# Patient Record
Sex: Female | Born: 1950 | Race: White | Hispanic: No | Smoking: Former smoker
Health system: Southern US, Community
[De-identification: ages and names within clinical notes are randomized; demographics above are authoritative.]

## PROBLEM LIST (undated history)

## (undated) DIAGNOSIS — F329 Major depressive disorder, single episode, unspecified: Secondary | ICD-10-CM

## (undated) DIAGNOSIS — I499 Cardiac arrhythmia, unspecified: Secondary | ICD-10-CM

## (undated) DIAGNOSIS — R51 Headache: Secondary | ICD-10-CM

## (undated) DIAGNOSIS — G8929 Other chronic pain: Secondary | ICD-10-CM

## (undated) DIAGNOSIS — H9190 Unspecified hearing loss, unspecified ear: Secondary | ICD-10-CM

## (undated) DIAGNOSIS — E119 Type 2 diabetes mellitus without complications: Secondary | ICD-10-CM

## (undated) DIAGNOSIS — F32A Depression, unspecified: Secondary | ICD-10-CM

## (undated) DIAGNOSIS — K219 Gastro-esophageal reflux disease without esophagitis: Secondary | ICD-10-CM

## (undated) DIAGNOSIS — F419 Anxiety disorder, unspecified: Secondary | ICD-10-CM

## (undated) DIAGNOSIS — R519 Headache, unspecified: Secondary | ICD-10-CM

## (undated) DIAGNOSIS — K589 Irritable bowel syndrome without diarrhea: Secondary | ICD-10-CM

## (undated) HISTORY — DX: Headache, unspecified: R51.9

## (undated) HISTORY — DX: Irritable bowel syndrome without diarrhea: K58.9

## (undated) HISTORY — DX: Type 2 diabetes mellitus without complications: E11.9

## (undated) HISTORY — PX: CHOLECYSTECTOMY: SHX55

## (undated) HISTORY — DX: Headache: R51

## (undated) HISTORY — DX: Gastro-esophageal reflux disease without esophagitis: K21.9

## (undated) HISTORY — DX: Major depressive disorder, single episode, unspecified: F32.9

## (undated) HISTORY — DX: Cardiac arrhythmia, unspecified: I49.9

## (undated) HISTORY — DX: Other chronic pain: G89.29

## (undated) HISTORY — DX: Depression, unspecified: F32.A

## (undated) HISTORY — PX: ABDOMINAL HYSTERECTOMY: SUR658

## (undated) HISTORY — DX: Unspecified hearing loss, unspecified ear: H91.90

## (undated) HISTORY — DX: Anxiety disorder, unspecified: F41.9

---

## 1984-01-20 DIAGNOSIS — K589 Irritable bowel syndrome without diarrhea: Secondary | ICD-10-CM

## 1984-01-20 HISTORY — DX: Irritable bowel syndrome, unspecified: K58.9

## 2010-02-21 ENCOUNTER — Emergency Department (HOSPITAL_COMMUNITY)
Admission: EM | Admit: 2010-02-21 | Discharge: 2010-02-21 | Disposition: A | Discharge status: Home / Self Care | Payer: Self-pay | Attending: Emergency Medicine | Admitting: Emergency Medicine

## 2010-02-21 DIAGNOSIS — R059 Cough, unspecified: Secondary | ICD-10-CM | POA: Insufficient documentation

## 2010-02-21 DIAGNOSIS — H5789 Other specified disorders of eye and adnexa: Secondary | ICD-10-CM | POA: Insufficient documentation

## 2010-02-21 DIAGNOSIS — Z8673 Personal history of transient ischemic attack (TIA), and cerebral infarction without residual deficits: Secondary | ICD-10-CM | POA: Insufficient documentation

## 2010-02-21 DIAGNOSIS — J329 Chronic sinusitis, unspecified: Secondary | ICD-10-CM | POA: Insufficient documentation

## 2010-02-21 DIAGNOSIS — J3489 Other specified disorders of nose and nasal sinuses: Secondary | ICD-10-CM | POA: Insufficient documentation

## 2010-02-21 DIAGNOSIS — H1130 Conjunctival hemorrhage, unspecified eye: Secondary | ICD-10-CM | POA: Insufficient documentation

## 2010-02-21 DIAGNOSIS — R05 Cough: Secondary | ICD-10-CM | POA: Insufficient documentation

## 2011-04-03 ENCOUNTER — Encounter: Payer: Self-pay | Admitting: Internal Medicine

## 2011-04-06 ENCOUNTER — Encounter: Payer: Self-pay | Admitting: Internal Medicine

## 2011-04-07 ENCOUNTER — Encounter: Payer: Self-pay | Admitting: Internal Medicine

## 2011-04-08 ENCOUNTER — Encounter: Payer: Self-pay | Admitting: Internal Medicine

## 2011-04-08 ENCOUNTER — Ambulatory Visit (INDEPENDENT_AMBULATORY_CARE_PROVIDER_SITE_OTHER): Payer: Medicare Other | Admitting: Internal Medicine

## 2011-04-08 DIAGNOSIS — R1030 Lower abdominal pain, unspecified: Secondary | ICD-10-CM

## 2011-04-08 DIAGNOSIS — K219 Gastro-esophageal reflux disease without esophagitis: Secondary | ICD-10-CM

## 2011-04-08 DIAGNOSIS — R198 Other specified symptoms and signs involving the digestive system and abdomen: Secondary | ICD-10-CM

## 2011-04-08 DIAGNOSIS — R519 Headache, unspecified: Secondary | ICD-10-CM | POA: Insufficient documentation

## 2011-04-08 DIAGNOSIS — K921 Melena: Secondary | ICD-10-CM

## 2011-04-08 DIAGNOSIS — Z90710 Acquired absence of both cervix and uterus: Secondary | ICD-10-CM | POA: Insufficient documentation

## 2011-04-08 DIAGNOSIS — R194 Change in bowel habit: Secondary | ICD-10-CM

## 2011-04-08 DIAGNOSIS — R109 Unspecified abdominal pain: Secondary | ICD-10-CM

## 2011-04-08 DIAGNOSIS — R1013 Epigastric pain: Secondary | ICD-10-CM

## 2011-04-08 HISTORY — DX: Headache, unspecified: R51.9

## 2011-04-08 MED ORDER — PEG-KCL-NACL-NASULF-NA ASC-C 100 G PO SOLR: 1.0000 | Freq: Once | ORAL | Status: DC

## 2011-04-08 MED ORDER — HYOSCYAMINE SULFATE 0.125 MG SL SUBL: 0.1250 mg | SUBLINGUAL_TABLET | SUBLINGUAL | Status: DC | PRN

## 2011-04-08 NOTE — Patient Instructions (Addendum)
You have been given a separate informational sheet regarding your tobacco use, the importance of quitting and local resources to help you quit.  You have been scheduled for a colonoscopy/endoscopy with propofol. Please follow written instructions given to you at your visit today.  Please pick up your prep kit at the pharmacy within the next 1-3 days.  You have been scheduled for a CT scan of the abdomen and pelvis at [facility] ([address]---this is in the same building as [facility]).   You are scheduled on 04/13/2011 at 10:00am. You should arrive 15 minutes prior to your appointment time for registration. Please follow the written instructions below on the day of your exam:  WARNING: IF YOU ARE ALLERGIC TO IODINE/X-RAY DYE, PLEASE NOTIFY RADIOLOGY IMMEDIATELY AT [phone]! YOU WILL BE GIVEN A 13 HOUR PREMEDICATION PREP.  1) Do not eat or drink anything after 5:00am (4 hours prior to your test) 2) You have been given 2 bottles of oral contrast to drink. The solution may taste better if refrigerated, but do NOT add ice or any other liquid to this solution. Shake well before drinking.    Drink 1 bottle of contrast @ 8:00am (2 hours prior to your exam)  Drink 1 bottle of contrast @ 9:00am (1 hour prior to your exam)  You may take any medications as prescribed with a small amount of water except for the following: Metformin, Glucophage, Glucovance, Avandamet, Riomet, Fortamet, Actoplus Met, Janumet, Glumetza or Metaglip. The above medications must be held the day of the exam AND 48 hours after the exam.  The purpose of you drinking the oral contrast is to aid in the visualization of your intestinal tract. The contrast solution may cause some diarrhea. Before your exam is started, you will be given a small amount of fluid to drink. Depending on your individual set of symptoms, you may also receive an intravenous injection of x-ray contrast/dye. Plan on being at [facility] for 30 minutes or long, depending on the type of exam you are having performed.  If you have any questions regarding your exam or if you need to reschedule, you may call the CT department at [phone] between the hours of 8:00 am and 5:00 pm, Monday-Friday.  We have sent the following medications to your pharmacy for you to pick up at your convenience: Levsin   ________________________________________________________________________

## 2011-04-08 NOTE — Progress Notes (Signed)
Subjective:    Patient ID: Rhonda Fields, female    DOB: [birth year], 61 y.o.   MRN: [id number]  HPI 61 yo female with PMH of TAH/SBO who is seen in consultation at the request of Dr. Tanya Nones for evaluation of abdominal pain and hematochezia.  The patient reports developing crampy middle and lower abdominal pain over the last 4-6 weeks. This has been associated with a change in bowel habits and rectal bleeding. She reports having frequent, soft stools occurring almost after every meal. This seems to be happening 6-8 times daily, but happens less frequently if she is not eating. She reports frequent red blood and occasionally clots nurse stool. She is seen red blood on the toilet tissue as well. She reports increased lower gas and some fecal seepage. She reports this has limited her desire to leave her home.  Prior to the symptoms she was having one bowel movement every other day. Regarding the abdominal cramping, it is worse after eating and occasionally wakes her up at night. She is also having an epigastric pain which radiates into her chest. She describes this as a burning sensation, again worse at night. This was initially treated with Dexilant, which she said helped a lot, however Medicaid would not pay for this medication.  She was subsequently changed to omeprazole 40 mg daily, and this seems to lose its efficacy in the evening and night.  No dysphagia or odynophagia. No fevers or chills. Appetite comes and goes.  She reports a recent history of being diagnosed with H. pylori, presumably by serum testing, and completed 7 days of antibiotic therapy. She also underwent rectal exam with anoscopy by her PCP which was reportedly negative for hemorrhoids, fissures.  Review of Systems  as per history of present illness, also positive for anxiety, fatigue, back pain, occasional headaches, occasional muscle cramps, difficulty with sleep, urinary frequency  Past Medical History  Diagnosis Date  .  Anxiety   . Arrhythmia   . Chronic headaches   . Depression   . IBS (irritable bowel syndrome) 1986   Past Surgical History  Procedure Date  . Possible Cholecystectomy   . Abdominal hysterectomy    Current Outpatient Prescriptions  Medication Sig Dispense Refill  . omeprazole (PRILOSEC) 40 MG capsule Take 40 mg by mouth daily.      . hyoscyamine (LEVSIN/SL) 0.125 MG SL tablet Place 1 tablet (0.125 mg total) under the tongue every 4 (four) hours as needed for cramping.  30 tablet  2  . peg 3350 powder (MOVIPREP) 100 G SOLR Take 1 kit (100 g total) by mouth once.  1 kit  0   Allergies  Allergen Reactions  . Penicillins    Family History  Problem Relation Age of Onset  . Diabetes      Siblings, both sets of grandparents  . Liver disease Mother   . Liver disease Brother   . Kidney disease Mother    Social History  . Marital Status: Widowed    Number of Children: 4   Occupational History  . Unemployed    Social History Main Topics  . Smoking status: Current Everyday Smoker    Types: Cigarettes  . Smokeless tobacco: Never Used  . Alcohol Use: No  . Drug Use: No      Objective:   Physical Exam BP 138/90  Pulse 88  Ht 5' 3.5" (1.613 m)  Wt 161 lb 6.4 oz (73.211 kg)  BMI 28.14 kg/m2 Constitutional: Well-developed and well-nourished. No distress. HEENT:  Normocephalic and atraumatic. Oropharynx is clear and moist. No oropharyngeal exudate. Conjunctivae are normal. Pupils are equal round and reactive to light. No scleral icterus. Neck: Neck supple. Trachea midline. Cardiovascular: Normal rate, regular rhythm and intact distal pulses. No M/R/G Pulmonary/chest: Effort normal and breath sounds normal. No wheezing, rales or rhonchi. Abdominal: Soft, obese, midline abdominal scar from xiphoid to suprapubic abdomen well-healed, tender to palpation epigastrium and across the middle abdomen without rebound or guarding, nondistended. Bowel sounds active throughout. There are no  masses palpable. No hepatosplenomegaly. Extremities: no clubbing, cyanosis, or edema Lymphadenopathy: No cervical adenopathy noted. Neurological: Alert and oriented to person place and time. Skin: Skin is warm and dry. No rashes noted. Psychiatric: Normal mood and affect. Behavior is normal.  Labs date 03/28/2011 Sedimentation rate 5, B12 471  Labs date 03/20/2011 CBC: WBC 10.5, hemoglobin 14.7, hematocrit 42.4, MCV 90.0, platelet 286 Sodium 138, potassium 3.9, chloride 105, carbon dioxide 20, BUN 6, creatinine 0.74, glucose 191 Total bili 0.4, alkaline phosphatase 104, AST 68, ALT 76 Total protein 7.2, albumin 4.2, calcium 8.9, TSH 0.802 (WNL)    Assessment & Plan:  61 yo female with PMH of TAH/SBO who is seen in consultation at the request of Dr. Tanya Nones for evaluation of abdominal pain and hematochezia  1. Abd pain/hematochezia/change in bowel habits/GERD -- the patient has had a dramatic change in bowel habits with frequent rectal bleeding over the last 4-6 weeks. She apparently has upper epigastric type pain which may be acid related, but also lower cramping type abdominal pain. Her labs, which I reviewed, are reassuring, though she did have mild elevations in his transaminases. She does not recall cross-sectional imaging of her liver, but perhaps this is secondary to fatty liver disease. For now I recommend CT scan abdomen and pelvis with contrast to better evaluate her current symptoms. Then I feel she should proceed with EGD and colonoscopy to evaluate her epigastric pain, lower abdominal pain, and hematochezia. Inflammatory bowel disease is in the differential, and colonoscopy should help sort this out. We discussed these tests including the risks, benefits, and alternatives, and she is agreeable to proceed. They will be scheduled for her with propofol. In the meantime, I would like to add a second daily dose of omeprazole 40 mg. He does seem she is having late day symptoms, and perhaps  needs twice daily PPI. Also will prescribe Levsin to be used as needed and as directed for abdominal pain/spasm. I have asked that she contact us if symptoms worsen as she is waiting on her procedure.

## 2011-04-09 ENCOUNTER — Telehealth: Payer: Self-pay | Admitting: Internal Medicine

## 2011-04-09 NOTE — Telephone Encounter (Signed)
Pt reports the co pay for Movi Prep is >$65 and she can't afford it; hyoscyamine is $28. Informed pt the $28 is quite low got a GI med and if  I could find her a Movi Prep can she pay the $28? Pt stated yes and sample left at the front desk.

## 2011-04-13 ENCOUNTER — Ambulatory Visit (INDEPENDENT_AMBULATORY_CARE_PROVIDER_SITE_OTHER)
Admission: RE | Admit: 2011-04-13 | Discharge: 2011-04-13 | Disposition: A | Discharge status: Home / Self Care | Payer: Medicare Other | Source: Ambulatory Visit | Attending: Internal Medicine | Admitting: Internal Medicine

## 2011-04-13 ENCOUNTER — Other Ambulatory Visit: Payer: Self-pay | Admitting: Internal Medicine

## 2011-04-13 DIAGNOSIS — K219 Gastro-esophageal reflux disease without esophagitis: Secondary | ICD-10-CM

## 2011-04-13 DIAGNOSIS — K921 Melena: Secondary | ICD-10-CM

## 2011-04-13 DIAGNOSIS — R109 Unspecified abdominal pain: Secondary | ICD-10-CM

## 2011-04-13 MED ORDER — IOHEXOL 300 MG/ML  SOLN
100.0000 mL | Freq: Once | INTRAMUSCULAR | Status: AC | PRN
  Administered 2011-04-13: 100 mL via INTRAVENOUS

## 2011-04-14 ENCOUNTER — Other Ambulatory Visit: Payer: Self-pay | Admitting: Gastroenterology

## 2011-04-14 ENCOUNTER — Telehealth: Payer: Self-pay

## 2011-04-14 DIAGNOSIS — K8020 Calculus of gallbladder without cholecystitis without obstruction: Secondary | ICD-10-CM

## 2011-04-14 NOTE — Telephone Encounter (Signed)
Pt scheduled for ERCP with [facility] with Dr. Arlyce Dice 04/23/11 @1pm . Case [phone]. Pt to have no solid food or milk after 12mn. Pt to have clear liquids after midnight, may have clear liquids until 9am 04/23/11.

## 2011-04-15 ENCOUNTER — Telehealth: Payer: Self-pay | Admitting: Gastroenterology

## 2011-04-15 ENCOUNTER — Telehealth: Payer: Self-pay

## 2011-04-15 ENCOUNTER — Other Ambulatory Visit: Payer: Self-pay | Admitting: Internal Medicine

## 2011-04-15 NOTE — Telephone Encounter (Signed)
Pt could not take the appt at [facility] for ERCP. Pt scheduled at [facility] 04/20/11 in the OR at 2:30pm for ERCP with [facility]. Case [phone]. Scheduled with Meriam Sprague in the OR.

## 2011-04-15 NOTE — Telephone Encounter (Signed)
Pt had questions her daughter asked about the procedure. Answered questions on exactly what Dr Arlyce Dice was doing , where the stone is, why the COLON isn't being done on the same day; etc. Pt stated understanding.

## 2011-04-16 ENCOUNTER — Encounter (HOSPITAL_COMMUNITY): Payer: Self-pay | Admitting: Pharmacy Technician

## 2011-04-20 ENCOUNTER — Encounter (HOSPITAL_COMMUNITY): Payer: Self-pay | Admitting: Anesthesiology

## 2011-04-20 ENCOUNTER — Ambulatory Visit (HOSPITAL_COMMUNITY)
Admission: RE | Admit: 2011-04-20 | Discharge: 2011-04-20 | Disposition: A | Discharge status: Home / Self Care | Payer: Medicare Other | Source: Ambulatory Visit | Attending: Gastroenterology | Admitting: Gastroenterology

## 2011-04-20 ENCOUNTER — Encounter (HOSPITAL_COMMUNITY)
Admission: RE | Disposition: A | Discharge status: Home / Self Care | Payer: Self-pay | Source: Ambulatory Visit | Attending: Gastroenterology

## 2011-04-20 ENCOUNTER — Encounter (HOSPITAL_COMMUNITY): Payer: Self-pay | Admitting: Gastroenterology

## 2011-04-20 ENCOUNTER — Encounter (HOSPITAL_COMMUNITY): Payer: Self-pay | Admitting: *Deleted

## 2011-04-20 ENCOUNTER — Other Ambulatory Visit: Payer: Self-pay

## 2011-04-20 ENCOUNTER — Ambulatory Visit (HOSPITAL_COMMUNITY): Payer: Medicare Other | Admitting: *Deleted

## 2011-04-20 ENCOUNTER — Ambulatory Visit (HOSPITAL_COMMUNITY): Payer: Medicare Other

## 2011-04-20 DIAGNOSIS — K589 Irritable bowel syndrome without diarrhea: Secondary | ICD-10-CM | POA: Insufficient documentation

## 2011-04-20 DIAGNOSIS — R932 Abnormal findings on diagnostic imaging of liver and biliary tract: Secondary | ICD-10-CM

## 2011-04-20 DIAGNOSIS — Z79899 Other long term (current) drug therapy: Secondary | ICD-10-CM | POA: Insufficient documentation

## 2011-04-20 DIAGNOSIS — R1013 Epigastric pain: Secondary | ICD-10-CM

## 2011-04-20 DIAGNOSIS — K219 Gastro-esophageal reflux disease without esophagitis: Secondary | ICD-10-CM | POA: Insufficient documentation

## 2011-04-20 DIAGNOSIS — K8020 Calculus of gallbladder without cholecystitis without obstruction: Secondary | ICD-10-CM

## 2011-04-20 DIAGNOSIS — Z01810 Encounter for preprocedural cardiovascular examination: Secondary | ICD-10-CM | POA: Insufficient documentation

## 2011-04-20 DIAGNOSIS — K8050 Calculus of bile duct without cholangitis or cholecystitis without obstruction: Secondary | ICD-10-CM

## 2011-04-20 HISTORY — PX: ERCP: SHX5425

## 2011-04-20 SURGERY — ERCP, WITH INTERVENTION IF INDICATED
Anesthesia: Monitor Anesthesia Care

## 2011-04-20 SURGERY — ERCP, WITH INTERVENTION IF INDICATED
Anesthesia: General

## 2011-04-20 MED ORDER — SODIUM CHLORIDE 0.9 % IV SOLN
INTRAVENOUS | Status: DC | PRN
  Administered 2011-04-20: 17:00:00

## 2011-04-20 MED ORDER — ONDANSETRON HCL 4 MG/2ML IJ SOLN
INTRAMUSCULAR | Status: DC | PRN
  Administered 2011-04-20 (×2): 2 mg via INTRAVENOUS

## 2011-04-20 MED ORDER — MIDAZOLAM HCL 5 MG/5ML IJ SOLN
INTRAMUSCULAR | Status: DC | PRN
  Administered 2011-04-20 (×2): 1 mg via INTRAVENOUS

## 2011-04-20 MED ORDER — NEOSTIGMINE METHYLSULFATE 1 MG/ML IJ SOLN
INTRAMUSCULAR | Status: DC | PRN
  Administered 2011-04-20: 1 mg via INTRAVENOUS

## 2011-04-20 MED ORDER — CIPROFLOXACIN IN D5W 400 MG/200ML IV SOLN: 400.0000 mg | Freq: Once | INTRAVENOUS | Status: DC

## 2011-04-20 MED ORDER — CISATRACURIUM BESYLATE 2 MG/ML IV SOLN
INTRAVENOUS | Status: DC | PRN
  Administered 2011-04-20: 5 mg via INTRAVENOUS

## 2011-04-20 MED ORDER — CIPROFLOXACIN IN D5W 400 MG/200ML IV SOLN
INTRAVENOUS | Status: DC | PRN
  Administered 2011-04-20: 100 mg via INTRAVENOUS

## 2011-04-20 MED ORDER — PROPOFOL 10 MG/ML IV EMUL
INTRAVENOUS | Status: DC | PRN
  Administered 2011-04-20: 175 mg via INTRAVENOUS
  Administered 2011-04-20: 25 mg via INTRAVENOUS

## 2011-04-20 MED ORDER — SUCCINYLCHOLINE CHLORIDE 20 MG/ML IJ SOLN
INTRAMUSCULAR | Status: DC | PRN
  Administered 2011-04-20: 100 mg via INTRAVENOUS

## 2011-04-20 MED ORDER — GLYCOPYRROLATE 0.2 MG/ML IJ SOLN
INTRAMUSCULAR | Status: DC | PRN
  Administered 2011-04-20: 0.2 mg via INTRAVENOUS

## 2011-04-20 MED ORDER — LIDOCAINE HCL (CARDIAC) 20 MG/ML IV SOLN
INTRAVENOUS | Status: DC | PRN
  Administered 2011-04-20: 50 mg via INTRAVENOUS

## 2011-04-20 MED ORDER — FENTANYL CITRATE 0.05 MG/ML IJ SOLN: 25.0000 ug | INTRAMUSCULAR | Status: DC | PRN

## 2011-04-20 MED ORDER — LACTATED RINGERS IV SOLN: INTRAVENOUS | Status: DC

## 2011-04-20 MED ORDER — CIPROFLOXACIN IN D5W 400 MG/200ML IV SOLN
INTRAVENOUS | Status: AC
  Filled 2011-04-20: qty 200

## 2011-04-20 MED ORDER — LACTATED RINGERS IV SOLN
INTRAVENOUS | Status: DC | PRN
  Administered 2011-04-20 (×2): via INTRAVENOUS

## 2011-04-20 MED ORDER — LACTATED RINGERS IV SOLN
INTRAVENOUS | Status: DC
  Administered 2011-04-20: 1000 mL via INTRAVENOUS

## 2011-04-20 MED ORDER — DEXAMETHASONE SODIUM PHOSPHATE 10 MG/ML IJ SOLN
INTRAMUSCULAR | Status: DC | PRN
  Administered 2011-04-20: 10 mg via INTRAVENOUS

## 2011-04-20 MED ORDER — DIPHENHYDRAMINE HCL 50 MG/ML IJ SOLN
INTRAMUSCULAR | Status: DC | PRN
  Administered 2011-04-20: 25 mg via INTRAVENOUS

## 2011-04-20 MED ORDER — FENTANYL CITRATE 0.05 MG/ML IJ SOLN
INTRAMUSCULAR | Status: DC | PRN
  Administered 2011-04-20: 100 ug via INTRAVENOUS

## 2011-04-20 MED ORDER — PROMETHAZINE HCL 25 MG/ML IJ SOLN: 6.2500 mg | INTRAMUSCULAR | Status: DC | PRN

## 2011-04-20 NOTE — Anesthesia Preprocedure Evaluation (Signed)
Anesthesia Evaluation  Patient identified by MRN, date of birth, ID band Patient awake    Reviewed: Allergy & Precautions, H&P , NPO status , Patient's Chart, lab work & pertinent test results  History of Anesthesia Complications Negative for: history of anesthetic complications  Airway Mallampati: II TM Distance: >3 FB Neck ROM: Full    Dental  (+) Edentulous Upper and Edentulous Lower   Pulmonary asthma , Current Smoker,  breath sounds clear to auscultation  Pulmonary exam normal       Cardiovascular negative cardio ROS  + dysrhythmias + Valvular Problems/Murmurs Rhythm:Regular Rate:Normal     Neuro/Psych  Headaches, Anxiety Depression negative neurological ROS     GI/Hepatic negative GI ROS, Neg liver ROS,   Endo/Other  negative endocrine ROS  Renal/GU negative Renal ROS  negative genitourinary   Musculoskeletal negative musculoskeletal ROS (+)   Abdominal   Peds negative pediatric ROS (+)  Hematology negative hematology ROS (+)   Anesthesia Other Findings   Reproductive/Obstetrics negative OB ROS                           Anesthesia Physical Anesthesia Plan  ASA: II  Anesthesia Plan: General   Post-op Pain Management:    Induction: Intravenous  Airway Management Planned: Oral ETT  Additional Equipment:   Intra-op Plan:   Post-operative Plan: Extubation in OR  Informed Consent: I have reviewed the patients History and Physical, chart, labs and discussed the procedure including the risks, benefits and alternatives for the proposed anesthesia with the patient or authorized representative who has indicated his/her understanding and acceptance.   Dental advisory given  Plan Discussed with: CRNA  Anesthesia Plan Comments:         Anesthesia Quick Evaluation

## 2011-04-20 NOTE — Interval H&P Note (Signed)
History and Physical Interval Note:  04/20/2011 3:11 PM  Tekila Caillouet  has presented today for surgery, with the diagnosis of abdominal pain vomiting  The various methods of treatment have been discussed with the patient and family. After consideration of risks, benefits and other options for treatment, the patient has consented to  Procedure(s) (LRB): ENDOSCOPIC RETROGRADE CHOLANGIOPANCREATOGRAPHY (ERCP) (N/A) as a surgical intervention .  The patients' history has been reviewed, patient examined, no change in status, stable for surgery.  I have reviewed the patients' chart and labs.  Questions were answered to the patient's satisfaction.    The recent H&P (dated *04/08/11**) was reviewed, the patient was examined and there is no change in the patients condition since that H&P was completed.  CT scan demonstrated dilatation of the distal common bile duct with a distal common bile duct stone.  Plan ERCP The risks, benefits, and possible complications of the procedure, including bleeding, perforation, surgery, and the 5-10% risk for pancreatitis, were explained to the patient.  Patient's questions were answered.    [name]  04/20/2011, 3:12 PM    [name]

## 2011-04-20 NOTE — Transfer of Care (Signed)
Immediate Anesthesia Transfer of Care Note  Patient: Rhonda Fields  Procedure(s) Performed: Procedure(s) (LRB): ENDOSCOPIC RETROGRADE CHOLANGIOPANCREATOGRAPHY (ERCP) (N/A)  Patient Location: PACU  Anesthesia Type: General  Level of Consciousness: awake, oriented and patient cooperative  Airway & Oxygen Therapy: Patient Spontanous Breathing and Patient connected to face mask oxygen  Post-op Assessment: Report given to PACU RN, Post -op Vital signs reviewed and stable and Patient moving all extremities  Post vital signs: Reviewed and stable  Complications: No apparent anesthesia complications

## 2011-04-20 NOTE — Discharge Instructions (Signed)
Endoscopy Care After Please read the instructions outlined below and refer to this sheet in the next few weeks. These discharge instructions provide you with general information on caring for yourself after you leave the hospital. Your doctor may also give you specific instructions. While your treatment has been planned according to the most current medical practices available, unavoidable complications occasionally occur. If you have any problems or questions after discharge, please call your doctor. HOME CARE INSTRUCTIONS Activity  You may resume your regular activity but move at a slower pace for the next 24 hours.   Take frequent rest periods for the next 24 hours.   Walking will help expel (get rid of) the air and reduce the bloated feeling in your abdomen.   No driving for 24 hours (because of the anesthesia (medicine) used during the test).   You may shower.   Do not sign any important legal documents or operate any machinery for 24 hours (because of the anesthesia used during the test).  Nutrition  Drink plenty of fluids.   You may resume your normal diet.   Begin with a light meal and progress to your normal diet.   Avoid alcoholic beverages for 24 hours or as instructed by your caregiver.  Medications You may resume your normal medications unless your caregiver tells you otherwise. What you can expect today  You may experience abdominal discomfort such as a feeling of fullness or "gas" pains.   You may experience a sore throat for 2 to 3 days. This is normal. Gargling with salt water may help this.  Follow-up Your doctor will discuss the results of your test with you. SEEK IMMEDIATE MEDICAL CARE IF:  You have excessive nausea (feeling sick to your stomach) and/or vomiting.   You have severe abdominal pain and distention (swelling).   You have trouble swallowing.   You have a temperature over 100 F (37.8 C).   You have rectal bleeding or vomiting of blood.    Document Released: 08/20/2003 Document Revised: 12/25/2010 Document Reviewed: 03/02/2007 [facility] Patient Information 2012 [place], [place].Endoscopic Retrograde Cholangiopancreatography This is a test used to evaluate people with jaundice (a condition in which the person's skin is turning yellow because of the high level of bilirubin in your blood). Bilirubin is a product of the blood which is elevated when an obstruction in the bile duct occurs. The bile ducts are the pipe-like system which carry the bile from the liver to the gallbladder and out into your small bowel. In this test, a fiber optic endoscope (a small pencil-sized telescope) is inserted and a catheter is put into ducts for examination. This test can reveal stones, strictures, cysts, tumors, and other irregularities within the pancreatic ducts and bile ducts. PREPARATION FOR TEST Do not eat or drink after midnight the day before the test.  NORMAL FINDINGS Normal size of biliary and pancreatic ducts. No obstruction or filling defects within the biliary or pancreatic ducts. Ranges for normal findings may vary among different laboratories and hospitals. You should always check with your doctor after having lab work or other tests done to discuss the meaning of your test results and whether your values are considered within normal limits. MEANING OF TEST  Your caregiver will go over the test results with you and discuss the importance and meaning of your results, as well as treatment options and the need for additional tests if necessary. OBTAINING THE TEST RESULTS  It is your responsibility to obtain your test results. Ask the  lab or department performing the test when and how you will get your results. Document Released: 05/01/2004 Document Revised: 09/17/2010 Document Reviewed: 12/16/2007 [facility] Patient Information 2012 [place], [place].

## 2011-04-20 NOTE — Interval H&P Note (Signed)
History and Physical Interval Note:  04/20/2011 2:59 PM  Rhonda Fields  has presented today for surgery, with the diagnosis of abdominal pain vomiting  The various methods of treatment have been discussed with the patient and family. After consideration of risks, benefits and other options for treatment, the patient has consented to  Procedure(s) (LRB): ENDOSCOPIC RETROGRADE CHOLANGIOPANCREATOGRAPHY (ERCP) (N/A) as a surgical intervention .  The patients' history has been reviewed, patient examined, no change in status, stable for surgery.  I have reviewed the patients' chart and labs.  Questions were answered to the patient's satisfaction.   CT shows CBD dilitation and a distal CBD stone. Plan ERCP for suspected CBD stone  Rhonda Fields

## 2011-04-20 NOTE — Anesthesia Postprocedure Evaluation (Signed)
Anesthesia Post Note  Patient: Rhonda Fields  Procedure(s) Performed: Procedure(s) (LRB): ENDOSCOPIC RETROGRADE CHOLANGIOPANCREATOGRAPHY (ERCP) (N/A)  Anesthesia type: General  Patient location: PACU  Post pain: Pain level controlled  Post assessment: Post-op Vital signs reviewed  Last Vitals:  Filed Vitals:   04/20/11 1715  BP: 133/74  Pulse: 76  Temp:   Resp: 12    Post vital signs: Reviewed  Level of consciousness: sedated  Complications: No apparent anesthesia complications

## 2011-04-20 NOTE — H&P (View-Only) (Signed)
Subjective:    Patient ID: Rhonda Fields, female    DOB: 1950-12-10, 61 y.o.   MRN: [id number]  HPI 61 yo female with PMH of TAH/SBO who is seen in consultation at the request of Dr. Tanya Nones for evaluation of abdominal pain and hematochezia.  The patient reports developing crampy middle and lower abdominal pain over the last 4-6 weeks. This has been associated with a change in bowel habits and rectal bleeding. She reports having frequent, soft stools occurring almost after every meal. This seems to be happening 6-8 times daily, but happens less frequently if she is not eating. She reports frequent red blood and occasionally clots nurse stool. She is seen red blood on the toilet tissue as well. She reports increased lower gas and some fecal seepage. She reports this has limited her desire to leave her home.  Prior to the symptoms she was having one bowel movement every other day. Regarding the abdominal cramping, it is worse after eating and occasionally wakes her up at night. She is also having an epigastric pain which radiates into her chest. She describes this as a burning sensation, again worse at night. This was initially treated with Dexilant, which she said helped a lot, however Medicaid would not pay for this medication.  She was subsequently changed to omeprazole 40 mg daily, and this seems to lose its efficacy in the evening and night.  No dysphagia or odynophagia. No fevers or chills. Appetite comes and goes.  She reports a recent history of being diagnosed with H. pylori, presumably by serum testing, and completed 7 days of antibiotic therapy. She also underwent rectal exam with anoscopy by her PCP which was reportedly negative for hemorrhoids, fissures.  Review of Systems  as per history of present illness, also positive for anxiety, fatigue, back pain, occasional headaches, occasional muscle cramps, difficulty with sleep, urinary frequency  Past Medical History  Diagnosis Date  .  Anxiety   . Arrhythmia   . Chronic headaches   . Depression   . IBS (irritable bowel syndrome) 1986   Past Surgical History  Procedure Date  . Possible Cholecystectomy   . Abdominal hysterectomy    Current Outpatient Prescriptions  Medication Sig Dispense Refill  . omeprazole (PRILOSEC) 40 MG capsule Take 40 mg by mouth daily.      . hyoscyamine (LEVSIN/SL) 0.125 MG SL tablet Place 1 tablet (0.125 mg total) under the tongue every 4 (four) hours as needed for cramping.  30 tablet  2  . peg 3350 powder (MOVIPREP) 100 G SOLR Take 1 kit (100 g total) by mouth once.  1 kit  0   Allergies  Allergen Reactions  . Penicillins    Family History  Problem Relation Age of Onset  . Diabetes      Siblings, both sets of grandparents  . Liver disease Mother   . Liver disease Brother   . Kidney disease Mother    Social History  . Marital Status: Widowed    Number of Children: 4   Occupational History  . Unemployed    Social History Main Topics  . Smoking status: Current Everyday Smoker    Types: Cigarettes  . Smokeless tobacco: Never Used  . Alcohol Use: No  . Drug Use: No      Objective:   Physical Exam BP 138/90  Pulse 88  Ht 5' 3.5" (1.613 m)  Wt 161 lb 6.4 oz (73.211 kg)  BMI 28.14 kg/m2 Constitutional: Well-developed and well-nourished. No distress. HEENT:  Normocephalic and atraumatic. Oropharynx is clear and moist. No oropharyngeal exudate. Conjunctivae are normal. Pupils are equal round and reactive to light. No scleral icterus. Neck: Neck supple. Trachea midline. Cardiovascular: Normal rate, regular rhythm and intact distal pulses. No M/R/G Pulmonary/chest: Effort normal and breath sounds normal. No wheezing, rales or rhonchi. Abdominal: Soft, obese, midline abdominal scar from xiphoid to suprapubic abdomen well-healed, tender to palpation epigastrium and across the middle abdomen without rebound or guarding, nondistended. Bowel sounds active throughout. There are no  masses palpable. No hepatosplenomegaly. Extremities: no clubbing, cyanosis, or edema Lymphadenopathy: No cervical adenopathy noted. Neurological: Alert and oriented to person place and time. Skin: Skin is warm and dry. No rashes noted. Psychiatric: Normal mood and affect. Behavior is normal.  Labs date 03/28/2011 Sedimentation rate 5, B12 471  Labs date 03/20/2011 CBC: WBC 10.5, hemoglobin 14.7, hematocrit 42.4, MCV 90.0, platelet 286 Sodium 138, potassium 3.9, chloride 105, carbon dioxide 20, BUN 6, creatinine 0.74, glucose 191 Total bili 0.4, alkaline phosphatase 104, AST 68, ALT 76 Total protein 7.2, albumin 4.2, calcium 8.9, TSH 0.802 (WNL)    Assessment & Plan:  61 yo female with PMH of TAH/SBO who is seen in consultation at the request of Dr. Tanya Nones for evaluation of abdominal pain and hematochezia  1. Abd pain/hematochezia/change in bowel habits/GERD -- the patient has had a dramatic change in bowel habits with frequent rectal bleeding over the last 4-6 weeks. She apparently has upper epigastric type pain which may be acid related, but also lower cramping type abdominal pain. Her labs, which I reviewed, are reassuring, though she did have mild elevations in his transaminases. She does not recall cross-sectional imaging of her liver, but perhaps this is secondary to fatty liver disease. For now I recommend CT scan abdomen and pelvis with contrast to better evaluate her current symptoms. Then I feel she should proceed with EGD and colonoscopy to evaluate her epigastric pain, lower abdominal pain, and hematochezia. Inflammatory bowel disease is in the differential, and colonoscopy should help sort this out. We discussed these tests including the risks, benefits, and alternatives, and she is agreeable to proceed. They will be scheduled for her with propofol. In the meantime, I would like to add a second daily dose of omeprazole 40 mg. He does seem she is having late day symptoms, and perhaps  needs twice daily PPI. Also will prescribe Levsin to be used as needed and as directed for abdominal pain/spasm. I have asked that she contact us if symptoms worsen as she is waiting on her procedure.

## 2011-04-20 NOTE — Op Note (Signed)
[facility] [address] [place], [place]  [address]  ERCP PROCEDURE REPORT  PATIENT:  Rhonda Fields, Rhonda Fields  MR#:  [id number] BIRTHDATE:  08/19/1950  GENDER:  female  ENDOSCOPIST:  Barbette Hair. Arlyce Dice, MD ASSISTANT:  Beryle Beams, Technician, Dwain Sarna, RN CGRN  PROCEDURE DATE:  04/20/2011 PROCEDURE:  ERCP with removal of stones, ERCP with sphincterotomy, ERCP with stent placement  INDICATIONS:  suspected stone  MEDICATIONS:   MAC sedation, administered by CRNA, antiobiotics cipro 150mg IV (infustion was stopped after 1/3 had been administered b/c of a local reaction) TOPICAL ANESTHETIC:  DESCRIPTION OF PROCEDURE:   After the risks benefits and alternatives of the procedure were thoroughly explained, informed consent was obtained.  The [id number] ([id number]) endoscope was introduced through the mouth and advanced to the third portion of the duodenum.  The pancreatic duct was successfully cannulated and filled with contrast. Care was taken not to overfill the duct. A faint injection of the pancreatic duct was made. Because of difficulty with initial cannulation of the CBD a 4Fr3cm pigtail pancreatic stent was placed over a 0.29mm guidewire. A dilatation was found. CBD was selectively cannulated with a 0.32mm guidewire following placement of the pancreatic stent. Injection demonstrated diffuse dilitation of the duct. No stones were seen although dye was dilute. A 15mm sphincterotomy was made. A 15mm balloon stone extractor was pulled along the entire length of the CBD and through the papilla. Multiple stone fragments were extracted. This was repeated several times extracting a few more fragments. Final cholangiogram was normal except for dilitation (see image1 and image2).    The scope was then completely withdrawn from the patient and the procedure terminated. <<PROCEDUREIMAGES>>  COMPLICATIONS:  None  ENDOSCOPIC IMPRESSION:Bile duct stone(s) - s/p  sphincterotomy with fragmentation of stones and stone extraction s/p pancreatic stent placement  RECOMMENDATIONS:LFTs in 3 days KUB in 3 days to check for pancreatic stent; if still in place will remove endscopically  ______________________________ Barbette Hair. Arlyce Dice, MD  cc: Dr. Erick Blinks cc: Dr. Lynnea Ferrier  CC:  n. eSIGNED:   Barbette Hair. Nation Cradle at 04/20/2011 04:44 PM  Gilman Schmidt, [phone]

## 2011-04-20 NOTE — Preoperative (Signed)
Beta Blockers   Reason not to administer Beta Blockers:Not Applicable 

## 2011-04-21 ENCOUNTER — Telehealth: Payer: Self-pay | Admitting: *Deleted

## 2011-04-21 ENCOUNTER — Encounter (HOSPITAL_COMMUNITY): Payer: Self-pay | Admitting: Gastroenterology

## 2011-04-21 DIAGNOSIS — Z9889 Other specified postprocedural states: Secondary | ICD-10-CM

## 2011-04-21 NOTE — Telephone Encounter (Signed)
Pt called to report she had her ERCP yesterday and the discharge nurse stated something about an appt and someone would call her. lmom for pt to call back. Per ERCP report, LFT's in 3 days, KUB in 3 days to check for pancreatic stent; if still in place, will remove endoscopically.

## 2011-04-21 NOTE — Telephone Encounter (Signed)
Informed pt of the need for lft's and an kub on 04/23/11; she stated understanding. She had "terrible" pain this am, but took tylenol and Anaspaz sl. She reports just a dull pain now above her navel that covers the l and r sides. Pt denies itching or a temp. She will call for either or go to the ER. I will send this to Dr Arlyce Dice and if he has orders or instructions, I will call her; pt stated understanding.  Dr Arlyce Dice, I assume the stent should fall out naturally, correct? Normal for pt to experience this pain? Thanks.

## 2011-04-22 ENCOUNTER — Ambulatory Visit (HOSPITAL_COMMUNITY): Admission: RE | Admit: 2011-04-22 | Payer: Medicare Other | Source: Ambulatory Visit | Admitting: Gastroenterology

## 2011-04-22 ENCOUNTER — Encounter (HOSPITAL_COMMUNITY): Admission: RE | Payer: Self-pay | Source: Ambulatory Visit

## 2011-04-22 ENCOUNTER — Encounter (HOSPITAL_COMMUNITY): Payer: Self-pay | Admitting: Gastroenterology

## 2011-04-22 SURGERY — ERCP, WITH INTERVENTION IF INDICATED
Anesthesia: Monitor Anesthesia Care

## 2011-04-22 NOTE — Telephone Encounter (Signed)
The patient should not have any symptoms referable to her stent. She should be scheduled for a KUB in the morning. Please be sure to call me with the results.

## 2011-04-22 NOTE — Telephone Encounter (Signed)
Pt reports she feels better today except for increased gas; she hasn't had a BM since Monday. Pt instructed to try Miralax and if needed, she can take 2 doses today if needed.

## 2011-04-23 ENCOUNTER — Ambulatory Visit: Admit: 2011-04-23 | Payer: Self-pay | Admitting: Gastroenterology

## 2011-04-23 ENCOUNTER — Telehealth: Payer: Self-pay | Admitting: *Deleted

## 2011-04-23 ENCOUNTER — Ambulatory Visit: Payer: Medicare Other

## 2011-04-23 ENCOUNTER — Ambulatory Visit (INDEPENDENT_AMBULATORY_CARE_PROVIDER_SITE_OTHER)
Admission: RE | Admit: 2011-04-23 | Discharge: 2011-04-23 | Disposition: A | Discharge status: Home / Self Care | Payer: Medicare Other | Source: Ambulatory Visit | Attending: Gastroenterology | Admitting: Gastroenterology

## 2011-04-23 DIAGNOSIS — K8050 Calculus of bile duct without cholangitis or cholecystitis without obstruction: Secondary | ICD-10-CM

## 2011-04-23 LAB — HEPATIC FUNCTION PANEL
AST: 54 U/L — ABNORMAL HIGH (ref 0–37)
Alkaline Phosphatase: 101 U/L (ref 39–117)
Total Bilirubin: 0.5 mg/dL (ref 0.3–1.2)

## 2011-04-23 SURGERY — ERCP, WITH INTERVENTION IF INDICATED
Anesthesia: Monitor Anesthesia Care

## 2011-04-23 NOTE — Telephone Encounter (Signed)
Message copied by Florene Glen on Thu Apr 23, 2011  1:30 PM ------      Message from: HUNT, [place] R      Created: Thu Apr 23, 2011  1:13 PM       FYI

## 2011-04-23 NOTE — Telephone Encounter (Signed)
Notes Recorded by Lily Lovings, RN on 04/23/2011 at 1:13 PM FYI ------  Notes Recorded by Louis Meckel, MD on 04/23/2011 at 1:11 PM Stent still in pancreatic duct. Needs removal. Check to see if pt is still NPO. If so ask Dr. Rhea Belton to remove it today or tomorrow.

## 2011-04-23 NOTE — Telephone Encounter (Signed)
Notified pt she needs an EGD to remove the Pancreatic Stent; Dr Rhea Belton prefers to do it tomorrow while he is in the hospital this week. Pt states she has no ride this week; she is schedule for COLON on 05/04/11, but I told it is too long to wait. Dr Rhea Belton you do have some openings in [facility] next week. Please advise. Thanks.

## 2011-04-27 ENCOUNTER — Other Ambulatory Visit (INDEPENDENT_AMBULATORY_CARE_PROVIDER_SITE_OTHER): Payer: Medicare Other

## 2011-04-27 ENCOUNTER — Other Ambulatory Visit: Payer: Self-pay | Admitting: Internal Medicine

## 2011-04-27 ENCOUNTER — Ambulatory Visit (INDEPENDENT_AMBULATORY_CARE_PROVIDER_SITE_OTHER)
Admission: RE | Admit: 2011-04-27 | Discharge: 2011-04-27 | Disposition: A | Discharge status: Home / Self Care | Payer: Medicare Other | Source: Ambulatory Visit | Attending: Internal Medicine | Admitting: Internal Medicine

## 2011-04-27 ENCOUNTER — Telehealth: Payer: Self-pay | Admitting: Internal Medicine

## 2011-04-27 DIAGNOSIS — R112 Nausea with vomiting, unspecified: Secondary | ICD-10-CM

## 2011-04-27 DIAGNOSIS — Z9889 Other specified postprocedural states: Secondary | ICD-10-CM

## 2011-04-27 LAB — COMPREHENSIVE METABOLIC PANEL
Alkaline Phosphatase: 116 U/L (ref 39–117)
BUN: 9 mg/dL (ref 6–23)
Glucose, Bld: 143 mg/dL — ABNORMAL HIGH (ref 70–99)
Sodium: 139 mEq/L (ref 135–145)
Total Bilirubin: 0.4 mg/dL (ref 0.3–1.2)
Total Protein: 8 g/dL (ref 6.0–8.3)

## 2011-04-27 LAB — CBC WITH DIFFERENTIAL/PLATELET
Basophils Relative: 1.3 % (ref 0.0–3.0)
Eosinophils Relative: 1.7 % (ref 0.0–5.0)
HCT: 46.6 % — ABNORMAL HIGH (ref 36.0–46.0)
Lymphs Abs: 2.6 10*3/uL (ref 0.7–4.0)
MCV: 91 fl (ref 78.0–100.0)
Monocytes Absolute: 0.5 10*3/uL (ref 0.1–1.0)
Neutro Abs: 8.2 10*3/uL — ABNORMAL HIGH (ref 1.4–7.7)
RBC: 5.12 Mil/uL — ABNORMAL HIGH (ref 3.87–5.11)
WBC: 11.7 10*3/uL — ABNORMAL HIGH (ref 4.5–10.5)

## 2011-04-27 NOTE — Telephone Encounter (Signed)
Notified pt she needs to come in for labs and an XRAY; pt stated understanding.

## 2011-04-27 NOTE — Telephone Encounter (Signed)
Informed pt that per Dr Rhea Belton, her labs look fine; her WBC is a little high, but he doesn't think it's clinically significant considering her Lipase is ok. We can do an XRAY with fluoro to the spot, but he thinks the pancreatic is out of the CBD.  Pt stated she's better; lying down. She's taking in liquids only and not more vomiting. She reports the pain is more than a discomfort that she can tolerate. She agreed to rest today and call tomorrow if no better.

## 2011-04-27 NOTE — Telephone Encounter (Signed)
Pt reports pain and an upset stomach; vomited x 2 last pm and once so far this am. She states her stomach is rumbling and she feels " a pinch " in the area where her gall bladder scar is. She is having BMs , felt warm this am; but doesn't know if she's running a fever.

## 2011-04-28 ENCOUNTER — Other Ambulatory Visit: Payer: Medicare Other | Admitting: Internal Medicine

## 2011-04-29 ENCOUNTER — Ambulatory Visit (INDEPENDENT_AMBULATORY_CARE_PROVIDER_SITE_OTHER): Payer: Medicare Other | Admitting: Nurse Practitioner

## 2011-04-29 ENCOUNTER — Encounter: Payer: Self-pay | Admitting: Nurse Practitioner

## 2011-04-29 VITALS — BP 100/72 | HR 60 | Ht 63.0 in | Wt 158.6 lb

## 2011-04-29 DIAGNOSIS — K8050 Calculus of bile duct without cholangitis or cholecystitis without obstruction: Secondary | ICD-10-CM

## 2011-04-29 DIAGNOSIS — R110 Nausea: Secondary | ICD-10-CM | POA: Insufficient documentation

## 2011-04-29 DIAGNOSIS — R1084 Generalized abdominal pain: Secondary | ICD-10-CM | POA: Insufficient documentation

## 2011-04-29 HISTORY — DX: Generalized abdominal pain: R10.84

## 2011-04-29 MED ORDER — ONDANSETRON HCL 4 MG PO TABS: ORAL_TABLET | ORAL | Status: DC

## 2011-04-29 MED ORDER — TRAMADOL HCL 50 MG PO TABS: 50.0000 mg | ORAL_TABLET | Freq: Four times a day (QID) | ORAL | Status: AC | PRN

## 2011-04-29 NOTE — Patient Instructions (Addendum)
You have been given a separate informational sheet regarding your tobacco use, the importance of quitting and local resources to help you quit.  We have sent prescriptions for Ultram for pain and Zofran for nausea to [facility] [address]  If the Zofran is too expensive we can prescribe Phenergan 12.5 mg.

## 2011-04-29 NOTE — Progress Notes (Signed)
Rhonda Fields [id number] 03-Mar-1950   HISTORY OR PRESENT ILLNESS : Patient is a 61 year old female who saw Dr. Rhea Belton on 3/20 for abdominal pain, elevated LFTs, bowel change, hematochezia. She was scheduled for an EGD /colon and CTscan. CTscan was suspicious for CBD stone, patient had ERCP with stone extraction, sphincterotomy and PD stenting on 04/20/11.  She has had abdominal pain since procedure. Xrays show stent is gone. Lipase normal at 45, no significant changes in mild transaminitis. Her WBC is mildly elevated at 11.7. She describes pain as a "stomach ache", cannot say whether pain is same as before the ERCPShe has associated nausea and was vomiting on Monday but not since then. She is taking two Prilosec daily. Levbid doesn't help. As far as her BM/ bleeding (one of her original problems), she has chronic loose stools and they are at baseline. No significant rectal bleeding but does have some blood tinged fluid if she has more than 3 BMs a day. Patient already on schedule for a colonoscopy.   Current Medications, Allergies, Past Medical History, Past Surgical History, Family History and Social History were reviewed in [employer] record.   PHYSICAL EXAMINATION : General:  Well developed  female in no acute distress Head: Normocephalic and atraumatic Eyes:  sclerae anicteric,conjunctive pink. Ears: Normal auditory acuity Neck: Supple, no masses.  Lungs: Clear throughout to auscultation Heart: Regular rate and rhythm; no murmurs heard Abdomen: Soft, nondistended, diffuse tenderness, greatest in LLQ. Normal bowel sounds Rectal: not done Musculoskeletal: Symmetrical with no gross deformities  Skin: No lesions on visible extremities Extremities: No edema or deformities noted Neurological: Oriented, grossly nonfocal Cervical Nodes:  No significant cervical adenopathy Psychological:  Alert and cooperative. Normal mood and affect  ASSESSMENT AND PLAN :  1.  Nausea, generalized abdominal pain. Etiology not clear. No evidence for post-ERCP pancreatitis. The PD stent is out by imaging. She is for a colonoscopy coming up, will add on an EGD for further evaluation of ongoing nausea and generalized abdominal pain. Further recommendations following endoscopy. The benefits, risks, and potential complications of EGD with possible biopsies and/or dilation were discussed with the patient and she agrees to proceed. For now will try ultram for pain, Zofran for nausea.  2. Choledocholithiasis, resolved. S/P ERCP with stone extraction and sphincterotomy. She had a cholecystectomy many years ago.    She is already scheduled for colon, will add EGD. If these are negative, she may need repeat imaging studies. Her pain will try Ultram, for nausea we'll try Zofran. Zofran is too expensive then we will go with a low dose Phenergan.

## 2011-04-30 ENCOUNTER — Encounter: Payer: Self-pay | Admitting: Nurse Practitioner

## 2011-04-30 NOTE — Progress Notes (Signed)
i agree with the plan outlined in this note 

## 2011-05-04 ENCOUNTER — Encounter: Payer: Self-pay | Admitting: Internal Medicine

## 2011-05-04 ENCOUNTER — Ambulatory Visit (AMBULATORY_SURGERY_CENTER): Payer: Medicare Other | Admitting: Internal Medicine

## 2011-05-04 VITALS — BP 113/57 | HR 78 | Temp 96.2°F | Resp 18 | Ht 63.0 in | Wt 158.0 lb

## 2011-05-04 DIAGNOSIS — K2270 Barrett's esophagus without dysplasia: Secondary | ICD-10-CM

## 2011-05-04 DIAGNOSIS — R1084 Generalized abdominal pain: Secondary | ICD-10-CM

## 2011-05-04 DIAGNOSIS — K2970 Gastritis, unspecified, without bleeding: Secondary | ICD-10-CM

## 2011-05-04 DIAGNOSIS — R1013 Epigastric pain: Secondary | ICD-10-CM

## 2011-05-04 DIAGNOSIS — K2990 Gastroduodenitis, unspecified, without bleeding: Secondary | ICD-10-CM

## 2011-05-04 DIAGNOSIS — D126 Benign neoplasm of colon, unspecified: Secondary | ICD-10-CM

## 2011-05-04 DIAGNOSIS — Z1211 Encounter for screening for malignant neoplasm of colon: Secondary | ICD-10-CM

## 2011-05-04 DIAGNOSIS — R110 Nausea: Secondary | ICD-10-CM

## 2011-05-04 MED ORDER — SODIUM CHLORIDE 0.9 % IV SOLN: 500.0000 mL | INTRAVENOUS | Status: DC

## 2011-05-04 NOTE — Op Note (Signed)
[facility] [address] [place], [place]  [address]  ENDOSCOPY PROCEDURE REPORT  Fields:  Rhonda, Fields  MR#:  [id number] BIRTHDATE:  1950/04/03, 60 yrs. old  GENDER:  female ENDOSCOPIST:  Carie Caddy. Peggie Hornak, MD Referred by:  Lynnea Ferrier, M.D. PROCEDURE DATE:  05/04/2011 PROCEDURE:  EGD with biopsy for H. pylori 62130, EGD with biopsy for possible Barrett's esophagus ASA CLASS:  Class III INDICATIONS:  epigastric pain, nausea MEDICATIONS:   MAC sedation, administered by CRNA, propofol (Diprivan) 150 mg IV TOPICAL ANESTHETIC:  none  DESCRIPTION OF PROCEDURE:   After Rhonda risks benefits and alternatives of Rhonda procedure were thoroughly explained, informed consent was obtained.  Rhonda LB [id number] endoscope was introduced through Rhonda mouth and advanced to Rhonda second portion of Rhonda duodenum, without limitations.  Rhonda instrument was slowly withdrawn as Rhonda mucosa was fully examined. <<PROCEDUREIMAGES>>  There were columnar-type mucosal changes in Rhonda distal esophagus from 30 cm to 33 cm, that could represent Barrett's esophagus. Multiple biopsies were obtained and sent to pathology.  Otherwise normal esophagus.  A small hiatal hernia was found.  Mild gastritis was found in Rhonda body and Rhonda antrum of Rhonda stomach. Multiple biopsies were obtained and sent to pathology.  Rhonda duodenal bulb was normal in appearance, as was Rhonda postbulbar duodenum.    Retroflexed views revealed findings as previously described.    Rhonda scope was then withdrawn from Rhonda Fields and Rhonda procedure completed.  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  Multiple biopsies performed. 2) Otherwise normal esophagus 3) Hiatal hernia 4) Mild gastritis in Rhonda body and Rhonda antrum of Rhonda stomach. Multiple biopsies performed. 5) Normal duodenum  RECOMMENDATIONS: 1) Await pathology results 2) Continue current medications including  omeprazole. 3) Follow-up of helicobacter pylori status, treat if indicated 4) Need for repeat EGD will be based on pathology results.  Carie Caddy. Rhea Belton, MD  CC:  Lynnea Ferrier, MD Rhonda Fields  n. eSIGNED:   Carie Caddy. Demian Maisel at 05/04/2011 12:29 PM  Gilman Schmidt, [phone]

## 2011-05-04 NOTE — Progress Notes (Signed)
Pt states that she is feeling nauseated.  Has felt that way since taking prep.  Emesis basin and cool cloth given to patient.

## 2011-05-04 NOTE — Progress Notes (Signed)
Patient did not have preoperative order for IV antibiotic SSI prophylaxis. (G8918)  Patient did not experience any of the following events: a burn prior to discharge; a fall within the facility; wrong site/side/patient/procedure/implant event; or a hospital transfer or hospital admission upon discharge from the facility. (G8907)  

## 2011-05-04 NOTE — Patient Instructions (Signed)
YOU HAD AN ENDOSCOPIC PROCEDURE TODAY AT THE [facility]: Refer to the procedure report that was given to you for any specific questions about what was found during the examination.  If the procedure report does not answer your questions, please call your gastroenterologist to clarify.  If you requested that your care partner not be given the details of your procedure findings, then the procedure report has been included in a sealed envelope for you to review at your convenience later.  YOU SHOULD EXPECT: Some feelings of bloating in the abdomen. Passage of more gas than usual.  Walking can help get rid of the air that was put into your GI tract during the procedure and reduce the bloating. If you had a lower endoscopy (such as a colonoscopy or flexible sigmoidoscopy) you may notice spotting of blood in your stool or on the toilet paper. If you underwent a bowel prep for your procedure, then you may not have a normal bowel movement for a few days.  DIET: Your first meal following the procedure should be a light meal and then it is ok to progress to your normal diet.  A half-sandwich or bowl of soup is an example of a good first meal.  Heavy or fried foods are harder to digest and may make you feel nauseous or bloated.  Likewise meals heavy in dairy and vegetables can cause extra gas to form and this can also increase the bloating.  Drink plenty of fluids but you should avoid alcoholic beverages for 24 hours.  ACTIVITY: Your care partner should take you home directly after the procedure.  You should plan to take it easy, moving slowly for the rest of the day.  You can resume normal activity the day after the procedure however you should NOT DRIVE or use heavy machinery for 24 hours (because of the sedation medicines used during the test).    SYMPTOMS TO REPORT IMMEDIATELY: A gastroenterologist can be reached at any hour.  During normal business hours, 8:30 AM to 5:00 PM Monday through Friday,  call [phone].  After hours and on weekends, please call the GI answering service at [phone] who will take a message and have the physician on call contact you.   Following lower endoscopy (colonoscopy or flexible sigmoidoscopy): HOLD ASPIRIN AND NSAIDS FOR 1 WEEK.  Eat a high fiber diet due to your diverticulosis.  Please read your handouts.  Excessive amounts of blood in the stool  Significant tenderness or worsening of abdominal pains  Swelling of the abdomen that is new, acute  Fever of 100F or higher  Following upper endoscopy (EGD)  Vomiting of blood or coffee ground material    Possible Barrett's Esophagus noted; continue your current medications.  New chest pain or pain under the shoulder blades  Painful or persistently difficult swallowing  New shortness of breath  Fever of 100F or higher  Black, tarry-looking stools  FOLLOW UP: If any biopsies were taken you will be contacted by phone or by letter within the next 1-3 weeks.  Call your gastroenterologist if you have not heard about the biopsies in 3 weeks.  Our staff will call the home number listed on your records the next business day following your procedure to check on you and address any questions or concerns that you may have at that time regarding the information given to you following your procedure. This is a courtesy call and so if there is no answer at the home number  and we have not heard from you through the emergency physician on call, we will assume that you have returned to your regular daily activities without incident.  SIGNATURES/CONFIDENTIALITY: You and/or your care partner have signed paperwork which will be entered into your electronic medical record.  These signatures attest to the fact that that the information above on your After Visit Summary has been reviewed and is understood.  Full responsibility of the confidentiality of this discharge information lies with you and/or your care-partner.

## 2011-05-04 NOTE — Op Note (Signed)
 [facility] [address] [place], [place]  [address]  COLONOSCOPY PROCEDURE REPORT  PATIENT:  Rhonda, Fields  MR#:  [id number] BIRTHDATE:  [birth year], 60 yrs. old  GENDER:  female ENDOSCOPIST:  Carie Caddy. Nitara Szczerba, MD  PROCEDURE DATE:  05/04/2011 PROCEDURE:  Colonoscopy with snare polypectomy ASA CLASS:  Class III INDICATIONS:  Abdominal pain, Routine Risk Screening MEDICATIONS:   MAC sedation, administered by CRNA, propofol (Diprivan) 250 mg IV  DESCRIPTION OF PROCEDURE:   After the risks benefits and alternatives of the procedure were thoroughly explained, informed consent was obtained.  Digital rectal exam was performed and revealed no rectal masses.   The LB 180AL [id number] endoscope was introduced through the anus and advanced to the cecum, which was identified by both the appendix and ileocecal valve, without limitations.  The quality of the prep was good, using MoviPrep. The instrument was then slowly withdrawn as the colon was fully examined. <<PROCEDUREIMAGES>>  FINDINGS:  Two sessile polyps (3 - 5 mm) were found in the ascending colon. Polyps were snared without cautery. Retrieval was successful.   Three  sessile polyps ( 4 - 5 mm) were found in the transverse colon. Polyps were snared without cautery. Retrieval was successful. Polyp was snared, then cauterized with monopolar cautery (1). Retrieval was successful.   Three polyps ( 3 - 5 mm) were found in the recto-sigmoid colon. Polyp was snared, then cauterized with monopolar cautery (1). Retrieval was successful. Polyps were snared without cautery. Retrieval was successful. Moderate to severe diverticulosis was found throughout the colon. Retroflexed views in the rectum revealed internal hemorrhoids.  The scope was then withdrawn in  minutes from the cecum and the procedure completed.  COMPLICATIONS:  None ENDOSCOPIC IMPRESSION: 1) Sessile polyp in the ascending colon. Removed and sent  to pathology. 2) Sessile polyp, 3 in the transverse colon. Removed and sent to pathology. 3) Sessile polyp, 3 in the recto-sigmoid colon. Removed and sent to pathology. 4) Moderate diverticulosis throughout the colon 5) Internal hemorrhoids  RECOMMENDATIONS: 1) Hold aspirin, aspirin products, and anti-inflammatory medication for 1 week. 2) Await pathology results 3) High fiber diet. 4) If the polyps removed today are proven to be adenomatous (pre-cancerous) polyps, you will need a colonoscopy in 3 years. Otherwise you should continue to follow colorectal cancer screening guidelines for "routine risk" patients with a colonoscopy in 10 years. You will receive a letter within 1-2 weeks with the results of your biopsy as well as final recommendations. Please call my office if you have not received a letter after 3 weeks.  Carie Caddy. Rhea Belton, MD  CC:  Lynnea Ferrier, MD The Patient  n. eSIGNED:   Carie Caddy. Moriyah Byington at 05/04/2011 12:39 PM  Gilman Schmidt, [phone]

## 2011-05-05 ENCOUNTER — Telehealth: Payer: Self-pay | Admitting: *Deleted

## 2011-05-05 NOTE — Telephone Encounter (Signed)
  Follow up Call-  Call back number 05/04/2011  Post procedure Call Back phone  # [phone]  Permission to leave phone message Yes     Patient questions:  Do you have a fever, pain , or abdominal swelling? no Pain Score  0 *  Have you tolerated food without any problems? yes  Have you been able to return to your normal activities? yes  Do you have any questions about your discharge instructions: Diet   no Medications  no Follow up visit  no  Do you have questions or concerns about your Care? no  Actions: * If pain score is 4 or above: No action needed, pain <4.

## 2011-05-07 ENCOUNTER — Telehealth: Payer: Self-pay | Admitting: *Deleted

## 2011-05-07 NOTE — Telephone Encounter (Signed)
I forgot to inform you pt doesn't think the omeprazole is working well; give samples of another PPI? Please advise. Thanks.

## 2011-05-07 NOTE — Telephone Encounter (Signed)
She does have Barrett's esophagus. This will need to be followed with repeat EGD in 6 months She also had some polyps which were adenomatous. I will send her a letter for details these results and will also instruct her on when these tests should be repeated.

## 2011-05-07 NOTE — Telephone Encounter (Signed)
Pt stated someone called her from our office. I could not find any notes that our office called her. The path is back and I told her i would call when you gave me instructions. Thanks.

## 2011-05-07 NOTE — Telephone Encounter (Signed)
pantoprazole 40 mg daily. This should be maintained given new dx of Barrett's esophagus D/c omeprazole, replace with pantoprazole

## 2011-05-08 ENCOUNTER — Encounter: Payer: Self-pay | Admitting: Internal Medicine

## 2011-05-08 MED ORDER — PANTOPRAZOLE SODIUM 40 MG PO TBEC: DELAYED_RELEASE_TABLET | ORAL | Status: DC

## 2011-05-08 NOTE — Telephone Encounter (Signed)
lmom for pt to call back. She should begin pantoprazole 40mg  daily and stop the omeprazole. We will send her a letter about her bx and when she should repeat EGD and COLON.

## 2011-05-08 NOTE — Telephone Encounter (Signed)
Informed pt she needs to start the pantoprazole and stop the omeprazole until further orders after her repeat EGD. We will send her letters about her bx and when she should repeat her COLON; pt stated understanding.

## 2011-05-12 ENCOUNTER — Encounter: Payer: Self-pay | Admitting: *Deleted

## 2011-10-27 ENCOUNTER — Encounter: Payer: Self-pay | Admitting: Internal Medicine

## 2012-02-16 ENCOUNTER — Ambulatory Visit
Admission: RE | Admit: 2012-02-16 | Discharge: 2012-02-16 | Disposition: A | Discharge status: Home / Self Care | Payer: Medicare Other | Source: Ambulatory Visit | Attending: Family Medicine | Admitting: Family Medicine

## 2012-02-16 ENCOUNTER — Other Ambulatory Visit: Payer: Self-pay | Admitting: Family Medicine

## 2012-02-16 DIAGNOSIS — R079 Chest pain, unspecified: Secondary | ICD-10-CM

## 2012-05-19 ENCOUNTER — Encounter: Payer: Self-pay | Admitting: Internal Medicine

## 2012-06-17 ENCOUNTER — Ambulatory Visit (INDEPENDENT_AMBULATORY_CARE_PROVIDER_SITE_OTHER): Payer: Medicare Other | Admitting: Physician Assistant

## 2012-06-17 ENCOUNTER — Encounter: Payer: Self-pay | Admitting: Physician Assistant

## 2012-06-17 VITALS — BP 110/78 | HR 80 | Temp 97.1°F | Resp 18 | Ht 62.0 in | Wt 164.0 lb

## 2012-06-17 DIAGNOSIS — J019 Acute sinusitis, unspecified: Secondary | ICD-10-CM

## 2012-06-17 DIAGNOSIS — H9190 Unspecified hearing loss, unspecified ear: Secondary | ICD-10-CM | POA: Insufficient documentation

## 2012-06-17 DIAGNOSIS — B9689 Other specified bacterial agents as the cause of diseases classified elsewhere: Secondary | ICD-10-CM

## 2012-06-17 DIAGNOSIS — H9193 Unspecified hearing loss, bilateral: Secondary | ICD-10-CM

## 2012-06-17 MED ORDER — PREDNISONE 20 MG PO TABS: ORAL_TABLET | ORAL | Status: DC

## 2012-06-17 MED ORDER — CETIRIZINE HCL 10 MG PO TABS: 10.0000 mg | ORAL_TABLET | Freq: Every day | ORAL | Status: DC

## 2012-06-17 MED ORDER — AMOXICILLIN-POT CLAVULANATE 875-125 MG PO TABS: 1.0000 | ORAL_TABLET | Freq: Two times a day (BID) | ORAL | Status: DC

## 2012-06-17 NOTE — Progress Notes (Signed)
Patient ID: Rhonda Fields MRN: [id number], DOB: [birth year], 62 y.o. Date of Encounter: 06/17/2012, 9:01 AM    Chief Complaint:  Chief Complaint  Patient presents with  . sinus infection    congestion, bloody drainage, facial pain     HPI: 62 y.o. year old female reports that she has been sick for 2 weeks. Has gotten to the point that left cheek is swollen. Has used Zyrtec, Benadryl, Saline nasal spray, etc. But only getting worse. Gets thick green mucus from nose. No chest congestion at all. No sore throat. No fever,chills. Ears feel full.   Home Meds: See attached medication section for any medications that were entered at today's visit. The computer does not put those onto this list.The following list is a list of meds entered prior to today's visit.   Current Outpatient Prescriptions on File Prior to Visit  Medication Sig Dispense Refill  . acetaminophen (TYLENOL) 325 MG tablet Take 325-650 mg by mouth every 6 (six) hours as needed. Pain      . hyoscyamine (ANASPAZ) 0.125 MG TBDP Place 0.125 mg under the tongue every 4 (four) hours as needed.      . ondansetron (ZOFRAN) 4 MG tablet Take 1 tab every 6 hours as needed for nausea.  30 tablet  1   Current Facility-Administered Medications on File Prior to Visit  Medication Dose Route Frequency Provider Last Rate Last Dose  . 0.9 %  sodium chloride infusion  500 mL Intravenous Continuous Beverley Fiedler, MD        Allergies:  Allergies  Allergen Reactions  . Contrast Media (Iodinated Diagnostic Agents) Nausea And Vomiting  . Penicillins Hives and Itching      Review of Systems: See HPI for pertinent ROS. All other ROS negative.    Physical Exam: Blood pressure 110/78, pulse 80, temperature 97.1 F (36.2 C), temperature source Oral, resp. rate 18, height 5\' 2"  (1.575 m), weight 164 lb (74.39 kg)., Body mass index is 29.99 kg/(m^2). General: WNWD Female. Appears in no acute distress. HEENT: Normocephalic, atraumatic,  eyes without discharge, sclera non-icteric, nares are without discharge. Bilateral auditory canals clear, TM's are without perforation, pearly grey and translucent with reflective cone of light bilaterally. Oral cavity moist, posterior pharynx without exudate, erythema, peritonsillar abscess. Frontal sinuses very tender with percussion. Left maxillary sinus is very tender with percussion and is visibly "puffy"on inspections. No tenderness of right maxillary sinus with percussion.   Neck: Supple. Left anterior cervical node is very tender with palpation. Remainder of nodes normal.  Lungs: Clear bilaterally to auscultation without wheezes, rales, or rhonchi. Breathing is unlabored. Heart: Regular rhythm. No murmurs, rubs, or gallops. Msk:  Strength and tone normal for age. Extremities/Skin: Warm and dry. No clubbing or cyanosis. No edema. No rashes or suspicious lesions. Neuro: Alert and oriented X 3. Moves all extremities spontaneously. Gait is normal. CNII-XII grossly in tact. Psych:  Responds to questions appropriately with a normal affect.     ASSESSMENT AND PLAN:  62 y.o. year old female with  1. Acute bacterial sinusitis - amoxicillin-clavulanate (AUGMENTIN) 875-125 MG per tablet; Take 1 tablet by mouth 2 (two) times daily.  Dispense: 28 tablet; Refill: 0 - predniSONE (DELTASONE) 20 MG tablet; Take 3 daily for 2 days, then 2 daily for 2 days, then 1 daily for 2 days.  Dispense: 12 tablet; Refill: 0  Has allergy to Penicillin but pt says it is only with penicillin itself. Says she can take other -cillins, including Augmentin.  F/U if does not resolve in 2 weeks.   Signed, [address] [place], [place], [name] 06/17/2012 9:01 AM

## 2012-06-24 ENCOUNTER — Telehealth: Payer: Self-pay | Admitting: Physician Assistant

## 2012-06-24 NOTE — Telephone Encounter (Signed)
Pt aware.

## 2012-06-24 NOTE — Telephone Encounter (Signed)
Rhonda Fields just addressed 

## 2012-06-24 NOTE — Telephone Encounter (Signed)
Would not use more prednisone now. Complete the antibiotics. If no better after abx completed, then come in for f/u OV

## 2012-07-25 ENCOUNTER — Telehealth: Payer: Self-pay | Admitting: Family Medicine

## 2012-07-25 MED ORDER — BUPROPION HCL ER (SR) 150 MG PO TB12: 150.0000 mg | ORAL_TABLET | Freq: Two times a day (BID) | ORAL | Status: DC

## 2012-07-25 NOTE — Telephone Encounter (Signed)
Med refilled.

## 2012-08-24 ENCOUNTER — Ambulatory Visit (INDEPENDENT_AMBULATORY_CARE_PROVIDER_SITE_OTHER): Payer: Medicare Other | Admitting: Physician Assistant

## 2012-08-24 ENCOUNTER — Encounter: Payer: Self-pay | Admitting: Physician Assistant

## 2012-08-24 VITALS — BP 120/88 | HR 88 | Temp 97.9°F | Resp 20 | Wt 161.0 lb

## 2012-08-24 DIAGNOSIS — L918 Other hypertrophic disorders of the skin: Secondary | ICD-10-CM

## 2012-08-24 DIAGNOSIS — L919 Hypertrophic disorder of the skin, unspecified: Secondary | ICD-10-CM

## 2012-08-24 NOTE — Progress Notes (Signed)
Patient ID: Rhonda Fields MRN: [id number], DOB: 1950/12/23, 62 y.o. Date of Encounter: 08/24/2012, 10:14 AM    Chief Complaint:  Chief Complaint  Patient presents with  . wants skin tags removed  . c/o overactive bladder     HPI: 62 y.o. year old female here for above. Discussed that we donot have time allowed to do both. She wil schedule f/u OV to discuss bladder.  Has multiple skin tags on neck-they are very irritating to her, get caught on her clothes and jewelry and are itchy at times/.      Home Meds: See attached medication section for any medications that were entered at today's visit. The computer does not put those onto this list.The following list is a list of meds entered prior to today's visit.   Current Outpatient Prescriptions on File Prior to Visit  Medication Sig Dispense Refill  . acetaminophen (TYLENOL) 325 MG tablet Take 325-650 mg by mouth every 6 (six) hours as needed. Pain      . buPROPion (WELLBUTRIN SR) 150 MG 12 hr tablet Take 1 tablet (150 mg total) by mouth 2 (two) times daily.  60 tablet  1  . cetirizine (ZYRTEC) 10 MG tablet Take 1 tablet (10 mg total) by mouth daily.  30 tablet  11  . hyoscyamine (ANASPAZ) 0.125 MG TBDP Place 0.125 mg under the tongue every 4 (four) hours as needed.      Marland Kitchen omeprazole (PRILOSEC) 40 MG capsule Take 2 capsules by mouth daily.      Marland Kitchen amoxicillin-clavulanate (AUGMENTIN) 875-125 MG per tablet Take 1 tablet by mouth 2 (two) times daily.  28 tablet  0  . ondansetron (ZOFRAN) 4 MG tablet Take 1 tab every 6 hours as needed for nausea.  30 tablet  1  . predniSONE (DELTASONE) 20 MG tablet Take 3 daily for 2 days, then 2 daily for 2 days, then 1 daily for 2 days.  12 tablet  0   Current Facility-Administered Medications on File Prior to Visit  Medication Dose Route Frequency Provider Last Rate Last Dose  . 0.9 %  sodium chloride infusion  500 mL Intravenous Continuous Beverley Fiedler, MD        Allergies:  Allergies   Allergen Reactions  . Contrast Media (Iodinated Diagnostic Agents) Nausea And Vomiting  . Penicillins Hives and Itching      Review of Systems: See HPI for pertinent ROS. All other ROS negative.    Physical Exam: Blood pressure 120/88, pulse 88, temperature 97.9 F (36.6 C), temperature source Oral, resp. rate 20, weight 161 lb (73.029 kg)., Body mass index is 29.44 kg/(m^2). General:  Appears in no acute distress. Lungs: Clear bilaterally to auscultation without wheezes, rales, or rhonchi. Breathing is unlabored. Heart: Regular rhythm. No murmurs, rubs, or gallops. Msk:  Strength and tone normal for age. Skin: Warm and dry.  She has MULTIPLE (approx 50 ) skin tags all around her neck and upper chest. They are smal (ranging in size from approx 3mm to 5 mm). All are simple skin tags.  Neuro: Alert and oriented X 3. Moves all extremities spontaneously. Gait is normal. CNII-XII grossly in tact. Psych:  Responds to questions appropriately with a normal affect.     ASSESSMENT AND PLAN:  62 y.o. year old female with  1. Cutaneous skin tags Each site was cleansed with alcohol. Anaesthesized with hurricane spray. Excised with scissors. No bleeding. No complications.    [address] [place], [place], [place] 08/24/2012 10:14  AM    

## 2012-08-29 ENCOUNTER — Encounter: Payer: Self-pay | Admitting: Physician Assistant

## 2012-08-29 ENCOUNTER — Ambulatory Visit (INDEPENDENT_AMBULATORY_CARE_PROVIDER_SITE_OTHER): Payer: Medicare Other | Admitting: Physician Assistant

## 2012-08-29 VITALS — BP 106/70 | HR 96 | Temp 97.7°F | Resp 20 | Ht 60.5 in | Wt 158.0 lb

## 2012-08-29 DIAGNOSIS — N318 Other neuromuscular dysfunction of bladder: Secondary | ICD-10-CM

## 2012-08-29 DIAGNOSIS — N3281 Overactive bladder: Secondary | ICD-10-CM

## 2012-08-29 MED ORDER — OXYBUTYNIN CHLORIDE 5 MG PO TABS: 5.0000 mg | ORAL_TABLET | Freq: Three times a day (TID) | ORAL | Status: DC

## 2012-08-29 NOTE — Progress Notes (Signed)
Patient ID: Rhonda Fields MRN: [id number], DOB: [birth year], 62 y.o. Date of Encounter: 08/29/2012, 9:53 AM    Chief Complaint:  Chief Complaint  Patient presents with  . overactive bladder     HPI: 63 y.o. year old female here to discuss overactive bladdr. Has never had any medical evaluation of this before. Says it has gotten much worse over past month. Used to [employer] precaution[employer]" purpose but now says has to change pad 2-3 times per day. Severe urgency-As soon as she feels urge to urinate, urine starts to come out very soon-before she can make it to nearby bathroom.  Also, wakes 2-3 times per night to go to BR. Still, has to use covering on her bed also.  Has had no pelvic exam in > one year.  Home Meds: See attached medication section for any medications that were entered at today's visit. The computer does not put those onto this list.The following list is a list of meds entered prior to today's visit.   Current Outpatient Prescriptions on File Prior to Visit  Medication Sig Dispense Refill  . acetaminophen (TYLENOL) 325 MG tablet Take 325-650 mg by mouth every 6 (six) hours as needed. Pain      . amoxicillin-clavulanate (AUGMENTIN) 875-125 MG per tablet Take 1 tablet by mouth 2 (two) times daily.  28 tablet  0  . buPROPion (WELLBUTRIN SR) 150 MG 12 hr tablet Take 1 tablet (150 mg total) by mouth 2 (two) times daily.  60 tablet  1  . cetirizine (ZYRTEC) 10 MG tablet Take 1 tablet (10 mg total) by mouth daily.  30 tablet  11  . hyoscyamine (ANASPAZ) 0.125 MG TBDP Place 0.125 mg under the tongue every 4 (four) hours as needed.      Marland Kitchen omeprazole (PRILOSEC) 40 MG capsule Take 2 capsules by mouth daily.      . ondansetron (ZOFRAN) 4 MG tablet Take 1 tab every 6 hours as needed for nausea.  30 tablet  1  . predniSONE (DELTASONE) 20 MG tablet Take 3 daily for 2 days, then 2 daily for 2 days, then 1 daily for 2 days.  12 tablet  0   Current Facility-Administered Medications  on File Prior to Visit  Medication Dose Route Frequency Provider Last Rate Last Dose  . 0.9 %  sodium chloride infusion  500 mL Intravenous Continuous Beverley Fiedler, MD        Allergies:  Allergies  Allergen Reactions  . Contrast Media (Iodinated Diagnostic Agents) Nausea And Vomiting  . Penicillins Hives and Itching      Review of Systems: See HPI for pertinent ROS. All other ROS negative.    Physical Exam: Blood pressure 106/70, pulse 96, temperature 97.7 F (36.5 C), temperature source Oral, resp. rate 20, height 5' 0.5" (1.537 m), weight 158 lb (71.668 kg)., Body mass index is 30.34 kg/(m^2). General: Overweight Female. Appears in no acute distress. Lungs: Clear bilaterally to auscultation without wheezes, rales, or rhonchi. Breathing is unlabored. Heart: Regular rhythm. No murmurs, rubs, or gallops. Abdomen: Soft, non-tender, non-distended with normoactive bowel sounds. No hepatomegaly. No rebound/guarding. No obvious abdominal masses. Msk:  Strength and tone normal for age. PELVIC EXAM: External Genitalia normal. Vaginal mucosa Normal. No significant prolapse of bladder.  Neuro: Alert and oriented X 3. Moves all extremities spontaneously. Gait is normal. CNII-XII grossly in tact. Psych:  Responds to questions appropriately with a normal affect.     ASSESSMENT AND PLAN:  62 y.o. year  old female with  1. Overactive bladder Pt wants generic medication. She is to call me if needs to increase dose or if develops adverse medication effects.  - oxybutynin (DITROPAN) 5 MG tablet; Take 1 tablet (5 mg total) by mouth 3 (three) times daily.  Dispense: 90 tablet; Refill: [address] [place], [place], [facility] 08/29/2012 9:53 AM

## 2012-09-25 ENCOUNTER — Other Ambulatory Visit: Payer: Self-pay | Admitting: Family Medicine

## 2012-10-31 ENCOUNTER — Ambulatory Visit (INDEPENDENT_AMBULATORY_CARE_PROVIDER_SITE_OTHER): Payer: Medicare Other | Admitting: Family Medicine

## 2012-10-31 ENCOUNTER — Encounter: Payer: Self-pay | Admitting: Family Medicine

## 2012-10-31 VITALS — BP 120/78 | HR 78 | Temp 98.4°F | Resp 20 | Wt 154.0 lb

## 2012-10-31 DIAGNOSIS — N3281 Overactive bladder: Secondary | ICD-10-CM

## 2012-10-31 DIAGNOSIS — H109 Unspecified conjunctivitis: Secondary | ICD-10-CM

## 2012-10-31 HISTORY — DX: Unspecified conjunctivitis: H10.9

## 2012-10-31 MED ORDER — POLYMYXIN B-TRIMETHOPRIM 10000-0.1 UNIT/ML-% OP SOLN: 1.0000 [drp] | Freq: Four times a day (QID) | OPHTHALMIC | Status: DC

## 2012-10-31 MED ORDER — OXYBUTYNIN CHLORIDE 5 MG PO TABS: 5.0000 mg | ORAL_TABLET | Freq: Three times a day (TID) | ORAL | Status: DC

## 2012-10-31 NOTE — Assessment & Plan Note (Signed)
We'll treat with Polytrim antibiotic drops to right eye for one week

## 2012-10-31 NOTE — Patient Instructions (Signed)
Use the drops for 1 week New script for ditropan sent in   Bacterial Conjunctivitis Bacterial conjunctivitis, commonly called pink eye, is an inflammation of the clear membrane that covers the white part of the eye (conjunctiva). The inflammation can also happen on the underside of the eyelids. The blood vessels in the conjunctiva become inflamed causing the eye to become red or pink. Bacterial conjunctivitis may spread easily from one eye to another and from person to person (contagious).  CAUSES  Bacterial conjunctivitis is caused by bacteria. The bacteria may come from your own skin, your upper respiratory tract, or from someone else with bacterial conjunctivitis. SYMPTOMS  The normally white color of the eye or the underside of the eyelid is usually pink or red. The pink eye is usually associated with irritation, tearing, and some sensitivity to light. Bacterial conjunctivitis is often associated with a thick, yellowish discharge from the eye. The discharge may turn into a crust on the eyelids overnight, which causes your eyelids to stick together. If a discharge is present, there may also be some blurred vision in the affected eye. DIAGNOSIS  Bacterial conjunctivitis is diagnosed by your caregiver through an eye exam and the symptoms that you report. Your caregiver looks for changes in the surface tissues of your eyes, which may point to the specific type of conjunctivitis. A sample of any discharge may be collected on a cotton-tip swab if you have a severe case of conjunctivitis, if your cornea is affected, or if you keep getting repeat infections that do not respond to treatment. The sample will be sent to a lab to see if the inflammation is caused by a bacterial infection and to see if the infection will respond to antibiotic medicines. TREATMENT   Bacterial conjunctivitis is treated with antibiotics. Antibiotic eyedrops are most often used. However, antibiotic ointments are also available.  Antibiotics pills are sometimes used. Artificial tears or eye washes may ease discomfort. HOME CARE INSTRUCTIONS   To ease discomfort, apply a cool, clean wash cloth to your eye for 10 20 minutes, 3 4 times a day.  Gently wipe away any drainage from your eye with a warm, wet washcloth or a cotton ball.  Wash your hands often with soap and water. Use paper towels to dry your hands.  Do not share towels or wash cloths. This may spread the infection.  Change or wash your pillow case every day.  You should not use eye makeup until the infection is gone.  Do not operate machinery or drive if your vision is blurred.  Stop using contacts lenses. Ask your caregiver how to sterilize or replace your contacts before using them again. This depends on the type of contact lenses that you use.  When applying medicine to the infected eye, do not touch the edge of your eyelid with the eyedrop bottle or ointment tube. SEEK IMMEDIATE MEDICAL CARE IF:   Your infection has not improved within 3 days after beginning treatment.  You had yellow discharge from your eye and it returns.  You have increased eye pain.  Your eye redness is spreading.  Your vision becomes blurred.  You have a fever or persistent symptoms for more than 2 3 days.  You have a fever and your symptoms suddenly get worse.  You have facial pain, redness, or swelling. MAKE SURE YOU:   Understand these instructions.  Will watch your condition.  Will get help right away if you are not doing well or get worse. Document  Released: 01/05/2005 Document Revised: 09/30/2011 Document Reviewed: 06/08/2011 [facility] Patient Information 2014 [place], [place].

## 2012-10-31 NOTE — Progress Notes (Signed)
  Subjective:    Patient ID: Rhonda Fields, female    DOB: [birth year], 62 y.o.   MRN: [id number]  HPI  Patient here with right eye infection. She's history conjunctivitis about a year ago. Yesterday her cat was in her bedroom and she thinks a piece of the hair was caught between her eyes she began to have drainage from the high in severe redness itching and burning sensation. She states that her vision is blurred some on the right side compared to the left. She denies any pain with movement of the eye. She has crusting this morning from continued drainage.  Review of Systems  GEN- denies fatigue, fever, weight loss,weakness, recent illness HEENT- +eye drainage, change in vision, nasal discharge, Neuro- denies headache, dizziness, syncope, seizure activity      Objective:   Physical Exam GEN- NAD, alert and oriented x3 HEENT- PERRL, EOMI, + injected right sclera,+injected conjunctiva, mild yellow drainage and crusting noted MMM, oropharynx clear Neck- Supple, no LAD          Assessment & Plan:

## 2012-11-23 ENCOUNTER — Other Ambulatory Visit: Payer: Self-pay | Admitting: Family Medicine

## 2012-11-24 ENCOUNTER — Other Ambulatory Visit: Payer: Self-pay | Admitting: Family Medicine

## 2012-11-24 ENCOUNTER — Telehealth: Payer: Self-pay | Admitting: Family Medicine

## 2012-11-24 MED ORDER — OMEPRAZOLE 40 MG PO CPDR: 80.0000 mg | DELAYED_RELEASE_CAPSULE | Freq: Every day | ORAL | Status: DC

## 2012-11-24 NOTE — Telephone Encounter (Signed)
Rx Refilled  

## 2012-11-24 NOTE — Telephone Encounter (Signed)
Patient needs Prilosec refilled.   [facility]  [address]

## 2012-12-22 IMAGING — CT CT ABD-PELV W/ CM
2 of 4 series · 17 of 46 positions shown, 19 images · IV contrast (agent unspecified)
Comparison: None

CLINICAL DATA: Abdominal pain, nausea and vomiting.

CT ABDOMEN AND PELVIS WITH CONTRAST
TECHNIQUE: Multidetector CT imaging of the abdomen and pelvis was
performed using the standard protocol following bolus
administration of intravenous contrast.
Contrast:  100 ml Gmnipaque-FPP.

[Series 2: abd/ pel 5mm · axial · 0.73mm/px · z∈[-460,-30]mm · 14 of 94 slices shown, 16 images]
[im 4/94  soft-tissue]
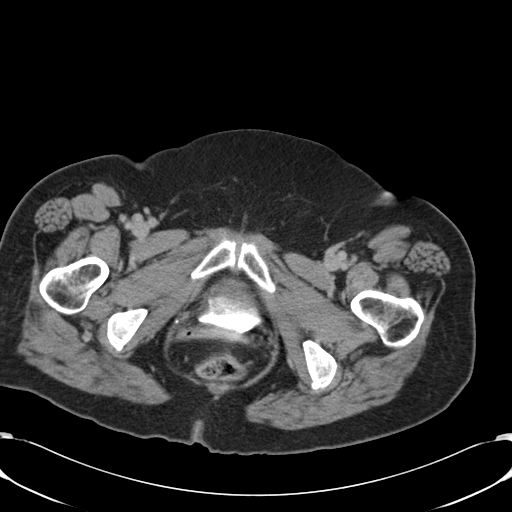
[im 4/94  bone]
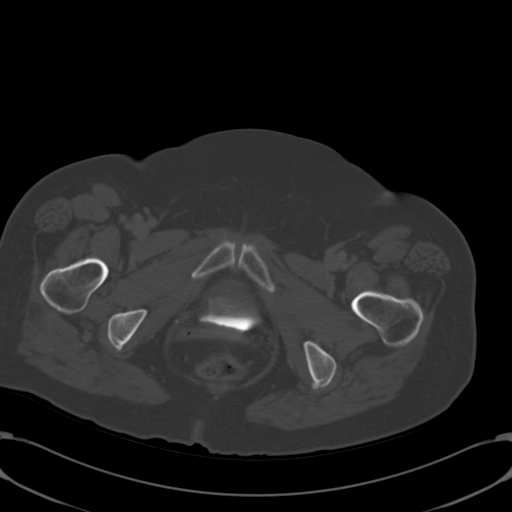
[im 12/94  soft-tissue]
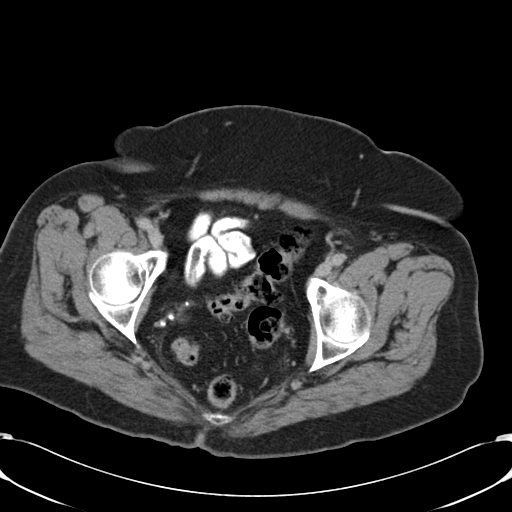
[im 19/94  soft-tissue]
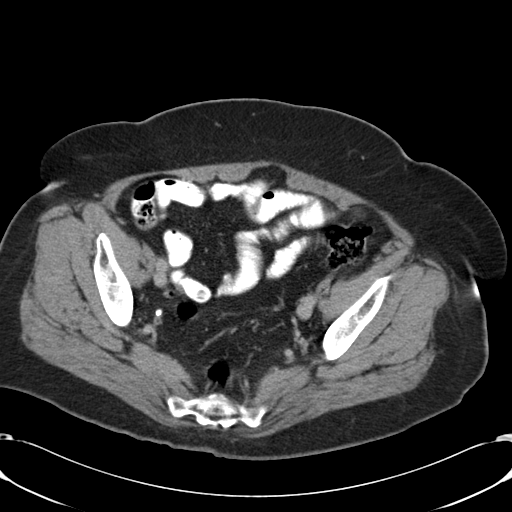
[im 27/94  soft-tissue]
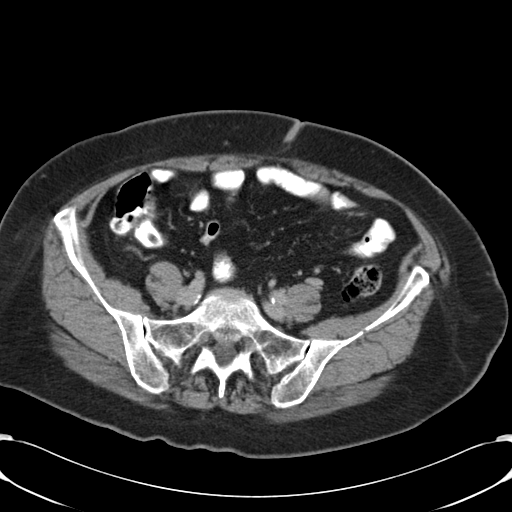
[im 30/94  soft-tissue]
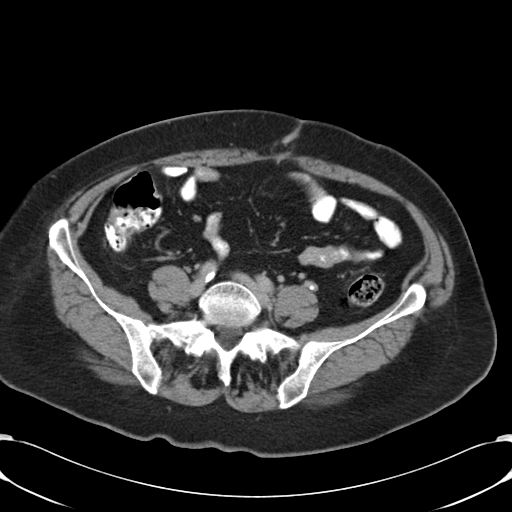
[im 38/94  soft-tissue]
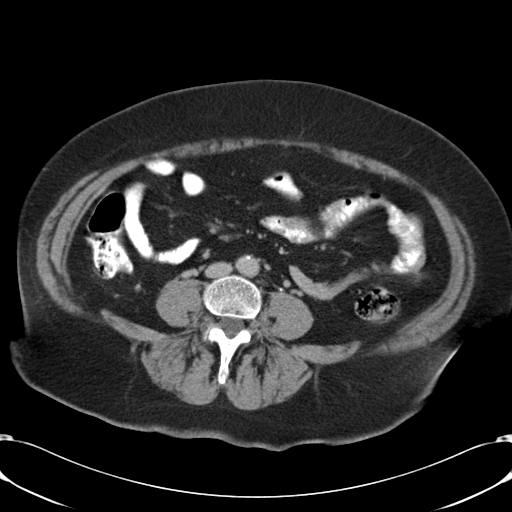
[im 45/94  soft-tissue]
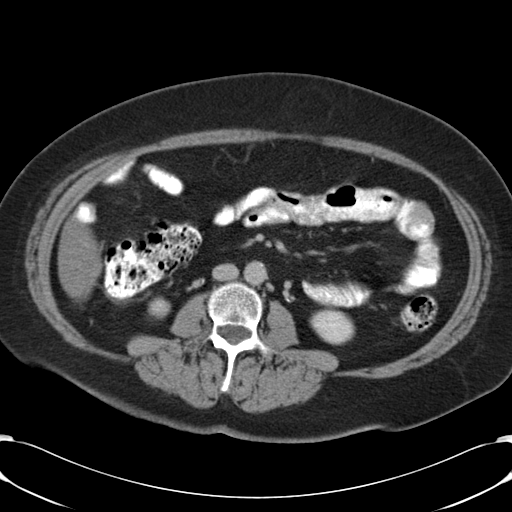
[im 49/94  soft-tissue]
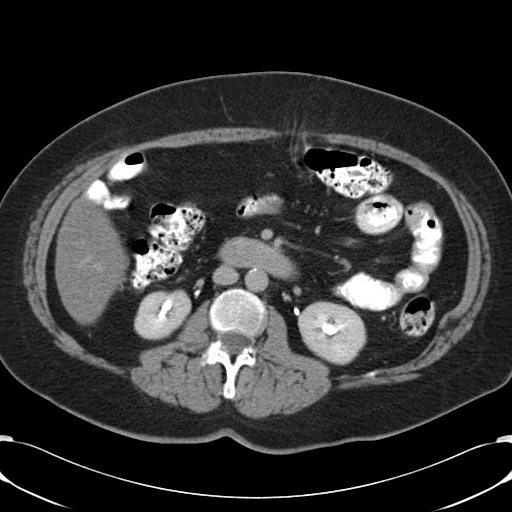
[im 56/94  soft-tissue]
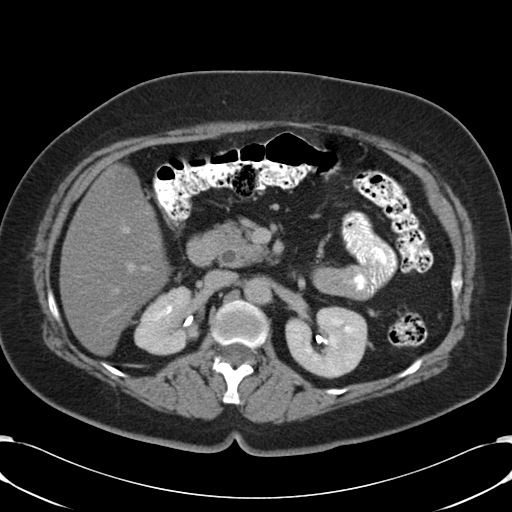
[im 56/94  bone]
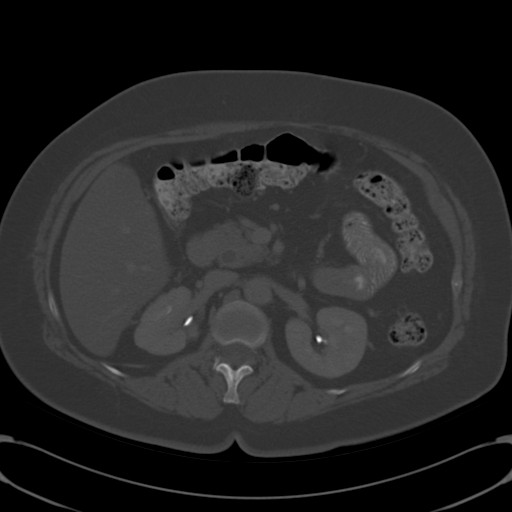
[im 64/94  soft-tissue]
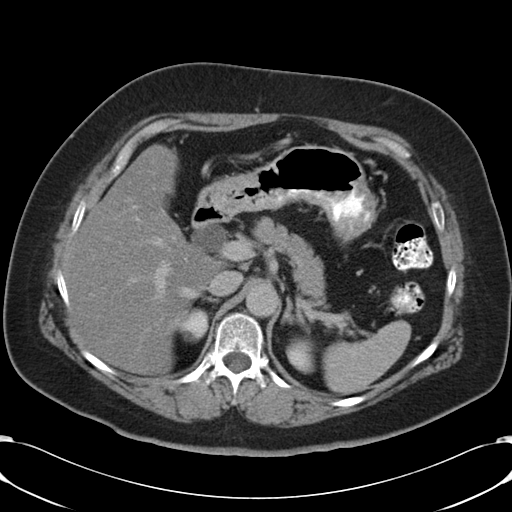
[im 71/94  soft-tissue]
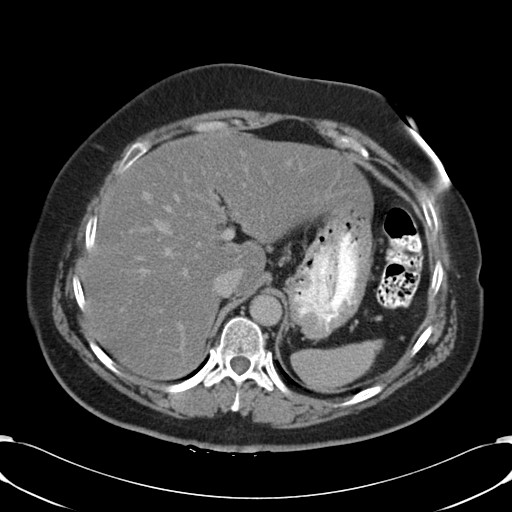
[im 75/94  soft-tissue]
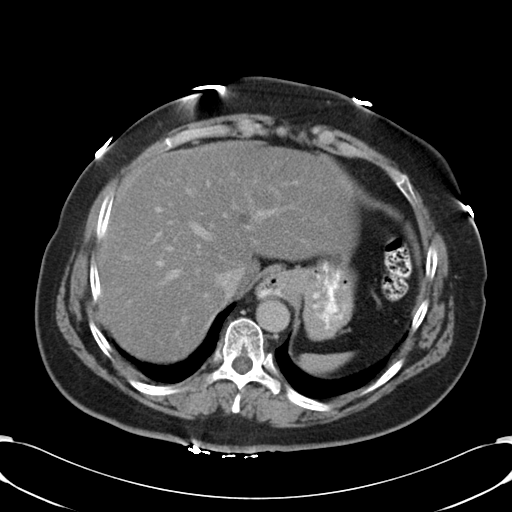
[im 82/94  soft-tissue]
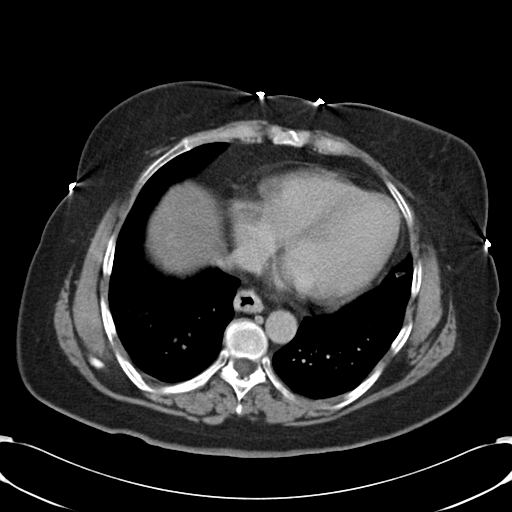
[im 90/94  soft-tissue]
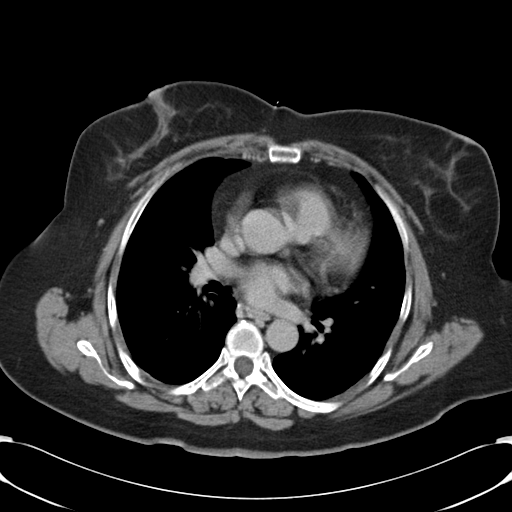

[Series 602: cor · coronal · 0.94mm/px · 3 of 107 slices shown]
[im 36/107  soft-tissue]
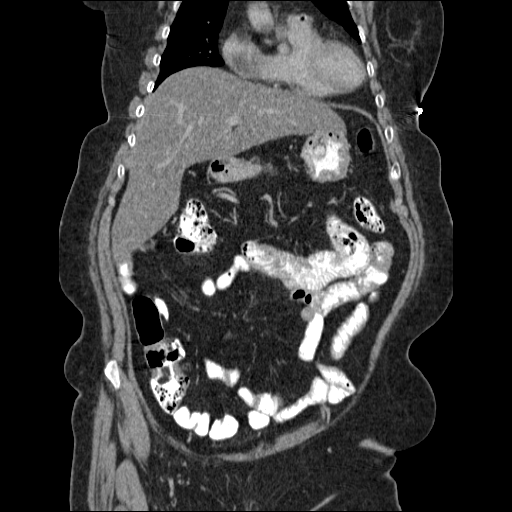
[im 48/107  soft-tissue]
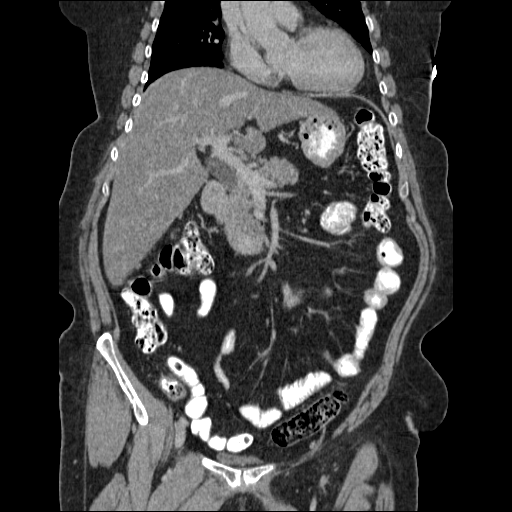
[im 59/107  soft-tissue]
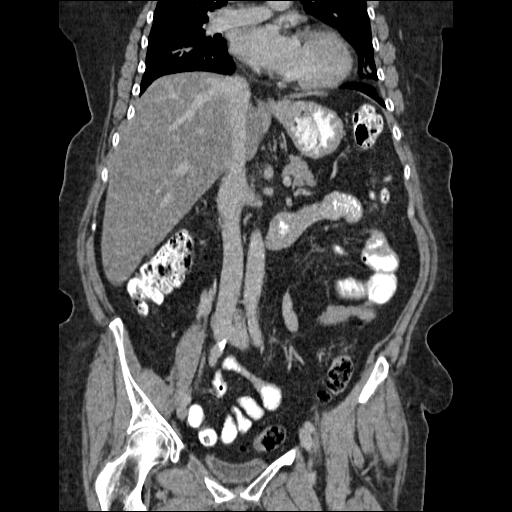

[17 of 46 positions shown; findings below may reference images not displayed]

FINDINGS: The scan was delayed due to vomiting during the
injection of contrast.

The lung bases are grossly clear.  Moderate respiratory motion.  A
small hiatal hernia is noted.

The liver demonstrates diffuse fatty infiltration.  No worrisome
hepatic lesions.  The gallbladder is surgically absent.  There is
moderate common bile duct dilatation with a distal common bile duct
obstructing calculus.  Recommend correlation with liver function
studies.  ERCP suggested for further evaluation and treatment.  The
spleen is normal in size.  The pancreas is unremarkable.  The
adrenal glands and kidneys are normal.  There are scarring type
changes involving the right kidney.

The stomach, duodenum, small bowel and colon are unremarkable.  The
appendix is normal.  No mesenteric or retroperitoneal masses or
lymphadenopathy.  The aorta demonstrates mild atherosclerotic
calcifications but no focal aneurysm or dissection.

The bladder is normal the uterus is surgically absent as are the
ovaries.  No pelvic mass or adenopathy..

The bony structures are unremarkable.
IMPRESSION: 1.  Obstructing common bile duct stone distally.  Recommend
consideration for ERCP for further evaluation and treatment.
2.  Diffuse fatty infiltration of the liver.
3.  Hiatal hernia.
4.  Scarring changes involving the right kidney.

## 2012-12-23 ENCOUNTER — Other Ambulatory Visit: Payer: Self-pay | Admitting: Family Medicine

## 2012-12-23 ENCOUNTER — Encounter: Payer: Self-pay | Admitting: Family Medicine

## 2012-12-23 DIAGNOSIS — Z1231 Encounter for screening mammogram for malignant neoplasm of breast: Secondary | ICD-10-CM

## 2012-12-23 NOTE — Progress Notes (Signed)
Records indicate patient is due for screening mammogram.  Mammogram was ordered and patient sent letter to call and schedule

## 2013-01-27 ENCOUNTER — Ambulatory Visit (INDEPENDENT_AMBULATORY_CARE_PROVIDER_SITE_OTHER): Payer: Medicare HMO | Admitting: Family Medicine

## 2013-01-27 ENCOUNTER — Encounter: Payer: Self-pay | Admitting: Family Medicine

## 2013-01-27 VITALS — BP 110/74 | HR 98 | Temp 98.7°F | Resp 18 | Ht 63.0 in | Wt 145.0 lb

## 2013-01-27 DIAGNOSIS — R634 Abnormal weight loss: Secondary | ICD-10-CM

## 2013-01-27 MED ORDER — TRIAMCINOLONE ACETONIDE 0.1 % EX CREA: 1.0000 "application " | TOPICAL_CREAM | Freq: Two times a day (BID) | CUTANEOUS | Status: DC

## 2013-01-27 NOTE — Progress Notes (Signed)
Subjective:    Patient ID: Rhonda Fields, female    DOB: 1950-09-07, 63 y.o.   MRN: [id number]  HPI Patient is a very pleasant 63 year old female who presents with severe hair loss over the last 2 months. It is literally coming out in clumps when she washes her .  Her is extremely thin on the top of his nose on the left temple. She is also lost approximately 15 pounds since her last office visit in August. This is unintentional..  she has not had a mammogram in over a decade and she refuses another mammogram. Her last colonoscopy and EGD were less than 2 years ago. She has a history of a hysterectomy and therefore does not require Pap smears. She is a smoker. She denies cough shortness of breath or hemoptysis.  She attributes the head loss the recent addition of oxybutynin. Past Medical History  Diagnosis Date  . Anxiety   . Arrhythmia   . Chronic headaches   . Depression   . IBS (irritable bowel syndrome) 1986  . GERD (gastroesophageal reflux disease)   . Hard of hearing    Current Outpatient Prescriptions on File Prior to Visit  Medication Sig Dispense Refill  . buPROPion (WELLBUTRIN SR) 150 MG 12 hr tablet TAKE 1 TABLET BY MOUTH TWICE DAILY  60 tablet  5  . cetirizine (ZYRTEC) 10 MG tablet Take 1 tablet (10 mg total) by mouth daily.  30 tablet  11  . omeprazole (PRILOSEC) 40 MG capsule TAKE 1 CAPSULE BY MOUTH TWICE DAILY  60 capsule  11  . oxybutynin (DITROPAN) 5 MG tablet Take 1 tablet (5 mg total) by mouth 3 (three) times daily.  90 tablet  5   Current Facility-Administered Medications on File Prior to Visit  Medication Dose Route Frequency Provider Last Rate Last Dose  . 0.9 %  sodium chloride infusion  500 mL Intravenous Continuous Jerene Bears, MD       Allergies  Allergen Reactions  . Contrast Media [Iodinated Diagnostic Agents] Nausea And Vomiting  . Penicillins Hives and Itching   History   Social History  . Marital Status: Widowed    Spouse Name: N/A   Number of Children: 4  . Years of Education: N/A   Occupational History  . Unemployed    Social History Main Topics  . Smoking status: Current Every Day Smoker -- 1.00 packs/day for 40 years    Types: Cigarettes  . Smokeless tobacco: Never Used  . Alcohol Use: No  . Drug Use: No  . Sexual Activity: Not on file   Other Topics Concern  . Not on file   Social History Narrative  . No narrative on file      Review of Systems  All other systems reviewed and are negative.       Objective:   Physical Exam  Vitals reviewed. Constitutional: She appears well-developed and well-nourished. No distress.  HENT:  Nose: Nose normal.  Mouth/Throat: Oropharynx is clear and moist. No oropharyngeal exudate.  Eyes: Conjunctivae are normal. Right eye exhibits no discharge. Left eye exhibits no discharge. No scleral icterus.  Neck: Normal range of motion. Neck supple. No JVD present. No thyromegaly present.  Cardiovascular: Normal rate, regular rhythm and normal heart sounds.  Exam reveals no gallop and no friction rub.   No murmur heard. Pulmonary/Chest: Effort normal and breath sounds normal. No respiratory distress. She has no wheezes. She has no rales.  Abdominal: Soft. Bowel sounds are normal. She exhibits  no distension and no mass. There is no tenderness. There is no rebound and no guarding.  Musculoskeletal: She exhibits no edema.  Lymphadenopathy:    She has no cervical adenopathy.  Skin: Rash noted. She is not diaphoretic.          Assessment & Plan:  1. Loss of weight Patient has lost substantial weight over the last 3 months unintentionally. Given this and the significant alopecia I am concerned about possible malignancy somewhere in the body of the check a CBC, CMP, TSH, sedimentation rate, and chest x-ray. I also recommended a mammogram but the patient refused. Await results of lab work prior to deciding on further workup.  The patient also has an eczematous form rash in  both ear canals. I prescribed triamcinolone 0.1% cream to be applied twice a day for one week this rash. - COMPLETE METABOLIC PANEL WITH GFR - CBC with Differential - TSH - Sedimentation rate - DG Chest 2 View; Future

## 2013-01-28 LAB — COMPLETE METABOLIC PANEL WITH GFR
ALBUMIN: 4 g/dL (ref 3.5–5.2)
ALT: 100 U/L — AB (ref 0–35)
AST: 96 U/L — AB (ref 0–37)
Alkaline Phosphatase: 186 U/L — ABNORMAL HIGH (ref 39–117)
BUN: 7 mg/dL (ref 6–23)
CALCIUM: 9.3 mg/dL (ref 8.4–10.5)
CHLORIDE: 95 meq/L — AB (ref 96–112)
CO2: 24 mEq/L (ref 19–32)
Creat: 0.73 mg/dL (ref 0.50–1.10)
GFR, Est African American: >89 mL/min
GFR, Est Non African American: 89 mL/min
Glucose, Bld: 476 mg/dL (ref 70–99)
POTASSIUM: 3.9 meq/L (ref 3.5–5.3)
Sodium: 133 mEq/L — ABNORMAL LOW (ref 135–145)
Total Bilirubin: 0.6 mg/dL (ref 0.3–1.2)
Total Protein: 7.5 g/dL (ref 6.0–8.3)

## 2013-01-28 LAB — CBC WITH DIFFERENTIAL/PLATELET
Basophils Absolute: 0.1 10*3/uL (ref 0.0–0.1)
Basophils Relative: 1 % (ref 0–1)
EOS PCT: 1 % (ref 0–5)
Eosinophils Absolute: 0.1 10*3/uL (ref 0.0–0.7)
HCT: 45.4 % (ref 36.0–46.0)
Hemoglobin: 15.6 g/dL — ABNORMAL HIGH (ref 12.0–15.0)
LYMPHS ABS: 2.5 10*3/uL (ref 0.7–4.0)
Lymphocytes Relative: 25 % (ref 12–46)
MCH: 30.7 pg (ref 26.0–34.0)
MCHC: 34.4 g/dL (ref 30.0–36.0)
MCV: 89.4 fL (ref 78.0–100.0)
Monocytes Absolute: 0.6 10*3/uL (ref 0.1–1.0)
Monocytes Relative: 6 % (ref 3–12)
Neutro Abs: 6.7 10*3/uL (ref 1.7–7.7)
Neutrophils Relative %: 67 % (ref 43–77)
PLATELETS: 293 10*3/uL (ref 150–400)
RBC: 5.08 MIL/uL (ref 3.87–5.11)
RDW: 13.3 % (ref 11.5–15.5)
WBC: 10.1 10*3/uL (ref 4.0–10.5)

## 2013-01-28 LAB — TSH: TSH: 0.549 u[IU]/mL (ref 0.350–4.500)

## 2013-01-28 LAB — SEDIMENTATION RATE: Sed Rate: 15 mm/hr (ref 0–22)

## 2013-01-30 ENCOUNTER — Encounter: Payer: Self-pay | Admitting: Family Medicine

## 2013-01-30 ENCOUNTER — Ambulatory Visit (INDEPENDENT_AMBULATORY_CARE_PROVIDER_SITE_OTHER): Payer: Medicare HMO | Admitting: Family Medicine

## 2013-01-30 VITALS — BP 128/86 | HR 80 | Temp 97.4°F | Resp 20 | Wt 144.0 lb

## 2013-01-30 DIAGNOSIS — R739 Hyperglycemia, unspecified: Secondary | ICD-10-CM

## 2013-01-30 DIAGNOSIS — E119 Type 2 diabetes mellitus without complications: Secondary | ICD-10-CM | POA: Insufficient documentation

## 2013-01-30 DIAGNOSIS — R945 Abnormal results of liver function studies: Secondary | ICD-10-CM

## 2013-01-30 DIAGNOSIS — E1165 Type 2 diabetes mellitus with hyperglycemia: Secondary | ICD-10-CM

## 2013-01-30 DIAGNOSIS — IMO0001 Reserved for inherently not codable concepts without codable children: Secondary | ICD-10-CM

## 2013-01-30 DIAGNOSIS — R7989 Other specified abnormal findings of blood chemistry: Secondary | ICD-10-CM

## 2013-01-30 DIAGNOSIS — R7309 Other abnormal glucose: Secondary | ICD-10-CM

## 2013-01-30 LAB — HEPATITIS PANEL, ACUTE
HCV Ab: NEGATIVE
Hep A IgM: NONREACTIVE
Hep B C IgM: NONREACTIVE
Hepatitis B Surface Ag: NEGATIVE

## 2013-01-30 LAB — HEMOGLOBIN A1C
Hgb A1c MFr Bld: 14.1 % — ABNORMAL HIGH (ref <5.7)
MEAN PLASMA GLUCOSE: 358 mg/dL — AB (ref <117)

## 2013-01-30 LAB — BASIC METABOLIC PANEL WITH GFR
BUN: 10 mg/dL (ref 6–23)
CALCIUM: 9.1 mg/dL (ref 8.4–10.5)
CO2: 25 mEq/L (ref 19–32)
Chloride: 96 mEq/L (ref 96–112)
Creat: 0.68 mg/dL (ref 0.50–1.10)
GFR, Est African American: >89 mL/min
GFR, Est Non African American: >89 mL/min
Glucose, Bld: 427 mg/dL (ref 70–99)
Potassium: 4 mEq/L (ref 3.5–5.3)
SODIUM: 133 meq/L — AB (ref 135–145)

## 2013-01-30 MED ORDER — OMEPRAZOLE 40 MG PO CPDR: DELAYED_RELEASE_CAPSULE | ORAL | Status: DC

## 2013-01-30 MED ORDER — BUPROPION HCL ER (SR) 150 MG PO TB12: ORAL_TABLET | ORAL | Status: DC

## 2013-01-30 MED ORDER — CETIRIZINE HCL 10 MG PO TABS: 10.0000 mg | ORAL_TABLET | Freq: Every day | ORAL | Status: DC

## 2013-01-30 NOTE — Progress Notes (Signed)
Subjective:    Patient ID: Rhonda Fields, female    DOB: April 03, 1950, 63 y.o.   MRN: [id number]  HPI 01/28/12 Patient is a very pleasant 63 year old female who presents with severe hair loss over the last 2 months. It is literally coming out in clumps when she washes her .  Her is extremely thin on the top of his nose on the left temple. She is also lost approximately 15 pounds since her last office visit in August. This is unintentional..  she has not had a mammogram in over a decade and she refuses another mammogram. Her last colonoscopy and EGD were less than 2 years ago. She has a history of a hysterectomy and therefore does not require Pap smears. She is a smoker. She denies cough shortness of breath or hemoptysis.  She attributes the head loss the recent addition of oxybutynin.  At that time, my plan was:  1. Loss of weight Patient has lost substantial weight over the last 3 months unintentionally. Given this and the significant alopecia I am concerned about possible malignancy somewhere in the body of the check a CBC, CMP, TSH, sedimentation rate, and chest x-ray. I also recommended a mammogram but the patient refused. Await results of lab work prior to deciding on further workup.  The patient also has an eczematous form rash in both ear canals. I prescribed triamcinolone 0.1% cream to be applied twice a day for one week this rash. - COMPLETE METABOLIC PANEL WITH GFR - CBC with Differential - TSH - Sedimentation rate - DG Chest 2 View; Future  01/31/12 Over the weekend, I was notified of critical blood sugar of 476. I have no hemoglobin A1c.  However I started the patient on glipizide XL 10 mg by mouth daily. She's been drinking water since that time on Saturday. She is here today for confirmation as well as a hemoglobin A1c.   She is to stop drinking sodas. She is trying to monitor carbohydrates. She only had one dose of medication.  Past Medical History  Diagnosis Date  . Anxiety     . Arrhythmia   . Chronic headaches   . Depression   . IBS (irritable bowel syndrome) 1986  . GERD (gastroesophageal reflux disease)   . Hard of hearing   . Diabetes mellitus without complication    Current Outpatient Prescriptions on File Prior to Visit  Medication Sig Dispense Refill  . oxybutynin (DITROPAN) 5 MG tablet Take 1 tablet (5 mg total) by mouth 3 (three) times daily.  90 tablet  5  . triamcinolone cream (KENALOG) 0.1 % Apply 1 application topically 2 (two) times daily.  30 g  0   Current Facility-Administered Medications on File Prior to Visit  Medication Dose Route Frequency Provider Last Rate Last Dose  . 0.9 %  sodium chloride infusion  500 mL Intravenous Continuous Jerene Bears, MD       Allergies  Allergen Reactions  . Contrast Media [Iodinated Diagnostic Agents] Nausea And Vomiting  . Penicillins Hives and Itching   History   Social History  . Marital Status: Widowed    Spouse Name: N/A    Number of Children: 4  . Years of Education: N/A   Occupational History  . Unemployed    Social History Main Topics  . Smoking status: Current Every Day Smoker -- 1.00 packs/day for 40 years    Types: Cigarettes  . Smokeless tobacco: Never Used  . Alcohol Use: No  . Drug Use:  No  . Sexual Activity: Not on file   Other Topics Concern  . Not on file   Social History Narrative  . No narrative on file      Review of Systems  All other systems reviewed and are negative.       Objective:   Physical Exam  Vitals reviewed. Constitutional: She appears well-developed and well-nourished. No distress.  HENT:  Nose: Nose normal.  Mouth/Throat: Oropharynx is clear and moist. No oropharyngeal exudate.  Eyes: Conjunctivae are normal. Right eye exhibits no discharge. Left eye exhibits no discharge. No scleral icterus.  Neck: Normal range of motion. Neck supple. No JVD present. No thyromegaly present.  Cardiovascular: Normal rate, regular rhythm and normal heart  sounds.  Exam reveals no gallop and no friction rub.   No murmur heard. Pulmonary/Chest: Effort normal and breath sounds normal. No respiratory distress. She has no wheezes. She has no rales.  Abdominal: Soft. Bowel sounds are normal. She exhibits no distension and no mass. There is no tenderness. There is no rebound and no guarding.  Musculoskeletal: She exhibits no edema.  Lymphadenopathy:    She has no cervical adenopathy.  Skin: Rash noted. She is not diaphoretic.    Office Visit on 01/27/2013  Component Date Value Range Status  . Sodium 01/27/2013 133* 135 - 145 mEq/L Final  . Potassium 01/27/2013 3.9  3.5 - 5.3 mEq/L Final  . Chloride 01/27/2013 95* 96 - 112 mEq/L Final  . CO2 01/27/2013 24  19 - 32 mEq/L Final  . Glucose, Bld 01/27/2013 476* 70 - 99 mg/dL Final  . BUN 01/27/2013 7  6 - 23 mg/dL Final  . Creat 01/27/2013 0.73  0.50 - 1.10 mg/dL Final  . Total Bilirubin 01/27/2013 0.6  0.3 - 1.2 mg/dL Final  . Alkaline Phosphatase 01/27/2013 186* 39 - 117 U/L Final  . AST 01/27/2013 96* 0 - 37 U/L Final  . ALT 01/27/2013 100* 0 - 35 U/L Final  . Total Protein 01/27/2013 7.5  6.0 - 8.3 g/dL Final  . Albumin 01/27/2013 4.0  3.5 - 5.2 g/dL Final  . Calcium 01/27/2013 9.3  8.4 - 10.5 mg/dL Final  . GFR, Est African American 01/27/2013 >89   Final  . GFR, Est Non African American 01/27/2013 89   Final   Comment:                            The estimated GFR is a calculation valid for adults (>=14 years old)                          that uses the CKD-EPI algorithm to adjust for age and sex. It is                            not to be used for children, pregnant women, hospitalized patients,                             patients on dialysis, or with rapidly changing kidney function.                          According to the NKDEP, eGFR >89 is normal, 60-89 shows mild  impairment, 30-59 shows moderate impairment, 15-29 shows severe                           impairment and <15 is ESRD.                             . WBC 01/27/2013 10.1  4.0 - 10.5 K/uL Final  . RBC 01/27/2013 5.08  3.87 - 5.11 MIL/uL Final  . Hemoglobin 01/27/2013 15.6* 12.0 - 15.0 g/dL Final  . HCT 01/27/2013 45.4  36.0 - 46.0 % Final  . MCV 01/27/2013 89.4  78.0 - 100.0 fL Final  . MCH 01/27/2013 30.7  26.0 - 34.0 pg Final  . MCHC 01/27/2013 34.4  30.0 - 36.0 g/dL Final  . RDW 01/27/2013 13.3  11.5 - 15.5 % Final  . Platelets 01/27/2013 293  150 - 400 K/uL Final  . Neutrophils Relative % 01/27/2013 67  43 - 77 % Final  . Neutro Abs 01/27/2013 6.7  1.7 - 7.7 K/uL Final  . Lymphocytes Relative 01/27/2013 25  12 - 46 % Final  . Lymphs Abs 01/27/2013 2.5  0.7 - 4.0 K/uL Final  . Monocytes Relative 01/27/2013 6  3 - 12 % Final  . Monocytes Absolute 01/27/2013 0.6  0.1 - 1.0 K/uL Final  . Eosinophils Relative 01/27/2013 1  0 - 5 % Final  . Eosinophils Absolute 01/27/2013 0.1  0.0 - 0.7 K/uL Final  . Basophils Relative 01/27/2013 1  0 - 1 % Final  . Basophils Absolute 01/27/2013 0.1  0.0 - 0.1 K/uL Final  . Smear Review 01/27/2013 Criteria for review not met   Final  . TSH 01/27/2013 0.549  0.350 - 4.500 uIU/mL Final  . Sed Rate 01/27/2013 15  0 - 22 mm/hr Final         Assessment & Plan:  1. Hyperglycemia Patient appears to have uncontrolled type 2 diabetes. - Hemoglobin V5I - BASIC METABOLIC PANEL WITH GFR - Ambulatory referral to diabetic education  2. Elevated liver function tests I will also check a viral hepatitis panel. Patient does have a history of blood transfusion as a child. - Hepatitis panel, acute  3. Type II or unspecified type diabetes mellitus without mention of complication, uncontrolled Check hemoglobin A1c today. Begin glipizide XL 10 mg by mouth daily. Once, I have the result of her hemoglobin A1c I will better be able to determine what medication she will need to control her diabetes. She may require insulin. She will likely need 2-3 medications  in addition to glipizide.  I will also set her up appointment to see a diabetic nurse educator.

## 2013-02-01 ENCOUNTER — Other Ambulatory Visit: Payer: Self-pay | Admitting: Family Medicine

## 2013-02-01 MED ORDER — PANTOPRAZOLE SODIUM 40 MG PO TBEC: 40.0000 mg | DELAYED_RELEASE_TABLET | Freq: Every day | ORAL | Status: DC

## 2013-02-02 ENCOUNTER — Encounter: Payer: Self-pay | Admitting: Family Medicine

## 2013-02-02 ENCOUNTER — Ambulatory Visit (INDEPENDENT_AMBULATORY_CARE_PROVIDER_SITE_OTHER): Payer: Medicare HMO | Admitting: Family Medicine

## 2013-02-02 VITALS — BP 130/78 | HR 80 | Temp 97.6°F | Resp 18 | Ht 63.0 in | Wt 146.0 lb

## 2013-02-02 DIAGNOSIS — IMO0001 Reserved for inherently not codable concepts without codable children: Secondary | ICD-10-CM

## 2013-02-02 DIAGNOSIS — E1165 Type 2 diabetes mellitus with hyperglycemia: Principal | ICD-10-CM

## 2013-02-02 MED ORDER — PIOGLITAZONE HCL 30 MG PO TABS: 30.0000 mg | ORAL_TABLET | Freq: Every day | ORAL | Status: DC

## 2013-02-02 MED ORDER — METFORMIN HCL 1000 MG PO TABS: 1000.0000 mg | ORAL_TABLET | Freq: Two times a day (BID) | ORAL | Status: DC

## 2013-02-02 MED ORDER — ACCU-CHEK AVIVA PLUS W/DEVICE KIT: PACK | Status: DC

## 2013-02-02 MED ORDER — PANTOPRAZOLE SODIUM 40 MG PO TBEC: 40.0000 mg | DELAYED_RELEASE_TABLET | Freq: Every day | ORAL | Status: DC

## 2013-02-02 MED ORDER — GLUCOSE BLOOD VI STRP: ORAL_STRIP | Status: DC

## 2013-02-02 NOTE — Addendum Note (Signed)
Addended by: Shary Decamp B on: 02/02/2013 03:52 PM   Modules accepted: Orders

## 2013-02-02 NOTE — Progress Notes (Signed)
Subjective:    Patient ID: Rhonda Fields, female    DOB: [birth year], 62 y.o.   MRN: [id number]  HPI 01/28/12 Patient is a very pleasant 63 year old female who presents with severe hair loss over the last 2 months. It is literally coming out in clumps when she washes her .  Her is extremely thin on the top of his nose on the left temple. She is also lost approximately 15 pounds since her last office visit in August. This is unintentional..  she has not had a mammogram in over a decade and she refuses another mammogram. Her last colonoscopy and EGD were less than 2 years ago. She has a history of a hysterectomy and therefore does not require Pap smears. She is a smoker. She denies cough shortness of breath or hemoptysis.  She attributes the head loss the recent addition of oxybutynin.  At that time, my plan was:  1. Loss of weight Patient has lost substantial weight over the last 3 months unintentionally. Given this and the significant alopecia I am concerned about possible malignancy somewhere in the body of the check a CBC, CMP, TSH, sedimentation rate, and chest x-ray. I also recommended a mammogram but the patient refused. Await results of lab work prior to deciding on further workup.  The patient also has an eczematous form rash in both ear canals. I prescribed triamcinolone 0.1% cream to be applied twice a day for one week this rash. - COMPLETE METABOLIC PANEL WITH GFR - CBC with Differential - TSH - Sedimentation rate - DG Chest 2 View; Future  01/31/12 Over the weekend, I was notified of critical blood sugar of 476. I have no hemoglobin A1c.  However I started the patient on glipizide XL 10 mg by mouth daily. She's been drinking water since that time on Saturday. She is here today for confirmation as well as a hemoglobin A1c.   She is to stop drinking sodas. She is trying to monitor carbohydrates. She only had one dose of medication.  At that time, my plan was: 1. Hyperglycemia Patient  appears to have uncontrolled type 2 diabetes. - Hemoglobin W0J - BASIC METABOLIC PANEL WITH GFR - Ambulatory referral to diabetic education  2. Elevated liver function tests I will also check a viral hepatitis panel. Patient does have a history of blood transfusion as a child. - Hepatitis panel, acute  3. Type II or unspecified type diabetes mellitus without mention of complication, uncontrolled Check hemoglobin A1c today. Begin glipizide XL 10 mg by mouth daily. Once, I have the result of her hemoglobin A1c I will better be able to determine what medication she will need to control her diabetes. She may require insulin. She will likely need 2-3 medications in addition to glipizide.  I will also set her up appointment to see a diabetic nurse educator.    02/02/13 Her A1c returned greater than 14. The patient is totally adamant that she does not want to use Lantus. She is deathly afraid of needles.  Furthermore she lives on a limited income and cannot name brand medication. She is here today to discuss other options to control her blood sugars.  Past Medical History  Diagnosis Date  . Anxiety   . Arrhythmia   . Chronic headaches   . Depression   . IBS (irritable bowel syndrome) 1986  . GERD (gastroesophageal reflux disease)   . Hard of hearing   . Diabetes mellitus without complication    Current Outpatient Prescriptions on File  Prior to Visit  Medication Sig Dispense Refill  . buPROPion (WELLBUTRIN SR) 150 MG 12 hr tablet TAKE 1 TABLET BY MOUTH TWICE DAILY  180 tablet  3  . oxybutynin (DITROPAN) 5 MG tablet Take 1 tablet (5 mg total) by mouth 3 (three) times daily.  90 tablet  5  . triamcinolone cream (KENALOG) 0.1 % Apply 1 application topically 2 (two) times daily.  30 g  0  . cetirizine (ZYRTEC) 10 MG tablet Take 1 tablet (10 mg total) by mouth daily.  90 tablet  3   Current Facility-Administered Medications on File Prior to Visit  Medication Dose Route Frequency Provider Last  Rate Last Dose  . 0.9 %  sodium chloride infusion  500 mL Intravenous Continuous Jerene Bears, MD       Allergies  Allergen Reactions  . Contrast Media [Iodinated Diagnostic Agents] Nausea And Vomiting  . Penicillins Hives and Itching   History   Social History  . Marital Status: Widowed    Spouse Name: N/A    Number of Children: 4  . Years of Education: N/A   Occupational History  . Unemployed    Social History Main Topics  . Smoking status: Current Every Day Smoker -- 1.00 packs/day for 40 years    Types: Cigarettes  . Smokeless tobacco: Never Used  . Alcohol Use: No  . Drug Use: No  . Sexual Activity: Not on file   Other Topics Concern  . Not on file   Social History Narrative  . No narrative on file      Review of Systems  All other systems reviewed and are negative.       Objective:   Physical Exam  Vitals reviewed. Constitutional: She appears well-developed and well-nourished. No distress.  HENT:  Nose: Nose normal.  Mouth/Throat: Oropharynx is clear and moist. No oropharyngeal exudate.  Eyes: Conjunctivae are normal. Right eye exhibits no discharge. Left eye exhibits no discharge. No scleral icterus.  Neck: Normal range of motion. Neck supple. No JVD present. No thyromegaly present.  Cardiovascular: Normal rate, regular rhythm and normal heart sounds.  Exam reveals no gallop and no friction rub.   No murmur heard. Pulmonary/Chest: Effort normal and breath sounds normal. No respiratory distress. She has no wheezes. She has no rales.  Abdominal: Soft. Bowel sounds are normal. She exhibits no distension and no mass. There is no tenderness. There is no rebound and no guarding.  Musculoskeletal: She exhibits no edema.  Lymphadenopathy:    She has no cervical adenopathy.  Skin: Rash noted. She is not diaphoretic.    Office Visit on 01/30/2013  Component Date Value Range Status  . Hemoglobin A1C 01/30/2013 14.1* <5.7 % Final   Comment:                                                                                                  According to the ADA Clinical Practice Recommendations for 2011, when  HbA1c is used as a screening test:                                                       >=6.5%   Diagnostic of Diabetes Mellitus                                     (if abnormal result is confirmed)                                                     5.7-6.4%   Increased risk of developing Diabetes Mellitus                                                     References:Diagnosis and Classification of Diabetes Mellitus,Diabetes                          ZOXW,9604,54(UJWJX 1):S62-S69 and Standards of Medical Care in                                  Diabetes - 2011,Diabetes BJYN,8295,62 (Suppl 1):S11-S61.                             . Mean Plasma Glucose 01/30/2013 358* <117 mg/dL Final  . Sodium 01/30/2013 133* 135 - 145 mEq/L Final  . Potassium 01/30/2013 4.0  3.5 - 5.3 mEq/L Final  . Chloride 01/30/2013 96  96 - 112 mEq/L Final  . CO2 01/30/2013 25  19 - 32 mEq/L Final  . Glucose, Bld 01/30/2013 427* 70 - 99 mg/dL Final  . BUN 01/30/2013 10  6 - 23 mg/dL Final  . Creat 01/30/2013 0.68  0.50 - 1.10 mg/dL Final  . Calcium 01/30/2013 9.1  8.4 - 10.5 mg/dL Final  . GFR, Est African American 01/30/2013 >89   Final  . GFR, Est Non African American 01/30/2013 >89   Final   Comment:                            The estimated GFR is a calculation valid for adults (>=86 years old)                          that uses the CKD-EPI algorithm to adjust for age and sex. It is                            not to be used for children, pregnant women, hospitalized patients,                             patients on dialysis, or with rapidly changing kidney  function.                          According to the NKDEP, eGFR >89 is normal, 60-89 shows mild                          impairment, 30-59 shows moderate impairment, 15-29 shows severe                           impairment and <15 is ESRD.                             Marland Kitchen Hepatitis B Surface Ag 01/30/2013 NEGATIVE  NEGATIVE Final  . HCV Ab 01/30/2013 NEGATIVE  NEGATIVE Final  . Hep B C IgM 01/30/2013 NON REACTIVE  NON REACTIVE Final   Comment: High levels of Hepatitis B Core IgM antibody are detectable                          during the acute stage of Hepatitis B. This antibody is used                          to differentiate current from past HBV infection.                             . Hep A IgM 01/30/2013 NON REACTIVE  NON REACTIVE Final  Office Visit on 01/27/2013  Component Date Value Range Status  . Sodium 01/27/2013 133* 135 - 145 mEq/L Final  . Potassium 01/27/2013 3.9  3.5 - 5.3 mEq/L Final  . Chloride 01/27/2013 95* 96 - 112 mEq/L Final  . CO2 01/27/2013 24  19 - 32 mEq/L Final  . Glucose, Bld 01/27/2013 476* 70 - 99 mg/dL Final  . BUN 01/27/2013 7  6 - 23 mg/dL Final  . Creat 01/27/2013 0.73  0.50 - 1.10 mg/dL Final  . Total Bilirubin 01/27/2013 0.6  0.3 - 1.2 mg/dL Final  . Alkaline Phosphatase 01/27/2013 186* 39 - 117 U/L Final  . AST 01/27/2013 96* 0 - 37 U/L Final  . ALT 01/27/2013 100* 0 - 35 U/L Final  . Total Protein 01/27/2013 7.5  6.0 - 8.3 g/dL Final  . Albumin 01/27/2013 4.0  3.5 - 5.2 g/dL Final  . Calcium 01/27/2013 9.3  8.4 - 10.5 mg/dL Final  . GFR, Est African American 01/27/2013 >89   Final  . GFR, Est Non African American 01/27/2013 89   Final   Comment:                            The estimated GFR is a calculation valid for adults (>=108 years old)                          that uses the CKD-EPI algorithm to adjust for age and sex. It is                            not to be used for children, pregnant women, hospitalized patients,  patients on dialysis, or with rapidly changing kidney function.                          According to the NKDEP, eGFR >89 is normal, 60-89 shows mild                          impairment,  30-59 shows moderate impairment, 15-29 shows severe                          impairment and <15 is ESRD.                             . WBC 01/27/2013 10.1  4.0 - 10.5 K/uL Final  . RBC 01/27/2013 5.08  3.87 - 5.11 MIL/uL Final  . Hemoglobin 01/27/2013 15.6* 12.0 - 15.0 g/dL Final  . HCT 01/27/2013 45.4  36.0 - 46.0 % Final  . MCV 01/27/2013 89.4  78.0 - 100.0 fL Final  . MCH 01/27/2013 30.7  26.0 - 34.0 pg Final  . MCHC 01/27/2013 34.4  30.0 - 36.0 g/dL Final  . RDW 01/27/2013 13.3  11.5 - 15.5 % Final  . Platelets 01/27/2013 293  150 - 400 K/uL Final  . Neutrophils Relative % 01/27/2013 67  43 - 77 % Final  . Neutro Abs 01/27/2013 6.7  1.7 - 7.7 K/uL Final  . Lymphocytes Relative 01/27/2013 25  12 - 46 % Final  . Lymphs Abs 01/27/2013 2.5  0.7 - 4.0 K/uL Final  . Monocytes Relative 01/27/2013 6  3 - 12 % Final  . Monocytes Absolute 01/27/2013 0.6  0.1 - 1.0 K/uL Final  . Eosinophils Relative 01/27/2013 1  0 - 5 % Final  . Eosinophils Absolute 01/27/2013 0.1  0.0 - 0.7 K/uL Final  . Basophils Relative 01/27/2013 1  0 - 1 % Final  . Basophils Absolute 01/27/2013 0.1  0.0 - 0.1 K/uL Final  . Smear Review 01/27/2013 Criteria for review not met   Final  . TSH 01/27/2013 0.549  0.350 - 4.500 uIU/mL Final  . Sed Rate 01/27/2013 15  0 - 22 mm/hr Final         Assessment & Plan:  1. Type II or unspecified type diabetes mellitus without mention of complication, uncontrolled Given how high her hemoglobin A1c is, I did notify the patient I was not sure we can control her diabetes with pills one. However try as best we can. Begin metformin 1000 mg by mouth twice a day. Add Actos 30 mg by mouth daily. Continue glipizide XL 10 mg by mouth daily. Begin checking fasting blood sugars and 2 hour postprandial sugars and recheck here with me in one month to further titrate medications.  She is to schedule appointment with diabetic nurse educator. - pioglitazone (ACTOS) 30 MG tablet; Take 1 tablet (30 mg  total) by mouth daily.  Dispense: 30 tablet; Refill: 5 - metFORMIN (GLUCOPHAGE) 1000 MG tablet; Take 1 tablet (1,000 mg total) by mouth 2 (two) times daily with a meal.  Dispense: 180 tablet; Refill: 3

## 2013-02-10 ENCOUNTER — Encounter: Payer: Medicare Other | Attending: Family Medicine

## 2013-02-10 VITALS — Ht 63.0 in | Wt 143.9 lb

## 2013-02-10 DIAGNOSIS — Z713 Dietary counseling and surveillance: Secondary | ICD-10-CM | POA: Insufficient documentation

## 2013-02-10 DIAGNOSIS — IMO0001 Reserved for inherently not codable concepts without codable children: Secondary | ICD-10-CM | POA: Diagnosis present

## 2013-02-10 DIAGNOSIS — E1165 Type 2 diabetes mellitus with hyperglycemia: Principal | ICD-10-CM

## 2013-02-15 NOTE — Progress Notes (Signed)
Patient was seen on 02/10/13 for the first of a series of three diabetes self-management courses at the [facility].  Current HbA1c: 14.1%  The following learning objectives were met by the patient during this class:  Describe diabetes  State some common risk factors for diabetes  Defines the role of glucose and insulin  Identifies type of diabetes and pathophysiology  Describe the relationship between diabetes and cardiovascular risk  State the members of the Healthcare Team  States the rationale for glucose monitoring  State when to test glucose  State their individual Target Range  State the importance of logging glucose readings  Describe how to interpret glucose readings  Identifies A1C target  Explain the correlation between A1c and eAG values  State symptoms and treatment of high blood glucose  State symptoms and treatment of low blood glucose  Explain proper technique for glucose testing  Identifies proper sharps disposal  Handouts given during class include:  Living Well with Diabetes book  Carb Counting and Meal Planning book  Meal Plan Card  Carbohydrate guide  Meal planning worksheet  Low Sodium Flavoring Tips  The diabetes portion plate  Y5X to eAG Conversion Chart  Diabetes Medications  Diabetes Recommended Care Schedule  Support Group  Diabetes Success Plan  Core Class Satisfaction Survey  Follow-Up Plan:  Attend core 2

## 2013-02-16 ENCOUNTER — Ambulatory Visit (INDEPENDENT_AMBULATORY_CARE_PROVIDER_SITE_OTHER): Payer: Medicare HMO | Admitting: Family Medicine

## 2013-02-16 ENCOUNTER — Encounter: Payer: Self-pay | Admitting: Family Medicine

## 2013-02-16 VITALS — BP 130/78 | HR 88 | Temp 97.9°F | Resp 18 | Ht 63.0 in | Wt 144.0 lb

## 2013-02-16 DIAGNOSIS — IMO0001 Reserved for inherently not codable concepts without codable children: Secondary | ICD-10-CM

## 2013-02-16 DIAGNOSIS — E1165 Type 2 diabetes mellitus with hyperglycemia: Principal | ICD-10-CM

## 2013-02-16 NOTE — Progress Notes (Signed)
Subjective:    Patient ID: Rhonda Fields, female    DOB: Sep 09, 1950, 63 y.o.   MRN: [id number]  HPI 01/28/12 Patient is a very pleasant 63 year old female who presents with severe hair loss over the last 2 months. It is literally coming out in clumps when she washes her .  Her is extremely thin on the top of his nose on the left temple. She is also lost approximately 15 pounds since her last office visit in August. This is unintentional..  she has not had a mammogram in over a decade and she refuses another mammogram. Her last colonoscopy and EGD were less than 2 years ago. She has a history of a hysterectomy and therefore does not require Pap smears. She is a smoker. She denies cough shortness of breath or hemoptysis.  She attributes the head loss the recent addition of oxybutynin.  At that time, my plan was:  1. Loss of weight Patient has lost substantial weight over the last 3 months unintentionally. Given this and the significant alopecia I am concerned about possible malignancy somewhere in the body of the check a CBC, CMP, TSH, sedimentation rate, and chest x-ray. I also recommended a mammogram but the patient refused. Await results of lab work prior to deciding on further workup.  The patient also has an eczematous form rash in both ear canals. I prescribed triamcinolone 0.1% cream to be applied twice a day for one week this rash. - COMPLETE METABOLIC PANEL WITH GFR - CBC with Differential - TSH - Sedimentation rate - DG Chest 2 View; Future  01/31/12 Over the weekend, I was notified of critical blood sugar of 476. I have no hemoglobin A1c.  However I started the patient on glipizide XL 10 mg by mouth daily. She's been drinking water since that time on Saturday. She is here today for confirmation as well as a hemoglobin A1c.   She is to stop drinking sodas. She is trying to monitor carbohydrates. She only had one dose of medication.  At that time, my plan was: 1. Hyperglycemia Patient  appears to have uncontrolled type 2 diabetes. - Hemoglobin Z6W - BASIC METABOLIC PANEL WITH GFR - Ambulatory referral to diabetic education  2. Elevated liver function tests I will also check a viral hepatitis panel. Patient does have a history of blood transfusion as a child. - Hepatitis panel, acute  3. Type II or unspecified type diabetes mellitus without mention of complication, uncontrolled Check hemoglobin A1c today. Begin glipizide XL 10 mg by mouth daily. Once, I have the result of her hemoglobin A1c I will better be able to determine what medication she will need to control her diabetes. She may require insulin. She will likely need 2-3 medications in addition to glipizide.  I will also set her up appointment to see a diabetic nurse educator.    02/02/13 Her A1c returned greater than 14. The patient is totally adamant that she does not want to use Lantus. She is deathly afraid of needles.  Furthermore she lives on a limited income and cannot name brand medication. She is here today to discuss other options to control her blood sugars.  At that time, my plan was:    1. Type II or unspecified type diabetes mellitus without mention of complication, uncontrolled Given how high her hemoglobin A1c is, I did notify the patient I was not sure we can control her diabetes with pills one. However try as best we can. Begin metformin 1000 mg by  mouth twice a day. Add Actos 30 mg by mouth daily. Continue glipizide XL 10 mg by mouth daily. Begin checking fasting blood sugars and 2 hour postprandial sugars and recheck here with me in one month to further titrate medications.  She is to schedule appointment with diabetic nurse educator. - pioglitazone (ACTOS) 30 MG tablet; Take 1 tablet (30 mg total) by mouth daily.  Dispense: 30 tablet; Refill: 5 - metFORMIN (GLUCOPHAGE) 1000 MG tablet; Take 1 tablet (1,000 mg total) by mouth 2 (two) times daily with a meal.  Dispense: 180 tablet; Refill:  3  02/16/13 Patient is here today for a recheck. Her fasting blood sugar has been ranging 70-130. Her 2 hour postprandial sugars have been ranging 100-180. All her sugars appear excellent. I am very proud of this patient. She is met with the diabetic nurse educator and has really tried to change her diet. He is not having any episodes of hypoglycemia as of yet.  Past Medical History  Diagnosis Date  . Anxiety   . Arrhythmia   . Chronic headaches   . Depression   . IBS (irritable bowel syndrome) 1986  . GERD (gastroesophageal reflux disease)   . Hard of hearing   . Diabetes mellitus without complication    Current Outpatient Prescriptions on File Prior to Visit  Medication Sig Dispense Refill  . Blood Glucose Monitoring Suppl (ACCU-CHEK AVIVA PLUS) W/DEVICE KIT Check BS bid DX 250.00  1 kit  0  . buPROPion (WELLBUTRIN SR) 150 MG 12 hr tablet TAKE 1 TABLET BY MOUTH TWICE DAILY  180 tablet  3  . cetirizine (ZYRTEC) 10 MG tablet Take 1 tablet (10 mg total) by mouth daily.  90 tablet  3  . glipiZIDE (GLUCOTROL XL) 10 MG 24 hr tablet Take 10 mg by mouth daily with breakfast.      . glucose blood (ACCU-CHEK AVIVA PLUS) test strip Check BS bid  Dx 250.00 - Also needs lancets disp - 100/11 refills  100 each  6  . metFORMIN (GLUCOPHAGE) 1000 MG tablet Take 1 tablet (1,000 mg total) by mouth 2 (two) times daily with a meal.  60 tablet  5  . oxybutynin (DITROPAN) 5 MG tablet Take 1 tablet (5 mg total) by mouth 3 (three) times daily.  90 tablet  5  . pantoprazole (PROTONIX) 40 MG tablet Take 1 tablet (40 mg total) by mouth daily.  90 tablet  3  . pioglitazone (ACTOS) 30 MG tablet Take 1 tablet (30 mg total) by mouth daily.  30 tablet  5  . triamcinolone cream (KENALOG) 0.1 % Apply 1 application topically 2 (two) times daily.  30 g  0   Current Facility-Administered Medications on File Prior to Visit  Medication Dose Route Frequency Provider Last Rate Last Dose  . 0.9 %  sodium chloride infusion  500  mL Intravenous Continuous Jerene Bears, MD       Allergies  Allergen Reactions  . Contrast Media [Iodinated Diagnostic Agents] Nausea And Vomiting  . Penicillins Hives and Itching   History   Social History  . Marital Status: Widowed    Spouse Name: N/A    Number of Children: 4  . Years of Education: N/A   Occupational History  . Unemployed    Social History Main Topics  . Smoking status: Current Every Day Smoker -- 1.00 packs/day for 40 years    Types: Cigarettes  . Smokeless tobacco: Never Used  . Alcohol Use: No  . Drug  Use: No  . Sexual Activity: Not on file   Other Topics Concern  . Not on file   Social History Narrative  . No narrative on file      Review of Systems  All other systems reviewed and are negative.       Objective:   Physical Exam  Vitals reviewed. Constitutional: She appears well-developed and well-nourished. No distress.  HENT:  Nose: Nose normal.  Mouth/Throat: Oropharynx is clear and moist. No oropharyngeal exudate.  Eyes: Conjunctivae are normal. Right eye exhibits no discharge. Left eye exhibits no discharge. No scleral icterus.  Neck: Normal range of motion. Neck supple. No JVD present. No thyromegaly present.  Cardiovascular: Normal rate, regular rhythm and normal heart sounds.  Exam reveals no gallop and no friction rub.   No murmur heard. Pulmonary/Chest: Effort normal and breath sounds normal. No respiratory distress. She has no wheezes. She has no rales.  Abdominal: Soft. Bowel sounds are normal. She exhibits no distension and no mass. There is no tenderness. There is no rebound and no guarding.  Musculoskeletal: She exhibits no edema.  Lymphadenopathy:    She has no cervical adenopathy.  Skin: Rash noted. She is not diaphoretic.    No visits with results within 1 Week(s) from this visit. Latest known visit with results is:  Office Visit on 01/30/2013  Component Date Value Range Status  . Hemoglobin A1C 01/30/2013 14.1*  <5.7 % Final   Comment:                                                                                                 According to the ADA Clinical Practice Recommendations for 2011, when                          HbA1c is used as a screening test:                                                       >=6.5%   Diagnostic of Diabetes Mellitus                                     (if abnormal result is confirmed)                                                     5.7-6.4%   Increased risk of developing Diabetes Mellitus  References:Diagnosis and Classification of Diabetes Mellitus,Diabetes                          [id number],6599,35(TSVXB 1):S62-S69 and Standards of Medical Care in                                  Diabetes - 2011,Diabetes [id number],23 (Suppl 1):S11-S61.                             . Mean Plasma Glucose 01/30/2013 358* <117 mg/dL Final  . Sodium 01/30/2013 133* 135 - 145 mEq/L Final  . Potassium 01/30/2013 4.0  3.5 - 5.3 mEq/L Final  . Chloride 01/30/2013 96  96 - 112 mEq/L Final  . CO2 01/30/2013 25  19 - 32 mEq/L Final  . Glucose, Bld 01/30/2013 427* 70 - 99 mg/dL Final  . BUN 01/30/2013 10  6 - 23 mg/dL Final  . Creat 01/30/2013 0.68  0.50 - 1.10 mg/dL Final  . Calcium 01/30/2013 9.1  8.4 - 10.5 mg/dL Final  . GFR, Est African American 01/30/2013 >89   Final  . GFR, Est Non African American 01/30/2013 >89   Final   Comment:                            The estimated GFR is a calculation valid for adults (>=11 years old)                          that uses the CKD-EPI algorithm to adjust for age and sex. It is                            not to be used for children, pregnant women, hospitalized patients,                             patients on dialysis, or with rapidly changing kidney function.                          According to the NKDEP, eGFR >89 is normal, 60-89 shows mild                          impairment, 30-59 shows  moderate impairment, 15-29 shows severe                          impairment and <15 is ESRD.                             Marland Kitchen Hepatitis B Surface Ag 01/30/2013 NEGATIVE  NEGATIVE Final  . HCV Ab 01/30/2013 NEGATIVE  NEGATIVE Final  . Hep B C IgM 01/30/2013 NON REACTIVE  NON REACTIVE Final   Comment: High levels of Hepatitis B Core IgM antibody are detectable                          during the acute stage of Hepatitis B. This antibody is used  to differentiate current from past HBV infection.                             . Hep A IgM 01/30/2013 NON REACTIVE  NON REACTIVE Final         Assessment & Plan:   1. Type II or unspecified type diabetes mellitus without mention of complication, uncontrolled Sugar values over the last 2 weeks or excellent. I am actually beginning to wear the patient may experience hypoglycemia. I recommended he continue to check fasting blood sugars and two-hour postprandial sugars over the next 2-3 months. We'll repeat hemoglobin A1c in 3 months. The patient began experiencing hypoglycemic events we will discontinue glipizide. The patient understands the symptoms of hypoglycemia and knows when to call me and how to treat it if it begins to happen.

## 2013-02-20 ENCOUNTER — Telehealth: Payer: Self-pay | Admitting: Family Medicine

## 2013-02-20 NOTE — Telephone Encounter (Signed)
Pharmacy is [facility] on [address] back number is [phone] Pt is needing a refill on her glipizide

## 2013-02-21 MED ORDER — GLIPIZIDE ER 10 MG PO TB24: 10.0000 mg | ORAL_TABLET | Freq: Every day | ORAL | Status: DC

## 2013-02-21 NOTE — Telephone Encounter (Signed)
Rx Refilled  

## 2013-02-24 ENCOUNTER — Encounter: Payer: Medicare HMO | Attending: Family Medicine

## 2013-02-24 DIAGNOSIS — E1165 Type 2 diabetes mellitus with hyperglycemia: Principal | ICD-10-CM

## 2013-02-24 DIAGNOSIS — IMO0001 Reserved for inherently not codable concepts without codable children: Secondary | ICD-10-CM

## 2013-02-24 DIAGNOSIS — Z713 Dietary counseling and surveillance: Secondary | ICD-10-CM | POA: Insufficient documentation

## 2013-02-27 ENCOUNTER — Other Ambulatory Visit: Payer: Self-pay | Admitting: Family Medicine

## 2013-02-27 MED ORDER — GLIPIZIDE ER 10 MG PO TB24: 10.0000 mg | ORAL_TABLET | Freq: Every day | ORAL | Status: DC

## 2013-02-27 MED ORDER — GLUCOSE BLOOD VI STRP: ORAL_STRIP | Status: DC

## 2013-02-27 MED ORDER — ACCU-CHEK SOFTCLIX LANCETS MISC: Status: DC

## 2013-02-28 ENCOUNTER — Telehealth: Payer: Self-pay | Admitting: Family Medicine

## 2013-02-28 NOTE — Telephone Encounter (Signed)
Wellbutirn (bupropion) not on her insurance formulary. Very expensive.  Pharmacist has recommended replacing with Nortriptyline.  Is that an option?? If so will need to send new Rx to [facility] mail order.  Call pt back and let her know.

## 2013-02-28 NOTE — Progress Notes (Signed)
Patient was seen on 02/24/13 for the third of a series of three diabetes self-management courses at the [facility]. The following learning objectives were met by the patient during this class:    State the amount of activity recommended for healthy living   Describe activities suitable for individual needs   Identify ways to regularly incorporate activity into daily life   Identify barriers to activity and ways to over come these barriers  Identify diabetes medications being personally used and their primary action for lowering glucose and possible side effects   Describe role of stress on blood glucose and develop strategies to address psychosocial issues   Identify diabetes complications and ways to prevent them  Explain how to manage diabetes during illness   Evaluate success in meeting personal goal   Establish 2-3 goals that they will plan to diligently work on until they return for the  75-monthfollow-up visit  Goals:  Follow Diabetes Meal Plan as instructed  Aim for 15-30 mins of physical activity daily as tolerated  Bring food record and glucose log to your follow up visit  Your patient has established the following 4 month goals in their individualized success plan:  Count carbohydrates at most meals and snacks  Increase activity at least 2 days a week  Take DM medications as scheduled  Your patient has identified these potential barriers to change:  None noted  Your patient has identified their diabetes self-care support plan as  [facility]

## 2013-03-01 MED ORDER — FLUOXETINE HCL 20 MG PO TABS: 20.0000 mg | ORAL_TABLET | Freq: Every day | ORAL | Status: DC

## 2013-03-01 NOTE — Telephone Encounter (Signed)
Spoke to patient.  Told to wean off Wellbutrin by taking once daily for two weeks then stop.  At same time start Prozac 20 mg daily.  Rx sent to mail order.  Pt told do not start transition until gets new med from mail order.

## 2013-03-01 NOTE — Telephone Encounter (Signed)
lmtrc

## 2013-03-01 NOTE — Telephone Encounter (Signed)
I would suggest weaning down on wellbutrin to 150 mg poqday for 2 weeks then stop and starting prozac 20 mg poqday which is generic.

## 2013-03-08 ENCOUNTER — Telehealth: Payer: Self-pay | Admitting: *Deleted

## 2013-03-08 NOTE — Telephone Encounter (Signed)
Received VM from pt. Returned call.   Reported that FSBS noted at 59 on 03/07/2013. Stated that she ate high sugar content food and it came back up. Stated that MD contacted and he advised to stop Glipizide at this time. Stated that MD advised if FSBS elevated >200 in the AM, to begin taking 1/2 tab of Glipizide.   Also reported that she received call from [facility] with directions to begin Prozac. Stated that medication is expensive, and she can not afford it. Requested to make MD aware.

## 2013-03-09 NOTE — Telephone Encounter (Signed)
Isn't prozac on $4 list.  That is probably the cheapest generic available.

## 2013-03-09 NOTE — Telephone Encounter (Signed)
Pt was on wellbutrin to help stop smoking not for depression and therefore does not want to try prozac.

## 2013-04-17 ENCOUNTER — Ambulatory Visit (INDEPENDENT_AMBULATORY_CARE_PROVIDER_SITE_OTHER): Payer: Medicare HMO | Admitting: Family Medicine

## 2013-04-17 ENCOUNTER — Encounter: Payer: Self-pay | Admitting: Family Medicine

## 2013-04-17 VITALS — BP 132/80 | HR 72 | Temp 97.2°F | Resp 18 | Ht 63.0 in | Wt 148.0 lb

## 2013-04-17 DIAGNOSIS — E119 Type 2 diabetes mellitus without complications: Secondary | ICD-10-CM

## 2013-04-17 LAB — COMPLETE METABOLIC PANEL WITH GFR
ALT: 20 U/L (ref 0–35)
AST: 23 U/L (ref 0–37)
Albumin: 4.2 g/dL (ref 3.5–5.2)
Alkaline Phosphatase: 104 U/L (ref 39–117)
BUN: 11 mg/dL (ref 6–23)
CALCIUM: 9.7 mg/dL (ref 8.4–10.5)
CHLORIDE: 101 meq/L (ref 96–112)
CO2: 26 mEq/L (ref 19–32)
CREATININE: 0.77 mg/dL (ref 0.50–1.10)
GFR, Est Non African American: 84 mL/min
Glucose, Bld: 100 mg/dL — ABNORMAL HIGH (ref 70–99)
Potassium: 4.7 mEq/L (ref 3.5–5.3)
Sodium: 142 mEq/L (ref 135–145)
Total Bilirubin: 0.4 mg/dL (ref 0.2–1.2)
Total Protein: 7.4 g/dL (ref 6.0–8.3)

## 2013-04-17 LAB — LIPID PANEL
Cholesterol: 212 mg/dL — ABNORMAL HIGH (ref 0–200)
HDL: 55 mg/dL (ref >39)
LDL Cholesterol: 111 mg/dL — ABNORMAL HIGH (ref 0–99)
Total CHOL/HDL Ratio: 3.9 Ratio
Triglycerides: 232 mg/dL — ABNORMAL HIGH (ref <150)
VLDL: 46 mg/dL — ABNORMAL HIGH (ref 0–40)

## 2013-04-17 LAB — HEMOGLOBIN A1C
HEMOGLOBIN A1C: 6.6 % — AB (ref <5.7)
Mean Plasma Glucose: 143 mg/dL — ABNORMAL HIGH (ref <117)

## 2013-04-17 LAB — MICROALBUMIN, URINE

## 2013-04-17 MED ORDER — ALPRAZOLAM 0.5 MG PO TABS: 0.5000 mg | ORAL_TABLET | Freq: Three times a day (TID) | ORAL | Status: DC | PRN

## 2013-04-17 MED ORDER — PANTOPRAZOLE SODIUM 40 MG PO TBEC: 40.0000 mg | DELAYED_RELEASE_TABLET | Freq: Two times a day (BID) | ORAL | Status: DC

## 2013-04-17 NOTE — Progress Notes (Signed)
Subjective:    Patient ID: Rhonda Fields, female    DOB: 1950/04/15, 63 y.o.   MRN: [id number]  HPI Patient is here today for followup of her diabetes mellitus. She is currently on Actos 30 mg by mouth daily and metformin 1000 mg by mouth twice a day. She's been off the glipizide for over a month. Her fasting blood sugars range 80-110. The postprandial sugars are below 130. She denies any hypoglycemic spells. She continues to watch her diet. She denies any chest pain shortness of breath or dyspnea on exertion. She denies any neuropathy. She recently saw the eye doctor and had her eye examination. Past Medical History  Diagnosis Date  . Anxiety   . Arrhythmia   . Chronic headaches   . Depression   . IBS (irritable bowel syndrome) 1986  . GERD (gastroesophageal reflux disease)   . Hard of hearing   . Diabetes mellitus without complication    Current Outpatient Prescriptions on File Prior to Visit  Medication Sig Dispense Refill  . ACCU-CHEK SOFTCLIX LANCETS lancets Check bs bid dx-250.00  200 each  3  . Blood Glucose Monitoring Suppl (ACCU-CHEK AVIVA PLUS) W/DEVICE KIT Check BS bid DX 250.00  1 kit  0  . glucose blood (ACCU-CHEK AVIVA PLUS) test strip Check BS bid  Dx 250.00 - Also needs lancets disp - 100/11 refills  100 each  6  . metFORMIN (GLUCOPHAGE) 1000 MG tablet Take 1 tablet (1,000 mg total) by mouth 2 (two) times daily with a meal.  60 tablet  5  . pioglitazone (ACTOS) 30 MG tablet Take 1 tablet (30 mg total) by mouth daily.  30 tablet  5  . triamcinolone cream (KENALOG) 0.1 % Apply 1 application topically 2 (two) times daily.  30 g  0   Current Facility-Administered Medications on File Prior to Visit  Medication Dose Route Frequency Provider Last Rate Last Dose  . 0.9 %  sodium chloride infusion  500 mL Intravenous Continuous Jerene Bears, MD       Allergies  Allergen Reactions  . Contrast Media [Iodinated Diagnostic Agents] Nausea And Vomiting  . Penicillins Hives  and Itching   History   Social History  . Marital Status: Widowed    Spouse Name: N/A    Number of Children: 4  . Years of Education: N/A   Occupational History  . Unemployed    Social History Main Topics  . Smoking status: Current Every Day Smoker -- 1.00 packs/day for 40 years    Types: Cigarettes  . Smokeless tobacco: Never Used  . Alcohol Use: No  . Drug Use: No  . Sexual Activity: Not on file   Other Topics Concern  . Not on file   Social History Narrative  . No narrative on file      Review of Systems  All other systems reviewed and are negative.       Objective:   Physical Exam  Vitals reviewed. Constitutional: She appears well-developed and well-nourished.  Cardiovascular: Normal rate, regular rhythm, normal heart sounds and intact distal pulses.  Exam reveals no gallop and no friction rub.   No murmur heard. Pulmonary/Chest: Effort normal and breath sounds normal. No respiratory distress. She has no wheezes. She has no rales.  Abdominal: Soft. Bowel sounds are normal. She exhibits no distension. There is no tenderness. There is no rebound and no guarding.  Musculoskeletal: She exhibits no edema.          Assessment &  Plan:  1. Type II or unspecified type diabetes mellitus without mention of complication, not stated as uncontrolled Check CMP, fasting lipid panel, urine microalbumin, and hemoglobin A1c. If the patient has elevated urine microalbumin I would add Diovan 80 mg by mouth daily. I recommended she begin taking aspirin 81 mg by mouth daily. Check a fasting lipid panel. Her goal LDL would be less than 100.  We discussed the risk of bladder cancer on Actos and she elects to continue the medication.  Her diabetic eye exam is up-to-date. - COMPLETE METABOLIC PANEL WITH GFR - Hemoglobin A1c - Lipid panel - Microalbumin, urine Patient wants to quit smoking. She likes use Xanax 0.5 mg taken every 8 hours as needed for anxiety when she quits smoking.  I gave the patient 30 tablets 0 refills.

## 2013-04-19 ENCOUNTER — Other Ambulatory Visit: Payer: Self-pay | Admitting: Family Medicine

## 2013-04-19 MED ORDER — PRAVASTATIN SODIUM 20 MG PO TABS: 20.0000 mg | ORAL_TABLET | Freq: Every day | ORAL | Status: DC

## 2013-05-22 ENCOUNTER — Ambulatory Visit: Payer: Medicare Other

## 2013-07-29 ENCOUNTER — Other Ambulatory Visit: Payer: Self-pay | Admitting: *Deleted

## 2013-07-29 DIAGNOSIS — E119 Type 2 diabetes mellitus without complications: Secondary | ICD-10-CM

## 2013-07-29 DIAGNOSIS — E785 Hyperlipidemia, unspecified: Secondary | ICD-10-CM

## 2013-08-10 ENCOUNTER — Encounter: Payer: Self-pay | Admitting: Family Medicine

## 2013-08-10 ENCOUNTER — Ambulatory Visit (INDEPENDENT_AMBULATORY_CARE_PROVIDER_SITE_OTHER): Payer: Medicare HMO | Admitting: Family Medicine

## 2013-08-10 VITALS — BP 120/78 | HR 80 | Temp 97.2°F | Resp 18 | Ht 63.0 in | Wt 155.0 lb

## 2013-08-10 DIAGNOSIS — E119 Type 2 diabetes mellitus without complications: Secondary | ICD-10-CM

## 2013-08-10 MED ORDER — HYDROCODONE-ACETAMINOPHEN 5-325 MG PO TABS: 1.0000 | ORAL_TABLET | Freq: Four times a day (QID) | ORAL | Status: DC | PRN

## 2013-08-10 NOTE — Progress Notes (Signed)
Subjective:    Patient ID: Rhonda Fields, female    DOB: [birth year], 63 y.o.   MRN: [id number]  HPI Patient has a history of diabetes mellitus type 2. She is currently taking Actos as well as metformin. Her blood sugars are well controlled. Her fasting blood sugars are less than 110. Her two-hour postprandial sugars are less than 130. She is having no hypoglycemic episodes. She is working very hard to control her diet with her nutritionist. Her blood pressures well controlled 120/78. She denies any chest pain shortness of breath or dyspnea on exertion. She is overdue for fasting lipid panel. She is taking an aspirin every day. She's also had her annual diabetic eye exam. Past Medical History  Diagnosis Date  . Anxiety   . Arrhythmia   . Chronic headaches   . Depression   . IBS (irritable bowel syndrome) 1986  . GERD (gastroesophageal reflux disease)   . Hard of hearing   . Diabetes mellitus without complication    Current Outpatient Prescriptions on File Prior to Visit  Medication Sig Dispense Refill  . ACCU-CHEK SOFTCLIX LANCETS lancets Check bs bid dx-250.00  200 each  3  . ALPRAZolam (XANAX) 0.5 MG tablet Take 1 tablet (0.5 mg total) by mouth 3 (three) times daily as needed for anxiety.  30 tablet  0  . Blood Glucose Monitoring Suppl (ACCU-CHEK AVIVA PLUS) W/DEVICE KIT Check BS bid DX 250.00  1 kit  0  . glipiZIDE (GLUCOTROL XL) 10 MG 24 hr tablet 1/2 po qd prn elevated bs      . glucose blood (ACCU-CHEK AVIVA PLUS) test strip Check BS bid  Dx 250.00 - Also needs lancets disp - 100/11 refills  100 each  6  . metFORMIN (GLUCOPHAGE) 1000 MG tablet Take 1 tablet (1,000 mg total) by mouth 2 (two) times daily with a meal.  60 tablet  5  . pantoprazole (PROTONIX) 40 MG tablet Take 1 tablet (40 mg total) by mouth 2 (two) times daily.  180 tablet  3  . pioglitazone (ACTOS) 30 MG tablet Take 1 tablet (30 mg total) by mouth daily.  30 tablet  5  . pravastatin (PRAVACHOL) 20 MG tablet  Take 1 tablet (20 mg total) by mouth daily.  90 tablet  3  . triamcinolone cream (KENALOG) 0.1 % Apply 1 application topically 2 (two) times daily.  30 g  0   Current Facility-Administered Medications on File Prior to Visit  Medication Dose Route Frequency Provider Last Rate Last Dose  . 0.9 %  sodium chloride infusion  500 mL Intravenous Continuous Jerene Bears, MD       Allergies  Allergen Reactions  . Contrast Media [Iodinated Diagnostic Agents] Nausea And Vomiting  . Penicillins Hives and Itching   History   Social History  . Marital Status: Widowed    Spouse Name: N/A    Number of Children: 4  . Years of Education: N/A   Occupational History  . Unemployed    Social History Main Topics  . Smoking status: Current Every Day Smoker -- 1.00 packs/day for 40 years    Types: Cigarettes  . Smokeless tobacco: Never Used  . Alcohol Use: No  . Drug Use: No  . Sexual Activity: Not on file   Other Topics Concern  . Not on file   Social History Narrative  . No narrative on file      Review of Systems  All other systems reviewed and are negative.  Objective:   Physical Exam  Vitals reviewed. Cardiovascular: Normal rate, regular rhythm and normal heart sounds.   No murmur heard. Pulmonary/Chest: Effort normal and breath sounds normal. No respiratory distress. She has no wheezes. She has no rales.  Abdominal: Soft. Bowel sounds are normal. She exhibits no distension. There is no tenderness. There is no rebound and no guarding.  Musculoskeletal: She exhibits no edema.          Assessment & Plan:  1. Type II or unspecified type diabetes mellitus without mention of complication, not stated as uncontrolled Return fasting for a CMP, fasting lipid panel, hemoglobin A1c, and urine microalbumin. Her blood pressure is excellent. Her blood sugar checks have all been within normal range.Marland Kitchen

## 2013-08-11 ENCOUNTER — Other Ambulatory Visit: Payer: Medicare HMO

## 2013-08-11 LAB — LIPID PANEL
CHOL/HDL RATIO: 3.3 ratio
Cholesterol: 163 mg/dL (ref 0–200)
HDL: 50 mg/dL (ref >39)
LDL CALC: 73 mg/dL (ref 0–99)
Triglycerides: 198 mg/dL — ABNORMAL HIGH (ref <150)
VLDL: 40 mg/dL (ref 0–40)

## 2013-08-11 LAB — COMPLETE METABOLIC PANEL WITH GFR
ALBUMIN: 4 g/dL (ref 3.5–5.2)
ALT: 19 U/L (ref 0–35)
AST: 21 U/L (ref 0–37)
Alkaline Phosphatase: 63 U/L (ref 39–117)
BUN: 16 mg/dL (ref 6–23)
CO2: 24 mEq/L (ref 19–32)
Calcium: 9.6 mg/dL (ref 8.4–10.5)
Chloride: 104 mEq/L (ref 96–112)
Creat: 0.84 mg/dL (ref 0.50–1.10)
GFR, EST NON AFRICAN AMERICAN: 75 mL/min
GFR, Est African American: 87 mL/min
GLUCOSE: 108 mg/dL — AB (ref 70–99)
POTASSIUM: 4.5 meq/L (ref 3.5–5.3)
SODIUM: 140 meq/L (ref 135–145)
Total Bilirubin: 0.4 mg/dL (ref 0.2–1.2)
Total Protein: 7 g/dL (ref 6.0–8.3)

## 2013-08-11 LAB — HEMOGLOBIN A1C
Hgb A1c MFr Bld: 6.1 % — ABNORMAL HIGH (ref <5.7)
MEAN PLASMA GLUCOSE: 128 mg/dL — AB (ref <117)

## 2013-08-11 LAB — MICROALBUMIN, URINE: Microalb, Ur: 0.91 mg/dL (ref 0.00–1.89)

## 2013-08-30 ENCOUNTER — Telehealth: Payer: Self-pay | Admitting: *Deleted

## 2013-08-30 DIAGNOSIS — E1165 Type 2 diabetes mellitus with hyperglycemia: Principal | ICD-10-CM

## 2013-08-30 DIAGNOSIS — IMO0001 Reserved for inherently not codable concepts without codable children: Secondary | ICD-10-CM

## 2013-08-30 MED ORDER — PIOGLITAZONE HCL 30 MG PO TABS: 30.0000 mg | ORAL_TABLET | Freq: Every day | ORAL | Status: DC

## 2013-08-30 NOTE — Telephone Encounter (Signed)
Pt called stating needing refill on Actos one has one left, pt wanting to know if can send 30 day to local pharmacy [facility] and the other 90 day to her mail order. [facility] and to [facility] mail order at pt request.

## 2013-10-02 ENCOUNTER — Telehealth: Payer: Self-pay | Admitting: Family Medicine

## 2013-10-02 DIAGNOSIS — E1165 Type 2 diabetes mellitus with hyperglycemia: Principal | ICD-10-CM

## 2013-10-02 DIAGNOSIS — IMO0001 Reserved for inherently not codable concepts without codable children: Secondary | ICD-10-CM

## 2013-10-02 MED ORDER — PIOGLITAZONE HCL 30 MG PO TABS: 30.0000 mg | ORAL_TABLET | Freq: Every day | ORAL | Status: DC

## 2013-10-02 NOTE — Telephone Encounter (Signed)
Med sent to pharm 

## 2013-10-02 NOTE — Telephone Encounter (Signed)
Patient needs refill on pipotiazine 30 mg to be called into the [facility]  [phone]

## 2013-11-17 ENCOUNTER — Other Ambulatory Visit: Payer: Medicare HMO

## 2013-11-17 DIAGNOSIS — E119 Type 2 diabetes mellitus without complications: Secondary | ICD-10-CM

## 2013-11-17 DIAGNOSIS — E785 Hyperlipidemia, unspecified: Secondary | ICD-10-CM

## 2013-11-17 LAB — CBC WITH DIFFERENTIAL/PLATELET
BASOS PCT: 0 % (ref 0–1)
Basophils Absolute: 0 10*3/uL (ref 0.0–0.1)
Eosinophils Absolute: 0.1 10*3/uL (ref 0.0–0.7)
Eosinophils Relative: 1 % (ref 0–5)
HCT: 38.9 % (ref 36.0–46.0)
Hemoglobin: 13.3 g/dL (ref 12.0–15.0)
Lymphocytes Relative: 17 % (ref 12–46)
Lymphs Abs: 2.5 10*3/uL (ref 0.7–4.0)
MCH: 30.1 pg (ref 26.0–34.0)
MCHC: 34.2 g/dL (ref 30.0–36.0)
MCV: 88 fL (ref 78.0–100.0)
Monocytes Absolute: 0.9 10*3/uL (ref 0.1–1.0)
Monocytes Relative: 6 % (ref 3–12)
NEUTROS PCT: 76 % (ref 43–77)
Neutro Abs: 11.2 10*3/uL — ABNORMAL HIGH (ref 1.7–7.7)
PLATELETS: 274 10*3/uL (ref 150–400)
RBC: 4.42 MIL/uL (ref 3.87–5.11)
RDW: 13.9 % (ref 11.5–15.5)
WBC: 14.7 10*3/uL — ABNORMAL HIGH (ref 4.0–10.5)

## 2013-11-17 LAB — COMPLETE METABOLIC PANEL WITH GFR
ALBUMIN: 3.8 g/dL (ref 3.5–5.2)
ALT: 16 U/L (ref 0–35)
AST: 19 U/L (ref 0–37)
Alkaline Phosphatase: 66 U/L (ref 39–117)
BUN: 13 mg/dL (ref 6–23)
CALCIUM: 9.1 mg/dL (ref 8.4–10.5)
CHLORIDE: 106 meq/L (ref 96–112)
CO2: 27 mEq/L (ref 19–32)
Creat: 0.64 mg/dL (ref 0.50–1.10)
GFR, Est African American: >89 mL/min
GFR, Est Non African American: >89 mL/min
Glucose, Bld: 103 mg/dL — ABNORMAL HIGH (ref 70–99)
Potassium: 4.2 mEq/L (ref 3.5–5.3)
Sodium: 141 mEq/L (ref 135–145)
Total Bilirubin: 0.4 mg/dL (ref 0.2–1.2)
Total Protein: 6.6 g/dL (ref 6.0–8.3)

## 2013-11-17 LAB — LIPID PANEL
CHOLESTEROL: 160 mg/dL (ref 0–200)
HDL: 53 mg/dL (ref >39)
LDL Cholesterol: 81 mg/dL (ref 0–99)
Total CHOL/HDL Ratio: 3 Ratio
Triglycerides: 130 mg/dL (ref <150)
VLDL: 26 mg/dL (ref 0–40)

## 2013-11-23 ENCOUNTER — Ambulatory Visit (INDEPENDENT_AMBULATORY_CARE_PROVIDER_SITE_OTHER): Payer: Medicare HMO | Admitting: Family Medicine

## 2013-11-23 ENCOUNTER — Encounter: Payer: Self-pay | Admitting: Family Medicine

## 2013-11-23 VITALS — BP 130/80 | HR 78 | Temp 98.0°F | Resp 16 | Ht 63.0 in | Wt 157.0 lb

## 2013-11-23 DIAGNOSIS — E118 Type 2 diabetes mellitus with unspecified complications: Secondary | ICD-10-CM

## 2013-11-23 DIAGNOSIS — F418 Other specified anxiety disorders: Secondary | ICD-10-CM

## 2013-11-23 DIAGNOSIS — E119 Type 2 diabetes mellitus without complications: Secondary | ICD-10-CM

## 2013-11-23 DIAGNOSIS — E785 Hyperlipidemia, unspecified: Secondary | ICD-10-CM

## 2013-11-23 LAB — HEMOGLOBIN A1C
HEMOGLOBIN A1C: 6 % — AB (ref <5.7)
MEAN PLASMA GLUCOSE: 126 mg/dL — AB (ref <117)

## 2013-11-23 MED ORDER — PIOGLITAZONE HCL 30 MG PO TABS: 30.0000 mg | ORAL_TABLET | Freq: Every day | ORAL | Status: DC

## 2013-11-23 MED ORDER — PRAVASTATIN SODIUM 20 MG PO TABS: 20.0000 mg | ORAL_TABLET | Freq: Every day | ORAL | Status: DC

## 2013-11-23 MED ORDER — PANTOPRAZOLE SODIUM 40 MG PO TBEC: 40.0000 mg | DELAYED_RELEASE_TABLET | Freq: Two times a day (BID) | ORAL | Status: DC

## 2013-11-23 MED ORDER — METFORMIN HCL 1000 MG PO TABS: 1000.0000 mg | ORAL_TABLET | Freq: Two times a day (BID) | ORAL | Status: DC

## 2013-11-23 MED ORDER — ALPRAZOLAM 0.5 MG PO TABS: 0.5000 mg | ORAL_TABLET | Freq: Three times a day (TID) | ORAL | Status: DC | PRN

## 2013-11-23 NOTE — Progress Notes (Signed)
Subjective:    Patient ID: Rhonda Fields, female    DOB: 07-29-1950, 63 y.o.   MRN: [id number]  HPI  Patient is here today for recheck of her diabetes. Her fasting blood sugars have ranged 80-120. She denies any polyuria, polydipsia, or blurred vision. Her blood pressure is well controlled today at 130/80. She does continue to complain of some numbness in her feet this is episodic. At the present time she is a somatic. She denies any chest pain shortness of breath or dyspnea on exertion. She denies any myalgias or right upper quadrant pain. Her most recent lab work as listed below: Appointment on 11/17/2013  Component Date Value Ref Range Status  . Sodium 11/17/2013 141  135 - 145 mEq/L Final  . Potassium 11/17/2013 4.2  3.5 - 5.3 mEq/L Final  . Chloride 11/17/2013 106  96 - 112 mEq/L Final  . CO2 11/17/2013 27  19 - 32 mEq/L Final  . Glucose, Bld 11/17/2013 103* 70 - 99 mg/dL Final  . BUN 11/17/2013 13  6 - 23 mg/dL Final  . Creat 11/17/2013 0.64  0.50 - 1.10 mg/dL Final  . Total Bilirubin 11/17/2013 0.4  0.2 - 1.2 mg/dL Final  . Alkaline Phosphatase 11/17/2013 66  39 - 117 U/L Final  . AST 11/17/2013 19  0 - 37 U/L Final  . ALT 11/17/2013 16  0 - 35 U/L Final  . Total Protein 11/17/2013 6.6  6.0 - 8.3 g/dL Final  . Albumin 11/17/2013 3.8  3.5 - 5.2 g/dL Final  . Calcium 11/17/2013 9.1  8.4 - 10.5 mg/dL Final  . GFR, Est African American 11/17/2013 >89   Final  . GFR, Est Non African American 11/17/2013 >89   Final   Comment:                            The estimated GFR is a calculation valid for adults (>=32 years old)                          that uses the CKD-EPI algorithm to adjust for age and sex. It is                            not to be used for children, pregnant women, hospitalized patients,                             patients on dialysis, or with rapidly changing kidney function.                          According to the NKDEP, eGFR >89 is normal, 60-89 shows mild                           impairment, 30-59 shows moderate impairment, 15-29 shows severe                          impairment and <15 is ESRD.                             . WBC 11/17/2013 14.7* 4.0 - 10.5 K/uL Final  . RBC 11/17/2013 4.42  3.87 - 5.11 MIL/uL Final  .  Hemoglobin 11/17/2013 13.3  12.0 - 15.0 g/dL Final  . HCT 11/17/2013 38.9  36.0 - 46.0 % Final  . MCV 11/17/2013 88.0  78.0 - 100.0 fL Final  . MCH 11/17/2013 30.1  26.0 - 34.0 pg Final  . MCHC 11/17/2013 34.2  30.0 - 36.0 g/dL Final  . RDW 11/17/2013 13.9  11.5 - 15.5 % Final  . Platelets 11/17/2013 274  150 - 400 K/uL Final  . Neutrophils Relative % 11/17/2013 76  43 - 77 % Final  . Neutro Abs 11/17/2013 11.2* 1.7 - 7.7 K/uL Final  . Lymphocytes Relative 11/17/2013 17  12 - 46 % Final  . Lymphs Abs 11/17/2013 2.5  0.7 - 4.0 K/uL Final  . Monocytes Relative 11/17/2013 6  3 - 12 % Final  . Monocytes Absolute 11/17/2013 0.9  0.1 - 1.0 K/uL Final  . Eosinophils Relative 11/17/2013 1  0 - 5 % Final  . Eosinophils Absolute 11/17/2013 0.1  0.0 - 0.7 K/uL Final  . Basophils Relative 11/17/2013 0  0 - 1 % Final  . Basophils Absolute 11/17/2013 0.0  0.0 - 0.1 K/uL Final  . Smear Review 11/17/2013 Criteria for review not met   Final  . Cholesterol 11/17/2013 160  0 - 200 mg/dL Final   Comment: ATP III Classification:                                < 200        mg/dL        Desirable                               200 - 239     mg/dL        Borderline High                               >= 240        mg/dL        High                             . Triglycerides 11/17/2013 130  <150 mg/dL Final  . HDL 11/17/2013 53  >39 mg/dL Final  . Total CHOL/HDL Ratio 11/17/2013 3.0   Final  . VLDL 11/17/2013 26  0 - 40 mg/dL Final  . LDL Cholesterol 11/17/2013 81  0 - 99 mg/dL Final   Comment:                            Total Cholesterol/HDL Ratio:CHD Risk                                                 Coronary Heart Disease Risk Table                                                                  Men  Women                                   1/2 Average Risk              3.4        3.3                                       Average Risk              5.0        4.4                                    2X Average Risk              9.6        7.1                                    3X Average Risk             23.4       11.0                          Use the calculated Patient Ratio above and the CHD Risk table                           to determine the patient's CHD Risk.                          ATP III Classification (LDL):                                < 100        mg/dL         Optimal                               100 - 129     mg/dL         Near or Above Optimal                               130 - 159     mg/dL         Borderline High                               160 - 189     mg/dL         High                                > 190        mg/dL         Very High  Unfortunately, she is caring for her daughter who has severe bipolar. This is causing a tremendous stress in the patient. She has recently resumed smoking due to uncontrolled anxiety. Several times a week she will have panic attacks. Smoking is the only thing that helps calm her nerves. She is very frustrated with herself for having resumed smoking. Past Medical History  Diagnosis Date  . Anxiety   . Arrhythmia   . Chronic headaches   . Depression   . IBS (irritable bowel syndrome) 1986  . GERD (gastroesophageal reflux disease)   . Hard of hearing   . Diabetes mellitus without complication    Past Surgical History  Procedure Laterality Date  . Cholecystectomy    . Abdominal hysterectomy    . Ercp  04/20/2011    Procedure: ENDOSCOPIC RETROGRADE CHOLANGIOPANCREATOGRAPHY (ERCP);  Surgeon: Inda Castle, MD;  Location: [facility];  Service: Endoscopy;  Laterality: N/A;  case is at 1430 in or  . Ercp  04/20/2011    Procedure:  ENDOSCOPIC RETROGRADE CHOLANGIOPANCREATOGRAPHY (ERCP);  Surgeon: Inda Castle, MD;  Location: [facility];  Service: Gastroenterology;  Laterality: N/A;   Current Outpatient Prescriptions on File Prior to Visit  Medication Sig Dispense Refill  . ACCU-CHEK SOFTCLIX LANCETS lancets Check bs bid dx-250.00 200 each 3  . Blood Glucose Monitoring Suppl (ACCU-CHEK AVIVA PLUS) W/DEVICE KIT Check BS bid DX 250.00 1 kit 0  . glucose blood (ACCU-CHEK AVIVA PLUS) test strip Check BS bid  Dx 250.00 - Also needs lancets disp - 100/11 refills 100 each 6  . HYDROcodone-acetaminophen (NORCO/VICODIN) 5-325 MG per tablet Take 1 tablet by mouth every 6 (six) hours as needed for severe pain (Uses only for severe back pain). 30 tablet 0  . triamcinolone cream (KENALOG) 0.1 % Apply 1 application topically 2 (two) times daily. 30 g 0   Current Facility-Administered Medications on File Prior to Visit  Medication Dose Route Frequency Provider Last Rate Last Dose  . 0.9 %  sodium chloride infusion  500 mL Intravenous Continuous Jerene Bears, MD       Allergies  Allergen Reactions  . Contrast Media [Iodinated Diagnostic Agents] Nausea And Vomiting  . Penicillins Hives and Itching   History   Social History  . Marital Status: Widowed    Spouse Name: N/A    Number of Children: 4  . Years of Education: N/A   Occupational History  . Unemployed    Social History Main Topics  . Smoking status: Current Every Day Smoker -- 1.00 packs/day for 40 years    Types: Cigarettes  . Smokeless tobacco: Never Used  . Alcohol Use: No  . Drug Use: No  . Sexual Activity: Not on file   Other Topics Concern  . Not on file   Social History Narrative     Review of Systems  All other systems reviewed and are negative.      Objective:   Physical Exam  Constitutional: She appears well-developed and well-nourished. No distress.  Eyes: Conjunctivae and EOM are normal. Pupils are equal, round, and reactive to light.  Neck:  Neck supple. No JVD present. No thyromegaly present.  Cardiovascular: Normal rate, regular rhythm and normal heart sounds.   No murmur heard. Pulmonary/Chest: Effort normal and breath sounds normal. No respiratory distress. She has no wheezes. She has no rales.  Abdominal: Soft. Bowel sounds are normal. She exhibits no distension. There is no tenderness. There is no rebound and no guarding.  Musculoskeletal: She exhibits  no edema.  Lymphadenopathy:    She has no cervical adenopathy.  Skin: She is not diaphoretic.  Vitals reviewed.         Assessment & Plan:  Situational anxiety - Plan: ALPRAZolam (XANAX) 0.5 MG tablet  Diabetes mellitus type II, controlled - Plan: Hemoglobin B8O  Diabetic complication - Plan: pioglitazone (ACTOS) 30 MG tablet  Diabetes mellitus without complication - Plan: metFORMIN (GLUCOPHAGE) 1000 MG tablet  HLD (hyperlipidemia)  Agents blood pressure is excellent. Her cholesterol is acceptable. I will check a hemoglobin A1c. If her hemoglobin A1c is less than 6, heart try to wean the patient away from medication. I do not feel that the patient needs to check her blood sugars every day now. This makes the patient very happy because she hates to prick her finger on a daily basis. For the situational anxiety PRESCRIBE the patient Xanax 0.5 mg every 8 hours as needed for panic attacks. I gave the patient 30 pills and advised her to use this medication sparingly to avoid the risk of dependency.

## 2014-01-01 ENCOUNTER — Ambulatory Visit (INDEPENDENT_AMBULATORY_CARE_PROVIDER_SITE_OTHER): Payer: Medicare HMO | Admitting: Family Medicine

## 2014-01-01 ENCOUNTER — Encounter: Payer: Self-pay | Admitting: Family Medicine

## 2014-01-01 VITALS — BP 118/80 | HR 90 | Temp 98.1°F | Resp 18 | Ht 62.0 in | Wt 159.0 lb

## 2014-01-01 DIAGNOSIS — J019 Acute sinusitis, unspecified: Secondary | ICD-10-CM

## 2014-01-01 DIAGNOSIS — B354 Tinea corporis: Secondary | ICD-10-CM

## 2014-01-01 MED ORDER — CLOTRIMAZOLE 1 % EX CREA: 1.0000 "application " | TOPICAL_CREAM | Freq: Two times a day (BID) | CUTANEOUS | Status: DC

## 2014-01-01 MED ORDER — FLUTICASONE PROPIONATE 50 MCG/ACT NA SUSP: 2.0000 | Freq: Every day | NASAL | Status: DC

## 2014-01-01 MED ORDER — AZITHROMYCIN 250 MG PO TABS: ORAL_TABLET | ORAL | Status: DC

## 2014-01-01 NOTE — Progress Notes (Signed)
Subjective:    Patient ID: Rhonda Fields, female    DOB: May 21, 1950, 63 y.o.   MRN: [id number]  HPI  Patient has had severe pain and pressure in her left maxillary and left frontal sinus now for over 2 weeks. She has copious rhinorrhea at the beginning but now is no longer draining. She does have occasionally postnasal drip. She has a dull constant headache located in her frontal and maxillary sinuses left greater than right.  She denies any purulent nasal discharge. She denies any true fevers. She also has a rash on her left breast that has been there for 9 days. It is 2 cm x 1 cm. It is a red spreading ring with a central clearing covered in white scale. It appears to be tinea. Past Medical History  Diagnosis Date  . Anxiety   . Arrhythmia   . Chronic headaches   . Depression   . IBS (irritable bowel syndrome) 1986  . GERD (gastroesophageal reflux disease)   . Hard of hearing   . Diabetes mellitus without complication    Past Surgical History  Procedure Laterality Date  . Cholecystectomy    . Abdominal hysterectomy    . Ercp  04/20/2011    Procedure: ENDOSCOPIC RETROGRADE CHOLANGIOPANCREATOGRAPHY (ERCP);  Surgeon: Inda Castle, MD;  Location: [facility];  Service: Endoscopy;  Laterality: N/A;  case is at 1430 in or  . Ercp  04/20/2011    Procedure: ENDOSCOPIC RETROGRADE CHOLANGIOPANCREATOGRAPHY (ERCP);  Surgeon: Inda Castle, MD;  Location: [facility];  Service: Gastroenterology;  Laterality: N/A;   Current Outpatient Prescriptions on File Prior to Visit  Medication Sig Dispense Refill  . ACCU-CHEK SOFTCLIX LANCETS lancets Check bs bid dx-250.00 200 each 3  . ALPRAZolam (XANAX) 0.5 MG tablet Take 1 tablet (0.5 mg total) by mouth 3 (three) times daily as needed for anxiety. 30 tablet 0  . Blood Glucose Monitoring Suppl (ACCU-CHEK AVIVA PLUS) W/DEVICE KIT Check BS bid DX 250.00 1 kit 0  . glucose blood (ACCU-CHEK AVIVA PLUS) test strip Check BS bid  Dx 250.00 - Also needs  lancets disp - 100/11 refills 100 each 6  . HYDROcodone-acetaminophen (NORCO/VICODIN) 5-325 MG per tablet Take 1 tablet by mouth every 6 (six) hours as needed for severe pain (Uses only for severe back pain). 30 tablet 0  . metFORMIN (GLUCOPHAGE) 1000 MG tablet Take 1 tablet (1,000 mg total) by mouth 2 (two) times daily with a meal. 180 tablet 3  . pantoprazole (PROTONIX) 40 MG tablet Take 1 tablet (40 mg total) by mouth 2 (two) times daily. 180 tablet 3  . pioglitazone (ACTOS) 30 MG tablet Take 1 tablet (30 mg total) by mouth daily. 90 tablet 3  . pravastatin (PRAVACHOL) 20 MG tablet Take 1 tablet (20 mg total) by mouth daily. 90 tablet 3  . triamcinolone cream (KENALOG) 0.1 % Apply 1 application topically 2 (two) times daily. 30 g 0   Current Facility-Administered Medications on File Prior to Visit  Medication Dose Route Frequency Provider Last Rate Last Dose  . 0.9 %  sodium chloride infusion  500 mL Intravenous Continuous Jerene Bears, MD       Allergies  Allergen Reactions  . Contrast Media [Iodinated Diagnostic Agents] Nausea And Vomiting  . Penicillins Hives and Itching   History   Social History  . Marital Status: Widowed    Spouse Name: N/A    Number of Children: 4  . Years of Education: N/A   Occupational  History  . Unemployed    Social History Main Topics  . Smoking status: Current Every Day Smoker -- 1.00 packs/day for 40 years    Types: Cigarettes  . Smokeless tobacco: Never Used  . Alcohol Use: No  . Drug Use: No  . Sexual Activity: Not on file   Other Topics Concern  . Not on file   Social History Narrative     Review of Systems  All other systems reviewed and are negative.      Objective:   Physical Exam  Constitutional: She appears well-developed and well-nourished.  HENT:  Right Ear: Tympanic membrane, external ear and ear canal normal.  Left Ear: Tympanic membrane, external ear and ear canal normal.  Nose: Mucosal edema and rhinorrhea present.  Left sinus exhibits maxillary sinus tenderness and frontal sinus tenderness.  Mouth/Throat: Oropharynx is clear and moist. No oropharyngeal exudate.  Eyes: Conjunctivae are normal.  Neck: Neck supple.  Cardiovascular: Normal rate, regular rhythm and normal heart sounds.   Pulmonary/Chest: Effort normal and breath sounds normal. No respiratory distress. She has no wheezes. She has no rales.  Lymphadenopathy:    She has no cervical adenopathy.  Skin: Rash noted. There is erythema.  Vitals reviewed.         Assessment & Plan:  Acute rhinosinusitis - Plan: azithromycin (ZITHROMAX) 250 MG tablet, fluticasone (FLONASE) 50 MCG/ACT nasal spray  Tinea corporis  Patient appears to have a sinus infection. Begin Flonase 2 sprays each nostril daily. Also start Zithromax 500 mg day one, 250 mg day 2 through 5. Recheck in 10 days if no better. I will treat the rash on her left breast as a fungal infection with Lotrimin cream twice daily for 2 weeks. Recheck in 2 weeks if no better.  KOH is positive.

## 2014-01-08 ENCOUNTER — Telehealth: Payer: Self-pay | Admitting: Family Medicine

## 2014-01-08 NOTE — Telephone Encounter (Signed)
Patient is calling to give an update on the spot on her breast  [phone]

## 2014-01-10 NOTE — Telephone Encounter (Signed)
LMTRC

## 2014-01-17 NOTE — Telephone Encounter (Signed)
Pt states that spot on breast is not much better - not as red but still has the bumps around it - please advise

## 2014-01-18 NOTE — Telephone Encounter (Signed)
Pt aware and appt made.

## 2014-01-18 NOTE — Telephone Encounter (Signed)
I'd like to recheck the rash for possible biopsy.

## 2014-01-26 ENCOUNTER — Encounter: Payer: Self-pay | Admitting: Family Medicine

## 2014-01-26 ENCOUNTER — Encounter: Payer: Medicare HMO | Admitting: Family Medicine

## 2014-01-26 VITALS — BP 126/92 | HR 94 | Temp 98.4°F | Resp 18 | Ht 63.0 in | Wt 163.0 lb

## 2014-01-26 DIAGNOSIS — B354 Tinea corporis: Secondary | ICD-10-CM

## 2014-01-26 MED ORDER — CLOTRIMAZOLE 1 % EX CREA: 1.0000 "application " | TOPICAL_CREAM | Freq: Two times a day (BID) | CUTANEOUS | Status: DC

## 2014-01-26 NOTE — Progress Notes (Signed)
This encounter was created in error - please disregard.

## 2014-02-26 ENCOUNTER — Ambulatory Visit: Payer: Medicare HMO | Admitting: Family Medicine

## 2014-03-09 ENCOUNTER — Encounter: Payer: Self-pay | Admitting: Family Medicine

## 2014-03-09 ENCOUNTER — Ambulatory Visit (INDEPENDENT_AMBULATORY_CARE_PROVIDER_SITE_OTHER): Payer: Medicare HMO | Admitting: Family Medicine

## 2014-03-09 VITALS — BP 120/62 | HR 78 | Temp 98.1°F | Resp 18 | Wt 159.0 lb

## 2014-03-09 DIAGNOSIS — E785 Hyperlipidemia, unspecified: Secondary | ICD-10-CM

## 2014-03-09 DIAGNOSIS — E119 Type 2 diabetes mellitus without complications: Secondary | ICD-10-CM

## 2014-03-09 LAB — COMPLETE METABOLIC PANEL WITH GFR
ALBUMIN: 4.3 g/dL (ref 3.5–5.2)
ALT: 17 U/L (ref 0–35)
AST: 18 U/L (ref 0–37)
Alkaline Phosphatase: 75 U/L (ref 39–117)
BUN: 11 mg/dL (ref 6–23)
CALCIUM: 10 mg/dL (ref 8.4–10.5)
CHLORIDE: 103 meq/L (ref 96–112)
CO2: 25 mEq/L (ref 19–32)
Creat: 0.75 mg/dL (ref 0.50–1.10)
GFR, Est African American: >89 mL/min
GFR, Est Non African American: 86 mL/min
Glucose, Bld: 116 mg/dL — ABNORMAL HIGH (ref 70–99)
POTASSIUM: 4.6 meq/L (ref 3.5–5.3)
SODIUM: 141 meq/L (ref 135–145)
Total Bilirubin: 0.4 mg/dL (ref 0.2–1.2)
Total Protein: 7.3 g/dL (ref 6.0–8.3)

## 2014-03-09 LAB — CBC WITH DIFFERENTIAL/PLATELET
Basophils Absolute: 0 10*3/uL (ref 0.0–0.1)
Basophils Relative: 0 % (ref 0–1)
Eosinophils Absolute: 0.3 10*3/uL (ref 0.0–0.7)
Eosinophils Relative: 3 % (ref 0–5)
HEMATOCRIT: 40.7 % (ref 36.0–46.0)
Hemoglobin: 13.7 g/dL (ref 12.0–15.0)
LYMPHS ABS: 2.4 10*3/uL (ref 0.7–4.0)
Lymphocytes Relative: 26 % (ref 12–46)
MCH: 29.9 pg (ref 26.0–34.0)
MCHC: 33.7 g/dL (ref 30.0–36.0)
MCV: 88.9 fL (ref 78.0–100.0)
MONO ABS: 0.5 10*3/uL (ref 0.1–1.0)
MPV: 8.6 fL (ref 8.6–12.4)
Monocytes Relative: 5 % (ref 3–12)
NEUTROS ABS: 6.1 10*3/uL (ref 1.7–7.7)
NEUTROS PCT: 66 % (ref 43–77)
Platelets: 324 10*3/uL (ref 150–400)
RBC: 4.58 MIL/uL (ref 3.87–5.11)
RDW: 13.8 % (ref 11.5–15.5)
WBC: 9.3 10*3/uL (ref 4.0–10.5)

## 2014-03-09 LAB — LIPID PANEL
Cholesterol: 172 mg/dL (ref 0–200)
HDL: 50 mg/dL (ref >39)
LDL Cholesterol: 80 mg/dL (ref 0–99)
TRIGLYCERIDES: 209 mg/dL — AB (ref <150)
Total CHOL/HDL Ratio: 3.4 Ratio
VLDL: 42 mg/dL — ABNORMAL HIGH (ref 0–40)

## 2014-03-09 NOTE — Progress Notes (Signed)
Subjective:    Patient ID: Rhonda Fields, female    DOB: 1950-06-17, 64 y.o.   MRN: [id number]  HPI Patient is here today for follow-up of her diabetes mellitus type 2. Her blood sugars typically range between 101 120. She denies any polyuria, polydipsia, or blurred vision. She denies any hypoglycemic episodes. Her weight is stable. She is going to try to begin exercising more now that the weather starting to warm. She denies any chest pain shortness of breath or dyspnea on exertion. Her blood pressure today is well controlled at 120/62. She is due for a flu shot but she refuses this today. She is also due for Pneumovax 23 but she would like to defer this at the present time. Past Medical History  Diagnosis Date  . Anxiety   . Arrhythmia   . Chronic headaches   . Depression   . IBS (irritable bowel syndrome) 1986  . GERD (gastroesophageal reflux disease)   . Hard of hearing   . Diabetes mellitus without complication    Past Surgical History  Procedure Laterality Date  . Cholecystectomy    . Abdominal hysterectomy    . Ercp  04/20/2011    Procedure: ENDOSCOPIC RETROGRADE CHOLANGIOPANCREATOGRAPHY (ERCP);  Surgeon: Inda Castle, MD;  Location: [facility];  Service: Endoscopy;  Laterality: N/A;  case is at 1430 in or  . Ercp  04/20/2011    Procedure: ENDOSCOPIC RETROGRADE CHOLANGIOPANCREATOGRAPHY (ERCP);  Surgeon: Inda Castle, MD;  Location: [facility];  Service: Gastroenterology;  Laterality: N/A;   Current Outpatient Prescriptions on File Prior to Visit  Medication Sig Dispense Refill  . ACCU-CHEK SOFTCLIX LANCETS lancets Check bs bid dx-250.00 200 each 3  . ALPRAZolam (XANAX) 0.5 MG tablet Take 1 tablet (0.5 mg total) by mouth 3 (three) times daily as needed for anxiety. 30 tablet 0  . Blood Glucose Monitoring Suppl (ACCU-CHEK AVIVA PLUS) W/DEVICE KIT Check BS bid DX 250.00 1 kit 0  . fluticasone (FLONASE) 50 MCG/ACT nasal spray Place 2 sprays into both nostrils daily. 16 g  6  . glucose blood (ACCU-CHEK AVIVA PLUS) test strip Check BS bid  Dx 250.00 - Also needs lancets disp - 100/11 refills 100 each 6  . HYDROcodone-acetaminophen (NORCO/VICODIN) 5-325 MG per tablet Take 1 tablet by mouth every 6 (six) hours as needed for severe pain (Uses only for severe back pain). 30 tablet 0  . metFORMIN (GLUCOPHAGE) 1000 MG tablet Take 1 tablet (1,000 mg total) by mouth 2 (two) times daily with a meal. 180 tablet 3  . pantoprazole (PROTONIX) 40 MG tablet Take 1 tablet (40 mg total) by mouth 2 (two) times daily. 180 tablet 3  . pioglitazone (ACTOS) 30 MG tablet Take 1 tablet (30 mg total) by mouth daily. 90 tablet 3  . pravastatin (PRAVACHOL) 20 MG tablet Take 1 tablet (20 mg total) by mouth daily. 90 tablet 3  . triamcinolone cream (KENALOG) 0.1 % Apply 1 application topically 2 (two) times daily. 30 g 0   Current Facility-Administered Medications on File Prior to Visit  Medication Dose Route Frequency Provider Last Rate Last Dose  . 0.9 %  sodium chloride infusion  500 mL Intravenous Continuous Jerene Bears, MD       Allergies  Allergen Reactions  . Contrast Media [Iodinated Diagnostic Agents] Nausea And Vomiting  . Penicillins Hives and Itching   History   Social History  . Marital Status: Widowed    Spouse Name: N/A  . Number of  Children: 4  . Years of Education: N/A   Occupational History  . Unemployed    Social History Main Topics  . Smoking status: Current Every Day Smoker -- 1.00 packs/day for 40 years    Types: Cigarettes  . Smokeless tobacco: Never Used  . Alcohol Use: No  . Drug Use: No  . Sexual Activity: Not on file   Other Topics Concern  . Not on file   Social History Narrative      Review of Systems  All other systems reviewed and are negative.      Objective:   Physical Exam  Constitutional: She appears well-developed and well-nourished.  Cardiovascular: Normal rate, regular rhythm and normal heart sounds.   No murmur  heard. Pulmonary/Chest: Effort normal and breath sounds normal. No respiratory distress. She has no wheezes. She has no rales.  Abdominal: Soft. Bowel sounds are normal.  Musculoskeletal: She exhibits no edema.  Vitals reviewed.         Assessment & Plan:  Diabetes mellitus without complication - Plan: COMPLETE METABOLIC PANEL WITH GFR, CBC with Differential/Platelet, Lipid panel, Hemoglobin A1c, Microalbumin, urine  HLD (hyperlipidemia)  Patient's blood pressures well controlled. I will check a fasting lipid panel. Goal LDL cholesterol is less than 100. I will also check a hemoglobin A1c. Hemoglobin A1c is less than 6.5. I recommended Pneumovax 23 but the patient would like to defer that. Also recommended a diabetic eye exam as well as a baby aspirin 81 mg by mouth daily. I also recommended smoking cessation.

## 2014-03-10 LAB — HEMOGLOBIN A1C
Hgb A1c MFr Bld: 6.1 % — ABNORMAL HIGH (ref <5.7)
Mean Plasma Glucose: 128 mg/dL — ABNORMAL HIGH (ref <117)

## 2014-03-10 LAB — MICROALBUMIN, URINE: Microalb, Ur: 1 mg/dL (ref <2.0)

## 2014-03-12 ENCOUNTER — Encounter: Payer: Self-pay | Admitting: Family Medicine

## 2014-04-05 ENCOUNTER — Encounter: Payer: Self-pay | Admitting: Internal Medicine

## 2014-07-27 ENCOUNTER — Other Ambulatory Visit: Payer: Medicare HMO

## 2014-07-27 DIAGNOSIS — E785 Hyperlipidemia, unspecified: Secondary | ICD-10-CM

## 2014-07-27 DIAGNOSIS — E119 Type 2 diabetes mellitus without complications: Secondary | ICD-10-CM | POA: Diagnosis not present

## 2014-07-27 LAB — CBC WITH DIFFERENTIAL/PLATELET
Basophils Absolute: 0.1 10*3/uL (ref 0.0–0.1)
Basophils Relative: 1 % (ref 0–1)
Eosinophils Absolute: 0.3 10*3/uL (ref 0.0–0.7)
Eosinophils Relative: 3 % (ref 0–5)
HEMATOCRIT: 40.6 % (ref 36.0–46.0)
Hemoglobin: 13.4 g/dL (ref 12.0–15.0)
LYMPHS PCT: 24 % (ref 12–46)
Lymphs Abs: 2.1 10*3/uL (ref 0.7–4.0)
MCH: 29.1 pg (ref 26.0–34.0)
MCHC: 33 g/dL (ref 30.0–36.0)
MCV: 88.1 fL (ref 78.0–100.0)
MONOS PCT: 5 % (ref 3–12)
MPV: 8.5 fL — ABNORMAL LOW (ref 8.6–12.4)
Monocytes Absolute: 0.4 10*3/uL (ref 0.1–1.0)
NEUTROS ABS: 5.8 10*3/uL (ref 1.7–7.7)
Neutrophils Relative %: 67 % (ref 43–77)
Platelets: 300 10*3/uL (ref 150–400)
RBC: 4.61 MIL/uL (ref 3.87–5.11)
RDW: 14.2 % (ref 11.5–15.5)
WBC: 8.7 10*3/uL (ref 4.0–10.5)

## 2014-07-27 LAB — LIPID PANEL
Cholesterol: 192 mg/dL (ref 0–200)
HDL: 40 mg/dL — ABNORMAL LOW (ref >=46)
LDL CALC: 80 mg/dL (ref 0–99)
TRIGLYCERIDES: 362 mg/dL — AB (ref <150)
Total CHOL/HDL Ratio: 4.8 Ratio
VLDL: 72 mg/dL — AB (ref 0–40)

## 2014-07-27 LAB — COMPLETE METABOLIC PANEL WITH GFR
ALK PHOS: 72 U/L (ref 39–117)
ALT: 14 U/L (ref 0–35)
AST: 17 U/L (ref 0–37)
Albumin: 4 g/dL (ref 3.5–5.2)
BUN: 9 mg/dL (ref 6–23)
CALCIUM: 9.1 mg/dL (ref 8.4–10.5)
CO2: 25 meq/L (ref 19–32)
Chloride: 103 mEq/L (ref 96–112)
Creat: 0.71 mg/dL (ref 0.50–1.10)
Glucose, Bld: 106 mg/dL — ABNORMAL HIGH (ref 70–99)
POTASSIUM: 4.1 meq/L (ref 3.5–5.3)
SODIUM: 141 meq/L (ref 135–145)
TOTAL PROTEIN: 6.9 g/dL (ref 6.0–8.3)
Total Bilirubin: 0.4 mg/dL (ref 0.2–1.2)

## 2014-07-27 LAB — HEMOGLOBIN A1C
Hgb A1c MFr Bld: 6.3 % — ABNORMAL HIGH (ref <5.7)
MEAN PLASMA GLUCOSE: 134 mg/dL — AB (ref <117)

## 2014-07-30 ENCOUNTER — Ambulatory Visit (INDEPENDENT_AMBULATORY_CARE_PROVIDER_SITE_OTHER): Payer: Commercial Managed Care - HMO | Admitting: Family Medicine

## 2014-07-30 ENCOUNTER — Encounter: Payer: Self-pay | Admitting: Family Medicine

## 2014-07-30 VITALS — BP 126/80 | HR 98 | Temp 98.1°F | Resp 18 | Ht 63.0 in | Wt 158.0 lb

## 2014-07-30 DIAGNOSIS — F418 Other specified anxiety disorders: Secondary | ICD-10-CM

## 2014-07-30 DIAGNOSIS — E119 Type 2 diabetes mellitus without complications: Secondary | ICD-10-CM | POA: Diagnosis not present

## 2014-07-30 DIAGNOSIS — E785 Hyperlipidemia, unspecified: Secondary | ICD-10-CM

## 2014-07-30 DIAGNOSIS — Z23 Encounter for immunization: Secondary | ICD-10-CM | POA: Diagnosis not present

## 2014-07-30 MED ORDER — PRAVASTATIN SODIUM 20 MG PO TABS: 20.0000 mg | ORAL_TABLET | Freq: Every day | ORAL | Status: DC

## 2014-07-30 MED ORDER — HYDROCODONE-ACETAMINOPHEN 5-325 MG PO TABS: 1.0000 | ORAL_TABLET | Freq: Four times a day (QID) | ORAL | Status: DC | PRN

## 2014-07-30 MED ORDER — ALPRAZOLAM 0.5 MG PO TABS: 0.5000 mg | ORAL_TABLET | Freq: Three times a day (TID) | ORAL | Status: DC | PRN

## 2014-07-30 MED ORDER — METFORMIN HCL 1000 MG PO TABS: 1000.0000 mg | ORAL_TABLET | Freq: Two times a day (BID) | ORAL | Status: DC

## 2014-07-30 MED ORDER — PANTOPRAZOLE SODIUM 40 MG PO TBEC: 40.0000 mg | DELAYED_RELEASE_TABLET | Freq: Two times a day (BID) | ORAL | Status: DC

## 2014-07-30 NOTE — Progress Notes (Signed)
Subjective:    Patient ID: Rhonda Fields, female    DOB: 01/11/1951, 64 y.o.   MRN: [id number]  HPI  Patient is a very pleasant 64 year old female who is here today for regular follow-up of her diabetes. Her blood pressures well controlled at 126/80. She is scheduling an appointment to see an ophthalmologist for diabetic eye exam. She is taking an aspirin on a daily basis. She is overdue for Pneumovax 23. She denies any chest pain shortness of breath or dyspnea on exertion. She denies any myalgias or right upper quadrant pain. She denies any polyuria, polydipsia, or blurred vision. Her colonoscopy is up-to-date. She is overdue for mammogram but she refuses this and she also refuses a Pap smear. Appointment on 07/27/2014  Component Date Value Ref Range Status  . Sodium 07/27/2014 141  135 - 145 mEq/L Final  . Potassium 07/27/2014 4.1  3.5 - 5.3 mEq/L Final  . Chloride 07/27/2014 103  96 - 112 mEq/L Final  . CO2 07/27/2014 25  19 - 32 mEq/L Final  . Glucose, Bld 07/27/2014 106* 70 - 99 mg/dL Final  . BUN 07/27/2014 9  6 - 23 mg/dL Final  . Creat 07/27/2014 0.71  0.50 - 1.10 mg/dL Final  . Total Bilirubin 07/27/2014 0.4  0.2 - 1.2 mg/dL Final  . Alkaline Phosphatase 07/27/2014 72  39 - 117 U/L Final  . AST 07/27/2014 17  0 - 37 U/L Final  . ALT 07/27/2014 14  0 - 35 U/L Final  . Total Protein 07/27/2014 6.9  6.0 - 8.3 g/dL Final  . Albumin 07/27/2014 4.0  3.5 - 5.2 g/dL Final  . Calcium 07/27/2014 9.1  8.4 - 10.5 mg/dL Final  . GFR, Est African American 07/27/2014 >89   Final  . GFR, Est Non African American 07/27/2014 >89   Final   Comment:   The estimated GFR is a calculation valid for adults (>=89 years old) that uses the CKD-EPI algorithm to adjust for age and sex. It is   not to be used for children, pregnant women, hospitalized patients,    patients on dialysis, or with rapidly changing kidney function. According to the NKDEP, eGFR >89 is normal, 60-89 shows  mild impairment, 30-59 shows moderate impairment, 15-29 shows severe impairment and <15 is ESRD.     . WBC 07/27/2014 8.7  4.0 - 10.5 K/uL Final  . RBC 07/27/2014 4.61  3.87 - 5.11 MIL/uL Final  . Hemoglobin 07/27/2014 13.4  12.0 - 15.0 g/dL Final  . HCT 07/27/2014 40.6  36.0 - 46.0 % Final  . MCV 07/27/2014 88.1  78.0 - 100.0 fL Final  . MCH 07/27/2014 29.1  26.0 - 34.0 pg Final  . MCHC 07/27/2014 33.0  30.0 - 36.0 g/dL Final  . RDW 07/27/2014 14.2  11.5 - 15.5 % Final  . Platelets 07/27/2014 300  150 - 400 K/uL Final  . MPV 07/27/2014 8.5* 8.6 - 12.4 fL Final  . Neutrophils Relative % 07/27/2014 67  43 - 77 % Final  . Neutro Abs 07/27/2014 5.8  1.7 - 7.7 K/uL Final  . Lymphocytes Relative 07/27/2014 24  12 - 46 % Final  . Lymphs Abs 07/27/2014 2.1  0.7 - 4.0 K/uL Final  . Monocytes Relative 07/27/2014 5  3 - 12 % Final  . Monocytes Absolute 07/27/2014 0.4  0.1 - 1.0 K/uL Final  . Eosinophils Relative 07/27/2014 3  0 - 5 % Final  . Eosinophils Absolute 07/27/2014 0.3  0.0 -  0.7 K/uL Final  . Basophils Relative 07/27/2014 1  0 - 1 % Final  . Basophils Absolute 07/27/2014 0.1  0.0 - 0.1 K/uL Final  . Smear Review 07/27/2014 Criteria for review not met   Final  . Hgb A1c MFr Bld 07/27/2014 6.3* <5.7 % Final   Comment:                                                                        According to the ADA Clinical Practice Recommendations for 2011, when HbA1c is used as a screening test:     >=6.5%   Diagnostic of Diabetes Mellitus            (if abnormal result is confirmed)   5.7-6.4%   Increased risk of developing Diabetes Mellitus   References:Diagnosis and Classification of Diabetes Mellitus,Diabetes YFVC,9449,67(RFFMB 1):S62-S69 and Standards of Medical Care in         Diabetes - 2011,Diabetes WGYK,5993,57 (Suppl 1):S11-S61.     . Mean Plasma Glucose 07/27/2014 134* <117 mg/dL Final  . Cholesterol 07/27/2014 192  0 - 200 mg/dL Final   Comment: ATP III  Classification:       < 200        mg/dL        Desirable      200 - 239     mg/dL        Borderline High      >= 240        mg/dL        High     . Triglycerides 07/27/2014 362* <150 mg/dL Final  . HDL 07/27/2014 40* >=46 mg/dL Final  . Total CHOL/HDL Ratio 07/27/2014 4.8   Final  . VLDL 07/27/2014 72* 0 - 40 mg/dL Final  . LDL Cholesterol 07/27/2014 80  0 - 99 mg/dL Final   Comment:   Total Cholesterol/HDL Ratio:CHD Risk                        Coronary Heart Disease Risk Table                                        Men       Women          1/2 Average Risk              3.4        3.3              Average Risk              5.0        4.4           2X Average Risk              9.6        7.1           3X Average Risk             23.4       11.0 Use the calculated Patient Ratio above and the CHD Risk table  to determine the patient's  CHD Risk. ATP III Classification (LDL):       < 100        mg/dL         Optimal      100 - 129     mg/dL         Near or Above Optimal      130 - 159     mg/dL         Borderline High      160 - 189     mg/dL         High       > 190        mg/dL         Very High      Past Medical History  Diagnosis Date  . Anxiety   . Arrhythmia   . Chronic headaches   . Depression   . IBS (irritable bowel syndrome) 1986  . GERD (gastroesophageal reflux disease)   . Hard of hearing   . Diabetes mellitus without complication    Past Surgical History  Procedure Laterality Date  . Cholecystectomy    . Abdominal hysterectomy    . Ercp  04/20/2011    Procedure: ENDOSCOPIC RETROGRADE CHOLANGIOPANCREATOGRAPHY (ERCP);  Surgeon: Inda Castle, MD;  Location: [facility];  Service: Endoscopy;  Laterality: N/A;  case is at 1430 in or  . Ercp  04/20/2011    Procedure: ENDOSCOPIC RETROGRADE CHOLANGIOPANCREATOGRAPHY (ERCP);  Surgeon: Inda Castle, MD;  Location: [facility];  Service: Gastroenterology;  Laterality: N/A;   Current Outpatient Prescriptions on File Prior  to Visit  Medication Sig Dispense Refill  . ACCU-CHEK SOFTCLIX LANCETS lancets Check bs bid dx-250.00 200 each 3  . Blood Glucose Monitoring Suppl (ACCU-CHEK AVIVA PLUS) W/DEVICE KIT Check BS bid DX 250.00 1 kit 0  . fluticasone (FLONASE) 50 MCG/ACT nasal spray Place 2 sprays into both nostrils daily. 16 g 6  . glucose blood (ACCU-CHEK AVIVA PLUS) test strip Check BS bid  Dx 250.00 - Also needs lancets disp - 100/11 refills 100 each 6  . pioglitazone (ACTOS) 30 MG tablet Take 1 tablet (30 mg total) by mouth daily. 90 tablet 3  . triamcinolone cream (KENALOG) 0.1 % Apply 1 application topically 2 (two) times daily. 30 g 0   Current Facility-Administered Medications on File Prior to Visit  Medication Dose Route Frequency Provider Last Rate Last Dose  . 0.9 %  sodium chloride infusion  500 mL Intravenous Continuous Jerene Bears, MD       Allergies  Allergen Reactions  . Contrast Media [Iodinated Diagnostic Agents] Nausea And Vomiting  . Penicillins Hives and Itching   History   Social History  . Marital Status: Widowed    Spouse Name: N/A  . Number of Children: 4  . Years of Education: N/A   Occupational History  . Unemployed    Social History Main Topics  . Smoking status: Current Every Day Smoker -- 1.00 packs/day for 40 years    Types: Cigarettes  . Smokeless tobacco: Never Used  . Alcohol Use: No  . Drug Use: No  . Sexual Activity: Not on file   Other Topics Concern  . Not on file   Social History Narrative     Review of Systems  All other systems reviewed and are negative.      Objective:   Physical Exam  Cardiovascular: Normal rate, regular rhythm, normal heart sounds and intact distal pulses.  Pulmonary/Chest: Effort normal and breath sounds normal. No respiratory distress. She has no wheezes. She has no rales. She exhibits no tenderness.  Abdominal: Soft. Bowel sounds are normal. She exhibits no distension. There is no tenderness. There is no rebound and no  guarding.  Musculoskeletal: She exhibits no edema.  Vitals reviewed.         Assessment & Plan:  Situational anxiety - Plan: ALPRAZolam (XANAX) 0.5 MG tablet  Diabetes mellitus without complication - Plan: metFORMIN (GLUCOPHAGE) 1000 MG tablet  Diabetes mellitus type II, controlled - Plan: Microalbumin, urine  HLD (hyperlipidemia)  Due to weight gain, the patient would like to try stopping Actos and managing the diabetes using diet and exercise. We will recheck a hemoglobin A1c in 3 months. She will schedule her diabetic eye exam. She is taking aspirin. Diabetic foot exam is up-to-date today. I will give the patient Pneumovax 23. I have asked her to return for urine microalbumin at her convenience. I recommended a mammogram and a Pap smear but she declined both. Also recommended smoking cessation

## 2014-07-30 NOTE — Addendum Note (Signed)
Addended by: Shary Decamp B on: 07/30/2014 09:48 AM   Modules accepted: Orders

## 2014-07-31 ENCOUNTER — Encounter: Payer: Self-pay | Admitting: Family Medicine

## 2014-07-31 LAB — MICROALBUMIN, URINE: MICROALB UR: 0.2 mg/dL (ref <2.0)

## 2014-10-09 ENCOUNTER — Encounter: Payer: Self-pay | Admitting: Internal Medicine

## 2015-02-11 ENCOUNTER — Telehealth: Payer: Self-pay | Admitting: Family Medicine

## 2015-02-13 ENCOUNTER — Other Ambulatory Visit: Payer: Commercial Managed Care - HMO

## 2015-02-13 DIAGNOSIS — E785 Hyperlipidemia, unspecified: Secondary | ICD-10-CM

## 2015-02-13 DIAGNOSIS — E119 Type 2 diabetes mellitus without complications: Secondary | ICD-10-CM | POA: Diagnosis not present

## 2015-02-13 LAB — CBC WITH DIFFERENTIAL/PLATELET
Basophils Absolute: 0.1 10*3/uL (ref 0.0–0.1)
Basophils Relative: 1 % (ref 0–1)
EOS PCT: 3 % (ref 0–5)
Eosinophils Absolute: 0.3 10*3/uL (ref 0.0–0.7)
HCT: 42.1 % (ref 36.0–46.0)
Hemoglobin: 13.8 g/dL (ref 12.0–15.0)
LYMPHS PCT: 25 % (ref 12–46)
Lymphs Abs: 2.2 10*3/uL (ref 0.7–4.0)
MCH: 28.6 pg (ref 26.0–34.0)
MCHC: 32.8 g/dL (ref 30.0–36.0)
MCV: 87.2 fL (ref 78.0–100.0)
MONO ABS: 0.4 10*3/uL (ref 0.1–1.0)
MPV: 8.7 fL (ref 8.6–12.4)
Monocytes Relative: 4 % (ref 3–12)
Neutro Abs: 5.9 10*3/uL (ref 1.7–7.7)
Neutrophils Relative %: 67 % (ref 43–77)
Platelets: 289 10*3/uL (ref 150–400)
RBC: 4.83 MIL/uL (ref 3.87–5.11)
RDW: 15.2 % (ref 11.5–15.5)
WBC: 8.8 10*3/uL (ref 4.0–10.5)

## 2015-02-13 LAB — COMPLETE METABOLIC PANEL WITH GFR
ALBUMIN: 4 g/dL (ref 3.6–5.1)
ALK PHOS: 92 U/L (ref 33–130)
ALT: 27 U/L (ref 6–29)
AST: 27 U/L (ref 10–35)
BUN: 11 mg/dL (ref 7–25)
CALCIUM: 9.3 mg/dL (ref 8.6–10.4)
CO2: 25 mmol/L (ref 20–31)
CREATININE: 0.61 mg/dL (ref 0.50–0.99)
Chloride: 103 mmol/L (ref 98–110)
GFR, Est African American: >89 mL/min (ref >=60)
GFR, Est Non African American: >89 mL/min (ref >=60)
GLUCOSE: 105 mg/dL — AB (ref 70–99)
POTASSIUM: 4.1 mmol/L (ref 3.5–5.3)
SODIUM: 139 mmol/L (ref 135–146)
Total Bilirubin: 0.5 mg/dL (ref 0.2–1.2)
Total Protein: 7.2 g/dL (ref 6.1–8.1)

## 2015-02-13 LAB — LIPID PANEL
CHOL/HDL RATIO: 4.6 ratio (ref <=5.0)
Cholesterol: 161 mg/dL (ref 125–200)
HDL: 35 mg/dL — AB (ref >=46)
LDL CALC: 79 mg/dL (ref <130)
Triglycerides: 234 mg/dL — ABNORMAL HIGH (ref <150)
VLDL: 47 mg/dL — AB (ref <30)

## 2015-02-14 ENCOUNTER — Ambulatory Visit (INDEPENDENT_AMBULATORY_CARE_PROVIDER_SITE_OTHER): Payer: Commercial Managed Care - HMO | Admitting: Family Medicine

## 2015-02-14 ENCOUNTER — Encounter: Payer: Self-pay | Admitting: Family Medicine

## 2015-02-14 VITALS — BP 140/90 | HR 74 | Temp 98.3°F | Resp 18 | Ht 63.0 in | Wt 155.0 lb

## 2015-02-14 DIAGNOSIS — E785 Hyperlipidemia, unspecified: Secondary | ICD-10-CM | POA: Diagnosis not present

## 2015-02-14 DIAGNOSIS — E119 Type 2 diabetes mellitus without complications: Secondary | ICD-10-CM

## 2015-02-14 DIAGNOSIS — M722 Plantar fascial fibromatosis: Secondary | ICD-10-CM | POA: Diagnosis not present

## 2015-02-14 LAB — HEMOGLOBIN A1C
Hgb A1c MFr Bld: 6.4 % — ABNORMAL HIGH (ref <5.7)
Mean Plasma Glucose: 137 mg/dL — ABNORMAL HIGH (ref <117)

## 2015-02-14 MED ORDER — HYDROCODONE-ACETAMINOPHEN 5-325 MG PO TABS: 1.0000 | ORAL_TABLET | Freq: Four times a day (QID) | ORAL | Status: DC | PRN

## 2015-02-14 NOTE — Progress Notes (Signed)
Subjective:    Patient ID: Rhonda Fields, female    DOB: 1950/02/17, 65 y.o.   MRN: [id number]  HPI  07/30/14 Patient is a very pleasant 65 year old female who is here today for regular follow-up of her diabetes. Her blood pressures well controlled at 126/80. She is scheduling an appointment to see an ophthalmologist for diabetic eye exam. She is taking an aspirin on a daily basis. She is overdue for Pneumovax 23. She denies any chest pain shortness of breath or dyspnea on exertion. She denies any myalgias or right upper quadrant pain. She denies any polyuria, polydipsia, or blurred vision. Her colonoscopy is up-to-date. She is overdue for mammogram but she refuses this and she also refuses a Pap smear.  At that time, my plan was: Due to weight gain, the patient would like to try stopping Actos and managing the diabetes using diet and exercise. We will recheck a hemoglobin A1c in 3 months. She will schedule her diabetic eye exam. She is taking aspirin. Diabetic foot exam is up-to-date today. I will give the patient Pneumovax 23. I have asked her to return for urine microalbumin at her convenience. I recommended a mammogram and a Pap smear but she declined both. Also recommended smoking cessation  02/14/15 Here today for follow up.   Blood work looks Tour manager. She refuses a flu shot. She is overdue for diabetic eye exam. Diabetic foot exam was performed today and is normal. She denies any polyuria polydipsia or blurred vision. She does complain of pain in the left heel near the insertion of the plantar fascia over the medial calcaneus. It is been that way for several months. She is also asking for refill of her pain medication which she uses sparingly for headaches and more recently she's been taking it for the pain in her foot. Cholesterol is outstanding. She denies any myalgias or right upper quadrant pain. She denies any chest pain or shortness of breath or dyspnea on exertion Appointment  on 02/13/2015  Component Date Value Ref Range Status  . Sodium 02/13/2015 139  135 - 146 mmol/L Final  . Potassium 02/13/2015 4.1  3.5 - 5.3 mmol/L Final  . Chloride 02/13/2015 103  98 - 110 mmol/L Final  . CO2 02/13/2015 25  20 - 31 mmol/L Final  . Glucose, Bld 02/13/2015 105* 70 - 99 mg/dL Final  . BUN 02/13/2015 11  7 - 25 mg/dL Final  . Creat 02/13/2015 0.61  0.50 - 0.99 mg/dL Final  . Total Bilirubin 02/13/2015 0.5  0.2 - 1.2 mg/dL Final  . Alkaline Phosphatase 02/13/2015 92  33 - 130 U/L Final  . AST 02/13/2015 27  10 - 35 U/L Final  . ALT 02/13/2015 27  6 - 29 U/L Final  . Total Protein 02/13/2015 7.2  6.1 - 8.1 g/dL Final  . Albumin 02/13/2015 4.0  3.6 - 5.1 g/dL Final  . Calcium 02/13/2015 9.3  8.6 - 10.4 mg/dL Final  . GFR, Est African American 02/13/2015 >89  >=60 mL/min Final  . GFR, Est Non African American 02/13/2015 >89  >=60 mL/min Final   Comment:   The estimated GFR is a calculation valid for adults (>=51 years old) that uses the CKD-EPI algorithm to adjust for age and sex. It is   not to be used for children, pregnant women, hospitalized patients,    patients on dialysis, or with rapidly changing kidney function. According to the NKDEP, eGFR >89 is normal, 60-89 shows mild impairment, 30-59 shows  moderate impairment, 15-29 shows severe impairment and <15 is ESRD.     . WBC 02/13/2015 8.8  4.0 - 10.5 K/uL Final  . RBC 02/13/2015 4.83  3.87 - 5.11 MIL/uL Final  . Hemoglobin 02/13/2015 13.8  12.0 - 15.0 g/dL Final  . HCT 02/13/2015 42.1  36.0 - 46.0 % Final  . MCV 02/13/2015 87.2  78.0 - 100.0 fL Final  . MCH 02/13/2015 28.6  26.0 - 34.0 pg Final  . MCHC 02/13/2015 32.8  30.0 - 36.0 g/dL Final  . RDW 02/13/2015 15.2  11.5 - 15.5 % Final  . Platelets 02/13/2015 289  150 - 400 K/uL Final  . MPV 02/13/2015 8.7  8.6 - 12.4 fL Final  . Neutrophils Relative % 02/13/2015 67  43 - 77 % Final  . Neutro Abs 02/13/2015 5.9  1.7 - 7.7 K/uL Final  . Lymphocytes Relative  02/13/2015 25  12 - 46 % Final  . Lymphs Abs 02/13/2015 2.2  0.7 - 4.0 K/uL Final  . Monocytes Relative 02/13/2015 4  3 - 12 % Final  . Monocytes Absolute 02/13/2015 0.4  0.1 - 1.0 K/uL Final  . Eosinophils Relative 02/13/2015 3  0 - 5 % Final  . Eosinophils Absolute 02/13/2015 0.3  0.0 - 0.7 K/uL Final  . Basophils Relative 02/13/2015 1  0 - 1 % Final  . Basophils Absolute 02/13/2015 0.1  0.0 - 0.1 K/uL Final  . Smear Review 02/13/2015 Criteria for review not met   Final  . Cholesterol 02/13/2015 161  125 - 200 mg/dL Final  . Triglycerides 02/13/2015 234* <150 mg/dL Final  . HDL 02/13/2015 35* >=46 mg/dL Final  . Total CHOL/HDL Ratio 02/13/2015 4.6  <=5.0 Ratio Final  . VLDL 02/13/2015 47* <30 mg/dL Final  . LDL Cholesterol 02/13/2015 79  <130 mg/dL Final   Comment:   Total Cholesterol/HDL Ratio:CHD Risk                        Coronary Heart Disease Risk Table                                        Men       Women          1/2 Average Risk              3.4        3.3              Average Risk              5.0        4.4           2X Average Risk              9.6        7.1           3X Average Risk             23.4       11.0 Use the calculated Patient Ratio above and the CHD Risk table  to determine the patient's CHD Risk.    Past Medical History  Diagnosis Date  . Anxiety   . Arrhythmia   . Chronic headaches   . Depression   . IBS (irritable bowel syndrome) 1986  . GERD (gastroesophageal reflux disease)   . Hard of hearing   .  Diabetes mellitus without complication    Past Surgical History  Procedure Laterality Date  . Cholecystectomy    . Abdominal hysterectomy    . Ercp  04/20/2011    Procedure: ENDOSCOPIC RETROGRADE CHOLANGIOPANCREATOGRAPHY (ERCP);  Surgeon: Inda Castle, MD;  Location: [facility];  Service: Endoscopy;  Laterality: N/A;  case is at 1430 in or  . Ercp  04/20/2011    Procedure: ENDOSCOPIC RETROGRADE CHOLANGIOPANCREATOGRAPHY (ERCP);  Surgeon: Inda Castle, MD;  Location: [facility];  Service: Gastroenterology;  Laterality: N/A;   Current Outpatient Prescriptions on File Prior to Visit  Medication Sig Dispense Refill  . ACCU-CHEK SOFTCLIX LANCETS lancets Check bs bid dx-250.00 200 each 3  . ALPRAZolam (XANAX) 0.5 MG tablet Take 1 tablet (0.5 mg total) by mouth 3 (three) times daily as needed for anxiety. 30 tablet 0  . Blood Glucose Monitoring Suppl (ACCU-CHEK AVIVA PLUS) W/DEVICE KIT Check BS bid DX 250.00 1 kit 0  . cholecalciferol (VITAMIN D) 1000 UNITS tablet Take 1,000 Units by mouth daily.    . Cinnamon 500 MG TABS Take by mouth.    . fluticasone (FLONASE) 50 MCG/ACT nasal spray Place 2 sprays into both nostrils daily. 16 g 6  . glucose blood (ACCU-CHEK AVIVA PLUS) test strip Check BS bid  Dx 250.00 - Also needs lancets disp - 100/11 refills 100 each 6  . HYDROcodone-acetaminophen (NORCO/VICODIN) 5-325 MG per tablet Take 1 tablet by mouth every 6 (six) hours as needed for severe pain (Uses only for severe back pain). 30 tablet 0  . metFORMIN (GLUCOPHAGE) 1000 MG tablet Take 1 tablet (1,000 mg total) by mouth 2 (two) times daily with a meal. 180 tablet 3  . pantoprazole (PROTONIX) 40 MG tablet Take 1 tablet (40 mg total) by mouth 2 (two) times daily. 180 tablet 3  . pioglitazone (ACTOS) 30 MG tablet Take 1 tablet (30 mg total) by mouth daily. 90 tablet 3  . pravastatin (PRAVACHOL) 20 MG tablet Take 1 tablet (20 mg total) by mouth daily. 90 tablet 3  . triamcinolone cream (KENALOG) 0.1 % Apply 1 application topically 2 (two) times daily. 30 g 0  . vitamin E 100 UNIT capsule Take 100 Units by mouth daily.     Current Facility-Administered Medications on File Prior to Visit  Medication Dose Route Frequency Provider Last Rate Last Dose  . 0.9 %  sodium chloride infusion  500 mL Intravenous Continuous Jerene Bears, MD       Allergies  Allergen Reactions  . Contrast Media [Iodinated Diagnostic Agents] Nausea And Vomiting  . Penicillins  Hives and Itching   Social History   Social History  . Marital Status: Widowed    Spouse Name: N/A  . Number of Children: 4  . Years of Education: N/A   Occupational History  . Unemployed    Social History Main Topics  . Smoking status: Current Every Day Smoker -- 1.00 packs/day for 40 years    Types: Cigarettes  . Smokeless tobacco: Never Used  . Alcohol Use: No  . Drug Use: No  . Sexual Activity: Not on file   Other Topics Concern  . Not on file   Social History Narrative     Review of Systems  All other systems reviewed and are negative.      Objective:   Physical Exam  Cardiovascular: Normal rate, regular rhythm, normal heart sounds and intact distal pulses.   Pulmonary/Chest: Effort normal and breath sounds normal. No respiratory distress.  She has no wheezes. She has no rales. She exhibits no tenderness.  Abdominal: Soft. Bowel sounds are normal. She exhibits no distension. There is no tenderness. There is no rebound and no guarding.  Musculoskeletal: She exhibits no edema.  Vitals reviewed.         Assessment & Plan:  Diabetes mellitus without complication ([place]) - Plan: Ambulatory referral to Ophthalmology  HLD (hyperlipidemia)  Plantar fasciitis of left foot   exam today is normal. Blood pressure is acceptable. Cholesterol is acceptable. Diabetes test is excellent. Recommended a flu shot but she declined. I will schedule the patient for an ophthalmology exam. Patient has plantar fasciitis. I gave her some stretches to perform at home. If no better, return for cortisone injection.

## 2015-06-19 ENCOUNTER — Other Ambulatory Visit: Payer: Self-pay | Admitting: Family Medicine

## 2015-06-19 NOTE — Telephone Encounter (Signed)
Refill appropriate and filled per protocol. 

## 2015-09-30 DIAGNOSIS — E119 Type 2 diabetes mellitus without complications: Secondary | ICD-10-CM | POA: Diagnosis not present

## 2015-09-30 DIAGNOSIS — H52229 Regular astigmatism, unspecified eye: Secondary | ICD-10-CM | POA: Diagnosis not present

## 2015-09-30 DIAGNOSIS — H5210 Myopia, unspecified eye: Secondary | ICD-10-CM | POA: Diagnosis not present

## 2015-10-04 ENCOUNTER — Other Ambulatory Visit: Payer: Commercial Managed Care - HMO

## 2015-10-04 DIAGNOSIS — E119 Type 2 diabetes mellitus without complications: Secondary | ICD-10-CM | POA: Diagnosis not present

## 2015-10-04 DIAGNOSIS — E785 Hyperlipidemia, unspecified: Secondary | ICD-10-CM | POA: Diagnosis not present

## 2015-10-04 LAB — COMPLETE METABOLIC PANEL WITH GFR
ALBUMIN: 4 g/dL (ref 3.6–5.1)
ALK PHOS: 65 U/L (ref 33–130)
ALT: 50 U/L — ABNORMAL HIGH (ref 6–29)
AST: 53 U/L — ABNORMAL HIGH (ref 10–35)
BILIRUBIN TOTAL: 0.5 mg/dL (ref 0.2–1.2)
BUN: 8 mg/dL (ref 7–25)
CALCIUM: 9.3 mg/dL (ref 8.6–10.4)
CO2: 25 mmol/L (ref 20–31)
Chloride: 103 mmol/L (ref 98–110)
Creat: 0.82 mg/dL (ref 0.50–0.99)
GFR, EST AFRICAN AMERICAN: 87 mL/min (ref >=60)
GFR, EST NON AFRICAN AMERICAN: 76 mL/min (ref >=60)
GLUCOSE: 108 mg/dL — AB (ref 70–99)
POTASSIUM: 4.4 mmol/L (ref 3.5–5.3)
SODIUM: 140 mmol/L (ref 135–146)
TOTAL PROTEIN: 6.9 g/dL (ref 6.1–8.1)

## 2015-10-04 LAB — CBC WITH DIFFERENTIAL/PLATELET
BASOS ABS: 81 {cells}/uL (ref 0–200)
Basophils Relative: 1 %
EOS PCT: 3 %
Eosinophils Absolute: 243 cells/uL (ref 15–500)
HCT: 43.5 % (ref 35.0–45.0)
HEMOGLOBIN: 14.5 g/dL (ref 12.0–15.0)
LYMPHS ABS: 2025 {cells}/uL (ref 850–3900)
Lymphocytes Relative: 25 %
MCH: 29.6 pg (ref 27.0–33.0)
MCHC: 33.3 g/dL (ref 32.0–36.0)
MCV: 88.8 fL (ref 80.0–100.0)
MONOS PCT: 5 %
MPV: 9 fL (ref 7.5–12.5)
Monocytes Absolute: 405 cells/uL (ref 200–950)
NEUTROS ABS: 5346 {cells}/uL (ref 1500–7800)
Neutrophils Relative %: 66 %
PLATELETS: 295 10*3/uL (ref 140–400)
RBC: 4.9 MIL/uL (ref 3.80–5.10)
RDW: 14.1 % (ref 11.0–15.0)
WBC: 8.1 10*3/uL (ref 3.8–10.8)

## 2015-10-04 LAB — LIPID PANEL
Cholesterol: 173 mg/dL (ref 125–200)
HDL: 40 mg/dL — ABNORMAL LOW (ref >=46)
LDL CALC: 68 mg/dL (ref <130)
TRIGLYCERIDES: 325 mg/dL — AB (ref <150)
Total CHOL/HDL Ratio: 4.3 Ratio (ref <=5.0)
VLDL: 65 mg/dL — ABNORMAL HIGH (ref <30)

## 2015-10-05 LAB — HEMOGLOBIN A1C
HEMOGLOBIN A1C: 6.2 % — AB (ref <5.7)
MEAN PLASMA GLUCOSE: 131 mg/dL

## 2015-10-08 ENCOUNTER — Ambulatory Visit (INDEPENDENT_AMBULATORY_CARE_PROVIDER_SITE_OTHER): Payer: Commercial Managed Care - HMO | Admitting: Family Medicine

## 2015-10-08 ENCOUNTER — Encounter: Payer: Self-pay | Admitting: Family Medicine

## 2015-10-08 VITALS — BP 146/92 | HR 80 | Temp 98.4°F | Resp 18 | Ht 63.0 in | Wt 157.0 lb

## 2015-10-08 DIAGNOSIS — R7989 Other specified abnormal findings of blood chemistry: Secondary | ICD-10-CM

## 2015-10-08 DIAGNOSIS — R945 Abnormal results of liver function studies: Secondary | ICD-10-CM

## 2015-10-08 DIAGNOSIS — E119 Type 2 diabetes mellitus without complications: Secondary | ICD-10-CM

## 2015-10-08 DIAGNOSIS — K760 Fatty (change of) liver, not elsewhere classified: Secondary | ICD-10-CM

## 2015-10-08 DIAGNOSIS — E785 Hyperlipidemia, unspecified: Secondary | ICD-10-CM

## 2015-10-08 DIAGNOSIS — I10 Essential (primary) hypertension: Secondary | ICD-10-CM | POA: Diagnosis not present

## 2015-10-08 MED ORDER — HYDROCODONE-ACETAMINOPHEN 5-325 MG PO TABS: 1.0000 | ORAL_TABLET | Freq: Four times a day (QID) | ORAL | 0 refills | Status: DC | PRN

## 2015-10-08 MED ORDER — LOSARTAN POTASSIUM 25 MG PO TABS: 25.0000 mg | ORAL_TABLET | Freq: Every day | ORAL | 3 refills | Status: DC

## 2015-10-08 NOTE — Progress Notes (Signed)
Subjective:    Patient ID: Rhonda Fields, female    DOB: 09-30-1950, 65 y.o.   MRN: [id number]  HPI Patient is a very pleasant 65 year old female who is here today for regular follow-up of her diabetes. Her blood pressure is elevated at 146/92. Diabetic eye exam was performed last week.  She is taking an aspirin on a daily basis. She refuses flu shot.  She denies any chest pain shortness of breath or dyspnea on exertion. She denies any myalgias or right upper quadrant pain. She denies any polyuria, polydipsia, or blurred vision. Her colonoscopy is up-to-date. Diabetic foot exam is up-to-date today.  Appointment on 10/04/2015  Component Date Value Ref Range Status  . Sodium 10/04/2015 140  135 - 146 mmol/L Final  . Potassium 10/04/2015 4.4  3.5 - 5.3 mmol/L Final  . Chloride 10/04/2015 103  98 - 110 mmol/L Final  . CO2 10/04/2015 25  20 - 31 mmol/L Final  . Glucose, Bld 10/04/2015 108* 70 - 99 mg/dL Final  . BUN 10/04/2015 8  7 - 25 mg/dL Final  . Creat 10/04/2015 0.82  0.50 - 0.99 mg/dL Final   Comment:   For patients > or = 64 years of age: The upper reference limit for Creatinine is approximately 13% higher for people identified as African-American.     . Total Bilirubin 10/04/2015 0.5  0.2 - 1.2 mg/dL Final  . Alkaline Phosphatase 10/04/2015 65  33 - 130 U/L Final  . AST 10/04/2015 53* 10 - 35 U/L Final  . ALT 10/04/2015 50* 6 - 29 U/L Final  . Total Protein 10/04/2015 6.9  6.1 - 8.1 g/dL Final  . Albumin 10/04/2015 4.0  3.6 - 5.1 g/dL Final  . Calcium 10/04/2015 9.3  8.6 - 10.4 mg/dL Final  . GFR, Est African American 10/04/2015 87  >=60 mL/min Final  . GFR, Est Non African American 10/04/2015 76  >=60 mL/min Final  . WBC 10/04/2015 8.1  3.8 - 10.8 K/uL Final  . RBC 10/04/2015 4.90  3.80 - 5.10 MIL/uL Final  . Hemoglobin 10/04/2015 14.5  12.0 - 15.0 g/dL Final  . HCT 10/04/2015 43.5  35.0 - 45.0 % Final  . MCV 10/04/2015 88.8  80.0 - 100.0 fL Final  . MCH  10/04/2015 29.6  27.0 - 33.0 pg Final  . MCHC 10/04/2015 33.3  32.0 - 36.0 g/dL Final  . RDW 10/04/2015 14.1  11.0 - 15.0 % Final  . Platelets 10/04/2015 295  140 - 400 K/uL Final  . MPV 10/04/2015 9.0  7.5 - 12.5 fL Final  . Neutro Abs 10/04/2015 5346  1,500 - 7,800 cells/uL Final  . Lymphs Abs 10/04/2015 2025  850 - 3,900 cells/uL Final  . Monocytes Absolute 10/04/2015 405  200 - 950 cells/uL Final  . Eosinophils Absolute 10/04/2015 243  15 - 500 cells/uL Final  . Basophils Absolute 10/04/2015 81  0 - 200 cells/uL Final  . Neutrophils Relative % 10/04/2015 66  % Final  . Lymphocytes Relative 10/04/2015 25  % Final  . Monocytes Relative 10/04/2015 5  % Final  . Eosinophils Relative 10/04/2015 3  % Final  . Basophils Relative 10/04/2015 1  % Final  . Smear Review 10/04/2015 Criteria for review not met   Final  . Hgb A1c MFr Bld 10/05/2015 6.2* <5.7 % Final   Comment:   For someone without known diabetes, a hemoglobin A1c value between 5.7% and 6.4% is consistent with prediabetes and should be confirmed with  a follow-up test.   For someone with known diabetes, a value <7% indicates that their diabetes is well controlled. A1c targets should be individualized based on duration of diabetes, age, co-morbid conditions and other considerations.   This assay result is consistent with an increased risk of diabetes.   Currently, no consensus exists regarding use of hemoglobin A1c for diagnosis of diabetes in children.     . Mean Plasma Glucose 10/05/2015 131  mg/dL Final  . Cholesterol 10/04/2015 173  125 - 200 mg/dL Final  . Triglycerides 10/04/2015 325* <150 mg/dL Final  . HDL 10/04/2015 40* >=46 mg/dL Final  . Total CHOL/HDL Ratio 10/04/2015 4.3  <=5.0 Ratio Final  . VLDL 10/04/2015 65* <30 mg/dL Final  . LDL Cholesterol 10/04/2015 68  <130 mg/dL Final   Comment:   Total Cholesterol/HDL Ratio:CHD Risk                        Coronary Heart Disease Risk Table                                         Men       Women          1/2 Average Risk              3.4        3.3              Average Risk              5.0        4.4           2X Average Risk              9.6        7.1           3X Average Risk             23.4       11.0 Use the calculated Patient Ratio above and the CHD Risk table  to determine the patient's CHD Risk.    Past Medical History:  Diagnosis Date  . Anxiety   . Arrhythmia   . Chronic headaches   . Depression   . Diabetes mellitus without complication ([place])   . GERD (gastroesophageal reflux disease)   . Hard of hearing   . IBS (irritable bowel syndrome) 1986   Past Surgical History:  Procedure Laterality Date  . ABDOMINAL HYSTERECTOMY    . CHOLECYSTECTOMY    . ERCP  04/20/2011   Procedure: ENDOSCOPIC RETROGRADE CHOLANGIOPANCREATOGRAPHY (ERCP);  Surgeon: Inda Castle, MD;  Location: [facility];  Service: Endoscopy;  Laterality: N/A;  case is at 1430 in or  . ERCP  04/20/2011   Procedure: ENDOSCOPIC RETROGRADE CHOLANGIOPANCREATOGRAPHY (ERCP);  Surgeon: Inda Castle, MD;  Location: [facility];  Service: Gastroenterology;  Laterality: N/A;   Current Outpatient Prescriptions on File Prior to Visit  Medication Sig Dispense Refill  . ACCU-CHEK SOFTCLIX LANCETS lancets Check bs bid dx-250.00 200 each 3  . ALPRAZolam (XANAX) 0.5 MG tablet Take 1 tablet (0.5 mg total) by mouth 3 (three) times daily as needed for anxiety. 30 tablet 0  . Blood Glucose Monitoring Suppl (ACCU-CHEK AVIVA PLUS) W/DEVICE KIT Check BS bid DX 250.00 1 kit 0  . cholecalciferol (VITAMIN D) 1000 UNITS tablet Take 1,000 Units by mouth  daily.    . Cinnamon 500 MG TABS Take by mouth.    . ferrous sulfate 325 (65 FE) MG tablet Take 325 mg by mouth daily with breakfast.    . fluticasone (FLONASE) 50 MCG/ACT nasal spray Place 2 sprays into both nostrils daily. 16 g 6  . glucose blood (ACCU-CHEK AVIVA PLUS) test strip Check BS bid  Dx 250.00 - Also needs lancets disp - 100/11 refills  100 each 6  . Magnesium 500 MG TABS Take 500 mg by mouth daily.    . metFORMIN (GLUCOPHAGE) 1000 MG tablet Take 1 tablet (1,000 mg total) by mouth 2 (two) times daily with a meal. 180 tablet 3  . pantoprazole (PROTONIX) 40 MG tablet TAKE 1 TABLET TWICE DAILY 180 tablet 3  . pravastatin (PRAVACHOL) 20 MG tablet Take 1 tablet (20 mg total) by mouth daily. 90 tablet 3  . triamcinolone cream (KENALOG) 0.1 % Apply 1 application topically 2 (two) times daily. 30 g 0  . vitamin E 100 UNIT capsule Take 100 Units by mouth daily.    . pioglitazone (ACTOS) 30 MG tablet Take 1 tablet (30 mg total) by mouth daily. (Patient not taking: Reported on 10/08/2015) 90 tablet 3   No current facility-administered medications on file prior to visit.    Allergies  Allergen Reactions  . Contrast Media [Iodinated Diagnostic Agents] Nausea And Vomiting  . Penicillins Hives and Itching   Social History   Social History  . Marital status: Widowed    Spouse name: N/A  . Number of children: 4  . Years of education: N/A   Occupational History  . Unemployed    Social History Main Topics  . Smoking status: Current Every Day Smoker    Packs/day: 1.00    Years: 40.00    Types: Cigarettes  . Smokeless tobacco: Never Used  . Alcohol use No  . Drug use: No  . Sexual activity: Not on file   Other Topics Concern  . Not on file   Social History Narrative  . No narrative on file     Review of Systems  All other systems reviewed and are negative.      Objective:   Physical Exam  Cardiovascular: Normal rate, regular rhythm, normal heart sounds and intact distal pulses.   Pulmonary/Chest: Effort normal and breath sounds normal. No respiratory distress. She has no wheezes. She has no rales. She exhibits no tenderness.  Abdominal: Soft. Bowel sounds are normal. She exhibits no distension. There is no tenderness. There is no rebound and no guarding.  Musculoskeletal: She exhibits no edema.  Vitals  reviewed.         Assessment & Plan:  Diabetes mellitus without complication (HCC)  HLD (hyperlipidemia)  Elevated LFTs  Fatty liver  Benign essential HTN Add losartan 25 mg poqday for elevated blood pressure and renal protection.  Diabetes is well controlled but I would continue metformin at the present time. Liver function tests are elevated I believe this is due to elevation in her triglycerides. Patient has CT scan of the abdomen and pelvis in 2013 which revealed fatty infiltration of the liver. I explained the patient that I believe she has metabolic syndrome and also fatty liver disease associated with a. The treatment for this is weight loss diet and exercise. I recommended 1 hour a day of aerobic exercise 5 days a week. I recommended 15 pounds weight loss over the next 6 months. I recommended a low carbohydrate low saturated fat diet.  I recommended avoidance of alcohol and Tylenol as much as possible. Recheck in 6 months.

## 2015-10-11 ENCOUNTER — Other Ambulatory Visit: Payer: Self-pay | Admitting: Family Medicine

## 2015-10-11 DIAGNOSIS — E119 Type 2 diabetes mellitus without complications: Secondary | ICD-10-CM

## 2016-06-09 ENCOUNTER — Encounter: Payer: Self-pay | Admitting: Family Medicine

## 2016-06-09 ENCOUNTER — Ambulatory Visit (INDEPENDENT_AMBULATORY_CARE_PROVIDER_SITE_OTHER): Payer: Medicare HMO | Admitting: Family Medicine

## 2016-06-09 VITALS — BP 150/88 | HR 84 | Temp 98.1°F | Resp 18 | Ht 63.0 in | Wt 146.0 lb

## 2016-06-09 DIAGNOSIS — R238 Other skin changes: Secondary | ICD-10-CM | POA: Diagnosis not present

## 2016-06-09 DIAGNOSIS — I10 Essential (primary) hypertension: Secondary | ICD-10-CM | POA: Diagnosis not present

## 2016-06-09 MED ORDER — MOMETASONE FUROATE 0.1 % EX CREA: 1.0000 "application " | TOPICAL_CREAM | Freq: Every day | CUTANEOUS | 0 refills | Status: DC

## 2016-06-09 NOTE — Progress Notes (Signed)
Subjective:    Patient ID: Rhonda Fields, female    DOB: 12/30/1950, 66 y.o.   MRN: [id number]  HPI  Patient reports a two-week history of bumps on her right knee. There are 2 bumps. One is located over the anterior tibial tubercle. One is located inferior and medially to that bone. Each polyp is approximately 2-3 mm in size. At the center of each papule is a small 1 mm to 2 mm hard yellow vesicle on an erythematous base. It seems to be some type of localized allergic reaction possibly to an insect bite. There is no other rash anywhere on the body. She is completely asymptomatic Past Medical History:  Diagnosis Date  . Anxiety   . Arrhythmia   . Chronic headaches   . Depression   . Diabetes mellitus without complication ([place])   . GERD (gastroesophageal reflux disease)   . Hard of hearing   . IBS (irritable bowel syndrome) 1986   Past Surgical History:  Procedure Laterality Date  . ABDOMINAL HYSTERECTOMY    . CHOLECYSTECTOMY    . ERCP  04/20/2011   Procedure: ENDOSCOPIC RETROGRADE CHOLANGIOPANCREATOGRAPHY (ERCP);  Surgeon: Inda Castle, MD;  Location: [facility];  Service: Endoscopy;  Laterality: N/A;  case is at 1430 in or  . ERCP  04/20/2011   Procedure: ENDOSCOPIC RETROGRADE CHOLANGIOPANCREATOGRAPHY (ERCP);  Surgeon: Inda Castle, MD;  Location: [facility];  Service: Gastroenterology;  Laterality: N/A;   Current Outpatient Prescriptions on File Prior to Visit  Medication Sig Dispense Refill  . ACCU-CHEK SOFTCLIX LANCETS lancets Check bs bid dx-250.00 200 each 3  . Blood Glucose Monitoring Suppl (ACCU-CHEK AVIVA PLUS) W/DEVICE KIT Check BS bid DX 250.00 1 kit 0  . cholecalciferol (VITAMIN D) 1000 UNITS tablet Take 1,000 Units by mouth daily.    . Cinnamon 500 MG TABS Take by mouth.    . ferrous sulfate 325 (65 FE) MG tablet Take 325 mg by mouth daily with breakfast.    . fluticasone (FLONASE) 50 MCG/ACT nasal spray Place 2 sprays into both nostrils daily. 16 g 6  .  glucose blood (ACCU-CHEK AVIVA PLUS) test strip Check BS bid  Dx 250.00 - Also needs lancets disp - 100/11 refills 100 each 6  . HYDROcodone-acetaminophen (NORCO/VICODIN) 5-325 MG tablet Take 1 tablet by mouth every 6 (six) hours as needed for severe pain (Uses only for severe back pain). 30 tablet 0  . losartan (COZAAR) 25 MG tablet Take 1 tablet (25 mg total) by mouth daily. 90 tablet 3  . Magnesium 500 MG TABS Take 500 mg by mouth daily.    . metFORMIN (GLUCOPHAGE) 1000 MG tablet TAKE 1 TABLET TWICE DAILY  WITH  A  MEAL 180 tablet 3  . pantoprazole (PROTONIX) 40 MG tablet TAKE 1 TABLET TWICE DAILY 180 tablet 3  . pioglitazone (ACTOS) 30 MG tablet Take 1 tablet (30 mg total) by mouth daily. 90 tablet 3  . pravastatin (PRAVACHOL) 20 MG tablet TAKE 1 TABLET ONE TIME DAILY 90 tablet 3  . triamcinolone cream (KENALOG) 0.1 % Apply 1 application topically 2 (two) times daily. 30 g 0  . vitamin E 100 UNIT capsule Take 100 Units by mouth daily.     No current facility-administered medications on file prior to visit.    Allergies  Allergen Reactions  . Contrast Media [Iodinated Diagnostic Agents] Nausea And Vomiting  . Penicillins Hives and Itching   Social History   Social History  . Marital status: Widowed  Spouse name: N/A  . Number of children: 4  . Years of education: N/A   Occupational History  . Unemployed    Social History Main Topics  . Smoking status: Current Every Day Smoker    Packs/day: 1.00    Years: 40.00    Types: Cigarettes  . Smokeless tobacco: Never Used  . Alcohol use No  . Drug use: No  . Sexual activity: Not on file   Other Topics Concern  . Not on file   Social History Narrative  . No narrative on file     Review of Systems  All other systems reviewed and are negative.      Objective:   Physical Exam  Constitutional: She appears well-developed and well-nourished.  Cardiovascular: Normal rate, regular rhythm and normal heart sounds.     Pulmonary/Chest: Effort normal and breath sounds normal.  Skin: Rash noted.  Vitals reviewed.         Assessment & Plan:  Dermatitis NOS/papular urticaria I believe this is a localized allergic reaction possibly to an insect bite. I recommended Elocon ointment once daily applied to the area for the next 2 weeks and then recheck. If persistent, we could biopsy. These do not appear to be cancerous lesions. Blood pressures elevated so I recommended she increase losartan from 25-50 mg a day and recheck blood pressure in one month

## 2016-07-06 ENCOUNTER — Other Ambulatory Visit: Payer: Medicare HMO

## 2016-07-06 DIAGNOSIS — E119 Type 2 diabetes mellitus without complications: Secondary | ICD-10-CM | POA: Diagnosis not present

## 2016-07-06 DIAGNOSIS — E785 Hyperlipidemia, unspecified: Secondary | ICD-10-CM

## 2016-07-06 LAB — CBC WITH DIFFERENTIAL/PLATELET
BASOS ABS: 0 {cells}/uL (ref 0–200)
Basophils Relative: 0 %
EOS ABS: 270 {cells}/uL (ref 15–500)
EOS PCT: 3 %
HCT: 42.8 % (ref 35.0–45.0)
Hemoglobin: 13.9 g/dL (ref 12.0–15.0)
Lymphocytes Relative: 26 %
Lymphs Abs: 2340 cells/uL (ref 850–3900)
MCH: 30.2 pg (ref 27.0–33.0)
MCHC: 32.5 g/dL (ref 32.0–36.0)
MCV: 93 fL (ref 80.0–100.0)
MPV: 8.7 fL (ref 7.5–12.5)
Monocytes Absolute: 450 cells/uL (ref 200–950)
Monocytes Relative: 5 %
NEUTROS PCT: 66 %
Neutro Abs: 5940 cells/uL (ref 1500–7800)
Platelets: 324 10*3/uL (ref 140–400)
RBC: 4.6 MIL/uL (ref 3.80–5.10)
RDW: 14 % (ref 11.0–15.0)
WBC: 9 10*3/uL (ref 3.8–10.8)

## 2016-07-06 LAB — COMPLETE METABOLIC PANEL WITH GFR
ALBUMIN: 3.9 g/dL (ref 3.6–5.1)
ALK PHOS: 93 U/L (ref 33–130)
ALT: 68 U/L — ABNORMAL HIGH (ref 6–29)
AST: 65 U/L — AB (ref 10–35)
BUN: 8 mg/dL (ref 7–25)
CO2: 24 mmol/L (ref 20–31)
Calcium: 9.6 mg/dL (ref 8.6–10.4)
Chloride: 102 mmol/L (ref 98–110)
Creat: 0.68 mg/dL (ref 0.50–0.99)
GFR, Est African American: >89 mL/min (ref >=60)
GFR, Est Non African American: >89 mL/min (ref >=60)
GLUCOSE: 135 mg/dL — AB (ref 70–99)
POTASSIUM: 4.3 mmol/L (ref 3.5–5.3)
SODIUM: 139 mmol/L (ref 135–146)
TOTAL PROTEIN: 7.3 g/dL (ref 6.1–8.1)
Total Bilirubin: 0.5 mg/dL (ref 0.2–1.2)

## 2016-07-06 LAB — LIPID PANEL
CHOL/HDL RATIO: 5.1 ratio — AB (ref <5.0)
Cholesterol: 211 mg/dL — ABNORMAL HIGH (ref <200)
HDL: 41 mg/dL — AB (ref >50)
LDL CALC: 108 mg/dL — AB (ref <100)
Triglycerides: 309 mg/dL — ABNORMAL HIGH (ref <150)
VLDL: 62 mg/dL — AB (ref <30)

## 2016-07-07 ENCOUNTER — Encounter: Payer: Self-pay | Admitting: Family Medicine

## 2016-07-07 ENCOUNTER — Ambulatory Visit (INDEPENDENT_AMBULATORY_CARE_PROVIDER_SITE_OTHER): Payer: Medicare HMO | Admitting: Family Medicine

## 2016-07-07 VITALS — BP 146/90 | HR 82 | Temp 98.3°F | Resp 16 | Ht 63.0 in | Wt 148.0 lb

## 2016-07-07 DIAGNOSIS — I10 Essential (primary) hypertension: Secondary | ICD-10-CM

## 2016-07-07 DIAGNOSIS — E7800 Pure hypercholesterolemia, unspecified: Secondary | ICD-10-CM

## 2016-07-07 DIAGNOSIS — E118 Type 2 diabetes mellitus with unspecified complications: Secondary | ICD-10-CM

## 2016-07-07 DIAGNOSIS — E119 Type 2 diabetes mellitus without complications: Secondary | ICD-10-CM | POA: Diagnosis not present

## 2016-07-07 LAB — HEMOGLOBIN A1C
HEMOGLOBIN A1C: 5.9 % — AB (ref <5.7)
Mean Plasma Glucose: 123 mg/dL

## 2016-07-07 MED ORDER — METFORMIN HCL 1000 MG PO TABS: ORAL_TABLET | ORAL | 3 refills | Status: DC

## 2016-07-07 MED ORDER — HYDROCODONE-ACETAMINOPHEN 5-325 MG PO TABS: 1.0000 | ORAL_TABLET | Freq: Four times a day (QID) | ORAL | 0 refills | Status: DC | PRN

## 2016-07-07 MED ORDER — PRAVASTATIN SODIUM 20 MG PO TABS: ORAL_TABLET | ORAL | 3 refills | Status: DC

## 2016-07-07 MED ORDER — PIOGLITAZONE HCL 30 MG PO TABS: 30.0000 mg | ORAL_TABLET | Freq: Every day | ORAL | 3 refills | Status: DC

## 2016-07-07 MED ORDER — LOSARTAN POTASSIUM 50 MG PO TABS: 50.0000 mg | ORAL_TABLET | Freq: Every day | ORAL | 3 refills | Status: DC

## 2016-07-07 MED ORDER — ATORVASTATIN CALCIUM 40 MG PO TABS
40.0000 mg | ORAL_TABLET | Freq: Every day | ORAL | 3 refills | Status: DC
Start: 2016-07-07 — End: 2016-07-10

## 2016-07-07 MED ORDER — PANTOPRAZOLE SODIUM 40 MG PO TBEC: 40.0000 mg | DELAYED_RELEASE_TABLET | Freq: Two times a day (BID) | ORAL | 3 refills | Status: DC

## 2016-07-07 NOTE — Progress Notes (Signed)
Subjective:    Patient ID: Rhonda Fields, female    DOB: 20-Jan-1951, 66 y.o.   MRN: [id number]  Medication Refill   09/2015 Patient is a very pleasant 66 year old female who is here today for regular follow-up of her diabetes. Her blood pressure is elevated at 146/92. Diabetic eye exam was performed last week.  She is taking an aspirin on a daily basis. She refuses flu shot.  She denies any chest pain shortness of breath or dyspnea on exertion. She denies any myalgias or right upper quadrant pain. She denies any polyuria, polydipsia, or blurred vision. Her colonoscopy is up-to-date. Diabetic foot exam is up-to-date today.  At that time, my plan was: Add losartan 25 mg poqday for elevated blood pressure and renal protection.  Diabetes is well controlled but I would continue metformin at the present time. Liver function tests are elevated I believe this is due to elevation in her triglycerides. Patient has CT scan of the abdomen and pelvis in 2013 which revealed fatty infiltration of the liver. I explained the patient that I believe she has metabolic syndrome and also fatty liver disease associated with a. The treatment for this is weight loss diet and exercise. I recommended 1 hour a day of aerobic exercise 5 days a week. I recommended 15 pounds weight loss over the next 6 months. I recommended a low carbohydrate low saturated fat diet. I recommended avoidance of alcohol and Tylenol as much as possible. Recheck in 6 months.  07/07/17 Here today for follow up. Her hemoglobin A1c continues to drop. Today is 5.9. I'm very proud of the patient for this. She would like to try to cut back on her metformin. Unfortunately her LDL cholesterol has risen to 108. Her triglycerides remain significantly elevated. Patient is Native American and therefore I believe much of her dyslipidemia is genetic. She is eating lots of sour its. She is refraining from eating a high saturated fat diet. She could possibly  exercise more but truly her diet would not explain her dyslipidemia. Her blood pressure today is slightly elevated. She is taking losartan 50 mg a day. She denies any chest pain shortness of breath or dyspnea on exertion. Most recent labs are listed below:  Appointment on 07/06/2016  Component Date Value Ref Range Status  . Sodium 07/06/2016 139  135 - 146 mmol/L Final  . Potassium 07/06/2016 4.3  3.5 - 5.3 mmol/L Final  . Chloride 07/06/2016 102  98 - 110 mmol/L Final  . CO2 07/06/2016 24  20 - 31 mmol/L Final  . Glucose, Bld 07/06/2016 135* 70 - 99 mg/dL Final  . BUN 07/06/2016 8  7 - 25 mg/dL Final  . Creat 07/06/2016 0.68  0.50 - 0.99 mg/dL Final   Comment:   For patients > or = 65 years of age: The upper reference limit for Creatinine is approximately 13% higher for people identified as African-American.     . Total Bilirubin 07/06/2016 0.5  0.2 - 1.2 mg/dL Final  . Alkaline Phosphatase 07/06/2016 93  33 - 130 U/L Final  . AST 07/06/2016 65* 10 - 35 U/L Final  . ALT 07/06/2016 68* 6 - 29 U/L Final  . Total Protein 07/06/2016 7.3  6.1 - 8.1 g/dL Final  . Albumin 07/06/2016 3.9  3.6 - 5.1 g/dL Final  . Calcium 07/06/2016 9.6  8.6 - 10.4 mg/dL Final  . GFR, Est African American 07/06/2016 >89  >=60 mL/min Final  . GFR, Est Non African American  07/06/2016 >89  >=60 mL/min Final  . WBC 07/06/2016 9.0  3.8 - 10.8 K/uL Final  . RBC 07/06/2016 4.60  3.80 - 5.10 MIL/uL Final  . Hemoglobin 07/06/2016 13.9  12.0 - 15.0 g/dL Final  . HCT 07/06/2016 42.8  35.0 - 45.0 % Final  . MCV 07/06/2016 93.0  80.0 - 100.0 fL Final  . MCH 07/06/2016 30.2  27.0 - 33.0 pg Final  . MCHC 07/06/2016 32.5  32.0 - 36.0 g/dL Final  . RDW 07/06/2016 14.0  11.0 - 15.0 % Final  . Platelets 07/06/2016 324  140 - 400 K/uL Final  . MPV 07/06/2016 8.7  7.5 - 12.5 fL Final  . Neutro Abs 07/06/2016 5940  1,500 - 7,800 cells/uL Final  . Lymphs Abs 07/06/2016 2340  850 - 3,900 cells/uL Final  . Monocytes Absolute  07/06/2016 450  200 - 950 cells/uL Final  . Eosinophils Absolute 07/06/2016 270  15 - 500 cells/uL Final  . Basophils Absolute 07/06/2016 0  0 - 200 cells/uL Final  . Neutrophils Relative % 07/06/2016 66  % Final  . Lymphocytes Relative 07/06/2016 26  % Final  . Monocytes Relative 07/06/2016 5  % Final  . Eosinophils Relative 07/06/2016 3  % Final  . Basophils Relative 07/06/2016 0  % Final  . Smear Review 07/06/2016 Criteria for review not met   Final  . Hgb A1c MFr Bld 07/06/2016 5.9* <5.7 % Final   Comment:   For someone without known diabetes, a hemoglobin A1c value between 5.7% and 6.4% is consistent with prediabetes and should be confirmed with a follow-up test.   For someone with known diabetes, a value <7% indicates that their diabetes is well controlled. A1c targets should be individualized based on duration of diabetes, age, co-morbid conditions and other considerations.   This assay result is consistent with an increased risk of diabetes.   Currently, no consensus exists regarding use of hemoglobin A1c for diagnosis of diabetes in children.     . Mean Plasma Glucose 07/06/2016 123  mg/dL Final  . Cholesterol 07/06/2016 211* <200 mg/dL Final  . Triglycerides 07/06/2016 309* <150 mg/dL Final  . HDL 07/06/2016 41* >50 mg/dL Final  . Total CHOL/HDL Ratio 07/06/2016 5.1* <5.0 Ratio Final  . VLDL 07/06/2016 62* <30 mg/dL Final  . LDL Cholesterol 07/06/2016 108* <100 mg/dL Final   Past Medical History:  Diagnosis Date  . Anxiety   . Arrhythmia   . Chronic headaches   . Depression   . Diabetes mellitus without complication ([place])   . GERD (gastroesophageal reflux disease)   . Hard of hearing   . IBS (irritable bowel syndrome) 1986   Past Surgical History:  Procedure Laterality Date  . ABDOMINAL HYSTERECTOMY    . CHOLECYSTECTOMY    . ERCP  04/20/2011   Procedure: ENDOSCOPIC RETROGRADE CHOLANGIOPANCREATOGRAPHY (ERCP);  Surgeon: Inda Castle, MD;  Location: [facility];  Service: Endoscopy;  Laterality: N/A;  case is at 1430 in or  . ERCP  04/20/2011   Procedure: ENDOSCOPIC RETROGRADE CHOLANGIOPANCREATOGRAPHY (ERCP);  Surgeon: Inda Castle, MD;  Location: [facility];  Service: Gastroenterology;  Laterality: N/A;   Current Outpatient Prescriptions on File Prior to Visit  Medication Sig Dispense Refill  . ACCU-CHEK SOFTCLIX LANCETS lancets Check bs bid dx-250.00 200 each 3  . Blood Glucose Monitoring Suppl (ACCU-CHEK AVIVA PLUS) W/DEVICE KIT Check BS bid DX 250.00 1 kit 0  . cholecalciferol (VITAMIN D) 1000 UNITS tablet Take 1,000 Units by  mouth daily.    . Cinnamon 500 MG TABS Take by mouth.    . ferrous sulfate 325 (65 FE) MG tablet Take 325 mg by mouth daily with breakfast.    . fluticasone (FLONASE) 50 MCG/ACT nasal spray Place 2 sprays into both nostrils daily. 16 g 6  . glucose blood (ACCU-CHEK AVIVA PLUS) test strip Check BS bid  Dx 250.00 - Also needs lancets disp - 100/11 refills 100 each 6  . HYDROcodone-acetaminophen (NORCO/VICODIN) 5-325 MG tablet Take 1 tablet by mouth every 6 (six) hours as needed for severe pain (Uses only for severe back pain). 30 tablet 0  . losartan (COZAAR) 25 MG tablet Take 1 tablet (25 mg total) by mouth daily. 90 tablet 3  . Magnesium 500 MG TABS Take 500 mg by mouth daily.    . metFORMIN (GLUCOPHAGE) 1000 MG tablet TAKE 1 TABLET TWICE DAILY  WITH  A  MEAL 180 tablet 3  . mometasone (ELOCON) 0.1 % cream Apply 1 application topically daily. 45 g 0  . pantoprazole (PROTONIX) 40 MG tablet TAKE 1 TABLET TWICE DAILY 180 tablet 3  . pioglitazone (ACTOS) 30 MG tablet Take 1 tablet (30 mg total) by mouth daily. 90 tablet 3  . pravastatin (PRAVACHOL) 20 MG tablet TAKE 1 TABLET ONE TIME DAILY 90 tablet 3  . triamcinolone cream (KENALOG) 0.1 % Apply 1 application topically 2 (two) times daily. 30 g 0  . vitamin E 100 UNIT capsule Take 100 Units by mouth daily.     No current facility-administered medications on file prior to  visit.    Allergies  Allergen Reactions  . Contrast Media [Iodinated Diagnostic Agents] Nausea And Vomiting  . Penicillins Hives and Itching   Social History   Social History  . Marital status: Widowed    Spouse name: N/A  . Number of children: 4  . Years of education: N/A   Occupational History  . Unemployed    Social History Main Topics  . Smoking status: Current Every Day Smoker    Packs/day: 1.00    Years: 40.00    Types: Cigarettes  . Smokeless tobacco: Never Used  . Alcohol use No  . Drug use: No  . Sexual activity: Not on file   Other Topics Concern  . Not on file   Social History Narrative  . No narrative on file     Review of Systems  All other systems reviewed and are negative.      Objective:   Physical Exam  Cardiovascular: Normal rate, regular rhythm, normal heart sounds and intact distal pulses.   Pulmonary/Chest: Effort normal and breath sounds normal. No respiratory distress. She has no wheezes. She has no rales. She exhibits no tenderness.  Abdominal: Soft. Bowel sounds are normal. She exhibits no distension. There is no tenderness. There is no rebound and no guarding.  Musculoskeletal: She exhibits no edema.  Vitals reviewed.         Assessment & Plan:  Benign essential HTN  Diabetes mellitus without complication ([place]) - Plan: metFORMIN (GLUCOPHAGE) 1000 MG tablet  Pure hypercholesterolemia  Diabetic complication ([place]) - Plan: pioglitazone (ACTOS) 30 MG tablet  Patient will monitor blood pressure closely and notify me of the values in 2 weeks. If consistently greater than 140/90, I will increase losartan to 100 mg a day. Discontinue pravastatin and replaced Lipitor 40 mg a day. Recheck fasting lipid panel in 3 months along with LFTs. The patient has fatty liver disease. Hopefully by correcting  her dyslipidemia we can improve this. Diabetes is excellent. We will decrease her metformin to 500 mg twice a day and recheck labs in 6 months.   I did refill her hydrocodone which she takes sparingly for low back pain

## 2016-07-09 ENCOUNTER — Ambulatory Visit: Payer: Medicare HMO | Admitting: Family Medicine

## 2016-07-10 ENCOUNTER — Other Ambulatory Visit: Payer: Self-pay | Admitting: Family Medicine

## 2016-07-10 MED ORDER — ATORVASTATIN CALCIUM 40 MG PO TABS: 40.0000 mg | ORAL_TABLET | Freq: Every day | ORAL | 3 refills | Status: DC

## 2016-09-29 ENCOUNTER — Other Ambulatory Visit: Payer: Self-pay | Admitting: Family Medicine

## 2016-10-23 ENCOUNTER — Other Ambulatory Visit: Payer: Self-pay

## 2016-10-23 DIAGNOSIS — E118 Type 2 diabetes mellitus with unspecified complications: Secondary | ICD-10-CM

## 2016-10-23 MED ORDER — PIOGLITAZONE HCL 30 MG PO TABS: 30.0000 mg | ORAL_TABLET | Freq: Every day | ORAL | 3 refills | Status: DC

## 2016-10-23 MED ORDER — LOSARTAN POTASSIUM 50 MG PO TABS: 50.0000 mg | ORAL_TABLET | Freq: Every day | ORAL | 3 refills | Status: DC

## 2017-03-22 ENCOUNTER — Ambulatory Visit: Payer: Medicare HMO | Admitting: Family Medicine

## 2017-03-22 ENCOUNTER — Encounter: Payer: Self-pay | Admitting: Family Medicine

## 2017-03-22 VITALS — BP 98/58 | HR 124 | Temp 101.9°F | Resp 18 | Ht 63.0 in | Wt 159.0 lb

## 2017-03-22 DIAGNOSIS — I952 Hypotension due to drugs: Secondary | ICD-10-CM | POA: Diagnosis not present

## 2017-03-22 DIAGNOSIS — J189 Pneumonia, unspecified organism: Secondary | ICD-10-CM

## 2017-03-22 DIAGNOSIS — R6889 Other general symptoms and signs: Secondary | ICD-10-CM | POA: Diagnosis not present

## 2017-03-22 DIAGNOSIS — J181 Lobar pneumonia, unspecified organism: Secondary | ICD-10-CM | POA: Diagnosis not present

## 2017-03-22 DIAGNOSIS — R509 Fever, unspecified: Secondary | ICD-10-CM

## 2017-03-22 LAB — INFLUENZA A AND B AG, IMMUNOASSAY
INFLUENZA A ANTIGEN: NOT DETECTED
INFLUENZA B ANTIGEN: NOT DETECTED

## 2017-03-22 MED ORDER — LEVOFLOXACIN 500 MG PO TABS: 500.0000 mg | ORAL_TABLET | Freq: Every day | ORAL | 0 refills | Status: DC

## 2017-03-22 NOTE — Progress Notes (Signed)
Subjective:    Patient ID: Rhonda Fields, female    DOB: 29-Jul-1950, 67 y.o.   MRN: [id number]  HPI  Patient developed flulike symptoms last week on Monday and Tuesday.  She reports fevers chills cough and body aches.  However by Wednesday, the symptoms have completely subsided and she was back to her normal state of health.  She was doing well Wednesday Thursday Friday and Saturday.  Yesterday symptoms returned with high fever, body aches, and cough productive of yellow sputum.  Today she reports bibasilar pleurisy, mild shortness of breath, fever, chills, and worsening cough.  Physical exam is consistent with community-acquired pneumonia she has bibasilar crackles.  There is no evidence of JVD or peripheral edema on exam.  Symptoms are concerning for bibasilar pneumonia.  Patient also has a low blood pressure and appears slightly dehydrated.  Her flu test is negative Past Medical History:  Diagnosis Date  . Anxiety   . Arrhythmia   . Chronic headaches   . Depression   . Diabetes mellitus without complication ([place])   . GERD (gastroesophageal reflux disease)   . Hard of hearing   . IBS (irritable bowel syndrome) 1986   Past Surgical History:  Procedure Laterality Date  . ABDOMINAL HYSTERECTOMY    . CHOLECYSTECTOMY    . ERCP  04/20/2011   Procedure: ENDOSCOPIC RETROGRADE CHOLANGIOPANCREATOGRAPHY (ERCP);  Surgeon: Inda Castle, MD;  Location: [facility];  Service: Endoscopy;  Laterality: N/A;  case is at 1430 in or  . ERCP  04/20/2011   Procedure: ENDOSCOPIC RETROGRADE CHOLANGIOPANCREATOGRAPHY (ERCP);  Surgeon: Inda Castle, MD;  Location: [facility];  Service: Gastroenterology;  Laterality: N/A;   Current Outpatient Medications on File Prior to Visit  Medication Sig Dispense Refill  . ACCU-CHEK SOFTCLIX LANCETS lancets Check bs bid dx-250.00 200 each 3  . atorvastatin (LIPITOR) 40 MG tablet Take 1 tablet (40 mg total) by mouth daily. 90 tablet 3  . Blood Glucose Monitoring  Suppl (ACCU-CHEK AVIVA PLUS) W/DEVICE KIT Check BS bid DX 250.00 1 kit 0  . cholecalciferol (VITAMIN D) 1000 UNITS tablet Take 1,000 Units by mouth daily.    . Cinnamon 500 MG TABS Take by mouth.    . ferrous sulfate 325 (65 FE) MG tablet Take 325 mg by mouth daily with breakfast.    . fluticasone (FLONASE) 50 MCG/ACT nasal spray Place 2 sprays into both nostrils daily. 16 g 6  . glucose blood (ACCU-CHEK AVIVA PLUS) test strip Check BS bid  Dx 250.00 - Also needs lancets disp - 100/11 refills 100 each 6  . HYDROcodone-acetaminophen (NORCO/VICODIN) 5-325 MG tablet Take 1 tablet by mouth every 6 (six) hours as needed for severe pain (Uses only for severe back pain). 30 tablet 0  . losartan (COZAAR) 50 MG tablet Take 1 tablet (50 mg total) by mouth daily. 90 tablet 3  . Magnesium 500 MG TABS Take 500 mg by mouth daily.    . metFORMIN (GLUCOPHAGE) 1000 MG tablet TAKE 1 TABLET TWICE DAILY  WITH  A  MEAL 180 tablet 3  . mometasone (ELOCON) 0.1 % cream Apply 1 application topically daily. 45 g 0  . pantoprazole (PROTONIX) 40 MG tablet Take 1 tablet (40 mg total) by mouth 2 (two) times daily. 180 tablet 3  . pioglitazone (ACTOS) 30 MG tablet Take 1 tablet (30 mg total) by mouth daily. 90 tablet 3  . triamcinolone cream (KENALOG) 0.1 % Apply 1 application topically 2 (two) times daily. 30 g  0  . vitamin E 100 UNIT capsule Take 100 Units by mouth daily.     No current facility-administered medications on file prior to visit.    Allergies  Allergen Reactions  . Contrast Media [Iodinated Diagnostic Agents] Nausea And Vomiting  . Penicillins Hives and Itching   Social History   Socioeconomic History  . Marital status: Widowed    Spouse name: Not on file  . Number of children: 4  . Years of education: Not on file  . Highest education level: Not on file  Social Needs  . Financial resource strain: Not on file  . Food insecurity - worry: Not on file  . Food insecurity - inability: Not on file  .  Transportation needs - medical: Not on file  . Transportation needs - non-medical: Not on file  Occupational History  . Occupation: Unemployed  Tobacco Use  . Smoking status: Current Every Day Smoker    Packs/day: 1.00    Years: 40.00    Pack years: 40.00    Types: Cigarettes  . Smokeless tobacco: Never Used  Substance and Sexual Activity  . Alcohol use: No  . Drug use: No  . Sexual activity: Not on file  Other Topics Concern  . Not on file  Social History Narrative  . Not on file     Review of Systems  All other systems reviewed and are negative.      Objective:   Physical Exam  Constitutional: She appears well-developed and well-nourished. No distress.  HENT:  Right Ear: External ear normal.  Left Ear: External ear normal.  Nose: Nose normal.  Mouth/Throat: Oropharynx is clear and moist. No oropharyngeal exudate.  Eyes: Conjunctivae and EOM are normal. Pupils are equal, round, and reactive to light. Right eye exhibits no discharge. Left eye exhibits no discharge. No scleral icterus.  Neck: Neck supple. No JVD present.  Cardiovascular: Normal rate, regular rhythm and normal heart sounds.  No murmur heard. Pulmonary/Chest: Effort normal. No accessory muscle usage. No tachypnea. No respiratory distress. She has decreased breath sounds. She has rhonchi. She has rales.      Lymphadenopathy:    She has no cervical adenopathy.  Skin: She is not diaphoretic.  Vitals reviewed.         Assessment & Plan:  Flu-like symptoms - Plan: Influenza A and B Ag, Immunoassay  Fever, unspecified fever cause - Plan: Influenza A and B Ag, Immunoassay  Community acquired pneumonia of left lower lobe of lung ([place])  Patient had a flulike illness 1 week ago and has had a sudden exacerbation suggesting community-acquired pneumonia or secondary pneumonia.  Flu test today is negative.  Start Levaquin 500 mg p.o. daily for 7 days and reassess in 48 hours or sooner if worse.   Temporarily discontinue losartan.  Also discontinue metformin.  Push fluids.  Patient's borderline hypoxic at 90% on room air.  If symptoms worsen, she is to go the hospital immediately.

## 2017-03-25 ENCOUNTER — Ambulatory Visit: Payer: Medicare HMO | Admitting: Family Medicine

## 2017-03-25 ENCOUNTER — Encounter: Payer: Self-pay | Admitting: Family Medicine

## 2017-03-25 VITALS — BP 110/70 | HR 86 | Temp 97.6°F | Resp 20 | Ht 63.0 in | Wt 156.0 lb

## 2017-03-25 DIAGNOSIS — J189 Pneumonia, unspecified organism: Secondary | ICD-10-CM

## 2017-03-25 DIAGNOSIS — J181 Lobar pneumonia, unspecified organism: Secondary | ICD-10-CM

## 2017-03-25 MED ORDER — CEFTRIAXONE SODIUM 1 G IJ SOLR
1.0000 g | Freq: Once | INTRAMUSCULAR | Status: AC
  Administered 2017-03-25: 1 g via INTRAMUSCULAR

## 2017-03-25 NOTE — Addendum Note (Signed)
Addended by: Sheral Flow on: 03/25/2017 09:38 AM   Modules accepted: Orders

## 2017-03-25 NOTE — Progress Notes (Signed)
Subjective:    Patient ID: Rhonda Fields, female    DOB: 1950/08/03, 67 y.o.   MRN: [id number]  HPI 03/22/17 Patient developed flulike symptoms last week on Monday and Tuesday.  She reports fevers chills cough and body aches.  However by Wednesday, the symptoms have completely subsided and she was back to her normal state of health.  She was doing well Wednesday Thursday Friday and Saturday.  Yesterday symptoms returned with high fever, body aches, and cough productive of yellow sputum.  Today she reports bibasilar pleurisy, mild shortness of breath, fever, chills, and worsening cough.  Physical exam is consistent with community-acquired pneumonia she has bibasilar crackles.  There is no evidence of JVD or peripheral edema on exam.  Symptoms are concerning for bibasilar pneumonia.  Patient also has a low blood pressure and appears slightly dehydrated.  Her flu test is negative.  At that time, my plan was: Patient had a flulike illness 1 week ago and has had a sudden exacerbation suggesting community-acquired pneumonia or secondary pneumonia.  Flu test today is negative.  Start Levaquin 500 mg p.o. daily for 7 days and reassess in 48 hours or sooner if worse.  Temporarily discontinue losartan.  Also discontinue metformin.  Push fluids.  Patient's borderline hypoxic at 90% on room air.  If symptoms worsen, she is to go the hospital immediately.  03/25/17 Patient looks much better than her last office visit. The crackles in her right lung have improved dramatically. Her pulse oximetry has improved. However she continues to have thick crackles and rails in the left base. She also continues to report left-sided pleurisy despite being on Levaquin. She is afebrile. She denies any shortness of breath Past Medical History:  Diagnosis Date  . Anxiety   . Arrhythmia   . Chronic headaches   . Depression   . Diabetes mellitus without complication ([place])   . GERD (gastroesophageal reflux disease)   .  Hard of hearing   . IBS (irritable bowel syndrome) 1986   Past Surgical History:  Procedure Laterality Date  . ABDOMINAL HYSTERECTOMY    . CHOLECYSTECTOMY    . ERCP  04/20/2011   Procedure: ENDOSCOPIC RETROGRADE CHOLANGIOPANCREATOGRAPHY (ERCP);  Surgeon: Inda Castle, MD;  Location: [facility];  Service: Endoscopy;  Laterality: N/A;  case is at 1430 in or  . ERCP  04/20/2011   Procedure: ENDOSCOPIC RETROGRADE CHOLANGIOPANCREATOGRAPHY (ERCP);  Surgeon: Inda Castle, MD;  Location: [facility];  Service: Gastroenterology;  Laterality: N/A;   Current Outpatient Medications on File Prior to Visit  Medication Sig Dispense Refill  . ACCU-CHEK SOFTCLIX LANCETS lancets Check bs bid dx-250.00 200 each 3  . atorvastatin (LIPITOR) 40 MG tablet Take 1 tablet (40 mg total) by mouth daily. 90 tablet 3  . Blood Glucose Monitoring Suppl (ACCU-CHEK AVIVA PLUS) W/DEVICE KIT Check BS bid DX 250.00 1 kit 0  . cholecalciferol (VITAMIN D) 1000 UNITS tablet Take 1,000 Units by mouth daily.    . Cinnamon 500 MG TABS Take by mouth.    . ferrous sulfate 325 (65 FE) MG tablet Take 325 mg by mouth daily with breakfast.    . fluticasone (FLONASE) 50 MCG/ACT nasal spray Place 2 sprays into both nostrils daily. 16 g 6  . glucose blood (ACCU-CHEK AVIVA PLUS) test strip Check BS bid  Dx 250.00 - Also needs lancets disp - 100/11 refills 100 each 6  . HYDROcodone-acetaminophen (NORCO/VICODIN) 5-325 MG tablet Take 1 tablet by mouth every 6 (six) hours  as needed for severe pain (Uses only for severe back pain). 30 tablet 0  . levofloxacin (LEVAQUIN) 500 MG tablet Take 1 tablet (500 mg total) by mouth daily. 7 tablet 0  . losartan (COZAAR) 50 MG tablet Take 1 tablet (50 mg total) by mouth daily. 90 tablet 3  . Magnesium 500 MG TABS Take 500 mg by mouth daily.    . metFORMIN (GLUCOPHAGE) 1000 MG tablet TAKE 1 TABLET TWICE DAILY  WITH  A  MEAL 180 tablet 3  . mometasone (ELOCON) 0.1 % cream Apply 1 application topically daily. 45  g 0  . pantoprazole (PROTONIX) 40 MG tablet Take 1 tablet (40 mg total) by mouth 2 (two) times daily. 180 tablet 3  . pioglitazone (ACTOS) 30 MG tablet Take 1 tablet (30 mg total) by mouth daily. 90 tablet 3  . triamcinolone cream (KENALOG) 0.1 % Apply 1 application topically 2 (two) times daily. 30 g 0  . vitamin E 100 UNIT capsule Take 100 Units by mouth daily.     No current facility-administered medications on file prior to visit.    Allergies  Allergen Reactions  . Contrast Media [Iodinated Diagnostic Agents] Nausea And Vomiting  . Penicillins Hives and Itching   Social History   Socioeconomic History  . Marital status: Widowed    Spouse name: Not on file  . Number of children: 4  . Years of education: Not on file  . Highest education level: Not on file  Social Needs  . Financial resource strain: Not on file  . Food insecurity - worry: Not on file  . Food insecurity - inability: Not on file  . Transportation needs - medical: Not on file  . Transportation needs - non-medical: Not on file  Occupational History  . Occupation: Unemployed  Tobacco Use  . Smoking status: Current Every Day Smoker    Packs/day: 1.00    Years: 40.00    Pack years: 40.00    Types: Cigarettes  . Smokeless tobacco: Never Used  Substance and Sexual Activity  . Alcohol use: No  . Drug use: No  . Sexual activity: Not on file  Other Topics Concern  . Not on file  Social History Narrative  . Not on file     Review of Systems  All other systems reviewed and are negative.      Objective:   Physical Exam  Constitutional: She appears well-developed and well-nourished. No distress.  HENT:  Right Ear: External ear normal.  Left Ear: External ear normal.  Nose: Nose normal.  Mouth/Throat: Oropharynx is clear and moist. No oropharyngeal exudate.  Eyes: Conjunctivae and EOM are normal. Pupils are equal, round, and reactive to light. Right eye exhibits no discharge. Left eye exhibits no  discharge. No scleral icterus.  Neck: Neck supple. No JVD present.  Cardiovascular: Normal rate, regular rhythm and normal heart sounds.  No murmur heard. Pulmonary/Chest: Effort normal. No accessory muscle usage. No tachypnea. No respiratory distress. She has decreased breath sounds. She has rhonchi. She has rales.      Lymphadenopathy:    She has no cervical adenopathy.  Skin: She is not diaphoretic.  Vitals reviewed.         Assessment & Plan:  Community acquired pneumonia of left lower lobe of lung ([place]) - Plan: DG Chest 2 View Clinically the patient is much improved compared to earlier this week. The right side essentially sounds normal. She continues to have thick crackles and rails in the lower  left lung concerning for residual pneumonia. Patient's listed allergy to penicillin was from childhood. She states that she is taking penicillin since without any reaction. Therefore will try 1 g of Rocephin IM. I have recommended a chest x-ray to rule out empyema. Patient refuses chest x-ray today. She states that she's feeling better. She would like to wait and reassess on Monday. If symptoms worsen she is to go to the hospital

## 2017-03-29 ENCOUNTER — Encounter: Payer: Self-pay | Admitting: Family Medicine

## 2017-03-29 ENCOUNTER — Ambulatory Visit: Payer: Medicare HMO | Admitting: Family Medicine

## 2017-03-29 VITALS — BP 130/70 | HR 92 | Temp 98.6°F | Resp 18 | Ht 63.0 in | Wt 155.0 lb

## 2017-03-29 DIAGNOSIS — J181 Lobar pneumonia, unspecified organism: Secondary | ICD-10-CM | POA: Diagnosis not present

## 2017-03-29 DIAGNOSIS — J189 Pneumonia, unspecified organism: Secondary | ICD-10-CM

## 2017-03-29 NOTE — Progress Notes (Signed)
Subjective:    Patient ID: Rhonda Fields, female    DOB: May 03, 1950, 67 y.o.   MRN: [id number]  HPI 03/22/17 Patient developed flulike symptoms last week on Monday and Tuesday.  She reports fevers chills cough and body aches.  However by Wednesday, the symptoms have completely subsided and she was back to her normal state of health.  She was doing well Wednesday Thursday Friday and Saturday.  Yesterday symptoms returned with high fever, body aches, and cough productive of yellow sputum.  Today she reports bibasilar pleurisy, mild shortness of breath, fever, chills, and worsening cough.  Physical exam is consistent with community-acquired pneumonia she has bibasilar crackles.  There is no evidence of JVD or peripheral edema on exam.  Symptoms are concerning for bibasilar pneumonia.  Patient also has a low blood pressure and appears slightly dehydrated.  Her flu test is negative.  At that time, my plan was: Patient had a flulike illness 1 week ago and has had a sudden exacerbation suggesting community-acquired pneumonia or secondary pneumonia.  Flu test today is negative.  Start Levaquin 500 mg p.o. daily for 7 days and reassess in 48 hours or sooner if worse.  Temporarily discontinue losartan.  Also discontinue metformin.  Push fluids.  Patient's borderline hypoxic at 90% on room air.  If symptoms worsen, she is to go the hospital immediately.  03/25/17 Patient looks much better than her last office visit. The crackles in her right lung have improved dramatically. Her pulse oximetry has improved. However she continues to have thick crackles and rails in the left base. She also continues to report left-sided pleurisy despite being on Levaquin. She is afebrile. She denies any shortness of breath.  At that time, my plan was: Clinically the patient is much improved compared to earlier this week. The right side essentially sounds normal. She continues to have thick crackles and rails in the lower left  lung concerning for residual pneumonia. Patient's listed allergy to penicillin was from childhood. She states that she is taking penicillin since without any reaction. Therefore will try 1 g of Rocephin IM. I have recommended a chest x-ray to rule out empyema. Patient refuses chest x-ray today. She states that she's feeling better. She would like to wait and reassess on Monday. If symptoms worsen she is to go to the hospital  03/29/17 Clinically the patient continues to improve. She states that the left-sided pleurisy is much better. She denies any fevers or chills. Her shortness of breath is better. She is almost completed her antibiotics. Unfortunately on her exam, she still has pronounced left basilar crackles that have not improved with rales also.   Past Medical History:  Diagnosis Date  . Anxiety   . Arrhythmia   . Chronic headaches   . Depression   . Diabetes mellitus without complication ([place])   . GERD (gastroesophageal reflux disease)   . Hard of hearing   . IBS (irritable bowel syndrome) 1986   Past Surgical History:  Procedure Laterality Date  . ABDOMINAL HYSTERECTOMY    . CHOLECYSTECTOMY    . ERCP  04/20/2011   Procedure: ENDOSCOPIC RETROGRADE CHOLANGIOPANCREATOGRAPHY (ERCP);  Surgeon: Inda Castle, MD;  Location: [facility];  Service: Endoscopy;  Laterality: N/A;  case is at 1430 in or  . ERCP  04/20/2011   Procedure: ENDOSCOPIC RETROGRADE CHOLANGIOPANCREATOGRAPHY (ERCP);  Surgeon: Inda Castle, MD;  Location: [facility];  Service: Gastroenterology;  Laterality: N/A;   Current Outpatient Medications on File Prior to  Visit  Medication Sig Dispense Refill  . ACCU-CHEK SOFTCLIX LANCETS lancets Check bs bid dx-250.00 200 each 3  . atorvastatin (LIPITOR) 40 MG tablet Take 1 tablet (40 mg total) by mouth daily. 90 tablet 3  . Blood Glucose Monitoring Suppl (ACCU-CHEK AVIVA PLUS) W/DEVICE KIT Check BS bid DX 250.00 1 kit 0  . cholecalciferol (VITAMIN D) 1000 UNITS tablet Take 1,000  Units by mouth daily.    . Cinnamon 500 MG TABS Take by mouth.    . ferrous sulfate 325 (65 FE) MG tablet Take 325 mg by mouth daily with breakfast.    . fluticasone (FLONASE) 50 MCG/ACT nasal spray Place 2 sprays into both nostrils daily. 16 g 6  . glucose blood (ACCU-CHEK AVIVA PLUS) test strip Check BS bid  Dx 250.00 - Also needs lancets disp - 100/11 refills 100 each 6  . HYDROcodone-acetaminophen (NORCO/VICODIN) 5-325 MG tablet Take 1 tablet by mouth every 6 (six) hours as needed for severe pain (Uses only for severe back pain). 30 tablet 0  . losartan (COZAAR) 50 MG tablet Take 1 tablet (50 mg total) by mouth daily. 90 tablet 3  . Magnesium 500 MG TABS Take 500 mg by mouth daily.    . metFORMIN (GLUCOPHAGE) 1000 MG tablet TAKE 1 TABLET TWICE DAILY  WITH  A  MEAL 180 tablet 3  . mometasone (ELOCON) 0.1 % cream Apply 1 application topically daily. 45 g 0  . pantoprazole (PROTONIX) 40 MG tablet Take 1 tablet (40 mg total) by mouth 2 (two) times daily. 180 tablet 3  . pioglitazone (ACTOS) 30 MG tablet Take 1 tablet (30 mg total) by mouth daily. 90 tablet 3  . triamcinolone cream (KENALOG) 0.1 % Apply 1 application topically 2 (two) times daily. 30 g 0  . vitamin E 100 UNIT capsule Take 100 Units by mouth daily.     No current facility-administered medications on file prior to visit.    Allergies  Allergen Reactions  . Contrast Media [Iodinated Diagnostic Agents] Nausea And Vomiting  . Penicillins Hives and Itching   Social History   Socioeconomic History  . Marital status: Widowed    Spouse name: Not on file  . Number of children: 4  . Years of education: Not on file  . Highest education level: Not on file  Social Needs  . Financial resource strain: Not on file  . Food insecurity - worry: Not on file  . Food insecurity - inability: Not on file  . Transportation needs - medical: Not on file  . Transportation needs - non-medical: Not on file  Occupational History  . Occupation:  Unemployed  Tobacco Use  . Smoking status: Current Every Day Smoker    Packs/day: 1.00    Years: 40.00    Pack years: 40.00    Types: Cigarettes  . Smokeless tobacco: Never Used  Substance and Sexual Activity  . Alcohol use: No  . Drug use: No  . Sexual activity: Not on file  Other Topics Concern  . Not on file  Social History Narrative  . Not on file     Review of Systems  All other systems reviewed and are negative.      Objective:   Physical Exam  Constitutional: She appears well-developed and well-nourished. No distress.  HENT:  Right Ear: External ear normal.  Left Ear: External ear normal.  Nose: Nose normal.  Mouth/Throat: Oropharynx is clear and moist. No oropharyngeal exudate.  Eyes: Conjunctivae and EOM are normal.  Pupils are equal, round, and reactive to light. Right eye exhibits no discharge. Left eye exhibits no discharge. No scleral icterus.  Neck: Neck supple. No JVD present.  Cardiovascular: Normal rate, regular rhythm and normal heart sounds.  No murmur heard. Pulmonary/Chest: Effort normal. No accessory muscle usage. No tachypnea. No respiratory distress. She has decreased breath sounds. She has rhonchi. She has rales.      Lymphadenopathy:    She has no cervical adenopathy.  Skin: She is not diaphoretic.  Vitals reviewed.         Assessment & Plan:  Pneumonia of left lower lobe due to infectious organism [facility]) - Plan: DG Chest 2 View  I am concerned about postobstructive pneumonia versus empyema however clinically patient is improving dramatically. I have recommended a chest x-ray again however the patient states she feels much better and she does not want to spend the money will cause her to get a chest x-ray. Instead she would like to give it 1 more week and then come back for recheck with the caveat that if her symptoms worsen or return, she will get a chest x-ray immediately. My medical advice is to get a chest x-ray today however the  patient politely declines and defers until next week for recheck.

## 2017-04-05 ENCOUNTER — Encounter: Payer: Self-pay | Admitting: Family Medicine

## 2017-04-05 ENCOUNTER — Ambulatory Visit: Payer: Medicare HMO | Admitting: Family Medicine

## 2017-04-05 VITALS — BP 140/74 | HR 74 | Temp 98.1°F | Resp 18 | Ht 63.0 in | Wt 159.0 lb

## 2017-04-05 DIAGNOSIS — J181 Lobar pneumonia, unspecified organism: Secondary | ICD-10-CM | POA: Diagnosis not present

## 2017-04-05 DIAGNOSIS — J189 Pneumonia, unspecified organism: Secondary | ICD-10-CM

## 2017-04-05 NOTE — Progress Notes (Signed)
Subjective:    Patient ID: Rhonda Fields, female    DOB: 04-09-1950, 67 y.o.   MRN: [id number]  HPI 03/22/17 Patient developed flulike symptoms last week on Monday and Tuesday.  She reports fevers chills cough and body aches.  However by Wednesday, the symptoms have completely subsided and she was back to her normal state of health.  She was doing well Wednesday Thursday Friday and Saturday.  Yesterday symptoms returned with high fever, body aches, and cough productive of yellow sputum.  Today she reports bibasilar pleurisy, mild shortness of breath, fever, chills, and worsening cough.  Physical exam is consistent with community-acquired pneumonia she has bibasilar crackles.  There is no evidence of JVD or peripheral edema on exam.  Symptoms are concerning for bibasilar pneumonia.  Patient also has a low blood pressure and appears slightly dehydrated.  Her flu test is negative.  At that time, my plan was: Patient had a flulike illness 1 week ago and has had a sudden exacerbation suggesting community-acquired pneumonia or secondary pneumonia.  Flu test today is negative.  Start Levaquin 500 mg p.o. daily for 7 days and reassess in 48 hours or sooner if worse.  Temporarily discontinue losartan.  Also discontinue metformin.  Push fluids.  Patient's borderline hypoxic at 90% on room air.  If symptoms worsen, she is to go the hospital immediately.  03/25/17 Patient looks much better than her last office visit. The crackles in her right lung have improved dramatically. Her pulse oximetry has improved. However she continues to have thick crackles and rails in the left base. She also continues to report left-sided pleurisy despite being on Levaquin. She is afebrile. She denies any shortness of breath.  At that time, my plan was: Clinically the patient is much improved compared to earlier this week. The right side essentially sounds normal. She continues to have thick crackles and rails in the lower left  lung concerning for residual pneumonia. Patient's listed allergy to penicillin was from childhood. She states that she is taking penicillin since without any reaction. Therefore will try 1 g of Rocephin IM. I have recommended a chest x-ray to rule out empyema. Patient refuses chest x-ray today. She states that she's feeling better. She would like to wait and reassess on Monday. If symptoms worsen she is to go to the hospital  03/29/17 Clinically the patient continues to improve. She states that the left-sided pleurisy is much better. She denies any fevers or chills. Her shortness of breath is better. She is almost completed her antibiotics. Unfortunately on her exam, she still has pronounced left basilar crackles that have not improved with rales also.  At that time, my plan was: I am concerned about postobstructive pneumonia versus empyema however clinically patient is improving dramatically. I have recommended a chest x-ray again however the patient states she feels much better and she does not want to spend the money will cause her to get a chest x-ray. Instead she would like to give it 1 more week and then come back for recheck with the caveat that if her symptoms worsen or return, she will get a chest x-ray immediately. My medical advice is to get a chest x-ray today however the patient politely declines and defers until next week for recheck.  04/05/17 Patient is feeling much better.  Her cough is completely resolved.  She has no further fevers.  She is almost back to her baseline.  Her energy has improved dramatically over the weekend. Past  Medical History:  Diagnosis Date  . Anxiety   . Arrhythmia   . Chronic headaches   . Depression   . Diabetes mellitus without complication ([place])   . GERD (gastroesophageal reflux disease)   . Hard of hearing   . IBS (irritable bowel syndrome) 1986   Past Surgical History:  Procedure Laterality Date  . ABDOMINAL HYSTERECTOMY    . CHOLECYSTECTOMY    .  ERCP  04/20/2011   Procedure: ENDOSCOPIC RETROGRADE CHOLANGIOPANCREATOGRAPHY (ERCP);  Surgeon: Inda Castle, MD;  Location: [facility];  Service: Endoscopy;  Laterality: N/A;  case is at 1430 in or  . ERCP  04/20/2011   Procedure: ENDOSCOPIC RETROGRADE CHOLANGIOPANCREATOGRAPHY (ERCP);  Surgeon: Inda Castle, MD;  Location: [facility];  Service: Gastroenterology;  Laterality: N/A;   Current Outpatient Medications on File Prior to Visit  Medication Sig Dispense Refill  . ACCU-CHEK SOFTCLIX LANCETS lancets Check bs bid dx-250.00 200 each 3  . atorvastatin (LIPITOR) 40 MG tablet Take 1 tablet (40 mg total) by mouth daily. 90 tablet 3  . Blood Glucose Monitoring Suppl (ACCU-CHEK AVIVA PLUS) W/DEVICE KIT Check BS bid DX 250.00 1 kit 0  . cholecalciferol (VITAMIN D) 1000 UNITS tablet Take 1,000 Units by mouth daily.    . Cinnamon 500 MG TABS Take by mouth.    . ferrous sulfate 325 (65 FE) MG tablet Take 325 mg by mouth daily with breakfast.    . fluticasone (FLONASE) 50 MCG/ACT nasal spray Place 2 sprays into both nostrils daily. 16 g 6  . glucose blood (ACCU-CHEK AVIVA PLUS) test strip Check BS bid  Dx 250.00 - Also needs lancets disp - 100/11 refills 100 each 6  . HYDROcodone-acetaminophen (NORCO/VICODIN) 5-325 MG tablet Take 1 tablet by mouth every 6 (six) hours as needed for severe pain (Uses only for severe back pain). 30 tablet 0  . losartan (COZAAR) 50 MG tablet Take 1 tablet (50 mg total) by mouth daily. 90 tablet 3  . Magnesium 500 MG TABS Take 500 mg by mouth daily.    . metFORMIN (GLUCOPHAGE) 1000 MG tablet TAKE 1 TABLET TWICE DAILY  WITH  A  MEAL 180 tablet 3  . mometasone (ELOCON) 0.1 % cream Apply 1 application topically daily. 45 g 0  . pantoprazole (PROTONIX) 40 MG tablet Take 1 tablet (40 mg total) by mouth 2 (two) times daily. 180 tablet 3  . pioglitazone (ACTOS) 30 MG tablet Take 1 tablet (30 mg total) by mouth daily. 90 tablet 3  . triamcinolone cream (KENALOG) 0.1 % Apply 1  application topically 2 (two) times daily. 30 g 0  . vitamin E 100 UNIT capsule Take 100 Units by mouth daily.     No current facility-administered medications on file prior to visit.    Allergies  Allergen Reactions  . Contrast Media [Iodinated Diagnostic Agents] Nausea And Vomiting  . Penicillins Hives and Itching   Social History   Socioeconomic History  . Marital status: Widowed    Spouse name: Not on file  . Number of children: 4  . Years of education: Not on file  . Highest education level: Not on file  Social Needs  . Financial resource strain: Not on file  . Food insecurity - worry: Not on file  . Food insecurity - inability: Not on file  . Transportation needs - medical: Not on file  . Transportation needs - non-medical: Not on file  Occupational History  . Occupation: Unemployed  Tobacco Use  .  Smoking status: Current Every Day Smoker    Packs/day: 1.00    Years: 40.00    Pack years: 40.00    Types: Cigarettes  . Smokeless tobacco: Never Used  Substance and Sexual Activity  . Alcohol use: No  . Drug use: No  . Sexual activity: Not on file  Other Topics Concern  . Not on file  Social History Narrative  . Not on file     Review of Systems  All other systems reviewed and are negative.      Objective:   Physical Exam  Constitutional: She appears well-developed and well-nourished. No distress.  HENT:  Right Ear: External ear normal.  Left Ear: External ear normal.  Nose: Nose normal.  Mouth/Throat: Oropharynx is clear and moist. No oropharyngeal exudate.  Eyes: Conjunctivae and EOM are normal. Pupils are equal, round, and reactive to light. Right eye exhibits no discharge. Left eye exhibits no discharge. No scleral icterus.  Neck: Neck supple. No JVD present.  Cardiovascular: Normal rate, regular rhythm and normal heart sounds.  No murmur heard. Pulmonary/Chest: Effort normal. No accessory muscle usage. No tachypnea. No respiratory distress. She has  no decreased breath sounds. She has no wheezes. She has no rhonchi. She has no rales.  Lymphadenopathy:    She has no cervical adenopathy.  Skin: She is not diaphoretic.  Vitals reviewed.         Assessment & Plan:  Pneumonia of left lower lobe due to infectious organism ([place]) The crackles and rales at the bottom of her left lower lung have completely resolved.  Her breath sounds have improved dramatically and clinically she is back to her baseline.  No further follow-up is necessary.

## 2017-07-27 ENCOUNTER — Other Ambulatory Visit: Payer: Self-pay | Admitting: Family Medicine

## 2017-07-27 DIAGNOSIS — E119 Type 2 diabetes mellitus without complications: Secondary | ICD-10-CM

## 2017-08-03 ENCOUNTER — Other Ambulatory Visit: Payer: Self-pay | Admitting: Family Medicine

## 2017-08-03 ENCOUNTER — Telehealth: Payer: Self-pay | Admitting: Family Medicine

## 2017-08-03 DIAGNOSIS — E119 Type 2 diabetes mellitus without complications: Secondary | ICD-10-CM

## 2017-08-03 NOTE — Telephone Encounter (Signed)
Lab ordered.

## 2017-08-03 NOTE — Telephone Encounter (Signed)
Called patient per the Quality Metric report.   Patient has 3 gaps. They include mammogram, diabetic eye exam, and A1C.   I have also looked to see when their last AWV was and if they need that appointment I scheduled. She declines AWV at this time  I have reached out to patient to see if they have had appointments to close the gaps. Patient declines a mammogram, she is trying to find a provider that she trust to have her diabetic eye exam, and I have scheduled her for lab work 08/09/17.  Please place order for:  A1C patient is scheduled for 08/09/17 her last lab was done on 07/06/16.

## 2017-08-03 NOTE — Telephone Encounter (Signed)
ok 

## 2017-08-03 NOTE — Telephone Encounter (Signed)
OK to order 

## 2017-08-09 ENCOUNTER — Other Ambulatory Visit: Payer: Medicare HMO

## 2017-08-09 DIAGNOSIS — E119 Type 2 diabetes mellitus without complications: Secondary | ICD-10-CM | POA: Diagnosis not present

## 2017-08-10 LAB — HEMOGLOBIN A1C
EAG (MMOL/L): 11.2 (calc)
Hgb A1c MFr Bld: 8.7 % of total Hgb — ABNORMAL HIGH (ref <5.7)
Mean Plasma Glucose: 203 (calc)

## 2017-08-16 ENCOUNTER — Ambulatory Visit: Payer: Medicare HMO | Admitting: Family Medicine

## 2017-08-20 ENCOUNTER — Encounter: Payer: Self-pay | Admitting: Family Medicine

## 2017-08-20 ENCOUNTER — Ambulatory Visit
Admission: RE | Admit: 2017-08-20 | Discharge: 2017-08-20 | Disposition: A | Discharge status: Home / Self Care | Payer: Medicare HMO | Source: Ambulatory Visit | Attending: Family Medicine | Admitting: Family Medicine

## 2017-08-20 ENCOUNTER — Other Ambulatory Visit: Payer: Self-pay | Admitting: Family Medicine

## 2017-08-20 ENCOUNTER — Ambulatory Visit (INDEPENDENT_AMBULATORY_CARE_PROVIDER_SITE_OTHER): Payer: Medicare HMO | Admitting: Family Medicine

## 2017-08-20 VITALS — BP 100/60 | HR 98 | Temp 98.0°F | Resp 16 | Ht 63.0 in | Wt 153.0 lb

## 2017-08-20 DIAGNOSIS — E7800 Pure hypercholesterolemia, unspecified: Secondary | ICD-10-CM | POA: Diagnosis not present

## 2017-08-20 DIAGNOSIS — M545 Low back pain, unspecified: Secondary | ICD-10-CM

## 2017-08-20 DIAGNOSIS — G8929 Other chronic pain: Secondary | ICD-10-CM | POA: Diagnosis not present

## 2017-08-20 DIAGNOSIS — I10 Essential (primary) hypertension: Secondary | ICD-10-CM | POA: Diagnosis not present

## 2017-08-20 DIAGNOSIS — E118 Type 2 diabetes mellitus with unspecified complications: Secondary | ICD-10-CM | POA: Diagnosis not present

## 2017-08-20 DIAGNOSIS — M4726 Other spondylosis with radiculopathy, lumbar region: Secondary | ICD-10-CM | POA: Diagnosis not present

## 2017-08-20 LAB — BASIC METABOLIC PANEL WITH GFR
BUN: 10 mg/dL (ref 7–25)
CO2: 24 mmol/L (ref 20–32)
CREATININE: 0.95 mg/dL (ref 0.50–0.99)
Calcium: 10.2 mg/dL (ref 8.6–10.4)
Chloride: 101 mmol/L (ref 98–110)
GFR, EST NON AFRICAN AMERICAN: 63 mL/min/{1.73_m2} (ref >=60)
GFR, Est African American: 73 mL/min/{1.73_m2} (ref >=60)
Glucose, Bld: 119 mg/dL — ABNORMAL HIGH (ref 65–99)
Potassium: 4.5 mmol/L (ref 3.5–5.3)
SODIUM: 137 mmol/L (ref 135–146)

## 2017-08-20 LAB — EXTRA LAV TOP TUBE

## 2017-08-20 MED ORDER — ACCU-CHEK SOFTCLIX LANCETS MISC: 3 refills | Status: DC

## 2017-08-20 MED ORDER — GLUCOSE BLOOD VI STRP: ORAL_STRIP | 6 refills | Status: DC

## 2017-08-20 MED ORDER — HYDROCODONE-ACETAMINOPHEN 5-325 MG PO TABS: 1.0000 | ORAL_TABLET | Freq: Four times a day (QID) | ORAL | 0 refills | Status: DC | PRN

## 2017-08-20 MED ORDER — ACCU-CHEK AVIVA PLUS W/DEVICE KIT: PACK | 0 refills | Status: DC

## 2017-08-20 MED ORDER — ACCU-CHEK MULTICLIX LANCETS MISC: 12 refills | Status: DC

## 2017-08-20 NOTE — Progress Notes (Signed)
Subjective:    Patient ID: Rhonda Fields, female    DOB: 07-29-1950, 67 y.o.   MRN: [id number]  Medication Refill   After the patient's last pneumonia, she discontinued metformin.  She discontinued Actos.  She wanted to see if she can manage her diabetes on her own.  On her recent lab work, her hemoglobin A1c was found to be 8.7.  Apparently there was an error with her lab work and a fasting lipid panel and a CMP was not drawn.  She also did not have a urine microalbumin.  She denies any polyuria, polydipsia, or blurry vision.  She denies any chest pain shortness of breath or dyspnea on exertion.  She is asking for refill on her Norco.  30 tablets to the last for more than a year.  She has a history of chronic low back pain due to to "a back injury she suffered while out [place]".  She has not had any recent x-rays.  She uses the medication sparingly only for severe pain that is unrelieved by ibuprofen.  She denies any sciatica.  She denies any bowel or bladder incontinence.  She denies any leg weakness or numbness Past Medical History:  Diagnosis Date  . Anxiety   . Arrhythmia   . Chronic headaches   . Depression   . Diabetes mellitus without complication ([place])   . GERD (gastroesophageal reflux disease)   . Hard of hearing   . IBS (irritable bowel syndrome) 1986   Past Surgical History:  Procedure Laterality Date  . ABDOMINAL HYSTERECTOMY    . CHOLECYSTECTOMY    . ERCP  04/20/2011   Procedure: ENDOSCOPIC RETROGRADE CHOLANGIOPANCREATOGRAPHY (ERCP);  Surgeon: Inda Castle, MD;  Location: [facility];  Service: Endoscopy;  Laterality: N/A;  case is at 1430 in or  . ERCP  04/20/2011   Procedure: ENDOSCOPIC RETROGRADE CHOLANGIOPANCREATOGRAPHY (ERCP);  Surgeon: Inda Castle, MD;  Location: [facility];  Service: Gastroenterology;  Laterality: N/A;   Current Outpatient Medications on File Prior to Visit  Medication Sig Dispense Refill  . atorvastatin (LIPITOR) 40 MG tablet TAKE 1 TABLET  BY MOUTH DAILY. 90 tablet 3  . Blood Glucose Monitoring Suppl (ACCU-CHEK AVIVA PLUS) W/DEVICE KIT Check BS bid DX 250.00 1 kit 0  . cholecalciferol (VITAMIN D) 1000 UNITS tablet Take 1,000 Units by mouth daily.    . Cinnamon 500 MG TABS Take by mouth.    . fluticasone (FLONASE) 50 MCG/ACT nasal spray Place 2 sprays into both nostrils daily. 16 g 6  . losartan (COZAAR) 50 MG tablet Take 1 tablet (50 mg total) by mouth daily. 90 tablet 3  . metFORMIN (GLUCOPHAGE) 1000 MG tablet TAKE 1 TABLET TWICE DAILY  WITH  MEALS 180 tablet 3  . mometasone (ELOCON) 0.1 % cream Apply 1 application topically daily. 45 g 0  . pantoprazole (PROTONIX) 40 MG tablet TAKE 1 TABLET BY MOUTH TWICE DAILY 180 tablet 3  . pioglitazone (ACTOS) 30 MG tablet Take 1 tablet (30 mg total) by mouth daily. 90 tablet 3  . triamcinolone cream (KENALOG) 0.1 % Apply 1 application topically 2 (two) times daily. 30 g 0  . vitamin E 100 UNIT capsule Take 100 Units by mouth daily.    . Magnesium 500 MG TABS Take 500 mg by mouth daily.     No current facility-administered medications on file prior to visit.    Allergies  Allergen Reactions  . Contrast Media [Iodinated Diagnostic Agents] Nausea And Vomiting  .  Penicillins Hives and Itching   Social History   Socioeconomic History  . Marital status: Widowed    Spouse name: Not on file  . Number of children: 4  . Years of education: Not on file  . Highest education level: Not on file  Occupational History  . Occupation: Unemployed  Social Needs  . Financial resource strain: Not on file  . Food insecurity:    Worry: Not on file    Inability: Not on file  . Transportation needs:    Medical: Not on file    Non-medical: Not on file  Tobacco Use  . Smoking status: Current Every Day Smoker    Packs/day: 1.00    Years: 40.00    Pack years: 40.00    Types: Cigarettes  . Smokeless tobacco: Never Used  Substance and Sexual Activity  . Alcohol use: No  . Drug use: No  . Sexual  activity: Not on file  Lifestyle  . Physical activity:    Days per week: Not on file    Minutes per session: Not on file  . Stress: Not on file  Relationships  . Social connections:    Talks on phone: Not on file    Gets together: Not on file    Attends religious service: Not on file    Active member of club or organization: Not on file    Attends meetings of clubs or organizations: Not on file    Relationship status: Not on file  . Intimate partner violence:    Fear of current or ex partner: Not on file    Emotionally abused: Not on file    Physically abused: Not on file    Forced sexual activity: Not on file  Other Topics Concern  . Not on file  Social History Narrative  . Not on file     Review of Systems  All other systems reviewed and are negative.      Objective:   Physical Exam  Cardiovascular: Normal rate, regular rhythm, normal heart sounds and intact distal pulses.  Pulmonary/Chest: Effort normal and breath sounds normal. No respiratory distress. She has no wheezes. She has no rales. She exhibits no tenderness.  Abdominal: Soft. Bowel sounds are normal. She exhibits no distension. There is no tenderness. There is no rebound and no guarding.  Musculoskeletal: She exhibits no edema.  Vitals reviewed.         Assessment & Plan:  Chronic midline low back pain without sciatica - Plan: DG Lumbar Spine Complete  Controlled type 2 diabetes mellitus with complication, without long-term current use of insulin ([place]) - Plan: BASIC METABOLIC PANEL WITH GFR, Microalbumin, urine  Pure hypercholesterolemia  Benign essential HTN  Blood pressure is relatively low.  I recommended that she decrease her losartan to 25 mg a day to avoid hypotension.  She will resume metformin and Actos at their previous dosages and we will recheck a hemoglobin A1c in 3 months.  She will continue Lipitor to reduce her risk of cardiovascular disease.  I did asked the patient to get a lumbar  spine x-ray to confirm the presence of degenerative disc disease as a potential cause of her low back pain.  I am comfortable with her using the medication sparingly as she tends to take only a few pills each month when ibuprofen does not relieve her back pain.  I have no concern over abuse or diversion.  I did refill her hydrocodone which she takes sparingly for low back  pain

## 2017-08-21 LAB — MICROALBUMIN, URINE: Microalb, Ur: 0.7 mg/dL

## 2017-11-17 ENCOUNTER — Other Ambulatory Visit: Payer: Medicare HMO

## 2017-11-17 DIAGNOSIS — E7800 Pure hypercholesterolemia, unspecified: Secondary | ICD-10-CM

## 2017-11-17 DIAGNOSIS — I10 Essential (primary) hypertension: Secondary | ICD-10-CM

## 2017-11-17 DIAGNOSIS — E118 Type 2 diabetes mellitus with unspecified complications: Secondary | ICD-10-CM | POA: Diagnosis not present

## 2017-11-18 LAB — CBC WITH DIFFERENTIAL/PLATELET
BASOS ABS: 49 {cells}/uL (ref 0–200)
Basophils Relative: 0.6 %
EOS ABS: 180 {cells}/uL (ref 15–500)
Eosinophils Relative: 2.2 %
HCT: 40.8 % (ref 35.0–45.0)
Hemoglobin: 13.6 g/dL (ref 11.7–15.5)
Lymphs Abs: 1845 cells/uL (ref 850–3900)
MCH: 30.8 pg (ref 27.0–33.0)
MCHC: 33.3 g/dL (ref 32.0–36.0)
MCV: 92.5 fL (ref 80.0–100.0)
MONOS PCT: 4.6 %
MPV: 9.7 fL (ref 7.5–12.5)
Neutro Abs: 5748 cells/uL (ref 1500–7800)
Neutrophils Relative %: 70.1 %
PLATELETS: 245 10*3/uL (ref 140–400)
RBC: 4.41 10*6/uL (ref 3.80–5.10)
RDW: 12.5 % (ref 11.0–15.0)
TOTAL LYMPHOCYTE: 22.5 %
WBC mixed population: 377 cells/uL (ref 200–950)
WBC: 8.2 10*3/uL (ref 3.8–10.8)

## 2017-11-18 LAB — LIPID PANEL
Cholesterol: 140 mg/dL (ref <200)
HDL: 46 mg/dL — ABNORMAL LOW (ref >50)
LDL Cholesterol (Calc): 69 mg/dL (calc)
Non-HDL Cholesterol (Calc): 94 mg/dL (calc) (ref <130)
TRIGLYCERIDES: 174 mg/dL — AB (ref <150)
Total CHOL/HDL Ratio: 3 (calc) (ref <5.0)

## 2017-11-18 LAB — COMPREHENSIVE METABOLIC PANEL
AG RATIO: 1.5 (calc) (ref 1.0–2.5)
ALBUMIN MSPROF: 4.1 g/dL (ref 3.6–5.1)
ALT: 70 U/L — ABNORMAL HIGH (ref 6–29)
AST: 67 U/L — AB (ref 10–35)
Alkaline phosphatase (APISO): 92 U/L (ref 33–130)
BUN: 17 mg/dL (ref 7–25)
CHLORIDE: 103 mmol/L (ref 98–110)
CO2: 26 mmol/L (ref 20–32)
Calcium: 9.6 mg/dL (ref 8.6–10.4)
Creat: 0.94 mg/dL (ref 0.50–0.99)
GLUCOSE: 124 mg/dL — AB (ref 65–99)
Globulin: 2.8 g/dL (calc) (ref 1.9–3.7)
POTASSIUM: 4.6 mmol/L (ref 3.5–5.3)
SODIUM: 139 mmol/L (ref 135–146)
Total Bilirubin: 0.4 mg/dL (ref 0.2–1.2)
Total Protein: 6.9 g/dL (ref 6.1–8.1)

## 2017-11-18 LAB — HEMOGLOBIN A1C
EAG (MMOL/L): 7.6 (calc)
HEMOGLOBIN A1C: 6.4 %{Hb} — AB (ref <5.7)
MEAN PLASMA GLUCOSE: 137 (calc)

## 2017-11-19 ENCOUNTER — Encounter: Payer: Self-pay | Admitting: Family Medicine

## 2017-11-19 ENCOUNTER — Ambulatory Visit (INDEPENDENT_AMBULATORY_CARE_PROVIDER_SITE_OTHER): Payer: Medicare HMO | Admitting: Family Medicine

## 2017-11-19 ENCOUNTER — Other Ambulatory Visit: Payer: Self-pay

## 2017-11-19 VITALS — BP 118/70 | HR 76 | Ht 63.0 in | Wt 154.0 lb

## 2017-11-19 DIAGNOSIS — Z780 Asymptomatic menopausal state: Secondary | ICD-10-CM

## 2017-11-19 DIAGNOSIS — E118 Type 2 diabetes mellitus with unspecified complications: Secondary | ICD-10-CM

## 2017-11-19 DIAGNOSIS — E7800 Pure hypercholesterolemia, unspecified: Secondary | ICD-10-CM | POA: Diagnosis not present

## 2017-11-19 DIAGNOSIS — I10 Essential (primary) hypertension: Secondary | ICD-10-CM | POA: Diagnosis not present

## 2017-11-19 DIAGNOSIS — Z23 Encounter for immunization: Secondary | ICD-10-CM | POA: Diagnosis not present

## 2017-11-19 DIAGNOSIS — G8929 Other chronic pain: Secondary | ICD-10-CM

## 2017-11-19 DIAGNOSIS — M545 Low back pain: Secondary | ICD-10-CM

## 2017-11-19 DIAGNOSIS — K760 Fatty (change of) liver, not elsewhere classified: Secondary | ICD-10-CM

## 2017-11-19 MED ORDER — ACCU-CHEK SOFTCLIX LANCETS MISC: 3 refills | Status: DC

## 2017-11-19 MED ORDER — ACCU-CHEK MULTICLIX LANCETS MISC: 12 refills | Status: DC

## 2017-11-19 MED ORDER — GLUCOSE BLOOD VI STRP: ORAL_STRIP | 6 refills | Status: DC

## 2017-11-19 MED ORDER — HYDROCODONE-ACETAMINOPHEN 5-325 MG PO TABS: 1.0000 | ORAL_TABLET | Freq: Four times a day (QID) | ORAL | 0 refills | Status: DC | PRN

## 2017-11-19 NOTE — Progress Notes (Signed)
Subjective:    Patient ID: Rhonda Fields, female    DOB: Sep 16, 1950, 67 y.o.   MRN: [id number]  Medication Refill   Most recent labs are listed below.  Her hemoglobin A1c is excellent at 6.4.  She denies any hypoglycemic episodes.  She is checking her blood sugar every day and her blood sugars tend to vary between 101 160.  The vast majority are below 130.  She denies any polyuria, polydipsia, or blurry vision.  She is due for her second pneumonia shot as well as her flu shot.  She refuses them today but she states that she will come back next week to get them.  She has some where she has to go this morning and she does not want to get a vaccine prior to going there.  She is overdue for mammogram which she refuses out right.  She also refuses a colonoscopy.  She states that she does not want to know if she has cancer.  I have repeatedly told her I will be glad to schedule these if she so desires or if she changes her mind.  She is overdue for a bone density test but she will let me schedule this.  She is also due for a diabetic eye exam.  She is scheduled this in the next 2 weeks on her own.  She denies any chest pain shortness of breath or dyspnea on exertion.  She denies any myalgias or right upper quadrant pain.  The only significant abnormality on her lab work is elevated liver function test.  CT scan in 2013 revealed diffuse fatty infiltration.  She takes less than 1000 mg of Tylenol a day.  She is not drinking any alcohol.  Viral hepatitis serologies were negative in 2015.  Therefore I believe her liver function test are due to fatty liver disease.  Lab on 11/17/2017  Component Date Value Ref Range Status  . Hgb A1c MFr Bld 11/17/2017 6.4* <5.7 % of total Hgb Final   Comment: For someone without known diabetes, a hemoglobin  A1c value between 5.7% and 6.4% is consistent with prediabetes and should be confirmed with a  follow-up test. . For someone with known diabetes, a value  <7% indicates that their diabetes is well controlled. A1c targets should be individualized based on duration of diabetes, age, comorbid conditions, and other considerations. . This assay result is consistent with an increased risk of diabetes. . Currently, no consensus exists regarding use of hemoglobin A1c for diagnosis of diabetes for children. .   . Mean Plasma Glucose 11/17/2017 137  (calc) Final  . eAG (mmol/L) 11/17/2017 7.6  (calc) Final  . Cholesterol 11/17/2017 140  <200 mg/dL Final  . HDL 11/17/2017 46* >50 mg/dL Final  . Triglycerides 11/17/2017 174* <150 mg/dL Final  . LDL Cholesterol (Calc) 11/17/2017 69  mg/dL (calc) Final   Comment: Reference range: <100 . Desirable range <100 mg/dL for primary prevention;   <70 mg/dL for patients with CHD or diabetic patients  with > or = 2 CHD risk factors. Marland Kitchen LDL-C is now calculated using the Martin-Hopkins  calculation, which is a validated novel method providing  better accuracy than the Friedewald equation in the  estimation of LDL-C.  Cresenciano Genre et al. Annamaria Helling. 4503;888(28): 2061-2068  (http://education.[facility].com/faq/FAQ164)   . Total CHOL/HDL Ratio 11/17/2017 3.0  <5.0 (calc) Final  . Non-HDL Cholesterol (Calc) 11/17/2017 94  <130 mg/dL (calc) Final   Comment: For patients with diabetes plus 1 major ASCVD  risk  factor, treating to a non-HDL-C goal of <100 mg/dL  (LDL-C of <70 mg/dL) is considered a therapeutic  option.   . Glucose, Bld 11/17/2017 124* 65 - 99 mg/dL Final   Comment: .            Fasting reference interval . For someone without known diabetes, a glucose value between 100 and 125 mg/dL is consistent with prediabetes and should be confirmed with a follow-up test. .   . BUN 11/17/2017 17  7 - 25 mg/dL Final  . Creat 11/17/2017 0.94  0.50 - 0.99 mg/dL Final   Comment: For patients >47 years of age, the reference limit for Creatinine is approximately 13% higher for people identified as  African-American. .   Havery Moros Ratio 32/44/0102 NOT APPLICABLE  6 - 22 (calc) Final  . Sodium 11/17/2017 139  135 - 146 mmol/L Final  . Potassium 11/17/2017 4.6  3.5 - 5.3 mmol/L Final  . Chloride 11/17/2017 103  98 - 110 mmol/L Final  . CO2 11/17/2017 26  20 - 32 mmol/L Final  . Calcium 11/17/2017 9.6  8.6 - 10.4 mg/dL Final  . Total Protein 11/17/2017 6.9  6.1 - 8.1 g/dL Final  . Albumin 11/17/2017 4.1  3.6 - 5.1 g/dL Final  . Globulin 11/17/2017 2.8  1.9 - 3.7 g/dL (calc) Final  . AG Ratio 11/17/2017 1.5  1.0 - 2.5 (calc) Final  . Total Bilirubin 11/17/2017 0.4  0.2 - 1.2 mg/dL Final  . Alkaline phosphatase (APISO) 11/17/2017 92  33 - 130 U/L Final  . AST 11/17/2017 67* 10 - 35 U/L Final  . ALT 11/17/2017 70* 6 - 29 U/L Final  . WBC 11/17/2017 8.2  3.8 - 10.8 Thousand/uL Final  . RBC 11/17/2017 4.41  3.80 - 5.10 Million/uL Final  . Hemoglobin 11/17/2017 13.6  11.7 - 15.5 g/dL Final  . HCT 11/17/2017 40.8  35.0 - 45.0 % Final  . MCV 11/17/2017 92.5  80.0 - 100.0 fL Final  . MCH 11/17/2017 30.8  27.0 - 33.0 pg Final  . MCHC 11/17/2017 33.3  32.0 - 36.0 g/dL Final  . RDW 11/17/2017 12.5  11.0 - 15.0 % Final  . Platelets 11/17/2017 245  140 - 400 Thousand/uL Final  . MPV 11/17/2017 9.7  7.5 - 12.5 fL Final  . Neutro Abs 11/17/2017 5,748  1,500 - 7,800 cells/uL Final  . Lymphs Abs 11/17/2017 1,845  850 - 3,900 cells/uL Final  . WBC mixed population 11/17/2017 377  200 - 950 cells/uL Final  . Eosinophils Absolute 11/17/2017 180  15 - 500 cells/uL Final  . Basophils Absolute 11/17/2017 49  0 - 200 cells/uL Final  . Neutrophils Relative % 11/17/2017 70.1  % Final  . Total Lymphocyte 11/17/2017 22.5  % Final  . Monocytes Relative 11/17/2017 4.6  % Final  . Eosinophils Relative 11/17/2017 2.2  % Final  . Basophils Relative 11/17/2017 0.6  % Final   Past Medical History:  Diagnosis Date  . Anxiety   . Arrhythmia   . Chronic headaches   . Depression   . Diabetes mellitus  without complication ([place])   . GERD (gastroesophageal reflux disease)   . Hard of hearing   . IBS (irritable bowel syndrome) 1986   Past Surgical History:  Procedure Laterality Date  . ABDOMINAL HYSTERECTOMY    . CHOLECYSTECTOMY    . ERCP  04/20/2011   Procedure: ENDOSCOPIC RETROGRADE CHOLANGIOPANCREATOGRAPHY (ERCP);  Surgeon: Inda Castle, MD;  Location: [facility];  Service: Endoscopy;  Laterality: N/A;  case is at 1430 in or  . ERCP  04/20/2011   Procedure: ENDOSCOPIC RETROGRADE CHOLANGIOPANCREATOGRAPHY (ERCP);  Surgeon: Inda Castle, MD;  Location: [facility];  Service: Gastroenterology;  Laterality: N/A;   Current Outpatient Medications on File Prior to Visit  Medication Sig Dispense Refill  . ACCU-CHEK SOFTCLIX LANCETS lancets Check BS QAM - DX: E11.9 200 each 3  . atorvastatin (LIPITOR) 40 MG tablet TAKE 1 TABLET BY MOUTH DAILY. 90 tablet 3  . Blood Glucose Monitoring Suppl (ACCU-CHEK AVIVA PLUS) w/Device KIT Check BS qam DX: e11.9 1 kit 0  . cholecalciferol (VITAMIN D) 1000 UNITS tablet Take 1,000 Units by mouth daily.    . Cinnamon 500 MG TABS Take by mouth.    . fluticasone (FLONASE) 50 MCG/ACT nasal spray Place 2 sprays into both nostrils daily. 16 g 6  . glucose blood (ACCU-CHEK AVIVA PLUS) test strip Check BS QAM - DX: E11.9 100 each 6  . HYDROcodone-acetaminophen (NORCO) 5-325 MG tablet Take 1 tablet by mouth every 6 (six) hours as needed for moderate pain. 30 tablet 0  . Lancets (ACCU-CHEK MULTICLIX) lancets Check BS QAM - DX: E11.9 100 each 12  . losartan (COZAAR) 50 MG tablet Take 1 tablet (50 mg total) by mouth daily. 90 tablet 3  . Magnesium 500 MG TABS Take 500 mg by mouth daily.    . metFORMIN (GLUCOPHAGE) 1000 MG tablet TAKE 1 TABLET TWICE DAILY  WITH  MEALS 180 tablet 3  . mometasone (ELOCON) 0.1 % cream Apply 1 application topically daily. 45 g 0  . pantoprazole (PROTONIX) 40 MG tablet TAKE 1 TABLET BY MOUTH TWICE DAILY 180 tablet 3  . pioglitazone (ACTOS) 30 MG  tablet Take 1 tablet (30 mg total) by mouth daily. 90 tablet 3  . triamcinolone cream (KENALOG) 0.1 % Apply 1 application topically 2 (two) times daily. 30 g 0  . vitamin E 100 UNIT capsule Take 100 Units by mouth daily.     No current facility-administered medications on file prior to visit.    Allergies  Allergen Reactions  . Contrast Media [Iodinated Diagnostic Agents] Nausea And Vomiting  . Penicillins Hives and Itching   Social History   Socioeconomic History  . Marital status: Widowed    Spouse name: Not on file  . Number of children: 4  . Years of education: Not on file  . Highest education level: Not on file  Occupational History  . Occupation: Unemployed  Social Needs  . Financial resource strain: Not on file  . Food insecurity:    Worry: Not on file    Inability: Not on file  . Transportation needs:    Medical: Not on file    Non-medical: Not on file  Tobacco Use  . Smoking status: Current Every Day Smoker    Packs/day: 1.00    Years: 40.00    Pack years: 40.00    Types: Cigarettes  . Smokeless tobacco: Never Used  Substance and Sexual Activity  . Alcohol use: No  . Drug use: No  . Sexual activity: Not on file  Lifestyle  . Physical activity:    Days per week: Not on file    Minutes per session: Not on file  . Stress: Not on file  Relationships  . Social connections:    Talks on phone: Not on file    Gets together: Not on file    Attends religious service: Not on file  Active member of club or organization: Not on file    Attends meetings of clubs or organizations: Not on file    Relationship status: Not on file  . Intimate partner violence:    Fear of current or ex partner: Not on file    Emotionally abused: Not on file    Physically abused: Not on file    Forced sexual activity: Not on file  Other Topics Concern  . Not on file  Social History Narrative  . Not on file     Review of Systems  All other systems reviewed and are  negative.      Objective:   Physical Exam  Constitutional: She is oriented to person, place, and time. She appears well-developed and well-nourished. No distress.  Eyes: Conjunctivae are normal. No scleral icterus.  Neck: Neck supple. No thyromegaly present.  Cardiovascular: Normal rate, regular rhythm and intact distal pulses.  No murmur heard. Pulmonary/Chest: Effort normal and breath sounds normal. No respiratory distress. She has no wheezes. She has no rales. She exhibits no tenderness.  Abdominal: Soft. Bowel sounds are normal. She exhibits no distension. There is no tenderness. There is no rebound and no guarding.  Musculoskeletal: She exhibits no edema.  Neurological: She is alert and oriented to person, place, and time. She has normal reflexes. No cranial nerve deficit.  Skin: She is not diaphoretic.  Vitals reviewed.         Assessment & Plan:  Need for vaccination - Plan: Flu Vaccine QUAD 36+ mos IM  Postmenopausal estrogen deficiency - Plan: DG Bone Density  Chronic midline low back pain without sciatica  Controlled type 2 diabetes mellitus with complication, without long-term current use of insulin (HCC)  Pure hypercholesterolemia  Benign essential HTN  Fatty liver disease, nonalcoholic  Patient refuses a colonoscopy.  She refuses a mammogram.  She consents today for a bone density test which I will schedule for her.  She is due for a flu shot as well as her second pneumonia vaccine she states that she will return for these next week.  She has something she has to do this morning and she does not want a vaccine prior to that.  She is asking for refill on hydrocodone which she uses 1-2 a day for low back pain.  There is no evidence of abuse or diversion I will gladly refill this for her.  I am very happy with her diabetic control.  Her hemoglobin A1c is excellent.  I am also very happy with her LDL cholesterol.  It is well below 100.  Her blood pressure today is  excellent at 118/70.  She already has her diabetic eye exam scheduled and her diabetic foot exam is normal.  My only concern is her elevated liver function test which I believe a reflection of fatty liver disease.  I recommended 10 to 15 pounds weight loss over the next 6 months and then recheck liver test in 6 months.

## 2017-12-18 ENCOUNTER — Other Ambulatory Visit: Payer: Self-pay | Admitting: Family Medicine

## 2018-01-26 ENCOUNTER — Other Ambulatory Visit: Payer: Medicare HMO

## 2018-02-02 ENCOUNTER — Telehealth: Payer: Self-pay | Admitting: Family Medicine

## 2018-02-02 ENCOUNTER — Encounter: Payer: Self-pay | Admitting: Family Medicine

## 2018-02-02 ENCOUNTER — Ambulatory Visit (INDEPENDENT_AMBULATORY_CARE_PROVIDER_SITE_OTHER): Payer: Medicare HMO | Admitting: Family Medicine

## 2018-02-02 VITALS — BP 110/70 | HR 91 | Temp 97.8°F | Resp 15 | Ht 63.0 in | Wt 156.4 lb

## 2018-02-02 DIAGNOSIS — R42 Dizziness and giddiness: Secondary | ICD-10-CM | POA: Diagnosis not present

## 2018-02-02 DIAGNOSIS — J0141 Acute recurrent pansinusitis: Secondary | ICD-10-CM

## 2018-02-02 DIAGNOSIS — H6093 Unspecified otitis externa, bilateral: Secondary | ICD-10-CM

## 2018-02-02 MED ORDER — DOXYCYCLINE HYCLATE 100 MG PO TABS: 100.0000 mg | ORAL_TABLET | Freq: Two times a day (BID) | ORAL | 0 refills | Status: AC

## 2018-02-02 MED ORDER — MOMETASONE FUROATE 50 MCG/ACT NA SUSP: 2.0000 | Freq: Every day | NASAL | 1 refills | Status: DC

## 2018-02-02 MED ORDER — OFLOXACIN 0.3 % OP SOLN: 5.0000 [drp] | Freq: Every day | OPHTHALMIC | 0 refills | Status: AC

## 2018-02-02 MED ORDER — MECLIZINE HCL 12.5 MG PO TABS: 12.5000 mg | ORAL_TABLET | Freq: Three times a day (TID) | ORAL | 0 refills | Status: DC | PRN

## 2018-02-02 MED ORDER — NEOMYCIN-POLYMYXIN-HC 3.5-10000-1 OT SOLN: 4.0000 [drp] | Freq: Four times a day (QID) | OTIC | 0 refills | Status: AC

## 2018-02-02 NOTE — Patient Instructions (Addendum)
Start a steroid nasal spray daily - 2 sprays each nostril and you can continue your other management (saline, etc) (ask your pharmacist for most affordable option for steroid nasal spray like flonase, nasonex, etc.)  Do the ear drops to both ears for the next week, and then afterwards can use as needed for ear irritation, drainage, swelling  Start the antibiotics for sinus infection.  Meclizine trial to use as needed for dizziness/vertigo.  Take at home in the evening first because it can make you sleepy.  Do not drive or operate heavy machinery while taking.   Follow up if not feeling better in 2 weeks.

## 2018-02-02 NOTE — Telephone Encounter (Signed)
Spoke with patient and informed het hat Leisa sent over ofloxacin drops. Advised patient if the drops are too expensive this time she can still pick up the oral antibiotic and she can come pick up a printed prescription for the drops and shop around. Patient verbalized understanding.

## 2018-02-02 NOTE — Telephone Encounter (Signed)
Patient called in stating that she went to pick up the Cortisporin drops and the cost was $47 and she could not afford that. She wanted to know if you could call in a cheaper drop. Please advise?

## 2018-02-02 NOTE — Progress Notes (Signed)
Patient ID: Rhonda Fields, female    DOB: 1950-04-26, 68 y.o.   MRN: [id number]  PCP: Susy Frizzle, MD  Chief Complaint  Patient presents with  . Ear Pain    Patient in today with c/o bilateral ear pain, with foul smelling drainage. Also has c/o generalized fatigue    Subjective:   Rhonda Fields is a 68 y.o. female, presents to clinic with CC of bilateral ear pain, right hurting more the left with drainage she will dripping out of her right ear had occurred at night was malodorous.  Is tenderness to her outer ear does feel blocked.  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  Also experiencing dizziness episodes a last a few seconds to less than 1 minute occur with waking up and moving around in her bed for second the morning or doing crafts and sewing with head movements, the room is spinning.  She also reports history of chronic sinus congestion and pressure that is much worse today she is very tender in her entire face including both of her cheeks behind her eyes and across her forehead and she feels generally ill.  She denies any fever, sweats, chills, nausea, vomiting, cough, shortness of breath, weight loss, decreased appetite.   Patient Active Problem List   Diagnosis Date Noted  . Type II or unspecified type diabetes mellitus without mention of complication, uncontrolled 01/30/2013  . Conjunctivitis 10/31/2012  . Hard of hearing   . Generalized abdominal pain 04/29/2011  . Nausea 04/29/2011  . Abdominal pain, epigastric 04/20/2011  . Nonspecific (abnormal) findings on radiological and other examination of biliary tract 04/20/2011  . Calculus of bile duct without mention of cholecystitis or obstruction 04/20/2011  . History of total hysterectomy with removal of both tubes and ovaries 04/08/2011  . Generalized headaches 04/08/2011     Prior to Admission medications   Medication  Sig Start Date End Date Taking? Authorizing Provider  ACCU-CHEK SOFTCLIX LANCETS lancets Check BS QAM - DX: E11.9 11/19/17  Yes Susy Frizzle, MD  atorvastatin (LIPITOR) 40 MG tablet TAKE 1 TABLET BY MOUTH DAILY. 07/27/17  Yes Susy Frizzle, MD  Blood Glucose Monitoring Suppl (ACCU-CHEK AVIVA PLUS) w/Device KIT Check BS qam DX: e11.9 08/20/17  Yes Susy Frizzle, MD  cholecalciferol (VITAMIN D) 1000 UNITS tablet Take 1,000 Units by mouth daily.   Yes [provider]  Cinnamon 500 MG TABS Take by mouth.   Yes [provider]  fluticasone (FLONASE) 50 MCG/ACT nasal spray Place 2 sprays into both nostrils daily. 01/01/14  Yes Susy Frizzle, MD  glucose blood (ACCU-CHEK AVIVA PLUS) test strip Check BS QAM - DX: E11.9 11/19/17  Yes Susy Frizzle, MD  HYDROcodone-acetaminophen (NORCO) 5-325 MG tablet Take 1 tablet by mouth every 6 (six) hours as needed for moderate pain. 11/19/17  Yes Susy Frizzle, MD  Lancets (ACCU-CHEK MULTICLIX) lancets Check BS QAM - DX: E11.9 11/19/17  Yes Susy Frizzle, MD  losartan (COZAAR) 50 MG tablet TAKE 1 TABLET EVERY DAY 12/20/17  Yes Susy Frizzle, MD  Magnesium 500 MG TABS Take 500 mg by mouth daily.   Yes [provider]  metFORMIN (GLUCOPHAGE) 1000 MG tablet TAKE 1 TABLET TWICE DAILY  WITH  MEALS 07/27/17  Yes Pickard, Cammie Mcgee, MD  mometasone (ELOCON) 0.1 % cream Apply 1 application topically daily. 06/09/16  Yes Susy Frizzle,  MD  pantoprazole (PROTONIX) 40 MG tablet TAKE 1 TABLET BY MOUTH TWICE DAILY 07/27/17  Yes Susy Frizzle, MD  pioglitazone (ACTOS) 30 MG tablet Take 1 tablet (30 mg total) by mouth daily. 10/23/16  Yes Susy Frizzle, MD  triamcinolone cream (KENALOG) 0.1 % Apply 1 application topically 2 (two) times daily. 01/27/13  Yes Susy Frizzle, MD  vitamin E 100 UNIT capsule Take 100 Units by mouth daily.   Yes [provider]     Allergies  Allergen Reactions  . Contrast Media  [Iodinated Diagnostic Agents] Nausea And Vomiting  . Penicillins Hives and Itching     Family History  Adopted: Yes  Problem Relation Age of Onset  . Diabetes Unknown        Siblings, both sets of grandparents  . Liver disease Mother   . Kidney disease Mother   . Liver disease Brother   . Malignant hyperthermia Neg Hx      Social History   Socioeconomic History  . Marital status: Widowed    Spouse name: Not on file  . Number of children: 4  . Years of education: Not on file  . Highest education level: Not on file  Occupational History  . Occupation: Unemployed  Social Needs  . Financial resource strain: Not on file  . Food insecurity:    Worry: Not on file    Inability: Not on file  . Transportation needs:    Medical: Not on file    Non-medical: Not on file  Tobacco Use  . Smoking status: Current Every Day Smoker    Packs/day: 1.00    Years: 40.00    Pack years: 40.00    Types: Cigarettes  . Smokeless tobacco: Never Used  Substance and Sexual Activity  . Alcohol use: No  . Drug use: No  . Sexual activity: Not on file  Lifestyle  . Physical activity:    Days per week: Not on file    Minutes per session: Not on file  . Stress: Not on file  Relationships  . Social connections:    Talks on phone: Not on file    Gets together: Not on file    Attends religious service: Not on file    Active member of club or organization: Not on file    Attends meetings of clubs or organizations: Not on file    Relationship status: Not on file  . Intimate partner violence:    Fear of current or ex partner: Not on file    Emotionally abused: Not on file    Physically abused: Not on file    Forced sexual activity: Not on file  Other Topics Concern  . Not on file  Social History Narrative  . Not on file     Review of Systems     Objective:    Vitals:   02/02/18 0924  BP: 110/70  Pulse: 91  Resp: 15  Temp: 97.8 F (36.6 C)  TempSrc: Oral  SpO2: 98%  Weight:  156 lb 6 oz (70.9 kg)  Height: _0  (1.6 m)      Physical Exam Vitals signs and nursing note reviewed.  Constitutional:      General: She is not in acute distress.    Appearance: She is well-developed. She is obese. She is not ill-appearing, toxic-appearing or diaphoretic.  HENT:     Head: Normocephalic and atraumatic. No raccoon eyes or Battle's sign.     Jaw: There is normal  jaw occlusion. No trismus, swelling, pain on movement or malocclusion.     Salivary Glands: Right salivary gland is not diffusely enlarged or tender. Left salivary gland is not diffusely enlarged or tender.     Right Ear: Tympanic membrane normal. Decreased hearing noted. Swelling and tenderness present. No drainage. No middle ear effusion. No mastoid tenderness.     Left Ear: Tympanic membrane normal. Decreased hearing noted. Swelling and tenderness present.  No middle ear effusion. No mastoid tenderness.     Nose: Nasal tenderness, mucosal edema, congestion and rhinorrhea present.     Right Nostril: No occlusion.     Left Nostril: No occlusion.     Right Turbinates: Enlarged and swollen.     Right Sinus: Maxillary sinus tenderness and frontal sinus tenderness present.     Left Sinus: Maxillary sinus tenderness and frontal sinus tenderness present.     Mouth/Throat:     Mouth: Mucous membranes are moist. Mucous membranes are not pale.     Pharynx: Oropharynx is clear. Uvula midline. No oropharyngeal exudate, posterior oropharyngeal erythema or uvula swelling.     Tonsils: No tonsillar exudate or tonsillar abscesses. Swelling: 0 on the right. 0 on the left.  Eyes:     General:        Right eye: No discharge.        Left eye: No discharge.     Conjunctiva/sclera: Conjunctivae normal.     Pupils: Pupils are equal, round, and reactive to light.  Neck:     Musculoskeletal: Normal range of motion and neck supple.     Trachea: No tracheal deviation.  Cardiovascular:     Rate and Rhythm: Normal rate and regular  rhythm.     Pulses: Normal pulses.     Heart sounds: Normal heart sounds. Heart sounds not distant. No murmur. No friction rub. No gallop.   Pulmonary:     Effort: Pulmonary effort is normal. No respiratory distress.     Breath sounds: Normal breath sounds. No stridor, decreased air movement or transmitted upper airway sounds. No decreased breath sounds, wheezing, rhonchi or rales.  Abdominal:     General: Bowel sounds are normal. There is no distension.     Palpations: Abdomen is soft.  Musculoskeletal: Normal range of motion.  Lymphadenopathy:     Head:     Right side of head: Preauricular and posterior auricular adenopathy present.     Left side of head: Preauricular and posterior auricular adenopathy present.     Cervical: No cervical adenopathy.  Skin:    General: Skin is warm and dry.     Coloration: Skin is not pale.     Findings: No rash.  Neurological:     Mental Status: She is alert.     Motor: No abnormal muscle tone.     Coordination: Coordination normal.  Psychiatric:        Behavior: Behavior normal.           Assessment & Plan:      ICD-10-CM   1. Otitis externa of both ears, unspecified chronicity, unspecified type H60.93 neomycin-polymyxin-hydrocortisone (CORTISPORIN) OTIC solution  2. Acute recurrent pansinusitis J01.41 doxycycline (VIBRA-TABS) 100 MG tablet    mometasone (NASONEX) 50 MCG/ACT nasal spray  3. Vertigo R42 meclizine (ANTIVERT) 12.5 MG tablet    68 y/o female with hx of chronic sinusitis presents with ear pain drainage bilaterally R>L x several days and acutely worsening sinus pain and pressure.    B/L ear canals appear  to have some chronic otitis externa, there is mild edema and erythema bilaterally no active drainage, TMs bilaterally appear normal, both ears are fairly tender on exam but right external ear is much more tender with manipulation of pinna or palpation of tragus, will treat with Cortisporin drops.  She does appear to have acute  bacterial sinusitis is very tender on exam to bilateral maxillary and frontal sinuses evidence of edema erythema to nasal mucosa right side worse than left, treat with antibiotic course, doxycycline. Is also having some brief episodes of vertigo which do appear to be BPPV, producible with head movements, meclizine trial.  Follow up if not improving in 2 weeks   Delsa Grana, PA-C 02/02/18 9:53 AM

## 2018-02-02 NOTE — Addendum Note (Signed)
Addended by: Delsa Grana on: 02/02/2018 11:11 AM   Modules accepted: Orders

## 2018-02-14 ENCOUNTER — Other Ambulatory Visit: Payer: Medicare HMO

## 2018-03-01 DIAGNOSIS — H5203 Hypermetropia, bilateral: Secondary | ICD-10-CM | POA: Diagnosis not present

## 2018-03-17 DIAGNOSIS — S92401A Displaced unspecified fracture of right great toe, initial encounter for closed fracture: Secondary | ICD-10-CM | POA: Diagnosis not present

## 2018-06-17 ENCOUNTER — Other Ambulatory Visit: Payer: Self-pay | Admitting: Family Medicine

## 2018-06-17 DIAGNOSIS — E119 Type 2 diabetes mellitus without complications: Secondary | ICD-10-CM

## 2018-06-30 ENCOUNTER — Other Ambulatory Visit: Payer: Self-pay | Admitting: Family Medicine

## 2018-06-30 DIAGNOSIS — E118 Type 2 diabetes mellitus with unspecified complications: Secondary | ICD-10-CM

## 2018-06-30 MED ORDER — PIOGLITAZONE HCL 30 MG PO TABS: 30.0000 mg | ORAL_TABLET | Freq: Every day | ORAL | 3 refills | Status: DC

## 2018-07-27 ENCOUNTER — Other Ambulatory Visit: Payer: Self-pay | Admitting: Family Medicine

## 2018-07-27 DIAGNOSIS — E119 Type 2 diabetes mellitus without complications: Secondary | ICD-10-CM

## 2018-08-01 ENCOUNTER — Other Ambulatory Visit: Payer: Self-pay | Admitting: Family Medicine

## 2018-08-08 ENCOUNTER — Other Ambulatory Visit: Payer: Self-pay

## 2018-08-08 ENCOUNTER — Other Ambulatory Visit: Payer: Medicare HMO

## 2018-08-08 DIAGNOSIS — E7800 Pure hypercholesterolemia, unspecified: Secondary | ICD-10-CM

## 2018-08-08 DIAGNOSIS — E118 Type 2 diabetes mellitus with unspecified complications: Secondary | ICD-10-CM | POA: Diagnosis not present

## 2018-08-08 DIAGNOSIS — I10 Essential (primary) hypertension: Secondary | ICD-10-CM | POA: Diagnosis not present

## 2018-08-09 ENCOUNTER — Encounter: Payer: Self-pay | Admitting: Family Medicine

## 2018-08-09 ENCOUNTER — Ambulatory Visit (INDEPENDENT_AMBULATORY_CARE_PROVIDER_SITE_OTHER): Payer: Medicare HMO | Admitting: Family Medicine

## 2018-08-09 VITALS — BP 120/74 | HR 84 | Temp 98.6°F | Resp 18 | Ht 63.0 in | Wt 154.0 lb

## 2018-08-09 DIAGNOSIS — E118 Type 2 diabetes mellitus with unspecified complications: Secondary | ICD-10-CM | POA: Diagnosis not present

## 2018-08-09 DIAGNOSIS — E7800 Pure hypercholesterolemia, unspecified: Secondary | ICD-10-CM | POA: Diagnosis not present

## 2018-08-09 DIAGNOSIS — I10 Essential (primary) hypertension: Secondary | ICD-10-CM

## 2018-08-09 DIAGNOSIS — M545 Low back pain: Secondary | ICD-10-CM | POA: Diagnosis not present

## 2018-08-09 DIAGNOSIS — G8929 Other chronic pain: Secondary | ICD-10-CM

## 2018-08-09 LAB — CBC WITH DIFFERENTIAL/PLATELET
Absolute Monocytes: 522 cells/uL (ref 200–950)
Basophils Absolute: 54 cells/uL (ref 0–200)
Basophils Relative: 0.6 %
Eosinophils Absolute: 225 cells/uL (ref 15–500)
Eosinophils Relative: 2.5 %
HCT: 38.3 % (ref 35.0–45.0)
Hemoglobin: 12.5 g/dL (ref 11.7–15.5)
Lymphs Abs: 1683 cells/uL (ref 850–3900)
MCH: 30.3 pg (ref 27.0–33.0)
MCHC: 32.6 g/dL (ref 32.0–36.0)
MCV: 93 fL (ref 80.0–100.0)
MPV: 9.5 fL (ref 7.5–12.5)
Monocytes Relative: 5.8 %
Neutro Abs: 6516 cells/uL (ref 1500–7800)
Neutrophils Relative %: 72.4 %
Platelets: 258 10*3/uL (ref 140–400)
RBC: 4.12 10*6/uL (ref 3.80–5.10)
RDW: 13.4 % (ref 11.0–15.0)
Total Lymphocyte: 18.7 %
WBC: 9 10*3/uL (ref 3.8–10.8)

## 2018-08-09 LAB — COMPREHENSIVE METABOLIC PANEL
AG Ratio: 1.3 (calc) (ref 1.0–2.5)
ALT: 33 U/L — ABNORMAL HIGH (ref 6–29)
AST: 33 U/L (ref 10–35)
Albumin: 4 g/dL (ref 3.6–5.1)
Alkaline phosphatase (APISO): 93 U/L (ref 37–153)
BUN: 11 mg/dL (ref 7–25)
CO2: 25 mmol/L (ref 20–32)
Calcium: 9.4 mg/dL (ref 8.6–10.4)
Chloride: 106 mmol/L (ref 98–110)
Creat: 0.75 mg/dL (ref 0.50–0.99)
Globulin: 3 g/dL (calc) (ref 1.9–3.7)
Glucose, Bld: 108 mg/dL — ABNORMAL HIGH (ref 65–99)
Potassium: 4.5 mmol/L (ref 3.5–5.3)
Sodium: 141 mmol/L (ref 135–146)
Total Bilirubin: 0.5 mg/dL (ref 0.2–1.2)
Total Protein: 7 g/dL (ref 6.1–8.1)

## 2018-08-09 LAB — LIPID PANEL
Cholesterol: 126 mg/dL (ref <200)
HDL: 39 mg/dL — ABNORMAL LOW (ref >=50)
LDL Cholesterol (Calc): 58 mg/dL (calc)
Non-HDL Cholesterol (Calc): 87 mg/dL (calc) (ref <130)
Total CHOL/HDL Ratio: 3.2 (calc) (ref <5.0)
Triglycerides: 233 mg/dL — ABNORMAL HIGH (ref <150)

## 2018-08-09 LAB — HEMOGLOBIN A1C
Hgb A1c MFr Bld: 6.3 % of total Hgb — ABNORMAL HIGH (ref <5.7)
Mean Plasma Glucose: 134 (calc)
eAG (mmol/L): 7.4 (calc)

## 2018-08-09 MED ORDER — LOSARTAN POTASSIUM 25 MG PO TABS: 25.0000 mg | ORAL_TABLET | Freq: Every day | ORAL | 2 refills | Status: DC

## 2018-08-09 MED ORDER — HYDROCODONE-ACETAMINOPHEN 5-325 MG PO TABS: 1.0000 | ORAL_TABLET | Freq: Four times a day (QID) | ORAL | 0 refills | Status: DC | PRN

## 2018-08-09 NOTE — Progress Notes (Signed)
Subjective:    Patient ID: Rhonda Fields, female    DOB: Mar 07, 1950, 68 y.o.   MRN: [id number]  Medication Refill  Patient is here today for a checkup regarding her diabetes.  Her blood pressures well controlled.  She denies any chest pain shortness of breath or dyspnea on exertion.  Her weight has remained stable since her last office visit and is 154 pounds today.  Her diabetes test has returned and is well controlled at 6.4.  She denies any polyuria, polydipsia, or blurry vision.  She does occasionally get some numbness in her feet if she sits a certain way for prolonged period of time however she denies any signs of peripheral neuropathy.  Diabetic foot exam was performed today and is completely normal.  She has normal sensation to 10 g monofilament bilaterally and normal pulses in both feet.  She just had her diabetic eye exam performed and this is reportedly normal.  She denies any myalgias or right upper quadrant pain.  Her most recent lab work is listed below: Lab on 08/08/2018  Component Date Value Ref Range Status  . WBC 08/08/2018 9.0  3.8 - 10.8 Thousand/uL Final  . RBC 08/08/2018 4.12  3.80 - 5.10 Million/uL Final  . Hemoglobin 08/08/2018 12.5  11.7 - 15.5 g/dL Final  . HCT 08/08/2018 38.3  35.0 - 45.0 % Final  . MCV 08/08/2018 93.0  80.0 - 100.0 fL Final  . MCH 08/08/2018 30.3  27.0 - 33.0 pg Final  . MCHC 08/08/2018 32.6  32.0 - 36.0 g/dL Final  . RDW 08/08/2018 13.4  11.0 - 15.0 % Final  . Platelets 08/08/2018 258  140 - 400 Thousand/uL Final  . MPV 08/08/2018 9.5  7.5 - 12.5 fL Final  . Neutro Abs 08/08/2018 6,516  1,500 - 7,800 cells/uL Final  . Lymphs Abs 08/08/2018 1,683  850 - 3,900 cells/uL Final  . Absolute Monocytes 08/08/2018 522  200 - 950 cells/uL Final  . Eosinophils Absolute 08/08/2018 225  15 - 500 cells/uL Final  . Basophils Absolute 08/08/2018 54  0 - 200 cells/uL Final  . Neutrophils Relative % 08/08/2018 72.4  % Final  . Total Lymphocyte  08/08/2018 18.7  % Final  . Monocytes Relative 08/08/2018 5.8  % Final  . Eosinophils Relative 08/08/2018 2.5  % Final  . Basophils Relative 08/08/2018 0.6  % Final  . Glucose, Bld 08/08/2018 108* 65 - 99 mg/dL Final   Comment: .            Fasting reference interval . For someone without known diabetes, a glucose value between 100 and 125 mg/dL is consistent with prediabetes and should be confirmed with a follow-up test. .   . BUN 08/08/2018 11  7 - 25 mg/dL Final  . Creat 08/08/2018 0.75  0.50 - 0.99 mg/dL Final   Comment: For patients >21 years of age, the reference limit for Creatinine is approximately 13% higher for people identified as African-American. .   Havery Moros Ratio 40/98/1191 NOT APPLICABLE  6 - 22 (calc) Final  . Sodium 08/08/2018 141  135 - 146 mmol/L Final  . Potassium 08/08/2018 4.5  3.5 - 5.3 mmol/L Final  . Chloride 08/08/2018 106  98 - 110 mmol/L Final  . CO2 08/08/2018 25  20 - 32 mmol/L Final  . Calcium 08/08/2018 9.4  8.6 - 10.4 mg/dL Final  . Total Protein 08/08/2018 7.0  6.1 - 8.1 g/dL Final  . Albumin 08/08/2018 4.0  3.6 -  5.1 g/dL Final  . Globulin 08/08/2018 3.0  1.9 - 3.7 g/dL (calc) Final  . AG Ratio 08/08/2018 1.3  1.0 - 2.5 (calc) Final  . Total Bilirubin 08/08/2018 0.5  0.2 - 1.2 mg/dL Final  . Alkaline phosphatase (APISO) 08/08/2018 93  37 - 153 U/L Final  . AST 08/08/2018 33  10 - 35 U/L Final  . ALT 08/08/2018 33* 6 - 29 U/L Final  . Cholesterol 08/08/2018 126  <200 mg/dL Final  . HDL 08/08/2018 39* > OR = 50 mg/dL Final  . Triglycerides 08/08/2018 233* <150 mg/dL Final   Comment: . If a non-fasting specimen was collected, consider repeat triglyceride testing on a fasting specimen if clinically indicated.  Yates Decamp et al. J. of Clin. Lipidol. 3545;6:256-389. .   . LDL Cholesterol (Calc) 08/08/2018 58  mg/dL (calc) Final   Comment: Reference range: <100 . Desirable range <100 mg/dL for primary prevention;   <70 mg/dL for  patients with CHD or diabetic patients  with > or = 2 CHD risk factors. Marland Kitchen LDL-C is now calculated using the Martin-Hopkins  calculation, which is a validated novel method providing  better accuracy than the Friedewald equation in the  estimation of LDL-C.  Cresenciano Genre et al. Annamaria Helling. 3734;287(68): 2061-2068  (http://education.[facility].com/faq/FAQ164)   . Total CHOL/HDL Ratio 08/08/2018 3.2  <5.0 (calc) Final  . Non-HDL Cholesterol (Calc) 08/08/2018 87  <130 mg/dL (calc) Final   Comment: For patients with diabetes plus 1 major ASCVD risk  factor, treating to a non-HDL-C goal of <100 mg/dL  (LDL-C of <70 mg/dL) is considered a therapeutic  option.   . Hgb A1c MFr Bld 08/08/2018 6.3* <5.7 % of total Hgb Final   Comment: For someone without known diabetes, a hemoglobin  A1c value between 5.7% and 6.4% is consistent with prediabetes and should be confirmed with a  follow-up test. . For someone with known diabetes, a value <7% indicates that their diabetes is well controlled. A1c targets should be individualized based on duration of diabetes, age, comorbid conditions, and other considerations. . This assay result is consistent with an increased risk of diabetes. . Currently, no consensus exists regarding use of hemoglobin A1c for diagnosis of diabetes for children. .   . Mean Plasma Glucose 08/08/2018 134  (calc) Final  . eAG (mmol/L) 08/08/2018 7.4  (calc) Final   She does occasionally have severe low back pain primarily on the lumbar spine just to the right of midline around the level of L4-L5.  She denies any sciatica or leg weakness Past Medical History:  Diagnosis Date  . Anxiety   . Arrhythmia   . Chronic headaches   . Depression   . Diabetes mellitus without complication ([place])   . GERD (gastroesophageal reflux disease)   . Hard of hearing   . IBS (irritable bowel syndrome) 1986   Past Surgical History:  Procedure Laterality Date  . ABDOMINAL HYSTERECTOMY    .  CHOLECYSTECTOMY    . ERCP  04/20/2011   Procedure: ENDOSCOPIC RETROGRADE CHOLANGIOPANCREATOGRAPHY (ERCP);  Surgeon: Inda Castle, MD;  Location: [facility];  Service: Endoscopy;  Laterality: N/A;  case is at 1430 in or  . ERCP  04/20/2011   Procedure: ENDOSCOPIC RETROGRADE CHOLANGIOPANCREATOGRAPHY (ERCP);  Surgeon: Inda Castle, MD;  Location: [facility];  Service: Gastroenterology;  Laterality: N/A;   Current Outpatient Medications on File Prior to Visit  Medication Sig Dispense Refill  . ACCU-CHEK SOFTCLIX LANCETS lancets Check BS QAM - DX: E11.9 200 each  3  . atorvastatin (LIPITOR) 40 MG tablet TAKE 1 TABLET EVERY DAY 90 tablet 0  . Blood Glucose Monitoring Suppl (ACCU-CHEK AVIVA PLUS) w/Device KIT Check BS qam DX: e11.9 1 kit 0  . cholecalciferol (VITAMIN D) 1000 UNITS tablet Take 1,000 Units by mouth daily.    . Cinnamon 500 MG TABS Take by mouth.    . fluticasone (FLONASE) 50 MCG/ACT nasal spray Place 2 sprays into both nostrils daily. 16 g 6  . glucose blood (ACCU-CHEK AVIVA PLUS) test strip Check BS QAM - DX: E11.9 100 each 6  . Lancets (ACCU-CHEK MULTICLIX) lancets Check BS QAM - DX: E11.9 100 each 12  . Magnesium 500 MG TABS Take 500 mg by mouth daily.    . meclizine (ANTIVERT) 12.5 MG tablet Take 1-2 tablets (12.5-25 mg total) by mouth 3 (three) times daily as needed for dizziness. 30 tablet 0  . metFORMIN (GLUCOPHAGE) 1000 MG tablet TAKE 1 TABLET TWICE DAILY  WITH  MEALS 180 tablet 0  . mometasone (ELOCON) 0.1 % cream Apply 1 application topically daily. 45 g 0  . mometasone (NASONEX) 50 MCG/ACT nasal spray Place 2 sprays into the nose daily. 17 g 1  . pantoprazole (PROTONIX) 40 MG tablet TAKE 1 TABLET TWICE DAILY 180 tablet 3  . pioglitazone (ACTOS) 30 MG tablet Take 1 tablet (30 mg total) by mouth daily. 90 tablet 3  . triamcinolone cream (KENALOG) 0.1 % Apply 1 application topically 2 (two) times daily. 30 g 0  . vitamin E 100 UNIT capsule Take 100 Units by mouth daily.      No current facility-administered medications on file prior to visit.    Allergies  Allergen Reactions  . Contrast Media [Iodinated Diagnostic Agents] Nausea And Vomiting  . Penicillins Hives and Itching   Social History   Socioeconomic History  . Marital status: Widowed    Spouse name: Not on file  . Number of children: 4  . Years of education: Not on file  . Highest education level: Not on file  Occupational History  . Occupation: Unemployed  Social Needs  . Financial resource strain: Not on file  . Food insecurity    Worry: Not on file    Inability: Not on file  . Transportation needs    Medical: Not on file    Non-medical: Not on file  Tobacco Use  . Smoking status: Current Every Day Smoker    Packs/day: 1.00    Years: 40.00    Pack years: 40.00    Types: Cigarettes  . Smokeless tobacco: Never Used  Substance and Sexual Activity  . Alcohol use: No  . Drug use: No  . Sexual activity: Not on file  Lifestyle  . Physical activity    Days per week: Not on file    Minutes per session: Not on file  . Stress: Not on file  Relationships  . Social Herbalist on phone: Not on file    Gets together: Not on file    Attends religious service: Not on file    Active member of club or organization: Not on file    Attends meetings of clubs or organizations: Not on file    Relationship status: Not on file  . Intimate partner violence    Fear of current or ex partner: Not on file    Emotionally abused: Not on file    Physically abused: Not on file    Forced sexual activity: Not on file  Other Topics Concern  . Not on file  Social History Narrative  . Not on file     Review of Systems  All other systems reviewed and are negative.      Objective:   Physical Exam  Cardiovascular: Normal rate, regular rhythm, normal heart sounds and intact distal pulses.  Pulmonary/Chest: Effort normal and breath sounds normal. No respiratory distress. She has no wheezes.  She has no rales. She exhibits no tenderness.  Abdominal: Soft. Bowel sounds are normal. She exhibits no distension. There is no abdominal tenderness. There is no rebound and no guarding.  Musculoskeletal:        General: No edema.  Vitals reviewed.         Assessment & Plan:   The primary encounter diagnosis was Controlled type 2 diabetes mellitus with complication, without long-term current use of insulin ([place]). Diagnoses of Pure hypercholesterolemia, Benign essential HTN, and Chronic midline low back pain without sciatica were also pertinent to this visit. Diabetes is well controlled.  I will make no changes in her medication at this time.  Diabetic eye exam and foot exam are normal.  LDL cholesterol is well below goal of 100.  Blood pressures well controlled today.  Regular anticipatory guidance is provided.  Patient did request a refill on her hydrocodone/acetaminophen that she uses sparingly for low back pain.  X-ray did confirm mild anterior listhesis at L4-L5 and degenerative disc disease.  I am comfortable with her using the medication sparingly as she tends to take only a few pills each month when ibuprofen does not relieve her back pain.  I have no concern over abuse or diversion.  I did refill her hydrocodone which she takes sparingly for low back pain

## 2018-11-11 ENCOUNTER — Other Ambulatory Visit: Payer: Self-pay | Admitting: Family Medicine

## 2018-11-11 DIAGNOSIS — E119 Type 2 diabetes mellitus without complications: Secondary | ICD-10-CM

## 2018-12-10 ENCOUNTER — Other Ambulatory Visit: Payer: Self-pay | Admitting: Family Medicine

## 2019-01-26 ENCOUNTER — Other Ambulatory Visit: Payer: Self-pay | Admitting: Family Medicine

## 2019-01-26 DIAGNOSIS — E119 Type 2 diabetes mellitus without complications: Secondary | ICD-10-CM

## 2019-04-05 ENCOUNTER — Other Ambulatory Visit: Payer: Self-pay | Admitting: Family Medicine

## 2019-04-05 DIAGNOSIS — E119 Type 2 diabetes mellitus without complications: Secondary | ICD-10-CM

## 2019-04-06 ENCOUNTER — Other Ambulatory Visit: Payer: Self-pay | Admitting: Family Medicine

## 2019-04-06 DIAGNOSIS — E118 Type 2 diabetes mellitus with unspecified complications: Secondary | ICD-10-CM

## 2019-04-06 MED ORDER — ATORVASTATIN CALCIUM 40 MG PO TABS: 40.0000 mg | ORAL_TABLET | Freq: Every day | ORAL | 0 refills | Status: DC

## 2019-04-06 NOTE — Telephone Encounter (Signed)
Copied from [place] [id number]. Topic: Quick Communication - Rx Refill/Question >> Apr 06, 2019  2:12 PM Izola Price, [place] A wrote: Medication:atorvastatin (LIPITOR) 40 MG tablet ,pantoprazole (PROTONIX) 40 MG tablet,pioglitazone (ACTOS) 30 MG tablet  Has the patient contacted their pharmacy? Yes (Agent: If no, request that the patient contact the pharmacy for the refill.) (Agent: If yes, when and what did the pharmacy advise?)Contact PCP  Preferred Pharmacy (with phone number or street name): [facility], [place]  Phone:  [phone] Fax:  [phone]     Agent: Please be advised that RX refills may take up to 3 business days. We ask that you follow-up with your pharmacy.

## 2019-04-28 ENCOUNTER — Other Ambulatory Visit: Payer: Self-pay | Admitting: Family Medicine

## 2019-04-28 DIAGNOSIS — E118 Type 2 diabetes mellitus with unspecified complications: Secondary | ICD-10-CM

## 2019-05-11 ENCOUNTER — Other Ambulatory Visit: Payer: Self-pay | Admitting: Family Medicine

## 2019-05-21 ENCOUNTER — Other Ambulatory Visit: Payer: Self-pay | Admitting: Family Medicine

## 2019-08-10 ENCOUNTER — Other Ambulatory Visit: Payer: Self-pay

## 2019-08-10 ENCOUNTER — Ambulatory Visit (INDEPENDENT_AMBULATORY_CARE_PROVIDER_SITE_OTHER): Payer: Medicare Other | Admitting: Family Medicine

## 2019-08-10 VITALS — BP 124/90 | HR 92 | Temp 98.1°F | Wt 159.0 lb

## 2019-08-10 DIAGNOSIS — E118 Type 2 diabetes mellitus with unspecified complications: Secondary | ICD-10-CM | POA: Diagnosis not present

## 2019-08-10 DIAGNOSIS — G8929 Other chronic pain: Secondary | ICD-10-CM

## 2019-08-10 DIAGNOSIS — M545 Low back pain, unspecified: Secondary | ICD-10-CM

## 2019-08-10 DIAGNOSIS — Z23 Encounter for immunization: Secondary | ICD-10-CM

## 2019-08-10 DIAGNOSIS — Z780 Asymptomatic menopausal state: Secondary | ICD-10-CM

## 2019-08-10 DIAGNOSIS — I10 Essential (primary) hypertension: Secondary | ICD-10-CM | POA: Diagnosis not present

## 2019-08-10 DIAGNOSIS — E7800 Pure hypercholesterolemia, unspecified: Secondary | ICD-10-CM | POA: Diagnosis not present

## 2019-08-10 MED ORDER — SOLIFENACIN SUCCINATE 10 MG PO TABS: 10.0000 mg | ORAL_TABLET | Freq: Every day | ORAL | 2 refills | Status: DC

## 2019-08-10 MED ORDER — HYDROCODONE-ACETAMINOPHEN 5-325 MG PO TABS: 1.0000 | ORAL_TABLET | Freq: Four times a day (QID) | ORAL | 0 refills | Status: DC | PRN

## 2019-08-10 NOTE — Progress Notes (Signed)
Subjective:    Patient ID: Rhonda Fields, female    DOB: June 23, 1950, 69 y.o.   MRN: [id number]  Medication Refill  07/2018 Patient is here today for a checkup regarding her diabetes.  Her blood pressures well controlled.  She denies any chest pain shortness of breath or dyspnea on exertion.  Her weight has remained stable since her last office visit and is 154 pounds today.  Her diabetes test has returned and is well controlled at 6.4.  She denies any polyuria, polydipsia, or blurry vision.  She does occasionally get some numbness in her feet if she sits a certain way for prolonged period of time however she denies any signs of peripheral neuropathy.  Diabetic foot exam was performed today and is completely normal.  She has normal sensation to 10 g monofilament bilaterally and normal pulses in both feet.  She just had her diabetic eye exam performed and this is reportedly normal.  She denies any myalgias or right upper quadrant pain.  Her most recent lab work is listed below:  She does occasionally have severe low back pain primarily on the lumbar spine just to the right of midline around the level of L4-L5.  She denies any sciatica or leg weakness.  At that time, my plan was: Diabetes is well controlled.  I will make no changes in her medication at this time.  Diabetic eye exam and foot exam are normal.  LDL cholesterol is well below goal of 100.  Blood pressures well controlled today.  Regular anticipatory guidance is provided.  Patient did request a refill on her hydrocodone/acetaminophen that she uses sparingly for low back pain.  X-ray did confirm mild anterior listhesis at L4-L5 and degenerative disc disease.  I am comfortable with her using the medication sparingly as she tends to take only a few pills each month when ibuprofen does not relieve her back pain.  I have no concern over abuse or diversion.  I did refill her hydrocodone which she takes sparingly for low back  pain  08/10/19 Patient has run out of her Lipitor and Actos for the last 2 weeks.  However she has been taking her other medications regularly.  She denies any polydipsia.  She denies any blurry vision.  She denies any neuropathy in her feet.  Diabetic foot exam was performed today and is normal.  She denies any chest pain shortness of breath or dyspnea on exertion.  Blood pressure is well controlled at 124/90.  She does have symptoms of overactive bladder.  She states that she has urge incontinence.  She also has stress incontinence.  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  She would be interested in trying medication to help with urinary incontinence.  She continues to use the back pain medication very sparingly and would like a refill on this.  She is not overusing the medication and only takes it when she has extreme pain.  She adamantly refuses a mammogram or colonoscopy.  She will consent to a bone density test.  She will also consent to Pneumovax 23.  She has had her Covid vaccination Past Medical History:  Diagnosis Date  . Anxiety   . Arrhythmia   . Chronic headaches   . Depression   . Diabetes mellitus without complication ([place])   . GERD (gastroesophageal reflux disease)   . Hard of hearing   . IBS (irritable bowel syndrome) 1986   Past Surgical History:  Procedure  Laterality Date  . ABDOMINAL HYSTERECTOMY    . CHOLECYSTECTOMY    . ERCP  04/20/2011   Procedure: ENDOSCOPIC RETROGRADE CHOLANGIOPANCREATOGRAPHY (ERCP);  Surgeon: Inda Castle, MD;  Location: [facility];  Service: Endoscopy;  Laterality: N/A;  case is at 1430 in or  . ERCP  04/20/2011   Procedure: ENDOSCOPIC RETROGRADE CHOLANGIOPANCREATOGRAPHY (ERCP);  Surgeon: Inda Castle, MD;  Location: [facility];  Service: Gastroenterology;  Laterality: N/A;   Current Outpatient Medications on File Prior to Visit  Medication Sig Dispense Refill  . ACCU-CHEK AVIVA PLUS test strip TEST BLOOD  SUGAR EVERY MORNING 100 strip 1  . Accu-Chek Softclix Lancets lancets TEST BLOOD SUGAR EVERY MORNING 100 each 1  . atorvastatin (LIPITOR) 40 MG tablet Take 1 tablet (40 mg total) by mouth daily. 90 tablet 0  . Blood Glucose Monitoring Suppl (ACCU-CHEK AVIVA PLUS) w/Device KIT Check BS qam DX: e11.9 1 kit 0  . cholecalciferol (VITAMIN D) 1000 UNITS tablet Take 1,000 Units by mouth daily.    . Cinnamon 500 MG TABS Take by mouth.    . fluticasone (FLONASE) 50 MCG/ACT nasal spray Place 2 sprays into both nostrils daily. 16 g 6  . HYDROcodone-acetaminophen (NORCO) 5-325 MG tablet Take 1 tablet by mouth every 6 (six) hours as needed for moderate pain. 30 tablet 0  . Lancets (ACCU-CHEK MULTICLIX) lancets Check BS QAM - DX: E11.9 100 each 12  . losartan (COZAAR) 25 MG tablet TAKE 1 TABLET EVERY DAY (DISCONTINUE LOSARTAN 50 MG) 90 tablet 2  . Magnesium 500 MG TABS Take 500 mg by mouth daily.    . meclizine (ANTIVERT) 12.5 MG tablet Take 1-2 tablets (12.5-25 mg total) by mouth 3 (three) times daily as needed for dizziness. 30 tablet 0  . metFORMIN (GLUCOPHAGE) 1000 MG tablet TAKE 1 TABLET TWICE DAILY  WITH  MEALS 180 tablet 0  . mometasone (ELOCON) 0.1 % cream Apply 1 application topically daily. 45 g 0  . mometasone (NASONEX) 50 MCG/ACT nasal spray Place 2 sprays into the nose daily. 17 g 1  . pantoprazole (PROTONIX) 40 MG tablet TAKE 1 TABLET TWICE DAILY 180 tablet 3  . pioglitazone (ACTOS) 30 MG tablet TAKE 1 TABLET EVERY DAY 90 tablet 3  . triamcinolone cream (KENALOG) 0.1 % Apply 1 application topically 2 (two) times daily. 30 g 0  . vitamin E 100 UNIT capsule Take 100 Units by mouth daily.     No current facility-administered medications on file prior to visit.   Allergies  Allergen Reactions  . Contrast Media [Iodinated Diagnostic Agents] Nausea And Vomiting  . Penicillins Hives and Itching   Social History   Socioeconomic History  . Marital status: Widowed    Spouse name: Not on file  .  Number of children: 4  . Years of education: Not on file  . Highest education level: Not on file  Occupational History  . Occupation: Unemployed  Tobacco Use  . Smoking status: Current Every Day Smoker    Packs/day: 1.00    Years: 40.00    Pack years: 40.00    Types: Cigarettes  . Smokeless tobacco: Never Used  Substance and Sexual Activity  . Alcohol use: No  . Drug use: No  . Sexual activity: Not on file  Other Topics Concern  . Not on file  Social History Narrative  . Not on file   Social Determinants of Health   Financial Resource Strain:   . Difficulty of Paying Living Expenses:  Food Insecurity:   . Worried About Charity fundraiser in the Last Year:   . Arboriculturist in the Last Year:   Transportation Needs:   . Film/video editor (Medical):   Marland Kitchen Lack of Transportation (Non-Medical):   Physical Activity:   . Days of Exercise per Week:   . Minutes of Exercise per Session:   Stress:   . Feeling of Stress :   Social Connections:   . Frequency of Communication with Friends and Family:   . Frequency of Social Gatherings with Friends and Family:   . Attends Religious Services:   . Active Member of Clubs or Organizations:   . Attends Archivist Meetings:   Marland Kitchen Marital Status:   Intimate Partner Violence:   . Fear of Current or Ex-Partner:   . Emotionally Abused:   Marland Kitchen Physically Abused:   . Sexually Abused:      Review of Systems  All other systems reviewed and are negative.      Objective:   Physical Exam Vitals reviewed.  Cardiovascular:     Rate and Rhythm: Normal rate and regular rhythm.     Heart sounds: Normal heart sounds.  Pulmonary:     Effort: Pulmonary effort is normal. No respiratory distress.     Breath sounds: Normal breath sounds. No wheezing or rales.  Chest:     Chest wall: No tenderness.  Abdominal:     General: Bowel sounds are normal. There is no distension.     Palpations: Abdomen is soft.     Tenderness: There is  no abdominal tenderness. There is no guarding or rebound.           Assessment & Plan:   Controlled type 2 diabetes mellitus with complication, without long-term current use of insulin ([facility]) - Plan: Hemoglobin A1c, CBC with Differential/Platelet, COMPLETE METABOLIC PANEL WITH GFR, Lipid panel, Microalbumin, urine  Pure hypercholesterolemia  Benign essential HTN  Chronic midline low back pain without sciatica  Postmenopausal estrogen deficiency - Plan: DG Bone Density  Strongly recommended Pneumovax 23.  Patient agrees and receive that today.  Also recommended a mammogram and a colonoscopy however the patient politely declines this.  She has had her Covid vaccination.  She will consent to a bone density test.  I will refill her pain medication that she uses sparingly for her low back pain.  Her blood pressure today is well controlled.  I will check a CBC, CMP, fasting lipid panel, and a hemoglobin A1c.  We will try Vesicare 10 mg a day for urge incontinence.  However I explained that this will likely not help with stress incontinence.  She is already performing Kegel exercises to try to help with that.

## 2019-08-10 NOTE — Addendum Note (Signed)
Addended by: Saundra Shelling on: 08/10/2019 09:38 AM   Modules accepted: Orders

## 2019-08-10 NOTE — Addendum Note (Signed)
Addended by: Saundra Shelling on: 08/10/2019 09:51 AM   Modules accepted: Orders

## 2019-08-11 ENCOUNTER — Other Ambulatory Visit: Payer: Self-pay

## 2019-08-11 DIAGNOSIS — J019 Acute sinusitis, unspecified: Secondary | ICD-10-CM

## 2019-08-11 LAB — CBC WITH DIFFERENTIAL/PLATELET
Absolute Monocytes: 505 cells/uL (ref 200–950)
Basophils Absolute: 52 cells/uL (ref 0–200)
Basophils Relative: 0.6 %
Eosinophils Absolute: 191 cells/uL (ref 15–500)
Eosinophils Relative: 2.2 %
HCT: 42.4 % (ref 35.0–45.0)
Hemoglobin: 13.7 g/dL (ref 11.7–15.5)
Lymphs Abs: 1801 cells/uL (ref 850–3900)
MCH: 29.3 pg (ref 27.0–33.0)
MCHC: 32.3 g/dL (ref 32.0–36.0)
MCV: 90.6 fL (ref 80.0–100.0)
MPV: 9.8 fL (ref 7.5–12.5)
Monocytes Relative: 5.8 %
Neutro Abs: 6151 cells/uL (ref 1500–7800)
Neutrophils Relative %: 70.7 %
Platelets: 312 10*3/uL (ref 140–400)
RBC: 4.68 10*6/uL (ref 3.80–5.10)
RDW: 13.3 % (ref 11.0–15.0)
Total Lymphocyte: 20.7 %
WBC: 8.7 10*3/uL (ref 3.8–10.8)

## 2019-08-11 LAB — COMPLETE METABOLIC PANEL WITH GFR
AG Ratio: 1.2 (calc) (ref 1.0–2.5)
ALT: 57 U/L — ABNORMAL HIGH (ref 6–29)
AST: 55 U/L — ABNORMAL HIGH (ref 10–35)
Albumin: 4.1 g/dL (ref 3.6–5.1)
Alkaline phosphatase (APISO): 96 U/L (ref 37–153)
BUN: 9 mg/dL (ref 7–25)
CO2: 25 mmol/L (ref 20–32)
Calcium: 10.1 mg/dL (ref 8.6–10.4)
Chloride: 102 mmol/L (ref 98–110)
Creat: 0.85 mg/dL (ref 0.50–0.99)
GFR, Est African American: 82 mL/min/{1.73_m2} (ref >=60)
GFR, Est Non African American: 71 mL/min/{1.73_m2} (ref >=60)
Globulin: 3.4 g/dL (calc) (ref 1.9–3.7)
Glucose, Bld: 179 mg/dL — ABNORMAL HIGH (ref 65–99)
Potassium: 4.8 mmol/L (ref 3.5–5.3)
Sodium: 138 mmol/L (ref 135–146)
Total Bilirubin: 0.6 mg/dL (ref 0.2–1.2)
Total Protein: 7.5 g/dL (ref 6.1–8.1)

## 2019-08-11 LAB — HEMOGLOBIN A1C
Hgb A1c MFr Bld: 7.6 % of total Hgb — ABNORMAL HIGH (ref <5.7)
Mean Plasma Glucose: 171 (calc)
eAG (mmol/L): 9.5 (calc)

## 2019-08-11 LAB — LIPID PANEL
Cholesterol: 297 mg/dL — ABNORMAL HIGH (ref <200)
HDL: 42 mg/dL — ABNORMAL LOW (ref >=50)
Non-HDL Cholesterol (Calc): 255 mg/dL (calc) — ABNORMAL HIGH (ref <130)
Total CHOL/HDL Ratio: 7.1 (calc) — ABNORMAL HIGH (ref <5.0)
Triglycerides: 549 mg/dL — ABNORMAL HIGH (ref <150)

## 2019-08-11 LAB — MICROALBUMIN, URINE: Microalb, Ur: 1.1 mg/dL

## 2019-08-17 ENCOUNTER — Other Ambulatory Visit: Payer: Self-pay

## 2019-08-17 DIAGNOSIS — R42 Dizziness and giddiness: Secondary | ICD-10-CM

## 2019-08-17 DIAGNOSIS — E119 Type 2 diabetes mellitus without complications: Secondary | ICD-10-CM

## 2019-08-17 DIAGNOSIS — J019 Acute sinusitis, unspecified: Secondary | ICD-10-CM

## 2019-08-17 DIAGNOSIS — J0141 Acute recurrent pansinusitis: Secondary | ICD-10-CM

## 2019-08-17 DIAGNOSIS — E118 Type 2 diabetes mellitus with unspecified complications: Secondary | ICD-10-CM

## 2019-08-17 MED ORDER — MOMETASONE FUROATE 0.1 % EX CREA: 1.0000 "application " | TOPICAL_CREAM | Freq: Every day | CUTANEOUS | 0 refills | Status: DC

## 2019-08-17 MED ORDER — SOLIFENACIN SUCCINATE 10 MG PO TABS: 10.0000 mg | ORAL_TABLET | Freq: Every day | ORAL | 2 refills | Status: DC

## 2019-08-17 MED ORDER — PIOGLITAZONE HCL 30 MG PO TABS: 30.0000 mg | ORAL_TABLET | Freq: Every day | ORAL | 3 refills | Status: DC

## 2019-08-17 MED ORDER — LOSARTAN POTASSIUM 25 MG PO TABS: ORAL_TABLET | ORAL | 2 refills | Status: DC

## 2019-08-17 MED ORDER — METFORMIN HCL 1000 MG PO TABS: 1000.0000 mg | ORAL_TABLET | Freq: Two times a day (BID) | ORAL | 0 refills | Status: DC

## 2019-08-17 MED ORDER — FLUTICASONE PROPIONATE 50 MCG/ACT NA SUSP: 2.0000 | Freq: Every day | NASAL | 6 refills | Status: DC

## 2019-08-17 MED ORDER — MECLIZINE HCL 12.5 MG PO TABS: 12.5000 mg | ORAL_TABLET | Freq: Three times a day (TID) | ORAL | 0 refills | Status: DC | PRN

## 2019-08-17 MED ORDER — TRIAMCINOLONE ACETONIDE 0.1 % EX CREA: 1.0000 "application " | TOPICAL_CREAM | Freq: Two times a day (BID) | CUTANEOUS | 0 refills | Status: DC

## 2019-08-17 MED ORDER — PANTOPRAZOLE SODIUM 40 MG PO TBEC: 40.0000 mg | DELAYED_RELEASE_TABLET | Freq: Two times a day (BID) | ORAL | 3 refills | Status: DC

## 2019-08-17 MED ORDER — MOMETASONE FUROATE 50 MCG/ACT NA SUSP: 2.0000 | Freq: Every day | NASAL | 1 refills | Status: DC

## 2019-08-17 MED ORDER — ATORVASTATIN CALCIUM 40 MG PO TABS: 40.0000 mg | ORAL_TABLET | Freq: Every day | ORAL | 0 refills | Status: DC

## 2019-09-05 ENCOUNTER — Other Ambulatory Visit: Payer: Self-pay

## 2019-09-05 ENCOUNTER — Telehealth: Payer: Self-pay

## 2019-09-05 NOTE — Telephone Encounter (Signed)
Pt stopped by office today, she's wanting to receive new rx of Oxybutynin -er 10 mg

## 2019-09-05 NOTE — Telephone Encounter (Signed)
Okay to refill? 

## 2019-09-06 ENCOUNTER — Other Ambulatory Visit: Payer: Self-pay

## 2019-09-06 MED ORDER — OXYBUTYNIN CHLORIDE ER 10 MG PO TB24
10.0000 mg | ORAL_TABLET | Freq: Every day | ORAL | 0 refills | Status: DC
Start: 2019-09-06 — End: 2019-09-22

## 2019-09-06 NOTE — Telephone Encounter (Signed)
Pt rx sent to pharmacy

## 2019-09-20 ENCOUNTER — Telehealth: Payer: Self-pay | Admitting: Family Medicine

## 2019-09-20 NOTE — Progress Notes (Signed)
°  Chronic Care Management   Note  09/20/2019 Name: Rhonda Fields MRN: [id number] DOB: 31-Jan-1950  Rhonda Fields is a 70 y.o. year old female who is a primary care patient of Pickard, Cammie Mcgee, MD. I reached out to Huntley Estelle by phone today in response to a referral sent by Ms. Tommy Rainwater [place]'s PCP, Susy Frizzle, MD.   Ms. Matherly was given information about Chronic Care Management services today including:  1. CCM service includes personalized support from designated clinical staff supervised by her physician, including individualized plan of care and coordination with other care providers 2. 24/7 contact phone numbers for assistance for urgent and routine care needs. 3. Service will only be billed when office clinical staff spend 20 minutes or more in a month to coordinate care. 4. Only one practitioner may furnish and bill the service in a calendar month. 5. The patient may stop CCM services at any time (effective at the end of the month) by phone call to the office staff.   Patient agreed to services and verbal consent obtained.   Follow up plan:   Carley Perdue [employer] Scheduler

## 2019-09-21 ENCOUNTER — Other Ambulatory Visit: Payer: Medicare Other

## 2019-09-21 ENCOUNTER — Other Ambulatory Visit: Payer: Self-pay

## 2019-09-21 DIAGNOSIS — E7800 Pure hypercholesterolemia, unspecified: Secondary | ICD-10-CM

## 2019-09-22 ENCOUNTER — Other Ambulatory Visit: Payer: Self-pay

## 2019-09-22 ENCOUNTER — Ambulatory Visit (INDEPENDENT_AMBULATORY_CARE_PROVIDER_SITE_OTHER): Payer: Medicare Other | Admitting: Family Medicine

## 2019-09-22 VITALS — BP 110/70 | HR 70 | Temp 96.5°F | Wt 158.0 lb

## 2019-09-22 DIAGNOSIS — E781 Pure hyperglyceridemia: Secondary | ICD-10-CM | POA: Diagnosis not present

## 2019-09-22 DIAGNOSIS — E7800 Pure hypercholesterolemia, unspecified: Secondary | ICD-10-CM | POA: Diagnosis not present

## 2019-09-22 DIAGNOSIS — N3281 Overactive bladder: Secondary | ICD-10-CM | POA: Diagnosis not present

## 2019-09-22 LAB — LIPID PANEL
Cholesterol: 119 mg/dL (ref <200)
HDL: 42 mg/dL — ABNORMAL LOW (ref >=50)
LDL Cholesterol (Calc): 48 mg/dL (calc)
Non-HDL Cholesterol (Calc): 77 mg/dL (calc) (ref <130)
Total CHOL/HDL Ratio: 2.8 (calc) (ref <5.0)
Triglycerides: 236 mg/dL — ABNORMAL HIGH (ref <150)

## 2019-09-22 MED ORDER — OXYBUTYNIN CHLORIDE 5 MG PO TABS: 5.0000 mg | ORAL_TABLET | Freq: Three times a day (TID) | ORAL | 1 refills | Status: DC

## 2019-09-22 NOTE — Progress Notes (Signed)
Subjective:    Patient ID: Rhonda Fields, female    DOB: [birth year], 69 y.o.   MRN: [id number]  Medication Refill  07/2018 Patient is here today for a checkup regarding her diabetes.  Her blood pressures well controlled.  She denies any chest pain shortness of breath or dyspnea on exertion.  Her weight has remained stable since her last office visit and is 154 pounds today.  Her diabetes test has returned and is well controlled at 6.4.  She denies any polyuria, polydipsia, or blurry vision.  She does occasionally get some numbness in her feet if she sits a certain way for prolonged period of time however she denies any signs of peripheral neuropathy.  Diabetic foot exam was performed today and is completely normal.  She has normal sensation to 10 g monofilament bilaterally and normal pulses in both feet.  She just had her diabetic eye exam performed and this is reportedly normal.  She denies any myalgias or right upper quadrant pain.  Her most recent lab work is listed below:  She does occasionally have severe low back pain primarily on the lumbar spine just to the right of midline around the level of L4-L5.  She denies any sciatica or leg weakness.  At that time, my plan was: Diabetes is well controlled.  I will make no changes in her medication at this time.  Diabetic eye exam and foot exam are normal.  LDL cholesterol is well below goal of 100.  Blood pressures well controlled today.  Regular anticipatory guidance is provided.  Patient did request a refill on her hydrocodone/acetaminophen that she uses sparingly for low back pain.  X-ray did confirm mild anterior listhesis at L4-L5 and degenerative disc disease.  I am comfortable with her using the medication sparingly as she tends to take only a few pills each month when ibuprofen does not relieve her back pain.  I have no concern over abuse or diversion.  I did refill her hydrocodone which she takes sparingly for low back  pain  08/10/19 Patient has run out of her Lipitor and Actos for the last 2 weeks.  However she has been taking her other medications regularly.  She denies any polydipsia.  She denies any blurry vision.  She denies any neuropathy in her feet.  Diabetic foot exam was performed today and is normal.  She denies any chest pain shortness of breath or dyspnea on exertion.  Blood pressure is well controlled at 124/90.  She does have symptoms of overactive bladder.  She states that she has urge incontinence.  She also has stress incontinence.  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  She would be interested in trying medication to help with urinary incontinence.  She continues to use the back pain medication very sparingly and would like a refill on this.  She is not overusing the medication and only takes it when she has extreme pain.  She adamantly refuses a mammogram or colonoscopy.  She will consent to a bone density test.  She will also consent to Pneumovax 23.  She has had her Covid vaccination.  At that time, my plan was: Strongly recommended Pneumovax 23.  Patient agrees and receive that today.  Also recommended a mammogram and a colonoscopy however the patient politely declines this.  She has had her Covid vaccination.  She will consent to a bone density test.  I will refill her pain medication that she uses  sparingly for her low back pain.  Her blood pressure today is well controlled.  I will check a CBC, CMP, fasting lipid panel, and a hemoglobin A1c.  We will try Vesicare 10 mg a day for urge incontinence.  However I explained that this will likely not help with stress incontinence.  She is already performing Kegel exercises to try to help with that.  09/22/19 Lab on 09/21/2019  Component Date Value Ref Range Status  . Cholesterol 09/21/2019 119  <200 mg/dL Final  . HDL 09/21/2019 42* > OR = 50 mg/dL Final  . Triglycerides 09/21/2019 236* <150 mg/dL Final   Comment:  . If a non-fasting specimen was collected, consider repeat triglyceride testing on a fasting specimen if clinically indicated.  Yates Decamp et al. J. of Clin. Lipidol. 9242;6:834-196. .   . LDL Cholesterol (Calc) 09/21/2019 48  mg/dL (calc) Final   Comment: Reference range: <100 . Desirable range <100 mg/dL for primary prevention;   <70 mg/dL for patients with CHD or diabetic patients  with > or = 2 CHD risk factors. Marland Kitchen LDL-C is now calculated using the Martin-Hopkins  calculation, which is a validated novel method providing  better accuracy than the Friedewald equation in the  estimation of LDL-C.  Cresenciano Genre et al. Annamaria Helling. 2229;798(92): 2061-2068  (http://education.[facility].com/faq/FAQ164)   . Total CHOL/HDL Ratio 09/21/2019 2.8  <5.0 (calc) Final  . Non-HDL Cholesterol (Calc) 09/21/2019 77  <130 mg/dL (calc) Final   Comment: For patients with diabetes plus 1 major ASCVD risk  factor, treating to a non-HDL-C goal of <100 mg/dL  (LDL-C of <70 mg/dL) is considered a therapeutic  option.    Patient's triglycerides were over 500.  LDL cholesterol was not able to be calculated at her last visit.  Therefore I recommended that she resume her Lipitor 40 mg a day and start taking Fish oil 2000 mg a day.  She came in yesterday and had her labs checked.  Those are listed above.  Total cholesterol is excellent at 119.  Triglycerides are down into the 230s.  With this patient I would be very happy with a triglyceride level less than 250.  LDL cholesterol is 48.  She denies any myalgias or right upper quadrant pain on the statin Past Medical History:  Diagnosis Date  . Anxiety   . Arrhythmia   . Chronic headaches   . Depression   . Diabetes mellitus without complication ([place])   . GERD (gastroesophageal reflux disease)   . Hard of hearing   . IBS (irritable bowel syndrome) 1986   Past Surgical History:  Procedure Laterality Date  . ABDOMINAL HYSTERECTOMY    . CHOLECYSTECTOMY    .  ERCP  04/20/2011   Procedure: ENDOSCOPIC RETROGRADE CHOLANGIOPANCREATOGRAPHY (ERCP);  Surgeon: Inda Castle, MD;  Location: [facility];  Service: Endoscopy;  Laterality: N/A;  case is at 1430 in or  . ERCP  04/20/2011   Procedure: ENDOSCOPIC RETROGRADE CHOLANGIOPANCREATOGRAPHY (ERCP);  Surgeon: Inda Castle, MD;  Location: [facility];  Service: Gastroenterology;  Laterality: N/A;   Current Outpatient Medications on File Prior to Visit  Medication Sig Dispense Refill  . ACCU-CHEK AVIVA PLUS test strip TEST BLOOD SUGAR EVERY MORNING 100 strip 1  . Accu-Chek Softclix Lancets lancets TEST BLOOD SUGAR EVERY MORNING 100 each 1  . atorvastatin (LIPITOR) 40 MG tablet Take 1 tablet (40 mg total) by mouth daily. 90 tablet 0  . Blood Glucose Monitoring Suppl (ACCU-CHEK AVIVA PLUS) w/Device KIT Check BS qam DX:  e11.9 1 kit 0  . cholecalciferol (VITAMIN D) 1000 UNITS tablet Take 1,000 Units by mouth daily.    . Cinnamon 500 MG TABS Take by mouth.    . fluticasone (FLONASE) 50 MCG/ACT nasal spray Place 2 sprays into both nostrils daily. 16 g 6  . HYDROcodone-acetaminophen (NORCO) 5-325 MG tablet Take 1 tablet by mouth every 6 (six) hours as needed for moderate pain. 30 tablet 0  . Lancets (ACCU-CHEK MULTICLIX) lancets Check BS QAM - DX: E11.9 100 each 12  . losartan (COZAAR) 25 MG tablet TAKE 1 TABLET EVERY DAY (DISCONTINUE LOSARTAN 50 MG) 90 tablet 2  . Magnesium 500 MG TABS Take 500 mg by mouth daily.    . meclizine (ANTIVERT) 12.5 MG tablet Take 1-2 tablets (12.5-25 mg total) by mouth 3 (three) times daily as needed for dizziness. 30 tablet 0  . metFORMIN (GLUCOPHAGE) 1000 MG tablet Take 1 tablet (1,000 mg total) by mouth 2 (two) times daily with a meal. 180 tablet 0  . mometasone (ELOCON) 0.1 % cream Apply 1 application topically daily. 45 g 0  . mometasone (NASONEX) 50 MCG/ACT nasal spray Place 2 sprays into the nose daily. 17 g 1  . oxybutynin (DITROPAN XL) 10 MG 24 hr tablet Take 1 tablet (10 mg  total) by mouth at bedtime. 90 tablet 0  . pantoprazole (PROTONIX) 40 MG tablet Take 1 tablet (40 mg total) by mouth 2 (two) times daily. 180 tablet 3  . pioglitazone (ACTOS) 30 MG tablet Take 1 tablet (30 mg total) by mouth daily. 90 tablet 3  . solifenacin (VESICARE) 10 MG tablet Take 1 tablet (10 mg total) by mouth daily. 30 tablet 2  . triamcinolone cream (KENALOG) 0.1 % Apply 1 application topically 2 (two) times daily. 30 g 0  . vitamin E 100 UNIT capsule Take 100 Units by mouth daily.     No current facility-administered medications on file prior to visit.   Allergies  Allergen Reactions  . Contrast Media [Iodinated Diagnostic Agents] Nausea And Vomiting  . Penicillins Hives and Itching   Social History   Socioeconomic History  . Marital status: Widowed    Spouse name: Not on file  . Number of children: 4  . Years of education: Not on file  . Highest education level: Not on file  Occupational History  . Occupation: Unemployed  Tobacco Use  . Smoking status: Current Every Day Smoker    Packs/day: 1.00    Years: 40.00    Pack years: 40.00    Types: Cigarettes  . Smokeless tobacco: Never Used  Substance and Sexual Activity  . Alcohol use: No  . Drug use: No  . Sexual activity: Not on file  Other Topics Concern  . Not on file  Social History Narrative  . Not on file   Social Determinants of Health   Financial Resource Strain:   . Difficulty of Paying Living Expenses: Not on file  Food Insecurity:   . Worried About Running Out of Food in the Last Year: Not on file  . Ran Out of Food in the Last Year: Not on file  Transportation Needs:   . Lack of Transportation (Medical): Not on file  . Lack of Transportation (Non-Medical): Not on file  Physical Activity:   . Days of Exercise per Week: Not on file  . Minutes of Exercise per Session: Not on file  Stress:   . Feeling of Stress : Not on file  Social Connections:   .   Frequency of Communication with Friends and  Family: Not on file  . Frequency of Social Gatherings with Friends and Family: Not on file  . Attends Religious Services: Not on file  . Active Member of Clubs or Organizations: Not on file  . Attends Club or Organization Meetings: Not on file  . Marital Status: Not on file  Intimate Partner Violence:   . Fear of Current or Ex-Partner: Not on file  . Emotionally Abused: Not on file  . Physically Abused: Not on file  . Sexually Abused: Not on file     Review of Systems  All other systems reviewed and are negative.      Objective:   Physical Exam Vitals reviewed.  Cardiovascular:     Rate and Rhythm: Normal rate and regular rhythm.     Heart sounds: Normal heart sounds.  Pulmonary:     Effort: Pulmonary effort is normal. No respiratory distress.     Breath sounds: Normal breath sounds. No wheezing or rales.  Chest:     Chest wall: No tenderness.  Abdominal:     General: Bowel sounds are normal. There is no distension.     Palpations: Abdomen is soft.     Tenderness: There is no abdominal tenderness. There is no guarding or rebound.           Assessment & Plan:   Pure hypercholesterolemia  Hypertriglyceridemia  OAB (overactive bladder)  Cholesterol is much better.  I recommended rechecking all of her fasting lab work including her A1c in 6 months.  Patient would like to switch her oxybutynin XL 10 mg a day because the medication is not lasting all day long.  She was unable to afford Vesicare.  She would like to try immediate release oxybutynin.  Therefore we will start her on oxybutynin 5 mg twice daily.  Patient can uptitrate to 5 mg 3 times a day if she tolerates the medication. 

## 2019-10-03 ENCOUNTER — Other Ambulatory Visit: Payer: Self-pay | Admitting: Family Medicine

## 2019-10-18 ENCOUNTER — Other Ambulatory Visit: Payer: Self-pay | Admitting: Family Medicine

## 2019-10-18 DIAGNOSIS — E119 Type 2 diabetes mellitus without complications: Secondary | ICD-10-CM

## 2019-10-30 ENCOUNTER — Ambulatory Visit: Payer: Medicare Other

## 2019-11-01 ENCOUNTER — Telehealth: Payer: Self-pay

## 2019-11-01 ENCOUNTER — Other Ambulatory Visit: Payer: Self-pay

## 2019-11-01 NOTE — Telephone Encounter (Signed)
Pt had Rita from [facility] call and pt is unaware of RX change oxybutynin (DITROPAN) 5 MG tablet she is wanting to know if why the dose has changed.   Pt call back [phone]

## 2019-11-01 NOTE — Telephone Encounter (Signed)
Rhonda Fields is now taking 10 mg 3 times a day, this is the very first prescription that was written for her, as she stated. She needs her prescription changed to 10 mg 3 times a day so [facility] can have right prescription on file.

## 2019-11-02 NOTE — Telephone Encounter (Signed)
LVM for pt to let her know that the provider will not be increasing the dosage on her oxybutynin do to side effects.

## 2019-11-02 NOTE — Telephone Encounter (Signed)
I would not go above 5 mg potid due to side effects.  Please stay at 5 mg potid.

## 2019-11-20 ENCOUNTER — Other Ambulatory Visit: Payer: Medicare HMO

## 2019-12-26 DIAGNOSIS — E119 Type 2 diabetes mellitus without complications: Secondary | ICD-10-CM | POA: Diagnosis not present

## 2019-12-26 LAB — HM DIABETES EYE EXAM

## 2020-01-18 ENCOUNTER — Other Ambulatory Visit: Payer: Self-pay | Admitting: Family Medicine

## 2020-01-18 DIAGNOSIS — E119 Type 2 diabetes mellitus without complications: Secondary | ICD-10-CM

## 2020-01-18 DIAGNOSIS — E118 Type 2 diabetes mellitus with unspecified complications: Secondary | ICD-10-CM

## 2020-01-22 MED ORDER — METFORMIN HCL 1000 MG PO TABS: 1000.0000 mg | ORAL_TABLET | Freq: Two times a day (BID) | ORAL | 3 refills | Status: DC

## 2020-01-22 MED ORDER — TRUE METRIX LEVEL 1 LOW VI SOLN: 3 refills | Status: DC

## 2020-01-22 MED ORDER — TRUE METRIX METER W/DEVICE KIT: PACK | 3 refills | Status: DC

## 2020-01-22 MED ORDER — GLUCOSE BLOOD VI STRP: ORAL_STRIP | 3 refills | Status: DC

## 2020-01-22 MED ORDER — TRUEPLUS LANCETS 33G MISC: 3 refills | Status: DC

## 2020-01-22 MED ORDER — PIOGLITAZONE HCL 30 MG PO TABS: 30.0000 mg | ORAL_TABLET | Freq: Every day | ORAL | 3 refills | Status: DC

## 2020-01-22 MED ORDER — PANTOPRAZOLE SODIUM 40 MG PO TBEC: 40.0000 mg | DELAYED_RELEASE_TABLET | Freq: Two times a day (BID) | ORAL | 3 refills | Status: DC

## 2020-01-24 ENCOUNTER — Telehealth: Payer: Self-pay

## 2020-01-24 MED ORDER — LOSARTAN POTASSIUM 25 MG PO TABS: ORAL_TABLET | ORAL | 2 refills | Status: DC

## 2020-01-24 MED ORDER — BD SWAB SINGLE USE REGULAR PADS: MEDICATED_PAD | 3 refills | Status: DC

## 2020-01-24 MED ORDER — ATORVASTATIN CALCIUM 40 MG PO TABS: 40.0000 mg | ORAL_TABLET | Freq: Every day | ORAL | 3 refills | Status: DC

## 2020-01-24 NOTE — Telephone Encounter (Signed)
Prescription sent to pharmacy.

## 2020-01-24 NOTE — Telephone Encounter (Signed)
Refill request for bd single use swab, atorvastatin 40 mg tab, losartat 25 mg  CB# [phone]

## 2020-01-24 NOTE — Telephone Encounter (Signed)
Med refill Atorvastatin 40mg    [facility]

## 2020-02-14 ENCOUNTER — Telehealth: Payer: Medicare Other | Admitting: Family Medicine

## 2020-02-14 NOTE — Telephone Encounter (Signed)
Pt has change her pharmacy to [facility] fax # [phone] need refill on Metformin, Blood Glucose Monitoring Supply, Ditropan

## 2020-02-15 ENCOUNTER — Other Ambulatory Visit: Payer: Self-pay

## 2020-02-15 DIAGNOSIS — E119 Type 2 diabetes mellitus without complications: Secondary | ICD-10-CM

## 2020-02-15 MED ORDER — TRUE METRIX METER W/DEVICE KIT: PACK | 3 refills | Status: DC

## 2020-02-15 MED ORDER — OXYBUTYNIN CHLORIDE 5 MG PO TABS: 5.0000 mg | ORAL_TABLET | Freq: Three times a day (TID) | ORAL | 3 refills | Status: DC

## 2020-02-15 MED ORDER — TRUE METRIX LEVEL 1 LOW VI SOLN: 3 refills | Status: DC

## 2020-02-15 MED ORDER — TRUEPLUS LANCETS 33G MISC: 3 refills | Status: AC

## 2020-02-15 MED ORDER — METFORMIN HCL 1000 MG PO TABS: 1000.0000 mg | ORAL_TABLET | Freq: Two times a day (BID) | ORAL | 3 refills | Status: DC

## 2020-02-15 MED ORDER — GLUCOSE BLOOD VI STRP: ORAL_STRIP | 3 refills | Status: AC

## 2020-02-15 NOTE — Telephone Encounter (Signed)
Medications sent to pharmacy on 01/22/2020

## 2020-02-19 ENCOUNTER — Other Ambulatory Visit: Payer: Self-pay

## 2020-02-19 ENCOUNTER — Telehealth: Payer: Self-pay

## 2020-02-19 DIAGNOSIS — J019 Acute sinusitis, unspecified: Secondary | ICD-10-CM

## 2020-02-19 DIAGNOSIS — E119 Type 2 diabetes mellitus without complications: Secondary | ICD-10-CM

## 2020-02-19 DIAGNOSIS — E118 Type 2 diabetes mellitus with unspecified complications: Secondary | ICD-10-CM

## 2020-02-19 MED ORDER — TRUE METRIX LEVEL 1 LOW VI SOLN: 3 refills | Status: AC

## 2020-02-19 MED ORDER — PANTOPRAZOLE SODIUM 40 MG PO TBEC: 40.0000 mg | DELAYED_RELEASE_TABLET | Freq: Two times a day (BID) | ORAL | 3 refills | Status: DC

## 2020-02-19 MED ORDER — TRUE METRIX METER W/DEVICE KIT: PACK | 3 refills | Status: DC

## 2020-02-19 MED ORDER — LOSARTAN POTASSIUM 25 MG PO TABS: ORAL_TABLET | ORAL | 2 refills | Status: DC

## 2020-02-19 MED ORDER — METFORMIN HCL 1000 MG PO TABS: 1000.0000 mg | ORAL_TABLET | Freq: Two times a day (BID) | ORAL | 3 refills | Status: DC

## 2020-02-19 MED ORDER — PIOGLITAZONE HCL 30 MG PO TABS: 30.0000 mg | ORAL_TABLET | Freq: Every day | ORAL | 3 refills | Status: DC

## 2020-02-19 MED ORDER — FLUTICASONE PROPIONATE 50 MCG/ACT NA SUSP: 2.0000 | Freq: Every day | NASAL | 6 refills | Status: DC

## 2020-02-19 NOTE — Telephone Encounter (Signed)
Pt came by today to update pharmacy due to insurance change. Meds sent to the correct pharmacy for pt

## 2020-02-28 ENCOUNTER — Other Ambulatory Visit: Payer: Self-pay

## 2020-04-07 ENCOUNTER — Other Ambulatory Visit: Payer: Self-pay | Admitting: Family Medicine

## 2020-04-23 ENCOUNTER — Other Ambulatory Visit: Payer: Self-pay | Admitting: Family Medicine

## 2020-05-09 ENCOUNTER — Other Ambulatory Visit: Payer: Self-pay | Admitting: *Deleted

## 2020-05-09 MED ORDER — OXYBUTYNIN CHLORIDE 5 MG PO TABS: 5.0000 mg | ORAL_TABLET | Freq: Three times a day (TID) | ORAL | 0 refills | Status: DC

## 2020-05-13 ENCOUNTER — Other Ambulatory Visit: Payer: Medicare HMO

## 2020-05-13 ENCOUNTER — Other Ambulatory Visit: Payer: Self-pay

## 2020-05-13 DIAGNOSIS — E7800 Pure hypercholesterolemia, unspecified: Secondary | ICD-10-CM | POA: Diagnosis not present

## 2020-05-13 DIAGNOSIS — E119 Type 2 diabetes mellitus without complications: Secondary | ICD-10-CM

## 2020-05-13 DIAGNOSIS — E781 Pure hyperglyceridemia: Secondary | ICD-10-CM | POA: Diagnosis not present

## 2020-05-13 DIAGNOSIS — I10 Essential (primary) hypertension: Secondary | ICD-10-CM | POA: Diagnosis not present

## 2020-05-14 ENCOUNTER — Ambulatory Visit (INDEPENDENT_AMBULATORY_CARE_PROVIDER_SITE_OTHER): Payer: Medicare HMO | Admitting: Family Medicine

## 2020-05-14 ENCOUNTER — Encounter: Payer: Self-pay | Admitting: Family Medicine

## 2020-05-14 VITALS — BP 130/64 | HR 72 | Temp 97.9°F | Resp 16 | Ht 63.0 in | Wt 156.0 lb

## 2020-05-14 DIAGNOSIS — E119 Type 2 diabetes mellitus without complications: Secondary | ICD-10-CM

## 2020-05-14 DIAGNOSIS — E781 Pure hyperglyceridemia: Secondary | ICD-10-CM

## 2020-05-14 DIAGNOSIS — Z780 Asymptomatic menopausal state: Secondary | ICD-10-CM | POA: Diagnosis not present

## 2020-05-14 DIAGNOSIS — E7800 Pure hypercholesterolemia, unspecified: Secondary | ICD-10-CM

## 2020-05-14 DIAGNOSIS — N3281 Overactive bladder: Secondary | ICD-10-CM

## 2020-05-14 LAB — COMPLETE METABOLIC PANEL WITH GFR
AG Ratio: 1.4 (calc) (ref 1.0–2.5)
ALT: 22 U/L (ref 6–29)
AST: 27 U/L (ref 10–35)
Albumin: 4.1 g/dL (ref 3.6–5.1)
Alkaline phosphatase (APISO): 71 U/L (ref 37–153)
BUN/Creatinine Ratio: 14 (calc) (ref 6–22)
BUN: 15 mg/dL (ref 7–25)
CO2: 24 mmol/L (ref 20–32)
Calcium: 9.3 mg/dL (ref 8.6–10.4)
Chloride: 102 mmol/L (ref 98–110)
Creat: 1.04 mg/dL — ABNORMAL HIGH (ref 0.50–0.99)
GFR, Est African American: 64 mL/min/{1.73_m2} (ref >=60)
GFR, Est Non African American: 55 mL/min/{1.73_m2} — ABNORMAL LOW (ref >=60)
Globulin: 3 g/dL (calc) (ref 1.9–3.7)
Glucose, Bld: 117 mg/dL — ABNORMAL HIGH (ref 65–99)
Potassium: 4.8 mmol/L (ref 3.5–5.3)
Sodium: 136 mmol/L (ref 135–146)
Total Bilirubin: 0.6 mg/dL (ref 0.2–1.2)
Total Protein: 7.1 g/dL (ref 6.1–8.1)

## 2020-05-14 LAB — CBC WITH DIFFERENTIAL/PLATELET
Absolute Monocytes: 459 cells/uL (ref 200–950)
Basophils Absolute: 54 cells/uL (ref 0–200)
Basophils Relative: 0.6 %
Eosinophils Absolute: 180 cells/uL (ref 15–500)
Eosinophils Relative: 2 %
HCT: 39.5 % (ref 35.0–45.0)
Hemoglobin: 12.9 g/dL (ref 11.7–15.5)
Lymphs Abs: 1899 cells/uL (ref 850–3900)
MCH: 29.2 pg (ref 27.0–33.0)
MCHC: 32.7 g/dL (ref 32.0–36.0)
MCV: 89.4 fL (ref 80.0–100.0)
MPV: 9.3 fL (ref 7.5–12.5)
Monocytes Relative: 5.1 %
Neutro Abs: 6408 cells/uL (ref 1500–7800)
Neutrophils Relative %: 71.2 %
Platelets: 255 10*3/uL (ref 140–400)
RBC: 4.42 10*6/uL (ref 3.80–5.10)
RDW: 13.6 % (ref 11.0–15.0)
Total Lymphocyte: 21.1 %
WBC: 9 10*3/uL (ref 3.8–10.8)

## 2020-05-14 LAB — LIPID PANEL
Cholesterol: 104 mg/dL (ref <200)
HDL: 35 mg/dL — ABNORMAL LOW (ref >=50)
LDL Cholesterol (Calc): 41 mg/dL (calc)
Non-HDL Cholesterol (Calc): 69 mg/dL (calc) (ref <130)
Total CHOL/HDL Ratio: 3 (calc) (ref <5.0)
Triglycerides: 213 mg/dL — ABNORMAL HIGH (ref <150)

## 2020-05-14 LAB — TSH: TSH: 1.12 mIU/L (ref 0.40–4.50)

## 2020-05-14 LAB — HEMOGLOBIN A1C
Hgb A1c MFr Bld: 6.8 % of total Hgb — ABNORMAL HIGH (ref <5.7)
Mean Plasma Glucose: 148 mg/dL
eAG (mmol/L): 8.2 mmol/L

## 2020-05-14 LAB — MICROALBUMIN / CREATININE URINE RATIO
Creatinine, Urine: 33 mg/dL (ref 20–275)
Microalb Creat Ratio: 6 mcg/mg creat (ref <30)
Microalb, Ur: 0.2 mg/dL

## 2020-05-14 MED ORDER — HYDROCODONE-ACETAMINOPHEN 5-325 MG PO TABS: 1.0000 | ORAL_TABLET | Freq: Four times a day (QID) | ORAL | 0 refills | Status: DC | PRN

## 2020-05-14 NOTE — Progress Notes (Signed)
Subjective:    Patient ID: Rhonda Fields, female    DOB: 1950/05/27, 70 y.o.   MRN: [id number]   Patient is here today for a follow-up of her diabetes and dyslipidemia.  Overall she states she feels well.  Her blood pressure today is excellent 130/64.  She denies any chest pain, shortness of breath, dyspnea on exertion.  She denies any polyuria, polydipsia, blurry vision.  Her most recent lab work is listed below and her A1c is acceptable at 6.8.  Her LDL cholesterol is also well below 100.  She has a history of hypertriglyceridemia and dyslipidemia however for this patient, her triglycerides are actually pretty good at 213.  The remainder of her lab work is excellent aside from a mild elevation in her creatinine from her baseline.  However she reports feeling tired all the time.  She states that she does not feel rested.  She feels sleepy.  If she is not doing something she can easily fall asleep.  She denies snoring.  She does have a remote history of being told she has sleep apnea however she refused to wear CPAP.  She is also on oxybutynin Lab on 05/13/2020  Component Date Value Ref Range Status  . Cholesterol 05/13/2020 104  <200 mg/dL Final  . HDL 05/13/2020 35* > OR = 50 mg/dL Final  . Triglycerides 05/13/2020 213* <150 mg/dL Final   Comment: . If a non-fasting specimen was collected, consider repeat triglyceride testing on a fasting specimen if clinically indicated.  Yates Decamp et al. J. of Clin. Lipidol. 6270;3:500-938. .   . LDL Cholesterol (Calc) 05/13/2020 41  mg/dL (calc) Final   Comment: Reference range: <100 . Desirable range <100 mg/dL for primary prevention;   <70 mg/dL for patients with CHD or diabetic patients  with > or = 2 CHD risk factors. Marland Kitchen LDL-C is now calculated using the Martin-Hopkins  calculation, which is a validated novel method providing  better accuracy than the Friedewald equation in the  estimation of LDL-C.  Cresenciano Genre et al. Annamaria Helling.  1829;937(16): 2061-2068  (http://education.[facility].com/faq/FAQ164)   . Total CHOL/HDL Ratio 05/13/2020 3.0  <5.0 (calc) Final  . Non-HDL Cholesterol (Calc) 05/13/2020 69  <130 mg/dL (calc) Final   Comment: For patients with diabetes plus 1 major ASCVD risk  factor, treating to a non-HDL-C goal of <100 mg/dL  (LDL-C of <70 mg/dL) is considered a therapeutic  option.   . Hgb A1c MFr Bld 05/13/2020 6.8* <5.7 % of total Hgb Final   Comment: For someone without known diabetes, a hemoglobin A1c value of 6.5% or greater indicates that they may have  diabetes and this should be confirmed with a follow-up  test. . For someone with known diabetes, a value <7% indicates  that their diabetes is well controlled and a value  greater than or equal to 7% indicates suboptimal  control. A1c targets should be individualized based on  duration of diabetes, age, comorbid conditions, and  other considerations. . Currently, no consensus exists regarding use of hemoglobin A1c for diagnosis of diabetes for children. .   . Mean Plasma Glucose 05/13/2020 148  mg/dL Final  . eAG (mmol/L) 05/13/2020 8.2  mmol/L Final  . Glucose, Bld 05/13/2020 117* 65 - 99 mg/dL Final   Comment: .            Fasting reference interval . For someone without known diabetes, a glucose value between 100 and 125 mg/dL is consistent with prediabetes and should be confirmed with  a follow-up test. .   . BUN 05/13/2020 15  7 - 25 mg/dL Final  . Creat 05/13/2020 1.04* 0.50 - 0.99 mg/dL Final   Comment: For patients >78 years of age, the reference limit for Creatinine is approximately 13% higher for people identified as African-American. .   . GFR, Est Non African American 05/13/2020 55* > OR = 60 mL/min/1.56m Final  . GFR, Est African American 05/13/2020 64  > OR = 60 mL/min/1.752mFinal  . BUN/Creatinine Ratio 05/13/2020 14  6 - 22 (calc) Final  . Sodium 05/13/2020 136  135 - 146 mmol/L Final  . Potassium  05/13/2020 4.8  3.5 - 5.3 mmol/L Final  . Chloride 05/13/2020 102  98 - 110 mmol/L Final  . CO2 05/13/2020 24  20 - 32 mmol/L Final  . Calcium 05/13/2020 9.3  8.6 - 10.4 mg/dL Final  . Total Protein 05/13/2020 7.1  6.1 - 8.1 g/dL Final  . Albumin 05/13/2020 4.1  3.6 - 5.1 g/dL Final  . Globulin 05/13/2020 3.0  1.9 - 3.7 g/dL (calc) Final  . AG Ratio 05/13/2020 1.4  1.0 - 2.5 (calc) Final  . Total Bilirubin 05/13/2020 0.6  0.2 - 1.2 mg/dL Final  . Alkaline phosphatase (APISO) 05/13/2020 71  37 - 153 U/L Final  . AST 05/13/2020 27  10 - 35 U/L Final  . ALT 05/13/2020 22  6 - 29 U/L Final  . Creatinine, Urine 05/13/2020 33  20 - 275 mg/dL Final  . Microalb, Ur 05/13/2020 0.2  mg/dL Final   Comment: Reference Range Not established   . Microalb Creat Ratio 05/13/2020 6  <30 mcg/mg creat Final   Comment: . The ADA defines abnormalities in albumin excretion as follows: . Marland Kitchenlbuminuria Category        Result (mcg/mg creatinine) . Normal to Mildly increased   <30 Moderately increased         30-299  Severely increased           > OR = 300 . The ADA recommends that at least two of three specimens collected within a 3-6 month period be abnormal before considering a patient to be within a diagnostic category.   . WBC 05/13/2020 9.0  3.8 - 10.8 Thousand/uL Final  . RBC 05/13/2020 4.42  3.80 - 5.10 Million/uL Final  . Hemoglobin 05/13/2020 12.9  11.7 - 15.5 g/dL Final  . HCT 05/13/2020 39.5  35.0 - 45.0 % Final  . MCV 05/13/2020 89.4  80.0 - 100.0 fL Final  . MCH 05/13/2020 29.2  27.0 - 33.0 pg Final  . MCHC 05/13/2020 32.7  32.0 - 36.0 g/dL Final  . RDW 05/13/2020 13.6  11.0 - 15.0 % Final  . Platelets 05/13/2020 255  140 - 400 Thousand/uL Final  . MPV 05/13/2020 9.3  7.5 - 12.5 fL Final  . Neutro Abs 05/13/2020 6,408  1,500 - 7,800 cells/uL Final  . Lymphs Abs 05/13/2020 1,899  850 - 3,900 cells/uL Final  . Absolute Monocytes 05/13/2020 459  200 - 950 cells/uL Final  . Eosinophils  Absolute 05/13/2020 180  15 - 500 cells/uL Final  . Basophils Absolute 05/13/2020 54  0 - 200 cells/uL Final  . Neutrophils Relative % 05/13/2020 71.2  % Final  . Total Lymphocyte 05/13/2020 21.1  % Final  . Monocytes Relative 05/13/2020 5.1  % Final  . Eosinophils Relative 05/13/2020 2.0  % Final  . Basophils Relative 05/13/2020 0.6  % Final  . TSH 05/13/2020 1.12  0.40 -  4.50 mIU/L Final    Past Medical History:  Diagnosis Date  . Anxiety   . Arrhythmia   . Chronic headaches   . Depression   . Diabetes mellitus without complication ([place])   . GERD (gastroesophageal reflux disease)   . Hard of hearing   . IBS (irritable bowel syndrome) 1986   Past Surgical History:  Procedure Laterality Date  . ABDOMINAL HYSTERECTOMY    . CHOLECYSTECTOMY    . ERCP  04/20/2011   Procedure: ENDOSCOPIC RETROGRADE CHOLANGIOPANCREATOGRAPHY (ERCP);  Surgeon: Inda Castle, MD;  Location: [facility];  Service: Endoscopy;  Laterality: N/A;  case is at 1430 in or  . ERCP  04/20/2011   Procedure: ENDOSCOPIC RETROGRADE CHOLANGIOPANCREATOGRAPHY (ERCP);  Surgeon: Inda Castle, MD;  Location: [facility];  Service: Gastroenterology;  Laterality: N/A;   Current Outpatient Medications on File Prior to Visit  Medication Sig Dispense Refill  . Alcohol Swabs (B-D SINGLE USE SWABS REGULAR) PADS USE AS DIRECTED TO MONITOR BLOOD GLUCOSE UP TO THREE TIMES DAILY 300 each 3  . Ascorbic Acid (VITAMIN C) 1000 MG tablet Take 1,000 mg by mouth daily.    Marland Kitchen aspirin 81 MG chewable tablet Chew by mouth daily.    Marland Kitchen atorvastatin (LIPITOR) 40 MG tablet Take 1 tablet (40 mg total) by mouth daily. 90 tablet 3  . BIOTIN 5000 PO Take by mouth.    . Blood Glucose Calibration (TRUE METRIX LEVEL 1) Low SOLN Use as directed to monitor FSBS 1x daily. Dx: E11.9 100 each 3  . Blood Glucose Monitoring Suppl (TRUE METRIX METER) w/Device KIT USE AS DIRECTED 1 kit 3  . Cinnamon 500 MG TABS Take by mouth.    . fluticasone (FLONASE) 50 MCG/ACT  nasal spray Place 2 sprays into both nostrils daily. 16 g 6  . losartan (COZAAR) 25 MG tablet TAKE 1 TABLET EVERY DAY (DISCONTINUE LOSARTAN 50 MG) 90 tablet 2  . metFORMIN (GLUCOPHAGE) 1000 MG tablet Take 1 tablet (1,000 mg total) by mouth 2 (two) times daily with a meal. 180 tablet 3  . Multiple Vitamin (MULTIVITAMIN WITH MINERALS) TABS tablet Take 1 tablet by mouth daily.    . Omega-3 Fatty Acids (FISH OIL) 1200 MG CPDR Take by mouth 2 (two) times daily.    Marland Kitchen oxybutynin (DITROPAN) 5 MG tablet Take 1 tablet (5 mg total) by mouth 3 (three) times daily. 270 tablet 0  . pantoprazole (PROTONIX) 40 MG tablet Take 1 tablet (40 mg total) by mouth 2 (two) times daily. 180 tablet 3  . pioglitazone (ACTOS) 30 MG tablet Take 1 tablet (30 mg total) by mouth daily. 90 tablet 3  . triamcinolone cream (KENALOG) 0.1 % Apply 1 application topically 2 (two) times daily. 30 g 0  . TRUEplus Lancets 33G MISC Use as directed to monitor FSBS 1x daily. Dx: E11.9 100 each 3  . zinc gluconate 50 MG tablet Take 50 mg by mouth daily.    Marland Kitchen glucose blood test strip Use as instructed 100 each 3   No current facility-administered medications on file prior to visit.   Allergies  Allergen Reactions  . Contrast Media [Iodinated Diagnostic Agents] Nausea And Vomiting  . Penicillins Hives and Itching   Social History   Socioeconomic History  . Marital status: Widowed    Spouse name: Not on file  . Number of children: 4  . Years of education: Not on file  . Highest education level: Not on file  Occupational History  . Occupation: Unemployed  Tobacco Use  . Smoking status: Current Every Day Smoker    Packs/day: 1.00    Years: 40.00    Pack years: 40.00    Types: Cigarettes  . Smokeless tobacco: Never Used  Substance and Sexual Activity  . Alcohol use: No  . Drug use: No  . Sexual activity: Not on file  Other Topics Concern  . Not on file  Social History Narrative  . Not on file   Social Determinants of  Health   Financial Resource Strain: Not on file  Food Insecurity: Not on file  Transportation Needs: Not on file  Physical Activity: Not on file  Stress: Not on file  Social Connections: Not on file  Intimate Partner Violence: Not on file     Review of Systems  All other systems reviewed and are negative.      Objective:   Physical Exam Vitals reviewed.  Cardiovascular:     Rate and Rhythm: Normal rate and regular rhythm.     Heart sounds: Normal heart sounds.  Pulmonary:     Effort: Pulmonary effort is normal. No respiratory distress.     Breath sounds: Normal breath sounds. No wheezing or rales.  Chest:     Chest wall: No tenderness.  Abdominal:     General: Bowel sounds are normal. There is no distension.     Palpations: Abdomen is soft.     Tenderness: There is no abdominal tenderness. There is no guarding or rebound.           Assessment & Plan:   Diabetes mellitus without complication ([place])  Pure hypercholesterolemia  Hypertriglyceridemia  OAB (overactive bladder)  Patient's lab work today is acceptable and her blood pressure is excellent.  Her A1c is below her goal of 7 and her LDL is below her goal of 100.  I am concerned that her hypersomnolence could be a combination of sleep apnea as well as oxybutynin.  She refuses CPAP however I recommended a trial of discontinuation of oxybutynin.  If she feels better off the medication we can try to get her insurance to cover Myrbetriq instead.  Patient refuses a mammogram or colonoscopy or any cancer screening.  I recommended Prevnar 20 but she defers at this time.  She will however allow me to schedule her for a bone density test

## 2020-05-24 ENCOUNTER — Ambulatory Visit (INDEPENDENT_AMBULATORY_CARE_PROVIDER_SITE_OTHER): Payer: Medicare HMO | Admitting: Family Medicine

## 2020-05-24 ENCOUNTER — Encounter: Payer: Self-pay | Admitting: Family Medicine

## 2020-05-24 ENCOUNTER — Other Ambulatory Visit: Payer: Self-pay

## 2020-05-24 VITALS — BP 142/74 | HR 96 | Temp 98.0°F | Resp 16 | Ht 63.0 in | Wt 154.0 lb

## 2020-05-24 DIAGNOSIS — M79671 Pain in right foot: Secondary | ICD-10-CM | POA: Diagnosis not present

## 2020-05-24 MED ORDER — PREDNISONE 20 MG PO TABS: ORAL_TABLET | ORAL | 0 refills | Status: DC

## 2020-05-24 NOTE — Progress Notes (Signed)
Subjective:    Patient ID: Rhonda Fields, female    DOB: 1950-08-25, 70 y.o.   MRN: [id number]  Patient reports a 1 week history of severe pain in her right ankle, right heel, right midfoot.  It occurred suddenly without provocation.  She was unable to bear weight on her foot due to the pain.  It would ache and throbbing keep her awake at night.  She states that originally was swollen however the swelling has subsided.  There is no erythema.  There is no warmth.  There is no bruising.  She denies any trauma.  The pain is most severe in the body of the calcaneus.  She also complains of pain with flexion and extension of her ankle as well as palpation over the dorsal midfoot on the bodies of the metatarsals.  She jumps often in pain when I palpate firmly over the metatarsals.   Past Medical History:  Diagnosis Date  . Anxiety   . Arrhythmia   . Chronic headaches   . Depression   . Diabetes mellitus without complication ([place])   . GERD (gastroesophageal reflux disease)   . Hard of hearing   . IBS (irritable bowel syndrome) 1986   Past Surgical History:  Procedure Laterality Date  . ABDOMINAL HYSTERECTOMY    . CHOLECYSTECTOMY    . ERCP  04/20/2011   Procedure: ENDOSCOPIC RETROGRADE CHOLANGIOPANCREATOGRAPHY (ERCP);  Surgeon: Inda Castle, MD;  Location: [facility];  Service: Endoscopy;  Laterality: N/A;  case is at 1430 in or  . ERCP  04/20/2011   Procedure: ENDOSCOPIC RETROGRADE CHOLANGIOPANCREATOGRAPHY (ERCP);  Surgeon: Inda Castle, MD;  Location: [facility];  Service: Gastroenterology;  Laterality: N/A;   Current Outpatient Medications on File Prior to Visit  Medication Sig Dispense Refill  . Alcohol Swabs (B-D SINGLE USE SWABS REGULAR) PADS USE AS DIRECTED TO MONITOR BLOOD GLUCOSE UP TO THREE TIMES DAILY 300 each 3  . Ascorbic Acid (VITAMIN C) 1000 MG tablet Take 1,000 mg by mouth daily.    Marland Kitchen aspirin 81 MG chewable tablet Chew by mouth daily.    Marland Kitchen atorvastatin (LIPITOR) 40  MG tablet Take 1 tablet (40 mg total) by mouth daily. 90 tablet 3  . BIOTIN 5000 PO Take by mouth.    . Blood Glucose Calibration (TRUE METRIX LEVEL 1) Low SOLN Use as directed to monitor FSBS 1x daily. Dx: E11.9 100 each 3  . Blood Glucose Monitoring Suppl (TRUE METRIX METER) w/Device KIT USE AS DIRECTED 1 kit 3  . Cinnamon 500 MG TABS Take by mouth.    . fluticasone (FLONASE) 50 MCG/ACT nasal spray Place 2 sprays into both nostrils daily. 16 g 6  . glucose blood test strip Use as instructed 100 each 3  . HYDROcodone-acetaminophen (NORCO) 5-325 MG tablet Take 1 tablet by mouth every 6 (six) hours as needed for moderate pain. 30 tablet 0  . losartan (COZAAR) 25 MG tablet TAKE 1 TABLET EVERY DAY (DISCONTINUE LOSARTAN 50 MG) 90 tablet 2  . metFORMIN (GLUCOPHAGE) 1000 MG tablet Take 1 tablet (1,000 mg total) by mouth 2 (two) times daily with a meal. 180 tablet 3  . Multiple Vitamin (MULTIVITAMIN WITH MINERALS) TABS tablet Take 1 tablet by mouth daily.    . Omega-3 Fatty Acids (FISH OIL) 1200 MG CPDR Take by mouth 2 (two) times daily.    Marland Kitchen oxybutynin (DITROPAN) 5 MG tablet Take 1 tablet (5 mg total) by mouth 3 (three) times daily. 270 tablet 0  .  pantoprazole (PROTONIX) 40 MG tablet Take 1 tablet (40 mg total) by mouth 2 (two) times daily. 180 tablet 3  . pioglitazone (ACTOS) 30 MG tablet Take 1 tablet (30 mg total) by mouth daily. 90 tablet 3  . triamcinolone cream (KENALOG) 0.1 % Apply 1 application topically 2 (two) times daily. 30 g 0  . TRUEplus Lancets 33G MISC Use as directed to monitor FSBS 1x daily. Dx: E11.9 100 each 3  . zinc gluconate 50 MG tablet Take 50 mg by mouth daily.     No current facility-administered medications on file prior to visit.   Allergies  Allergen Reactions  . Contrast Media [Iodinated Diagnostic Agents] Nausea And Vomiting  . Penicillins Hives and Itching   Social History   Socioeconomic History  . Marital status: Widowed    Spouse name: Not on file  .  Number of children: 4  . Years of education: Not on file  . Highest education level: Not on file  Occupational History  . Occupation: Unemployed  Tobacco Use  . Smoking status: Current Every Day Smoker    Packs/day: 1.00    Years: 40.00    Pack years: 40.00    Types: Cigarettes  . Smokeless tobacco: Never Used  Substance and Sexual Activity  . Alcohol use: No  . Drug use: No  . Sexual activity: Not on file  Other Topics Concern  . Not on file  Social History Narrative  . Not on file   Social Determinants of Health   Financial Resource Strain: Not on file  Food Insecurity: Not on file  Transportation Needs: Not on file  Physical Activity: Not on file  Stress: Not on file  Social Connections: Not on file  Intimate Partner Violence: Not on file     Review of Systems  All other systems reviewed and are negative.      Objective:   Physical Exam Vitals reviewed.  Cardiovascular:     Rate and Rhythm: Normal rate and regular rhythm.     Heart sounds: Normal heart sounds.  Pulmonary:     Effort: Pulmonary effort is normal. No respiratory distress.     Breath sounds: Normal breath sounds. No wheezing or rales.  Chest:     Chest wall: No tenderness.  Abdominal:     General: Bowel sounds are normal. There is no distension.     Palpations: Abdomen is soft.     Tenderness: There is no abdominal tenderness. There is no guarding or rebound.  Musculoskeletal:     Right foot: Decreased range of motion. Tenderness and bony tenderness present. No swelling or deformity.       Feet:           Assessment & Plan:   Foot pain, right - Plan: DG Foot Complete Right, Uric acid  Pain is out of proportion to exam.  History does not signify any injury.  Therefore I would suspect gout.  Begin a prednisone taper pack.  Check uric acid level.  Obtain x-ray.  Recheck next week if not better or sooner if worsening

## 2020-05-25 LAB — URIC ACID: Uric Acid, Serum: 7.4 mg/dL — ABNORMAL HIGH (ref 2.5–7.0)

## 2020-05-27 ENCOUNTER — Telehealth: Payer: Self-pay | Admitting: Family Medicine

## 2020-05-27 NOTE — Telephone Encounter (Signed)
I left a brief message to call back.

## 2020-05-27 NOTE — Telephone Encounter (Signed)
Labs show gout.  No need to get xray.  No changes unless this becomes a recurrent problem.

## 2020-05-27 NOTE — Telephone Encounter (Signed)
The patient called back and I gave her the information. She did not have any concerns.

## 2020-05-27 NOTE — Telephone Encounter (Signed)
Patient called and left a message that she did not want to get the xray completed today since she reports the prednisone was doing well and she feels 98 percent better and she is able to walk on the foot. She wants to say thank you for all your help. Please advise.

## 2020-06-28 ENCOUNTER — Other Ambulatory Visit: Payer: Self-pay

## 2020-06-28 ENCOUNTER — Ambulatory Visit
Admission: RE | Admit: 2020-06-28 | Discharge: 2020-06-28 | Disposition: A | Discharge status: Home / Self Care | Payer: Medicare HMO | Source: Ambulatory Visit | Attending: Family Medicine | Admitting: Family Medicine

## 2020-06-28 DIAGNOSIS — Z780 Asymptomatic menopausal state: Secondary | ICD-10-CM | POA: Diagnosis not present

## 2020-06-28 DIAGNOSIS — M810 Age-related osteoporosis without current pathological fracture: Secondary | ICD-10-CM | POA: Diagnosis not present

## 2020-07-01 ENCOUNTER — Other Ambulatory Visit: Payer: Self-pay | Admitting: *Deleted

## 2020-07-01 MED ORDER — ALENDRONATE SODIUM 70 MG PO TABS: 70.0000 mg | ORAL_TABLET | ORAL | 3 refills | Status: DC

## 2020-07-03 ENCOUNTER — Other Ambulatory Visit: Payer: Self-pay | Admitting: *Deleted

## 2020-07-03 MED ORDER — ALENDRONATE SODIUM 70 MG PO TABS: 70.0000 mg | ORAL_TABLET | ORAL | 3 refills | Status: DC

## 2020-07-09 ENCOUNTER — Other Ambulatory Visit: Payer: Self-pay | Admitting: Family Medicine

## 2020-07-09 DIAGNOSIS — J019 Acute sinusitis, unspecified: Secondary | ICD-10-CM

## 2020-07-10 ENCOUNTER — Other Ambulatory Visit: Payer: Self-pay | Admitting: Family Medicine

## 2020-08-15 ENCOUNTER — Encounter: Payer: Self-pay | Admitting: Family Medicine

## 2020-08-15 ENCOUNTER — Other Ambulatory Visit: Payer: Self-pay

## 2020-08-15 ENCOUNTER — Ambulatory Visit (INDEPENDENT_AMBULATORY_CARE_PROVIDER_SITE_OTHER): Payer: Medicare HMO | Admitting: Family Medicine

## 2020-08-15 VITALS — BP 138/84 | HR 94 | Temp 98.1°F | Resp 16 | Ht 63.0 in | Wt 152.0 lb

## 2020-08-15 DIAGNOSIS — N3281 Overactive bladder: Secondary | ICD-10-CM | POA: Diagnosis not present

## 2020-08-15 DIAGNOSIS — J310 Chronic rhinitis: Secondary | ICD-10-CM

## 2020-08-15 DIAGNOSIS — J329 Chronic sinusitis, unspecified: Secondary | ICD-10-CM | POA: Diagnosis not present

## 2020-08-15 DIAGNOSIS — E119 Type 2 diabetes mellitus without complications: Secondary | ICD-10-CM

## 2020-08-15 MED ORDER — TRIAMCINOLONE ACETONIDE 0.1 % EX CREA: 1.0000 "application " | TOPICAL_CREAM | Freq: Two times a day (BID) | CUTANEOUS | 3 refills | Status: DC

## 2020-08-15 MED ORDER — CEFDINIR 300 MG PO CAPS: 300.0000 mg | ORAL_CAPSULE | Freq: Two times a day (BID) | ORAL | 0 refills | Status: DC

## 2020-08-15 MED ORDER — TRIAMCINOLONE ACETONIDE 0.1 % EX CREA: 1.0000 "application " | TOPICAL_CREAM | Freq: Two times a day (BID) | CUTANEOUS | 0 refills | Status: DC

## 2020-08-15 NOTE — Progress Notes (Signed)
Subjective:    Patient ID: Rhonda Fields, female    DOB: 04-01-1950, 70 y.o.   MRN: [id number]   Patient states that her oxybutynin is no longer working.  She is reporting increased urinary frequency and urgency but no dysuria.  At times she is having urge incontinence.  She denies any hematuria or pelvic pain or fevers or chills.  She is checking her blood pressure on a regular basis and also checking her blood sugar.  She denies any hypoglycemia.  She does report a 10-day history of pain and pressure in both maxillary sinuses, rhinorrhea, headaches, postnasal drip.  She is been trying Flonase and antihistamines without relief.  She is tender to palpation in the frontal sinuses and maxillary sinuses bilaterally   Past Medical History:  Diagnosis Date   Anxiety    Arrhythmia    Chronic headaches    Depression    Diabetes mellitus without complication ([facility])    GERD (gastroesophageal reflux disease)    Hard of hearing    IBS (irritable bowel syndrome) 1986   Past Surgical History:  Procedure Laterality Date   ABDOMINAL HYSTERECTOMY     CHOLECYSTECTOMY     ERCP  04/20/2011   Procedure: ENDOSCOPIC RETROGRADE CHOLANGIOPANCREATOGRAPHY (ERCP);  Surgeon: Inda Castle, MD;  Location: [facility];  Service: Endoscopy;  Laterality: N/A;  case is at 1430 in or   ERCP  04/20/2011   Procedure: ENDOSCOPIC RETROGRADE CHOLANGIOPANCREATOGRAPHY (ERCP);  Surgeon: Inda Castle, MD;  Location: [facility];  Service: Gastroenterology;  Laterality: N/A;   Current Outpatient Medications on File Prior to Visit  Medication Sig Dispense Refill   Alcohol Swabs (B-D SINGLE USE SWABS REGULAR) PADS USE AS DIRECTED TO MONITOR BLOOD GLUCOSE UP TO THREE TIMES DAILY 300 each 3   alendronate (FOSAMAX) 70 MG tablet Take 1 tablet (70 mg total) by mouth every 7 (seven) days. Take with a full glass of water on an empty stomach. 12 tablet 3   Ascorbic Acid (VITAMIN C) 1000 MG tablet Take 1,000 mg by mouth daily.      aspirin 81 MG chewable tablet Chew by mouth daily.     atorvastatin (LIPITOR) 40 MG tablet Take 1 tablet (40 mg total) by mouth daily. 90 tablet 3   BIOTIN 5000 PO Take by mouth.     Blood Glucose Calibration (TRUE METRIX LEVEL 1) Low SOLN Use as directed to monitor FSBS 1x daily. Dx: E11.9 100 each 3   Blood Glucose Monitoring Suppl (TRUE METRIX METER) w/Device KIT USE AS DIRECTED 1 kit 3   Cinnamon 500 MG TABS Take by mouth.     fluticasone (FLONASE) 50 MCG/ACT nasal spray USE 2 SPRAYS IN EACH NOSTRIL EVERY DAY 48 g 1   glucose blood test strip Use as instructed 100 each 3   HYDROcodone-acetaminophen (NORCO) 5-325 MG tablet Take 1 tablet by mouth every 6 (six) hours as needed for moderate pain. 30 tablet 0   losartan (COZAAR) 25 MG tablet TAKE 1 TABLET EVERY DAY (DISCONTINUE LOSARTAN 50 MG) 90 tablet 2   metFORMIN (GLUCOPHAGE) 1000 MG tablet Take 1 tablet (1,000 mg total) by mouth 2 (two) times daily with a meal. 180 tablet 3   Multiple Vitamin (MULTIVITAMIN WITH MINERALS) TABS tablet Take 1 tablet by mouth daily.     Omega-3 Fatty Acids (FISH OIL) 1200 MG CPDR Take by mouth 2 (two) times daily.     oxybutynin (DITROPAN) 5 MG tablet TAKE 1 TABLET THREE TIMES DAILY 270  tablet 0   pantoprazole (PROTONIX) 40 MG tablet Take 1 tablet (40 mg total) by mouth 2 (two) times daily. 180 tablet 3   pioglitazone (ACTOS) 30 MG tablet Take 1 tablet (30 mg total) by mouth daily. 90 tablet 3   triamcinolone cream (KENALOG) 0.1 % Apply 1 application topically 2 (two) times daily. 30 g 0   TRUEplus Lancets 33G MISC Use as directed to monitor FSBS 1x daily. Dx: E11.9 100 each 3   zinc gluconate 50 MG tablet Take 50 mg by mouth daily.     No current facility-administered medications on file prior to visit.   Allergies  Allergen Reactions   Contrast Media [Iodinated Diagnostic Agents] Nausea And Vomiting   Penicillins Hives and Itching   Social History   Socioeconomic History   Marital status: Widowed     Spouse name: Not on file   Number of children: 4   Years of education: Not on file   Highest education level: Not on file  Occupational History   Occupation: Unemployed  Tobacco Use   Smoking status: Every Day    Packs/day: 1.00    Years: 40.00    Pack years: 40.00    Types: Cigarettes   Smokeless tobacco: Never  Substance and Sexual Activity   Alcohol use: No   Drug use: No   Sexual activity: Not on file  Other Topics Concern   Not on file  Social History Narrative   Not on file   Social Determinants of Health   Financial Resource Strain: Not on file  Food Insecurity: Not on file  Transportation Needs: Not on file  Physical Activity: Not on file  Stress: Not on file  Social Connections: Not on file  Intimate Partner Violence: Not on file     Review of Systems  All other systems reviewed and are negative.     Objective:   Physical Exam Vitals reviewed.  Constitutional:      Appearance: Normal appearance. She is normal weight.  HENT:     Nose:     Right Sinus: Maxillary sinus tenderness and frontal sinus tenderness present.     Left Sinus: Maxillary sinus tenderness and frontal sinus tenderness present.  Cardiovascular:     Rate and Rhythm: Normal rate and regular rhythm.     Heart sounds: Normal heart sounds.  Pulmonary:     Effort: Pulmonary effort is normal. No respiratory distress.     Breath sounds: Normal breath sounds. No wheezing or rales.  Chest:     Chest wall: No tenderness.  Abdominal:     General: Bowel sounds are normal. There is no distension.     Palpations: Abdomen is soft.     Tenderness: There is no abdominal tenderness. There is no guarding or rebound.  Neurological:     Mental Status: She is alert.          Assessment & Plan:   Diabetes mellitus without complication ([facility])  OAB (overactive bladder)  Rhinosinusitis Patient states that she can tolerate cephalosporins.  Treat her sinusitis with Omnicef 300 mg p.o. twice  daily for 10 days.  Continue Flonase.  Patient's overactive bladder is out of control.  Switch to Toviaz 8 mg a day and recheck in 2 weeks.  If not working, I will continue the patient on the cheaper oxybutynin and try adding Myrbetriq.  Check lab work when patient is available and fasting

## 2020-08-16 ENCOUNTER — Encounter: Payer: Self-pay | Admitting: *Deleted

## 2020-09-02 ENCOUNTER — Other Ambulatory Visit: Payer: Self-pay | Admitting: Family Medicine

## 2020-10-10 ENCOUNTER — Encounter: Payer: Self-pay | Admitting: Family Medicine

## 2020-10-10 ENCOUNTER — Other Ambulatory Visit: Payer: Self-pay

## 2020-10-10 ENCOUNTER — Ambulatory Visit (INDEPENDENT_AMBULATORY_CARE_PROVIDER_SITE_OTHER): Payer: Medicare HMO | Admitting: Family Medicine

## 2020-10-10 VITALS — BP 128/64 | HR 96 | Temp 98.2°F | Resp 18 | Ht 63.0 in | Wt 155.0 lb

## 2020-10-10 DIAGNOSIS — N3281 Overactive bladder: Secondary | ICD-10-CM | POA: Diagnosis not present

## 2020-10-10 MED ORDER — OXYBUTYNIN CHLORIDE 5 MG PO TABS: 10.0000 mg | ORAL_TABLET | Freq: Three times a day (TID) | ORAL | 5 refills | Status: DC

## 2020-10-10 NOTE — Progress Notes (Signed)
Subjective:    Patient ID: Rhonda Fields, female    DOB: 03/10/1950, 70 y.o.   MRN: [id number]   Patient states that her oxybutynin 5 mg 3 times a day is no longer working.  We tried Toviaz with no relief.  Her insurance will not cover any other anticholinergic medication.  She is here today to discuss other options   Past Medical History:  Diagnosis Date   Anxiety    Arrhythmia    Chronic headaches    Depression    Diabetes mellitus without complication (HCC)    GERD (gastroesophageal reflux disease)    Hard of hearing    IBS (irritable bowel syndrome) 1986   Past Surgical History:  Procedure Laterality Date   ABDOMINAL HYSTERECTOMY     CHOLECYSTECTOMY     ERCP  04/20/2011   Procedure: ENDOSCOPIC RETROGRADE CHOLANGIOPANCREATOGRAPHY (ERCP);  Surgeon: Inda Castle, MD;  Location: [facility];  Service: Endoscopy;  Laterality: N/A;  case is at 1430 in or   ERCP  04/20/2011   Procedure: ENDOSCOPIC RETROGRADE CHOLANGIOPANCREATOGRAPHY (ERCP);  Surgeon: Inda Castle, MD;  Location: [facility];  Service: Gastroenterology;  Laterality: N/A;   Current Outpatient Medications on File Prior to Visit  Medication Sig Dispense Refill   Alcohol Swabs (B-D SINGLE USE SWABS REGULAR) PADS USE AS DIRECTED TO MONITOR BLOOD GLUCOSE UP TO THREE TIMES DAILY 300 each 3   alendronate (FOSAMAX) 70 MG tablet Take 1 tablet (70 mg total) by mouth every 7 (seven) days. Take with a full glass of water on an empty stomach. 12 tablet 3   Ascorbic Acid (VITAMIN C) 1000 MG tablet Take 1,000 mg by mouth daily.     aspirin 81 MG chewable tablet Chew by mouth daily.     atorvastatin (LIPITOR) 40 MG tablet TAKE 1 TABLET EVERY DAY 90 tablet 3   BIOTIN 5000 PO Take by mouth.     Blood Glucose Calibration (TRUE METRIX LEVEL 1) Low SOLN Use as directed to monitor FSBS 1x daily. Dx: E11.9 100 each 3   Blood Glucose Monitoring Suppl (TRUE METRIX METER) w/Device KIT USE AS DIRECTED 1 kit 3   Calcium Carbonate-Vit  D-Min (CALCIUM 1200) 1200-1000 MG-UNIT CHEW Chew by mouth.     Cinnamon 500 MG TABS Take by mouth.     fluticasone (FLONASE) 50 MCG/ACT nasal spray USE 2 SPRAYS IN EACH NOSTRIL EVERY DAY 48 g 1   glucose blood test strip Use as instructed 100 each 3   HYDROcodone-acetaminophen (NORCO) 5-325 MG tablet Take 1 tablet by mouth every 6 (six) hours as needed for moderate pain. 30 tablet 0   losartan (COZAAR) 25 MG tablet TAKE 1 TABLET EVERY DAY (DISCONTINUE LOSARTAN 50 MG) 90 tablet 2   metFORMIN (GLUCOPHAGE) 1000 MG tablet Take 1 tablet (1,000 mg total) by mouth 2 (two) times daily with a meal. 180 tablet 3   Multiple Vitamin (MULTIVITAMIN WITH MINERALS) TABS tablet Take 1 tablet by mouth daily.     Omega-3 Fatty Acids (FISH OIL) 1200 MG CPDR Take by mouth 2 (two) times daily.     oxybutynin (DITROPAN) 5 MG tablet TAKE 1 TABLET THREE TIMES DAILY 270 tablet 0   pantoprazole (PROTONIX) 40 MG tablet Take 1 tablet (40 mg total) by mouth 2 (two) times daily. 180 tablet 3   pioglitazone (ACTOS) 30 MG tablet Take 1 tablet (30 mg total) by mouth daily. 90 tablet 3   triamcinolone cream (KENALOG) 0.1 % Apply 1 application topically  2 (two) times daily. 30 g 3   TRUEplus Lancets 33G MISC Use as directed to monitor FSBS 1x daily. Dx: E11.9 100 each 3   zinc gluconate 50 MG tablet Take 50 mg by mouth daily.     No current facility-administered medications on file prior to visit.   Allergies  Allergen Reactions   Contrast Media [Iodinated Diagnostic Agents] Nausea And Vomiting   Penicillins Hives and Itching   Social History   Socioeconomic History   Marital status: Widowed    Spouse name: Not on file   Number of children: 4   Years of education: Not on file   Highest education level: Not on file  Occupational History   Occupation: Unemployed  Tobacco Use   Smoking status: Every Day    Packs/day: 1.00    Years: 40.00    Pack years: 40.00    Types: Cigarettes   Smokeless tobacco: Never   Substance and Sexual Activity   Alcohol use: No   Drug use: No   Sexual activity: Not on file  Other Topics Concern   Not on file  Social History Narrative   Not on file   Social Determinants of Health   Financial Resource Strain: Not on file  Food Insecurity: Not on file  Transportation Needs: Not on file  Physical Activity: Not on file  Stress: Not on file  Social Connections: Not on file  Intimate Partner Violence: Not on file     Review of Systems  All other systems reviewed and are negative.     Objective:   Physical Exam Vitals reviewed.  Constitutional:      Appearance: Normal appearance. She is normal weight.  Cardiovascular:     Rate and Rhythm: Normal rate and regular rhythm.     Heart sounds: Normal heart sounds.  Pulmonary:     Effort: Pulmonary effort is normal. No respiratory distress.     Breath sounds: Normal breath sounds. No wheezing or rales.  Chest:     Chest wall: No tenderness.  Abdominal:     General: Bowel sounds are normal. There is no distension.     Palpations: Abdomen is soft.     Tenderness: There is no abdominal tenderness. There is no guarding or rebound.  Neurological:     Mental Status: She is alert.          Assessment & Plan:   OAB (overactive bladder) Increase oxybutynin to 5 mg twice a day and 10 mg at night which will be 20 mg the maximum dose.  After discussing the situation further with the patient, she can cautiously increase to 10 mg 3 times a day gradually over a period of 2 to 3 weeks.  We warned her about side effects including constipation, dry mouth, and confusion.  As long as she does not experience that she can slowly increase oxybutynin to this dose.  If this does not work I will try Myrbetriq 25 mg daily.  If that does not work I would recommend urology consultation.

## 2020-11-07 ENCOUNTER — Other Ambulatory Visit: Payer: Self-pay | Admitting: Family Medicine

## 2020-11-08 ENCOUNTER — Other Ambulatory Visit: Payer: Self-pay | Admitting: *Deleted

## 2020-11-08 MED ORDER — OXYBUTYNIN CHLORIDE ER 10 MG PO TB24: 10.0000 mg | ORAL_TABLET | Freq: Three times a day (TID) | ORAL | 3 refills | Status: DC

## 2020-11-13 ENCOUNTER — Telehealth: Payer: Self-pay | Admitting: *Deleted

## 2020-11-13 MED ORDER — OXYBUTYNIN CHLORIDE ER 10 MG PO TB24: 10.0000 mg | ORAL_TABLET | Freq: Two times a day (BID) | ORAL | 3 refills | Status: DC

## 2020-11-13 NOTE — Telephone Encounter (Signed)
Received request from pharmacy for PA on Oxybutynin.   PA submitted.   Dx: N32.81- OAB  Your information has been submitted to [facility]. [employer] will review the request and will issue a decision, typically within 3-7 days from your submission. You can check the updated outcome later by reopening this request.  If [employer] has not responded in 3-7 days or if you have any questions about your ePA request, please contact [employer] at [phone]. If you think there may be a problem with your PA request, use our live chat feature at the bottom right.  For [place] requests, please call [phone].

## 2020-11-14 ENCOUNTER — Ambulatory Visit: Payer: Medicare HMO

## 2020-11-14 MED ORDER — OXYBUTYNIN CHLORIDE ER 10 MG PO TB24: 10.0000 mg | ORAL_TABLET | Freq: Two times a day (BID) | ORAL | 3 refills | Status: DC

## 2020-11-14 NOTE — Telephone Encounter (Signed)
Received PA determination.   PA Case: [id number] approved 01/20/2020- 01/18/2022.  Pharmacy made aware.

## 2020-11-14 NOTE — Addendum Note (Signed)
Addended by: Sheral Flow on: 11/14/2020 01:41 PM   Modules accepted: Orders

## 2020-11-15 ENCOUNTER — Other Ambulatory Visit: Payer: Self-pay | Admitting: *Deleted

## 2020-11-15 MED ORDER — OXYBUTYNIN CHLORIDE ER 10 MG PO TB24: 10.0000 mg | ORAL_TABLET | Freq: Two times a day (BID) | ORAL | 3 refills | Status: DC

## 2020-11-19 ENCOUNTER — Telehealth: Payer: Self-pay | Admitting: Family Medicine

## 2020-11-19 DIAGNOSIS — N3281 Overactive bladder: Secondary | ICD-10-CM

## 2020-11-19 MED ORDER — OXYBUTYNIN CHLORIDE 5 MG PO TABS: 5.0000 mg | ORAL_TABLET | Freq: Four times a day (QID) | ORAL | 5 refills | Status: DC

## 2020-11-19 NOTE — Telephone Encounter (Signed)
Rx changed and sent to pharmacy per pt request. Pt advised she can take 2 tabs BID. Pt voiced understanding. Nothing further needed.

## 2020-11-19 NOTE — Telephone Encounter (Signed)
Patient called to request adjustment to Rx for oxybutynin oxybutynin   Medication currently costs $80. Patient states it will be free of charge to her if script is revised for 5mg  4 times a day.   Pharmacy confirmed as   [facility]  [place], [place] [address]  Phone:  [phone]  Fax:  [phone]   Script not due for refill just yet; patient being proactive. Please advise at [phone].

## 2020-11-27 ENCOUNTER — Other Ambulatory Visit: Payer: Self-pay | Admitting: Family Medicine

## 2020-12-07 ENCOUNTER — Other Ambulatory Visit: Payer: Self-pay | Admitting: Family Medicine

## 2020-12-07 DIAGNOSIS — E118 Type 2 diabetes mellitus with unspecified complications: Secondary | ICD-10-CM

## 2020-12-15 ENCOUNTER — Other Ambulatory Visit: Payer: Self-pay | Admitting: Family Medicine

## 2020-12-15 DIAGNOSIS — E119 Type 2 diabetes mellitus without complications: Secondary | ICD-10-CM

## 2021-01-20 ENCOUNTER — Other Ambulatory Visit: Payer: Self-pay | Admitting: Family Medicine

## 2021-02-28 ENCOUNTER — Telehealth: Payer: Self-pay

## 2021-02-28 ENCOUNTER — Other Ambulatory Visit: Payer: Self-pay

## 2021-02-28 ENCOUNTER — Ambulatory Visit (INDEPENDENT_AMBULATORY_CARE_PROVIDER_SITE_OTHER): Payer: Medicare HMO

## 2021-02-28 VITALS — Ht 63.0 in | Wt 155.0 lb

## 2021-02-28 DIAGNOSIS — Z0000 Encounter for general adult medical examination without abnormal findings: Secondary | ICD-10-CM

## 2021-02-28 DIAGNOSIS — Z1211 Encounter for screening for malignant neoplasm of colon: Secondary | ICD-10-CM | POA: Diagnosis not present

## 2021-02-28 NOTE — Progress Notes (Signed)
Subjective:   Rhonda Fields is a 71 y.o. female who presents for an Initial Medicare Annual Wellness Visit. Virtual Visit via Telephone Note  I connected with  Rhonda Fields on 02/28/21 at  8:15 AM EST by telephone and verified that I am speaking with the correct person using two identifiers.  Location: Patient: HOME Provider: [facility] Persons participating in the virtual visit: patient/Nurse Health Advisor   I discussed the limitations, risks, security and privacy concerns of performing an evaluation and management service by telephone and the availability of in person appointments. The patient expressed understanding and agreed to proceed.  Interactive audio and video telecommunications were attempted between this nurse and patient, however failed, due to patient having technical difficulties OR patient did not have access to video capability.  We continued and completed visit with audio only.  Some vital signs may be absent or patient reported.   Chriss Driver, LPN  Review of Systems     Cardiac Risk Factors include: advanced age (>52mn, >>49women);diabetes mellitus;hypertension;smoking/ tobacco exposure;sedentary lifestyle  PHONE VISIT. PT AT HOME. NURSE AT [facility]    Objective:    Today's Vitals   02/28/21 0817 02/28/21 0822  Weight: 155 lb (70.3 kg)   Height: '5\' 3"'  (1.6 m)   PainSc:  7    Body mass index is 27.46 kg/m.  Advanced Directives 02/28/2021 04/20/2011  Does Patient Have a Medical Advance Directive? No Patient does not have advance directive  Would patient like information on creating a medical advance directive? No - Patient declined -  Pre-existing out of facility DNR order (yellow form or pink MOST form) - No    Current Medications (verified) Outpatient Encounter Medications as of 02/28/2021  Medication Sig   Alcohol Swabs (DROPSAFE ALCOHOL PREP) 70 % PADS USE AS DIRECTED TO MONITOR BLOOD GLUCOSE UP TO THREE TIMES DAILY   alendronate  (FOSAMAX) 70 MG tablet Take 1 tablet (70 mg total) by mouth every 7 (seven) days. Take with a full glass of water on an empty stomach.   Ascorbic Acid (VITAMIN C) 1000 MG tablet Take 1,000 mg by mouth daily.   aspirin 81 MG chewable tablet Chew by mouth daily.   atorvastatin (LIPITOR) 40 MG tablet TAKE 1 TABLET EVERY DAY   BIOTIN 5000 PO Take by mouth.   Blood Glucose Calibration (TRUE METRIX LEVEL 1) Low SOLN Use as directed to monitor FSBS 1x daily. Dx: E11.9   Blood Glucose Monitoring Suppl (TRUE METRIX METER) w/Device KIT USE AS DIRECTED   Calcium Carbonate-Vit D-Min (CALCIUM 1200) 1200-1000 MG-UNIT CHEW Chew by mouth.   Cinnamon 500 MG TABS Take by mouth.   fluticasone (FLONASE) 50 MCG/ACT nasal spray USE 2 SPRAYS IN EACH NOSTRIL EVERY DAY   glucose blood test strip Use as instructed   HYDROcodone-acetaminophen (NORCO) 5-325 MG tablet Take 1 tablet by mouth every 6 (six) hours as needed for moderate pain.   losartan (COZAAR) 25 MG tablet TAKE 1 TABLET EVERY DAY (DISCONTINUE LOSARTAN 50 MG)   metFORMIN (GLUCOPHAGE) 1000 MG tablet TAKE 1 TABLET TWICE DAILY WITH A MEAL   Multiple Vitamin (MULTIVITAMIN WITH MINERALS) TABS tablet Take 1 tablet by mouth daily.   Omega-3 Fatty Acids (FISH OIL) 1200 MG CPDR Take by mouth 2 (two) times daily.   oxybutynin (DITROPAN) 5 MG tablet Take 1 tablet (5 mg total) by mouth 4 (four) times daily.   pantoprazole (PROTONIX) 40 MG tablet TAKE 1 TABLET TWICE DAILY   pioglitazone (ACTOS)  30 MG tablet TAKE 1 TABLET EVERY DAY   triamcinolone cream (KENALOG) 0.1 % APPLY 1 APPLICATION TOPICALLY TWICE DAILY   TRUEplus Lancets 33G MISC Use as directed to monitor FSBS 1x daily. Dx: E11.9   zinc gluconate 50 MG tablet Take 50 mg by mouth daily.   No facility-administered encounter medications on file as of 02/28/2021.    Allergies (verified) Contrast media [iodinated contrast media] and Penicillins   History: Past Medical History:  Diagnosis Date   Anxiety     Arrhythmia    Chronic headaches    Depression    Diabetes mellitus without complication (HCC)    GERD (gastroesophageal reflux disease)    Hard of hearing    IBS (irritable bowel syndrome) 1986   Past Surgical History:  Procedure Laterality Date   ABDOMINAL HYSTERECTOMY     CHOLECYSTECTOMY     ERCP  04/20/2011   Procedure: ENDOSCOPIC RETROGRADE CHOLANGIOPANCREATOGRAPHY (ERCP);  Surgeon: Inda Castle, MD;  Location: [facility];  Service: Endoscopy;  Laterality: N/A;  case is at 1430 in or   ERCP  04/20/2011   Procedure: ENDOSCOPIC RETROGRADE CHOLANGIOPANCREATOGRAPHY (ERCP);  Surgeon: Inda Castle, MD;  Location: [facility];  Service: Gastroenterology;  Laterality: N/A;   Family History  Adopted: Yes  Problem Relation Age of Onset   Diabetes Other        Siblings, both sets of grandparents   Liver disease Mother    Kidney disease Mother    Liver disease Brother    Malignant hyperthermia Neg Hx    Social History   Socioeconomic History   Marital status: Widowed    Spouse name: Not on file   Number of children: 4   Years of education: Not on file   Highest education level: Not on file  Occupational History   Occupation: Unemployed  Tobacco Use   Smoking status: Every Day    Packs/day: 1.00    Years: 40.00    Pack years: 40.00    Types: Cigarettes   Smokeless tobacco: Never  Substance and Sexual Activity   Alcohol use: No   Drug use: No   Sexual activity: Not on file  Other Topics Concern   Not on file  Social History Narrative   Daughter lives next door.   Widowed. Was married x 37 years.   Social Determinants of Health   Financial Resource Strain: Low Risk    Difficulty of Paying Living Expenses: Not very hard  Food Insecurity: No Food Insecurity   Worried About Charity fundraiser in the Last Year: Never true   Ran Out of Food in the Last Year: Never true  Transportation Needs: No Transportation Needs   Lack of Transportation (Medical): No   Lack of  Transportation (Non-Medical): No  Physical Activity: Insufficiently Active   Days of Exercise per Week: 2 days   Minutes of Exercise per Session: 10 min  Stress: No Stress Concern Present   Feeling of Stress : Only a little  Social Connections: Socially Isolated   Frequency of Communication with Friends and Family: More than three times a week   Frequency of Social Gatherings with Friends and Family: More than three times a week   Attends Religious Services: Never   Marine scientist or Organizations: No   Attends Archivist Meetings: Never   Marital Status: Divorced    Tobacco Counseling Ready to quit: Not Answered Counseling given: Not Answered   Clinical Intake:  Pre-visit preparation completed:  Yes  Pain : 0-10 Pain Score: 7  Pain Type: Chronic pain Pain Location: Back Pain Descriptors / Indicators: Aching, Nagging Pain Onset: More than a month ago Pain Frequency: Intermittent     BMI - recorded: 27.46 Nutritional Status: BMI 25 -29 Overweight Nutritional Risks: None Diabetes: Yes  How often do you need to have someone help you when you read instructions, pamphlets, or other written materials from your doctor or pharmacy?: 1 - Never  Diabetic?Nutrition Risk Assessment:  Has the patient had any N/V/D within the last 2 months?  No  Does the patient have any non-healing wounds?  No  Has the patient had any unintentional weight loss or weight gain?  No   Diabetes:  Is the patient diabetic?  Yes  If diabetic, was a CBG obtained today?  No  Did the patient bring in their glucometer from home?  No  Phone visit. How often do you monitor your CBG's? Daily.   Financial Strains and Diabetes Management:  Are you having any financial strains with the device, your supplies or your medication? No .  Does the patient want to be seen by Chronic Care Management for management of their diabetes?  No  Would the patient like to be referred to a Nutritionist or  for Diabetic Management?  No   Diabetic Exams:  Diabetic Eye Exam: Completed Appt scheduled for 03/2020. Pt has been advised about the importance in completing this exam.  Diabetic Foot Exam: Completed 08/10/2019. Pt has been advised about the importance in completing this exam.   Interpreter Needed?: No  Information entered by :: mj Elleanna Melling, lpn   Activities of Daily Living In your present state of health, do you have any difficulty performing the following activities: 02/28/2021  Hearing? N  Vision? N  Difficulty concentrating or making decisions? N  Walking or climbing stairs? N  Dressing or bathing? N  Doing errands, shopping? N  Preparing Food and eating ? N  Using the Toilet? N  In the past six months, have you accidently leaked urine? Y  Comment Currently on medication  Do you have problems with loss of bowel control? Y  Comment Due to Fosamax.  Managing your Medications? N  Managing your Finances? N  Housekeeping or managing your Housekeeping? N  Some recent data might be hidden    Patient Care Team: Susy Frizzle, MD as PCP - General (Family Medicine) Edythe Clarity, [facility] as Pharmacist (Pharmacist)  Indicate any recent Medical Services you may have received from other than [facility] providers in the past year (date may be approximate).     Assessment:   This is a routine wellness examination for [place].  Hearing/Vision screen Hearing Screening - Comments:: Hearing issues to L ear.  Vision Screening - Comments:: Glasses.   Dietary issues and exercise activities discussed: Current Exercise Habits: The patient does not participate in regular exercise at present, Exercise limited by: cardiac condition(s);orthopedic condition(s)   Goals Addressed             This Visit's Progress    Exercise 3x per week (30 min per time)       Try to move more, gentle stretching and chair exercises.        Depression Screen PHQ 2/9 Scores 02/28/2021 05/14/2020  03/29/2017 04/17/2013 02/15/2013  PHQ - 2 Score '1 4 2 ' 0 1  PHQ- 9 Score - 15 6 - -    Fall Risk Fall Risk  02/28/2021 05/14/2020 03/29/2017 04/17/2013 02/15/2013  Falls in the past year? 1 0 No No Yes  Number falls in past yr: 0 - - - 1  Injury with Fall? 0 - - - Yes  Risk Factor Category  - - - - (No Data)  Comment - - - - Fell at [employer], circumstances unknown  Risk for fall due to : Impaired balance/gait;History of fall(s) No Fall Risks - - History of fall(s)  Follow up Falls prevention discussed Falls evaluation completed - - -    FALL RISK PREVENTION PERTAINING TO THE HOME:  Any stairs in or around the home? Yes  If so, are there any without handrails? No  Home free of loose throw rugs in walkways, pet beds, electrical cords, etc? Yes  Adequate lighting in your home to reduce risk of falls? Yes   ASSISTIVE DEVICES UTILIZED TO PREVENT FALLS:  Life alert? No  Use of a cane, walker or w/c? Yes  Grab bars in the bathroom? No  Shower chair or bench in shower? No  Elevated toilet seat or a handicapped toilet? No   TIMED UP AND GO:  Was the test performed? No .  Phone visit.   Cognitive Function:     6CIT Screen 02/28/2021  What Year? 0 points  What month? 0 points  What time? 0 points  Count back from 20 0 points  Months in reverse 0 points  Repeat phrase 2 points  Total Score 2    Immunizations Immunization History  Administered Date(s) Administered   PFIZER(Purple Top)SARS-COV-2 Vaccination 03/15/2019, 04/12/2019   Pneumococcal Polysaccharide-23 07/30/2014, 08/10/2019    TDAP status: Due, Education has been provided regarding the importance of this vaccine. Advised may receive this vaccine at local pharmacy or Health Dept. Aware to provide a copy of the vaccination record if obtained from local pharmacy or Health Dept. Verbalized acceptance and understanding.  Flu Vaccine status: Declined, Education has been provided regarding the importance of this vaccine but patient  still declined. Advised may receive this vaccine at local pharmacy or Health Dept. Aware to provide a copy of the vaccination record if obtained from local pharmacy or Health Dept. Verbalized acceptance and understanding.  Pneumococcal vaccine status: Up to date  Covid-19 vaccine status: Completed vaccines  Qualifies for Shingles Vaccine? Yes   Zostavax completed No   Shingrix Completed?: No.    Education has been provided regarding the importance of this vaccine. Patient has been advised to call insurance company to determine out of pocket expense if they have not yet received this vaccine. Advised may also receive vaccine at local pharmacy or Health Dept. Verbalized acceptance and understanding.  Screening Tests Health Maintenance  Topic Date Due   TETANUS/TDAP  Never done   MAMMOGRAM  Never done   Zoster Vaccines- Shingrix (1 of 2) Never done   COLONOSCOPY (Pts 45-24yr Insurance coverage will need to be confirmed)  05/04/2014   COVID-19 Vaccine (3 - Booster for Pfizer series) 06/07/2019   Pneumonia Vaccine 70 Years old (2 - PCV) 08/09/2020   FOOT EXAM  08/09/2020   INFLUENZA VACCINE  Never done   HEMOGLOBIN A1C  11/12/2020   OPHTHALMOLOGY EXAM  12/25/2020   DEXA SCAN  Completed   Hepatitis C Screening  Completed   HPV VACCINES  Aged Out    Health Maintenance  Health Maintenance Due  Topic Date Due   TETANUS/TDAP  Never done   MAMMOGRAM  Never done   Zoster Vaccines- Shingrix (1 of 2) Never done   COLONOSCOPY (Pts  45-61yr Insurance coverage will need to be confirmed)  05/04/2014   COVID-19 Vaccine (3 - Booster for Pfizer series) 06/07/2019   Pneumonia Vaccine 70 Years old (2 - PCV) 08/09/2020   FOOT EXAM  08/09/2020   INFLUENZA VACCINE  Never done   HEMOGLOBIN A1C  11/12/2020   OPHTHALMOLOGY EXAM  12/25/2020    Colorectal cancer screening: Type of screening: Colonoscopy. Completed 05/04/2011. Repeat every 3 years  Mammogram status: No longer required due to PT  DECLINED.  Bone Density status: Completed 06/28/2020. Results reflect: Bone density results: OSTEOPOROSIS. Repeat every 2 years.  Lung Cancer Screening: (Low Dose CT Chest recommended if Age 70-80years, 30 pack-year currently smoking OR have quit w/in 15years.) does qualify.   Lung Cancer Screening Referral: Pt declined at this time.  Additional Screening:  Hepatitis C Screening: does qualify; Completed 01/30/2013  Vision Screening: Recommended annual ophthalmology exams for early detection of glaucoma and other disorders of the eye. Is the patient up to date with their annual eye exam?  Yes  Who is the provider or what is the name of the office in which the patient attends annual eye exams? [facility] [place] pt is not established with a provider, would they like to be referred to a provider to establish care? No .   Dental Screening: Recommended annual dental exams for proper oral hygiene  Community Resource Referral / Chronic Care Management: CRR required this visit?  No   CCM required this visit?  No      Plan:     I have personally reviewed and noted the following in the patients chart:   Medical and social history Use of alcohol, tobacco or illicit drugs  Current medications and supplements including opioid prescriptions. Patient is not currently taking opioid prescriptions. Functional ability and status Nutritional status Physical activity Advanced directives List of other physicians Hospitalizations, surgeries, and ER visits in previous 12 months Vitals Screenings to include cognitive, depression, and falls Referrals and appointments  In addition, I have reviewed and discussed with patient certain preventive protocols, quality metrics, and best practice recommendations. A written personalized care plan for preventive services as well as general preventive health recommendations were provided to patient.     MChriss Driver LPN   [id number]  Nurse Notes:  Discussed Colonoscopy. Pt would like to repeat. Order placed. Declined Mammogram. Discussed Shingrix and how to obtain. Overdue for diabetic foot exam.

## 2021-02-28 NOTE — Patient Instructions (Signed)
Rhonda Fields , Thank you for taking time to come for your Medicare Wellness Visit. I appreciate your ongoing commitment to your health goals. Please review the following plan we discussed and let me know if I can assist you in the future.   Screening recommendations/referrals: Colonoscopy: Done 05/04/2011. Order placed today. Mammogram: Declined.  Bone Density: Done 06/28/2020 Repeat every 2 years  Recommended yearly ophthalmology/optometry visit for glaucoma screening and checkup Recommended yearly dental visit for hygiene and checkup  Vaccinations: Influenza vaccine: Declined. Pneumococcal vaccine: Done 08/10/2019 and 07/30/2014 Tdap vaccine: Due Repeat in 10 years  Shingles vaccine: Discussed. Call local pharmacy for vaccine administration.   Covid-19:Done 02/23/2019 and 04/12/2019  Advanced directives: Advance directive discussed with you today. Even though you declined this today, please call our office should you change your mind, and we can give you the proper paperwork for you to fill out.   Conditions/risks identified: Aim for 30 minutes of exercise or walking each day, drink 6-8 glasses of water and eat lots of fruits and vegetables.   Next appointment: Follow up in one year for your annual wellness visit 2024.   Preventive Care 82 Years and Older, Female Preventive care refers to lifestyle choices and visits with your health care provider that can promote health and wellness. What does preventive care include? A yearly physical exam. This is also called an annual well check. Dental exams once or twice a year. Routine eye exams. Ask your health care provider how often you should have your eyes checked. Personal lifestyle choices, including: Daily care of your teeth and gums. Regular physical activity. Eating a healthy diet. Avoiding tobacco and drug use. Limiting alcohol use. Practicing safe sex. Taking low-dose aspirin every day. Taking vitamin and mineral supplements  as recommended by your health care provider. What happens during an annual well check? The services and screenings done by your health care provider during your annual well check will depend on your age, overall health, lifestyle risk factors, and family history of disease. Counseling  Your health care provider may ask you questions about your: Alcohol use. Tobacco use. Drug use. Emotional well-being. Home and relationship well-being. Sexual activity. Eating habits. History of falls. Memory and ability to understand (cognition). Work and work Statistician. Reproductive health. Screening  You may have the following tests or measurements: Height, weight, and BMI. Blood pressure. Lipid and cholesterol levels. These may be checked every 5 years, or more frequently if you are over 58 years old. Skin check. Lung cancer screening. You may have this screening every year starting at age 58 if you have a 30-pack-year history of smoking and currently smoke or have quit within the past 15 years. Fecal occult blood test (FOBT) of the stool. You may have this test every year starting at age 53. Flexible sigmoidoscopy or colonoscopy. You may have a sigmoidoscopy every 5 years or a colonoscopy every 10 years starting at age 9. Hepatitis C blood test. Hepatitis B blood test. Sexually transmitted disease (STD) testing. Diabetes screening. This is done by checking your blood sugar (glucose) after you have not eaten for a while (fasting). You may have this done every 1-3 years. Bone density scan. This is done to screen for osteoporosis. You may have this done starting at age 67. Mammogram. This may be done every 1-2 years. Talk to your health care provider about how often you should have regular mammograms. Talk with your health care provider about your test results, treatment options, and if necessary, the  need for more tests. Vaccines  Your health care provider may recommend certain vaccines, such  as: Influenza vaccine. This is recommended every year. Tetanus, diphtheria, and acellular pertussis (Tdap, Td) vaccine. You may need a Td booster every 10 years. Zoster vaccine. You may need this after age 20. Pneumococcal 13-valent conjugate (PCV13) vaccine. One dose is recommended after age 105. Pneumococcal polysaccharide (PPSV23) vaccine. One dose is recommended after age 44. Talk to your health care provider about which screenings and vaccines you need and how often you need them. This information is not intended to replace advice given to you by your health care provider. Make sure you discuss any questions you have with your health care provider. Document Released: 02/01/2015 Document Revised: 09/25/2015 Document Reviewed: 11/06/2014 Elsevier Interactive Patient Education  2017 [place] Prevention in the Home Falls can cause injuries. They can happen to people of all ages. There are many things you can do to make your home safe and to help prevent falls. What can I do on the outside of my home? Regularly fix the edges of walkways and driveways and fix any cracks. Remove anything that might make you trip as you walk through a door, such as a raised step or threshold. Trim any bushes or trees on the path to your home. Use bright outdoor lighting. Clear any walking paths of anything that might make someone trip, such as rocks or tools. Regularly check to see if handrails are loose or broken. Make sure that both sides of any steps have handrails. Any raised decks and porches should have guardrails on the edges. Have any leaves, snow, or ice cleared regularly. Use sand or salt on walking paths during winter. Clean up any spills in your garage right away. This includes oil or grease spills. What can I do in the bathroom? Use night lights. Install grab bars by the toilet and in the tub and shower. Do not use towel bars as grab bars. Use non-skid mats or decals in the tub or  shower. If you need to sit down in the shower, use a plastic, non-slip stool. Keep the floor dry. Clean up any water that spills on the floor as soon as it happens. Remove soap buildup in the tub or shower regularly. Attach bath mats securely with double-sided non-slip rug tape. Do not have throw rugs and other things on the floor that can make you trip. What can I do in the bedroom? Use night lights. Make sure that you have a light by your bed that is easy to reach. Do not use any sheets or blankets that are too big for your bed. They should not hang down onto the floor. Have a firm chair that has side arms. You can use this for support while you get dressed. Do not have throw rugs and other things on the floor that can make you trip. What can I do in the kitchen? Clean up any spills right away. Avoid walking on wet floors. Keep items that you use a lot in easy-to-reach places. If you need to reach something above you, use a strong step stool that has a grab bar. Keep electrical cords out of the way. Do not use floor polish or wax that makes floors slippery. If you must use wax, use non-skid floor wax. Do not have throw rugs and other things on the floor that can make you trip. What can I do with my stairs? Do not leave any items on the  stairs. Make sure that there are handrails on both sides of the stairs and use them. Fix handrails that are broken or loose. Make sure that handrails are as long as the stairways. Check any carpeting to make sure that it is firmly attached to the stairs. Fix any carpet that is loose or worn. Avoid having throw rugs at the top or bottom of the stairs. If you do have throw rugs, attach them to the floor with carpet tape. Make sure that you have a light switch at the top of the stairs and the bottom of the stairs. If you do not have them, ask someone to add them for you. What else can I do to help prevent falls? Wear shoes that: Do not have high heels. Have  rubber bottoms. Are comfortable and fit you well. Are closed at the toe. Do not wear sandals. If you use a stepladder: Make sure that it is fully opened. Do not climb a closed stepladder. Make sure that both sides of the stepladder are locked into place. Ask someone to hold it for you, if possible. Clearly mark and make sure that you can see: Any grab bars or handrails. First and last steps. Where the edge of each step is. Use tools that help you move around (mobility aids) if they are needed. These include: Canes. Walkers. Scooters. Crutches. Turn on the lights when you go into a dark area. Replace any light bulbs as soon as they burn out. Set up your furniture so you have a clear path. Avoid moving your furniture around. If any of your floors are uneven, fix them. If there are any pets around you, be aware of where they are. Review your medicines with your doctor. Some medicines can make you feel dizzy. This can increase your chance of falling. Ask your doctor what other things that you can do to help prevent falls. This information is not intended to replace advice given to you by your health care provider. Make sure you discuss any questions you have with your health care provider. Document Released: 11/01/2008 Document Revised: 06/13/2015 Document Reviewed: 02/09/2014 Elsevier Interactive Patient Education  2017 [employer].

## 2021-02-28 NOTE — Telephone Encounter (Signed)
Pt asks if she can take her Fosamax at bedtime to help reduce the issues she has been having with diarrhea after taking the medication? I advised her that it is recommended to be on an empty stomach but I would check. Thanks.

## 2021-03-07 ENCOUNTER — Other Ambulatory Visit: Payer: Self-pay | Admitting: Family Medicine

## 2021-03-07 DIAGNOSIS — J019 Acute sinusitis, unspecified: Secondary | ICD-10-CM

## 2021-04-16 ENCOUNTER — Other Ambulatory Visit: Payer: Self-pay | Admitting: Family Medicine

## 2021-04-23 ENCOUNTER — Other Ambulatory Visit: Payer: Self-pay | Admitting: Family Medicine

## 2021-04-23 DIAGNOSIS — N3281 Overactive bladder: Secondary | ICD-10-CM

## 2021-07-30 ENCOUNTER — Other Ambulatory Visit: Payer: Self-pay | Admitting: Family Medicine

## 2021-07-30 NOTE — Telephone Encounter (Signed)
Requested medication (s) are due for refill today: yes  Requested medication (s) are on the active medication list: yes  Last refill:  07/03/20 #12/3  Future visit scheduled: yes  Notes to clinic:  Unable to refill per protocol due to failed labs, no updated results.    Requested Prescriptions  Pending Prescriptions Disp Refills   alendronate (FOSAMAX) 70 MG tablet [Pharmacy Med Name: ALENDRONATE SODIUM 70 MG Tablet] 12 tablet 3    Sig: TAKE 1 TABLET EVERY 7 DAYS WITH A FULL GLASS OF WATER ON AN EMPTY STOMACH     Endocrinology:  Bisphosphonates Failed - 07/30/2021  3:12 PM      Failed - Ca in normal range and within 360 days    Calcium  Date Value Ref Range Status  05/13/2020 9.3 8.6 - 10.4 mg/dL Final         Failed - Vitamin D in normal range and within 360 days    No results found for: "MO7078ML5", "QG9201EO7", "VD125OH2TOT", "25OHVITD3", "25OHVITD2", "25OHVITD1", "VD25OH"       Failed - Cr in normal range and within 360 days    Creat  Date Value Ref Range Status  05/13/2020 1.04 (H) 0.50 - 0.99 mg/dL Final    Comment:    For patients >58 years of age, the reference limit for Creatinine is approximately 13% higher for people identified as African-American. .    Creatinine, Urine  Date Value Ref Range Status  05/13/2020 33 20 - 275 mg/dL Final         Failed - Mg Level in normal range and within 360 days    No results found for: "MG"       Failed - Phosphate in normal range and within 360 days    No results found for: "PHOS"       Failed - eGFR is 30 or above and within 360 days    GFR, Est African American  Date Value Ref Range Status  05/13/2020 64 > OR = 60 mL/min/1.98m Final   GFR, Est Non African American  Date Value Ref Range Status  05/13/2020 55 (L) > OR = 60 mL/min/1.740mFinal   GFR  Date Value Ref Range Status  04/27/2011 83.88 >60.00 mL/min Final         Passed - Valid encounter within last 12 months    Recent Outpatient Visits            9 months ago OAB (overactive bladder)   [facility]   11 months [facility] [place] [place]   1 year ago Foot pain, right   [place] [place]   [facility] [place] [place]   1 year ago Pure hypercholesterolemia   [place]       Future Appointments             In 6 days [place] [place] [facility] [facility]            Passed - Bone Mineral Density or Dexa Scan completed in the last 2 years

## 2021-08-04 ENCOUNTER — Other Ambulatory Visit: Payer: Self-pay

## 2021-08-04 ENCOUNTER — Other Ambulatory Visit: Payer: Medicare HMO

## 2021-08-04 DIAGNOSIS — E7800 Pure hypercholesterolemia, unspecified: Secondary | ICD-10-CM

## 2021-08-04 DIAGNOSIS — E118 Type 2 diabetes mellitus with unspecified complications: Secondary | ICD-10-CM

## 2021-08-04 DIAGNOSIS — E119 Type 2 diabetes mellitus without complications: Secondary | ICD-10-CM

## 2021-08-05 ENCOUNTER — Ambulatory Visit (INDEPENDENT_AMBULATORY_CARE_PROVIDER_SITE_OTHER): Payer: Medicare HMO | Admitting: Family Medicine

## 2021-08-05 ENCOUNTER — Other Ambulatory Visit: Payer: Self-pay | Admitting: Family Medicine

## 2021-08-05 ENCOUNTER — Other Ambulatory Visit: Payer: Self-pay

## 2021-08-05 ENCOUNTER — Telehealth: Payer: Self-pay

## 2021-08-05 ENCOUNTER — Encounter: Payer: Self-pay | Admitting: Family Medicine

## 2021-08-05 VITALS — BP 116/62 | HR 84 | Temp 97.8°F | Wt 154.8 lb

## 2021-08-05 DIAGNOSIS — E781 Pure hyperglyceridemia: Secondary | ICD-10-CM | POA: Diagnosis not present

## 2021-08-05 DIAGNOSIS — E7800 Pure hypercholesterolemia, unspecified: Secondary | ICD-10-CM | POA: Diagnosis not present

## 2021-08-05 DIAGNOSIS — M79671 Pain in right foot: Secondary | ICD-10-CM

## 2021-08-05 DIAGNOSIS — E119 Type 2 diabetes mellitus without complications: Secondary | ICD-10-CM

## 2021-08-05 DIAGNOSIS — J019 Acute sinusitis, unspecified: Secondary | ICD-10-CM

## 2021-08-05 DIAGNOSIS — E118 Type 2 diabetes mellitus with unspecified complications: Secondary | ICD-10-CM

## 2021-08-05 DIAGNOSIS — N3281 Overactive bladder: Secondary | ICD-10-CM

## 2021-08-05 DIAGNOSIS — R1084 Generalized abdominal pain: Secondary | ICD-10-CM | POA: Diagnosis not present

## 2021-08-05 LAB — COMPREHENSIVE METABOLIC PANEL
AG Ratio: 1.4 (calc) (ref 1.0–2.5)
ALT: 16 U/L (ref 6–29)
AST: 22 U/L (ref 10–35)
Albumin: 4.1 g/dL (ref 3.6–5.1)
Alkaline phosphatase (APISO): 61 U/L (ref 37–153)
BUN: 11 mg/dL (ref 7–25)
CO2: 18 mmol/L — ABNORMAL LOW (ref 20–32)
Calcium: 9.4 mg/dL (ref 8.6–10.4)
Chloride: 105 mmol/L (ref 98–110)
Creat: 0.92 mg/dL (ref 0.50–1.05)
Globulin: 2.9 g/dL (calc) (ref 1.9–3.7)
Glucose, Bld: 113 mg/dL — ABNORMAL HIGH (ref 65–99)
Potassium: 4.6 mmol/L (ref 3.5–5.3)
Sodium: 136 mmol/L (ref 135–146)
Total Bilirubin: 0.4 mg/dL (ref 0.2–1.2)
Total Protein: 7 g/dL (ref 6.1–8.1)

## 2021-08-05 LAB — CBC WITH DIFFERENTIAL/PLATELET
Absolute Monocytes: 566 cells/uL (ref 200–950)
Basophils Absolute: 30 cells/uL (ref 0–200)
Basophils Relative: 0.3 %
Eosinophils Absolute: 182 cells/uL (ref 15–500)
Eosinophils Relative: 1.8 %
HCT: 36.5 % (ref 35.0–45.0)
Hemoglobin: 11.8 g/dL (ref 11.7–15.5)
Lymphs Abs: 1889 cells/uL (ref 850–3900)
MCH: 29.1 pg (ref 27.0–33.0)
MCHC: 32.3 g/dL (ref 32.0–36.0)
MCV: 90.1 fL (ref 80.0–100.0)
MPV: 9.4 fL (ref 7.5–12.5)
Monocytes Relative: 5.6 %
Neutro Abs: 7434 cells/uL (ref 1500–7800)
Neutrophils Relative %: 73.6 %
Platelets: 296 10*3/uL (ref 140–400)
RBC: 4.05 10*6/uL (ref 3.80–5.10)
RDW: 14.3 % (ref 11.0–15.0)
Total Lymphocyte: 18.7 %
WBC: 10.1 10*3/uL (ref 3.8–10.8)

## 2021-08-05 LAB — LIPID PANEL
Cholesterol: 107 mg/dL (ref <200)
HDL: 41 mg/dL — ABNORMAL LOW (ref >=50)
LDL Cholesterol (Calc): 41 mg/dL (calc)
Non-HDL Cholesterol (Calc): 66 mg/dL (calc) (ref <130)
Total CHOL/HDL Ratio: 2.6 (calc) (ref <5.0)
Triglycerides: 170 mg/dL — ABNORMAL HIGH (ref <150)

## 2021-08-05 LAB — HEMOGLOBIN A1C
Hgb A1c MFr Bld: 6 % of total Hgb — ABNORMAL HIGH (ref <5.7)
Mean Plasma Glucose: 126 mg/dL
eAG (mmol/L): 7 mmol/L

## 2021-08-05 MED ORDER — LOSARTAN POTASSIUM 25 MG PO TABS: ORAL_TABLET | ORAL | 2 refills | Status: DC

## 2021-08-05 MED ORDER — PANTOPRAZOLE SODIUM 40 MG PO TBEC: 40.0000 mg | DELAYED_RELEASE_TABLET | Freq: Two times a day (BID) | ORAL | 3 refills | Status: DC

## 2021-08-05 MED ORDER — FLUTICASONE PROPIONATE 50 MCG/ACT NA SUSP: 2.0000 | Freq: Every day | NASAL | 1 refills | Status: DC

## 2021-08-05 MED ORDER — PIOGLITAZONE HCL 30 MG PO TABS: 30.0000 mg | ORAL_TABLET | Freq: Every day | ORAL | 3 refills | Status: DC

## 2021-08-05 MED ORDER — TRIAMCINOLONE ACETONIDE 0.1 % EX CREA: TOPICAL_CREAM | Freq: Two times a day (BID) | CUTANEOUS | 3 refills | Status: DC

## 2021-08-05 MED ORDER — OXYBUTYNIN CHLORIDE 5 MG PO TABS: ORAL_TABLET | ORAL | 5 refills | Status: DC

## 2021-08-05 MED ORDER — METFORMIN HCL 1000 MG PO TABS: ORAL_TABLET | ORAL | 3 refills | Status: DC

## 2021-08-05 MED ORDER — HYDROCODONE-ACETAMINOPHEN 5-325 MG PO TABS: 1.0000 | ORAL_TABLET | Freq: Four times a day (QID) | ORAL | 0 refills | Status: DC | PRN

## 2021-08-05 NOTE — Progress Notes (Signed)
Subjective:    Patient ID: Rhonda Fields, female    DOB: March 23, 1950, 71 y.o.   MRN: [id number]  Patient is here today for follow-up.  Her most recent lab work was outstanding.  Her hemoglobin A1c was well controlled.  Her renal function was excellent.  Her LDL cholesterol was well below 100. Orders Only on 08/04/2021  Component Date Value Ref Range Status   Glucose, Bld 08/04/2021 113 (H)  65 - 99 mg/dL Final   Comment: .            Fasting reference interval . For someone without known diabetes, a glucose value between 100 and 125 mg/dL is consistent with prediabetes and should be confirmed with a follow-up test. .    BUN 08/04/2021 11  7 - 25 mg/dL Final   Creat 08/04/2021 0.92  0.50 - 1.05 mg/dL Final   BUN/Creatinine Ratio 17/00/1749 NOT APPLICABLE  6 - 22 (calc) Final   Sodium 08/04/2021 136  135 - 146 mmol/L Final   Potassium 08/04/2021 4.6  3.5 - 5.3 mmol/L Final   Chloride 08/04/2021 105  98 - 110 mmol/L Final   CO2 08/04/2021 18 (L)  20 - 32 mmol/L Final   Calcium 08/04/2021 9.4  8.6 - 10.4 mg/dL Final   Total Protein 08/04/2021 7.0  6.1 - 8.1 g/dL Final   Albumin 08/04/2021 4.1  3.6 - 5.1 g/dL Final   Globulin 08/04/2021 2.9  1.9 - 3.7 g/dL (calc) Final   AG Ratio 08/04/2021 1.4  1.0 - 2.5 (calc) Final   Total Bilirubin 08/04/2021 0.4  0.2 - 1.2 mg/dL Final   Alkaline phosphatase (APISO) 08/04/2021 61  37 - 153 U/L Final   AST 08/04/2021 22  10 - 35 U/L Final   ALT 08/04/2021 16  6 - 29 U/L Final  Lab on 08/04/2021  Component Date Value Ref Range Status   Hgb A1c MFr Bld 08/04/2021 6.0 (H)  <5.7 % of total Hgb Final   Comment: For someone without known diabetes, a hemoglobin  A1c value between 5.7% and 6.4% is consistent with prediabetes and should be confirmed with a  follow-up test. . For someone with known diabetes, a value <7% indicates that their diabetes is well controlled. A1c targets should be individualized based on duration of diabetes, age,  comorbid conditions, and other considerations. . This assay result is consistent with an increased risk of diabetes. . Currently, no consensus exists regarding use of hemoglobin A1c for diagnosis of diabetes for children. .    Mean Plasma Glucose 08/04/2021 126  mg/dL Final   eAG (mmol/L) 08/04/2021 7.0  mmol/L Final   Cholesterol 08/04/2021 107  <200 mg/dL Final   HDL 08/04/2021 41 (L)  > OR = 50 mg/dL Final   Triglycerides 08/04/2021 170 (H)  <150 mg/dL Final   LDL Cholesterol (Calc) 08/04/2021 41  mg/dL (calc) Final   Comment: Reference range: <100 . Desirable range <100 mg/dL for primary prevention;   <70 mg/dL for patients with CHD or diabetic patients  with > or = 2 CHD risk factors. Marland Kitchen LDL-C is now calculated using the Martin-Hopkins  calculation, which is a validated novel method providing  better accuracy than the Friedewald equation in the  estimation of LDL-C.  Cresenciano Genre et al. Annamaria Helling. 4496;759(16): 2061-2068  (http://education.[facility].com/faq/FAQ164)    Total CHOL/HDL Ratio 08/04/2021 2.6  <5.0 (calc) Final   Non-HDL Cholesterol (Calc) 08/04/2021 66  <130 mg/dL (calc) Final   Comment: For patients with diabetes  plus 1 major ASCVD risk  factor, treating to a non-HDL-C goal of <100 mg/dL  (LDL-C of <70 mg/dL) is considered a therapeutic  option.    WBC 08/04/2021 10.1  3.8 - 10.8 Thousand/uL Final   RBC 08/04/2021 4.05  3.80 - 5.10 Million/uL Final   Hemoglobin 08/04/2021 11.8  11.7 - 15.5 g/dL Final   HCT 08/04/2021 36.5  35.0 - 45.0 % Final   MCV 08/04/2021 90.1  80.0 - 100.0 fL Final   MCH 08/04/2021 29.1  27.0 - 33.0 pg Final   MCHC 08/04/2021 32.3  32.0 - 36.0 g/dL Final   RDW 08/04/2021 14.3  11.0 - 15.0 % Final   Platelets 08/04/2021 296  140 - 400 Thousand/uL Final   MPV 08/04/2021 9.4  7.5 - 12.5 fL Final   Neutro Abs 08/04/2021 7,434  1,500 - 7,800 cells/uL Final   Lymphs Abs 08/04/2021 1,889  850 - 3,900 cells/uL Final   Absolute Monocytes  08/04/2021 566  200 - 950 cells/uL Final   Eosinophils Absolute 08/04/2021 182  15 - 500 cells/uL Final   Basophils Absolute 08/04/2021 30  0 - 200 cells/uL Final   Neutrophils Relative % 08/04/2021 73.6  % Final   Total Lymphocyte 08/04/2021 18.7  % Final   Monocytes Relative 08/04/2021 5.6  % Final   Eosinophils Relative 08/04/2021 1.8  % Final   Basophils Relative 08/04/2021 0.3  % Final   Her blood pressure today is excellent at 116/62.  However she has 2 concerns.  First she has pain in her right foot.  She jumps whenever I palpate the proximal end of the fifth metatarsal.  She injured this in a fall about 1 month ago.  She reports stabbing pain in that side of her foot occasionally.  There is no visible deformity or bruising or erythema.  Second she has a ventral abdominal hernia.  This is been present ever since she had her gallbladder removed in her 55s.  There is no evidence of strangulation. Past Medical History:  Diagnosis Date   Anxiety    Arrhythmia    Chronic headaches    Depression    Diabetes mellitus without complication (HCC)    GERD (gastroesophageal reflux disease)    Hard of hearing    IBS (irritable bowel syndrome) 1986   Past Surgical History:  Procedure Laterality Date   ABDOMINAL HYSTERECTOMY     CHOLECYSTECTOMY     ERCP  04/20/2011   Procedure: ENDOSCOPIC RETROGRADE CHOLANGIOPANCREATOGRAPHY (ERCP);  Surgeon: Inda Castle, MD;  Location: [facility];  Service: Endoscopy;  Laterality: N/A;  case is at 1430 in or   ERCP  04/20/2011   Procedure: ENDOSCOPIC RETROGRADE CHOLANGIOPANCREATOGRAPHY (ERCP);  Surgeon: Inda Castle, MD;  Location: [facility];  Service: Gastroenterology;  Laterality: N/A;   Current Outpatient Medications on File Prior to Visit  Medication Sig Dispense Refill   Alcohol Swabs (DROPSAFE ALCOHOL PREP) 70 % PADS USE AS DIRECTED TO MONITOR BLOOD GLUCOSE UP TO THREE TIMES DAILY 300 each 3   alendronate (FOSAMAX) 70 MG tablet Take 1 tablet (70 mg  total) by mouth every 7 (seven) days. Take with a full glass of water on an empty stomach. 12 tablet 3   Ascorbic Acid (VITAMIN C) 1000 MG tablet Take 1,000 mg by mouth daily.     aspirin 81 MG chewable tablet Chew by mouth daily.     atorvastatin (LIPITOR) 40 MG tablet TAKE 1 TABLET EVERY DAY 90 tablet 3  BIOTIN 5000 PO Take by mouth.     Blood Glucose Calibration (TRUE METRIX LEVEL 1) Low SOLN Use as directed to monitor FSBS 1x daily. Dx: E11.9 100 each 3   Blood Glucose Monitoring Suppl (TRUE METRIX METER) w/Device KIT USE AS DIRECTED 1 kit 3   Calcium Carbonate-Vit D-Min (CALCIUM 1200) 1200-1000 MG-UNIT CHEW Chew by mouth.     Cinnamon 500 MG TABS Take by mouth.     fluticasone (FLONASE) 50 MCG/ACT nasal spray USE 2 SPRAYS IN EACH NOSTRIL EVERY DAY 48 g 1   glucose blood test strip Use as instructed 100 each 3   HYDROcodone-acetaminophen (NORCO) 5-325 MG tablet Take 1 tablet by mouth every 6 (six) hours as needed for moderate pain. 30 tablet 0   losartan (COZAAR) 25 MG tablet TAKE 1 TABLET EVERY DAY (DISCONTINUE LOSARTAN 50 MG) 90 tablet 2   metFORMIN (GLUCOPHAGE) 1000 MG tablet TAKE 1 TABLET TWICE DAILY WITH A MEAL 180 tablet 3   Multiple Vitamin (MULTIVITAMIN WITH MINERALS) TABS tablet Take 1 tablet by mouth daily.     Omega-3 Fatty Acids (FISH OIL) 1200 MG CPDR Take by mouth 2 (two) times daily.     oxybutynin (DITROPAN) 5 MG tablet TAKE 1 TABLET FOUR TIMES DAILY 120 tablet 5   pantoprazole (PROTONIX) 40 MG tablet TAKE 1 TABLET TWICE DAILY 180 tablet 3   pioglitazone (ACTOS) 30 MG tablet TAKE 1 TABLET EVERY DAY 90 tablet 3   triamcinolone cream (KENALOG) 0.1 % APPLY TOPICALLY TWICE DAILY 30 g 3   TRUEplus Lancets 33G MISC Use as directed to monitor FSBS 1x daily. Dx: E11.9 100 each 3   zinc gluconate 50 MG tablet Take 50 mg by mouth daily.     No current facility-administered medications on file prior to visit.   Allergies  Allergen Reactions   Contrast Media [Iodinated Contrast  Media] Nausea And Vomiting   Penicillins Hives and Itching   Social History   Socioeconomic History   Marital status: Widowed    Spouse name: Not on file   Number of children: 4   Years of education: Not on file   Highest education level: Not on file  Occupational History   Occupation: Unemployed  Tobacco Use   Smoking status: Every Day    Packs/day: 1.00    Years: 40.00    Total pack years: 40.00    Types: Cigarettes   Smokeless tobacco: Never  Substance and Sexual Activity   Alcohol use: No   Drug use: No   Sexual activity: Not on file  Other Topics Concern   Not on file  Social History Narrative   Daughter lives next door.   Widowed. Was married x 37 years.   Social Determinants of Health   Financial Resource Strain: Low Risk  (02/28/2021)   Overall Financial Resource Strain (CARDIA)    Difficulty of Paying Living Expenses: Not very hard  Food Insecurity: No Food Insecurity (02/28/2021)   Hunger Vital Sign    Worried About Running Out of Food in the Last Year: Never true    Ran Out of Food in the Last Year: Never true  Transportation Needs: No Transportation Needs (02/28/2021)   PRAPARE - Hydrologist (Medical): No    Lack of Transportation (Non-Medical): No  Physical Activity: Insufficiently Active (02/28/2021)   Exercise Vital Sign    Days of Exercise per Week: 2 days    Minutes of Exercise per Session: 10 min  Stress:  No Stress Concern Present (02/28/2021)   [place]    Feeling of Stress : Only a little  Social Connections: Socially Isolated (02/28/2021)   Social Connection and Isolation Panel [NHANES]    Frequency of Communication with Friends and Family: More than three times a week    Frequency of Social Gatherings with Friends and Family: More than three times a week    Attends Religious Services: Never    Marine scientist or Organizations: No    Attends  Archivist Meetings: Never    Marital Status: Divorced  Human resources officer Violence: Not At Risk (02/28/2021)   Humiliation, Afraid, Rape, and Kick questionnaire    Fear of Current or Ex-Partner: No    Emotionally Abused: No    Physically Abused: No    Sexually Abused: No     Review of Systems  All other systems reviewed and are negative.      Objective:   Physical Exam Vitals reviewed.  Constitutional:      Appearance: Normal appearance. She is normal weight.  Cardiovascular:     Rate and Rhythm: Normal rate and regular rhythm.     Heart sounds: Normal heart sounds.  Pulmonary:     Effort: Pulmonary effort is normal. No respiratory distress.     Breath sounds: Normal breath sounds. No wheezing or rales.  Chest:     Chest wall: No tenderness.  Abdominal:     General: Bowel sounds are normal. There is no distension.     Palpations: Abdomen is soft.     Tenderness: There is no abdominal tenderness. There is no guarding or rebound.     Hernia: A hernia is present. Hernia is present in the ventral area.  Neurological:     Mental Status: She is alert.           Assessment & Plan:   Diabetes mellitus without complication ([place]) - Plan: CANCELED: CBC with Differential/Platelet, CANCELED: Lipid panel, CANCELED: Microalbumin, urine, CANCELED: COMPLETE METABOLIC PANEL WITH GFR, CANCELED: Hemoglobin A1c, CANCELED: TSH  Pure hypercholesterolemia - Plan: CANCELED: CBC with Differential/Platelet, CANCELED: Lipid panel, CANCELED: Microalbumin, urine, CANCELED: COMPLETE METABOLIC PANEL WITH GFR, CANCELED: Hemoglobin A1c, CANCELED: TSH  Generalized abdominal pain  Hypertriglyceridemia - Plan: CANCELED: CBC with Differential/Platelet, CANCELED: Lipid panel, CANCELED: Microalbumin, urine, CANCELED: COMPLETE METABOLIC PANEL WITH GFR, CANCELED: Hemoglobin A1c, CANCELED: TSH  Pain in right foot - Plan: DG Foot Complete Right I explained to the patient that the mass in the center  of her belly is a ventral abdominal hernia.  I offered the patient a referral to general surgery to correct however she declines at the present time as it is causing her no pain or discomfort.  Her hemoglobin A1c, her cholesterol, her renal function are all outstanding.  Her blood pressure is excellent.  However I am concerned by the pain over the lateral aspect of her right foot.  Given the pain over the fifth metatarsal I will check a x-ray of the right foot to rule out a Jones fracture.

## 2021-08-05 NOTE — Telephone Encounter (Signed)
Pt just wanted nurse to know that her prescriptions need to go to [facility] on [address].

## 2021-08-05 NOTE — Telephone Encounter (Signed)
That is where it was sent to,pt aware

## 2021-08-27 ENCOUNTER — Other Ambulatory Visit: Payer: Self-pay | Admitting: Family Medicine

## 2021-08-27 DIAGNOSIS — Z0100 Encounter for examination of eyes and vision without abnormal findings: Secondary | ICD-10-CM | POA: Diagnosis not present

## 2021-08-27 DIAGNOSIS — H40013 Open angle with borderline findings, low risk, bilateral: Secondary | ICD-10-CM | POA: Diagnosis not present

## 2021-08-27 LAB — HM DIABETES EYE EXAM

## 2021-08-27 NOTE — Telephone Encounter (Signed)
Refilled 08/05/2021 #90 2 refills, confirmed by same pharmacy. Requested Prescriptions  Pending Prescriptions Disp Refills  . losartan (COZAAR) 25 MG tablet [Pharmacy Med Name: [place] 25 MG Tablet] 90 tablet 2    Sig: TAKE 1 TABLET EVERY DAY (DISCONTINUE LOSARTAN 50 MG)     Cardiovascular:  Angiotensin Receptor Blockers Failed - 08/27/2021  3:59 AM      Failed - Valid encounter within last 6 months    Recent Outpatient Visits          10 months ago OAB (overactive bladder)   [place] Susy Frizzle, MD   1 year ago Diabetes mellitus without complication ([place])   [place] Pickard, Cammie Mcgee, MD   1 year ago Foot pain, right   [place] Pickard, Cammie Mcgee, MD   1 year ago Diabetes mellitus without complication ([place])   [place] Susy Frizzle, MD   1 year ago Pure hypercholesterolemia   [place] Pickard, Cammie Mcgee, MD             Passed - Cr in normal range and within 180 days    Creat  Date Value Ref Range Status  08/04/2021 0.92 0.50 - 1.05 mg/dL Final   Creatinine, Urine  Date Value Ref Range Status  05/13/2020 33 20 - 275 mg/dL Final         Passed - K in normal range and within 180 days    Potassium  Date Value Ref Range Status  08/04/2021 4.6 3.5 - 5.3 mmol/L Final         Passed - Patient is not pregnant      Passed - Last BP in normal range    BP Readings from Last 1 Encounters:  08/05/21 116/62

## 2021-09-09 ENCOUNTER — Encounter: Payer: Self-pay | Admitting: Family Medicine

## 2021-09-29 ENCOUNTER — Ambulatory Visit (INDEPENDENT_AMBULATORY_CARE_PROVIDER_SITE_OTHER): Payer: Medicare HMO | Admitting: Family Medicine

## 2021-09-29 VITALS — BP 120/62 | HR 82 | Temp 98.7°F | Ht 63.0 in | Wt 151.6 lb

## 2021-09-29 DIAGNOSIS — J019 Acute sinusitis, unspecified: Secondary | ICD-10-CM | POA: Diagnosis not present

## 2021-09-29 MED ORDER — AZITHROMYCIN 250 MG PO TABS: ORAL_TABLET | ORAL | 0 refills | Status: DC

## 2021-09-29 NOTE — Progress Notes (Signed)
Subjective:    Patient ID: Rhonda Fields, female    DOB: November 14, 1950, 71 y.o.   MRN: [id number]  Patient presents today with a 5-day history of pain and pressure in her frontal sinuses.  She reports severe headache.  She reports rhinorrhea and head congestion.  She is tender to percussion over the frontal and maxillary sinuses.  She reports postnasal drip causing cough.  She denies any chest pain or shortness of breath or pleurisy Past Medical History:  Diagnosis Date   Anxiety    Arrhythmia    Chronic headaches    Depression    Diabetes mellitus without complication (HCC)    GERD (gastroesophageal reflux disease)    Hard of hearing    IBS (irritable bowel syndrome) 1986   Past Surgical History:  Procedure Laterality Date   ABDOMINAL HYSTERECTOMY     CHOLECYSTECTOMY     ERCP  04/20/2011   Procedure: ENDOSCOPIC RETROGRADE CHOLANGIOPANCREATOGRAPHY (ERCP);  Surgeon: Inda Castle, MD;  Location: [facility];  Service: Endoscopy;  Laterality: N/A;  case is at 1430 in or   ERCP  04/20/2011   Procedure: ENDOSCOPIC RETROGRADE CHOLANGIOPANCREATOGRAPHY (ERCP);  Surgeon: Inda Castle, MD;  Location: [facility];  Service: Gastroenterology;  Laterality: N/A;   Current Outpatient Medications on File Prior to Visit  Medication Sig Dispense Refill   Alcohol Swabs (DROPSAFE ALCOHOL PREP) 70 % PADS USE AS DIRECTED TO MONITOR BLOOD GLUCOSE UP TO THREE TIMES DAILY 300 each 3   alendronate (FOSAMAX) 70 MG tablet TAKE 1 TABLET EVERY 7 DAYS WITH A FULL GLASS OF WATER ON AN EMPTY STOMACH 12 tablet 3   Ascorbic Acid (VITAMIN C) 1000 MG tablet Take 1,000 mg by mouth daily.     aspirin 81 MG chewable tablet Chew by mouth daily.     atorvastatin (LIPITOR) 40 MG tablet TAKE 1 TABLET EVERY DAY 90 tablet 3   BIOTIN 5000 PO Take by mouth.     Blood Glucose Calibration (TRUE METRIX LEVEL 1) Low SOLN Use as directed to monitor FSBS 1x daily. Dx: E11.9 100 each 3   Blood Glucose Monitoring Suppl (TRUE  METRIX METER) w/Device KIT USE AS DIRECTED 1 kit 3   Calcium Carbonate-Vit D-Min (CALCIUM 1200) 1200-1000 MG-UNIT CHEW Chew by mouth.     Cinnamon 500 MG TABS Take by mouth.     fluticasone (FLONASE) 50 MCG/ACT nasal spray Place 2 sprays into both nostrils daily. 48 g 1   glucose blood test strip Use as instructed 100 each 3   HYDROcodone-acetaminophen (NORCO) 5-325 MG tablet Take 1 tablet by mouth every 6 (six) hours as needed for moderate pain. 30 tablet 0   losartan (COZAAR) 25 MG tablet TAKE 1 TABLET EVERY DAY 90 tablet 2   metFORMIN (GLUCOPHAGE) 1000 MG tablet TAKE 1 TABLET TWICE DAILY WITH A MEAL 180 tablet 3   Multiple Vitamin (MULTIVITAMIN WITH MINERALS) TABS tablet Take 1 tablet by mouth daily.     Omega-3 Fatty Acids (FISH OIL) 1200 MG CPDR Take by mouth 2 (two) times daily.     oxybutynin (DITROPAN) 5 MG tablet TAKE 1 TABLET FOUR TIMES DAILY 120 tablet 5   pantoprazole (PROTONIX) 40 MG tablet Take 1 tablet (40 mg total) by mouth 2 (two) times daily. 180 tablet 3   pioglitazone (ACTOS) 30 MG tablet Take 1 tablet (30 mg total) by mouth daily. 90 tablet 3   triamcinolone cream (KENALOG) 0.1 % Apply topically 2 (two) times daily. 30 g 3  TRUEplus Lancets 33G MISC Use as directed to monitor FSBS 1x daily. Dx: E11.9 100 each 3   zinc gluconate 50 MG tablet Take 50 mg by mouth daily.     No current facility-administered medications on file prior to visit.   Allergies  Allergen Reactions   Contrast Media [Iodinated Contrast Media] Nausea And Vomiting   Penicillins Hives and Itching   Social History   Socioeconomic History   Marital status: Widowed    Spouse name: Not on file   Number of children: 4   Years of education: Not on file   Highest education level: Not on file  Occupational History   Occupation: Unemployed  Tobacco Use   Smoking status: Every Day    Packs/day: 1.00    Years: 40.00    Total pack years: 40.00    Types: Cigarettes   Smokeless tobacco: Never   Substance and Sexual Activity   Alcohol use: No   Drug use: No   Sexual activity: Not on file  Other Topics Concern   Not on file  Social History Narrative   Daughter lives next door.   Widowed. Was married x 37 years.   Social Determinants of Health   Financial Resource Strain: Low Risk  (02/28/2021)   Overall Financial Resource Strain (CARDIA)    Difficulty of Paying Living Expenses: Not very hard  Food Insecurity: No Food Insecurity (02/28/2021)   Hunger Vital Sign    Worried About Running Out of Food in the Last Year: Never true    Ran Out of Food in the Last Year: Never true  Transportation Needs: No Transportation Needs (02/28/2021)   PRAPARE - Hydrologist (Medical): No    Lack of Transportation (Non-Medical): No  Physical Activity: Insufficiently Active (02/28/2021)   Exercise Vital Sign    Days of Exercise per Week: 2 days    Minutes of Exercise per Session: 10 min  Stress: No Stress Concern Present (02/28/2021)   [place]    Feeling of Stress : Only a little  Social Connections: Socially Isolated (02/28/2021)   Social Connection and Isolation Panel [NHANES]    Frequency of Communication with Friends and Family: More than three times a week    Frequency of Social Gatherings with Friends and Family: More than three times a week    Attends Religious Services: Never    Marine scientist or Organizations: No    Attends Archivist Meetings: Never    Marital Status: Divorced  Human resources officer Violence: Not At Risk (02/28/2021)   Humiliation, Afraid, Rape, and Kick questionnaire    Fear of Current or Ex-Partner: No    Emotionally Abused: No    Physically Abused: No    Sexually Abused: No     Review of Systems  All other systems reviewed and are negative.      Objective:   Physical Exam Vitals reviewed.  Constitutional:      Appearance: Normal appearance.  She is normal weight.  HENT:     Right Ear: Tympanic membrane is erythematous and bulging.     Left Ear: Tympanic membrane is not erythematous, retracted or bulging.     Nose:     Right Sinus: Frontal sinus tenderness present.     Left Sinus: Frontal sinus tenderness present.  Cardiovascular:     Rate and Rhythm: Normal rate and regular rhythm.     Heart sounds: Normal heart  sounds.  Pulmonary:     Effort: Pulmonary effort is normal. No respiratory distress.     Breath sounds: Normal breath sounds. No wheezing or rales.  Chest:     Chest wall: No tenderness.  Abdominal:     General: Bowel sounds are normal. There is no distension.     Palpations: Abdomen is soft.     Tenderness: There is no abdominal tenderness. There is no guarding or rebound.     Hernia: A hernia is present. Hernia is present in the ventral area.  Neurological:     Mental Status: She is alert.           Assessment & Plan:   Acute rhinosinusitis I believe the patient has a sinus infection.  Recommended a Z-Pak.  Use Mucinex daily for head congestion.  Recheck in 1 week if no better or sooner if worse

## 2021-10-01 DIAGNOSIS — H40013 Open angle with borderline findings, low risk, bilateral: Secondary | ICD-10-CM | POA: Diagnosis not present

## 2021-11-03 ENCOUNTER — Other Ambulatory Visit: Payer: Self-pay | Admitting: Family Medicine

## 2021-11-24 ENCOUNTER — Other Ambulatory Visit: Payer: Self-pay | Admitting: Family Medicine

## 2021-11-24 NOTE — Telephone Encounter (Signed)
Requested medication (s) are due for refill today - provider review   Requested medication (s) are on the active medication list -yes  Future visit scheduled -no  Last refill: 08/05/21 30g 3RF  Notes to clinic: non delegated Rx  Requested Prescriptions  Pending Prescriptions Disp Refills   triamcinolone cream (KENALOG) 0.1 % [Pharmacy Med Name: TRIAMCINOLONE ACETONIDE 0.1 % Cream] 30 g 10    Sig: Apply topically 2 (two) times daily.     Not Delegated - Dermatology:  Corticosteroids Failed - 11/24/2021 11:05 AM      Failed - This refill cannot be delegated      Failed - Valid encounter within last 12 months    Recent Outpatient Visits           1 year ago OAB (overactive bladder)   [place] Susy Frizzle, MD   1 year ago Diabetes mellitus without complication [facility])   [place] Pickard, Cammie Mcgee, MD   1 year ago Foot pain, right   [place] Pickard, Cammie Mcgee, MD   1 year ago Diabetes mellitus without complication [facility])   [facility] Pickard, Cammie Mcgee, MD   2 years ago Pure hypercholesterolemia   [place] Pickard, Cammie Mcgee, MD                 Requested Prescriptions  Pending Prescriptions Disp Refills   triamcinolone cream (KENALOG) 0.1 % [Pharmacy Med Name: TRIAMCINOLONE ACETONIDE 0.1 % Cream] 30 g 10    Sig: Apply topically 2 (two) times daily.     Not Delegated - Dermatology:  Corticosteroids Failed - 11/24/2021 11:05 AM      Failed - This refill cannot be delegated      Failed - Valid encounter within last 12 months    Recent Outpatient Visits           1 year ago OAB (overactive bladder)   [place] Susy Frizzle, MD   1 year ago Diabetes mellitus without complication [facility])   [place] Pickard, Cammie Mcgee, MD   1 year ago Foot pain, right   [place] Pickard, Cammie Mcgee, MD   1 year ago Diabetes  mellitus without complication [facility])   [facility] Pickard, Cammie Mcgee, MD   2 years ago Pure hypercholesterolemia   [place] Pickard, Cammie Mcgee, MD

## 2021-11-28 ENCOUNTER — Other Ambulatory Visit: Payer: Self-pay | Admitting: Family Medicine

## 2021-11-28 NOTE — Telephone Encounter (Signed)
Requested medication (s) are due for refill today - provider review- refused 3 days ago  Requested medication (s) are on the active medication list -yes  Future visit scheduled -no  Last refill: 08/05/21 30g 3RF  Notes to clinic: non delegated Rx  Requested Prescriptions  Pending Prescriptions Disp Refills   triamcinolone cream (KENALOG) 0.1 % [Pharmacy Med Name: TRIAMCINOLONE ACETONIDE 0.1 % Cream] 30 g 10    Sig: Apply topically 2 (two) times daily.     Not Delegated - Dermatology:  Corticosteroids Failed - 11/28/2021 12:22 PM      Failed - This refill cannot be delegated      Failed - Valid encounter within last 12 months    Recent Outpatient Visits           1 year ago OAB (overactive bladder)   [place] Susy Frizzle, MD   1 year ago Diabetes mellitus without complication [facility])   [place] Pickard, Cammie Mcgee, MD   1 year ago Foot pain, right   [place] Pickard, Cammie Mcgee, MD   1 year ago Diabetes mellitus without complication [facility])   [facility] Pickard, Cammie Mcgee, MD   2 years ago Pure hypercholesterolemia   [place] Pickard, Cammie Mcgee, MD                 Requested Prescriptions  Pending Prescriptions Disp Refills   triamcinolone cream (KENALOG) 0.1 % [Pharmacy Med Name: TRIAMCINOLONE ACETONIDE 0.1 % Cream] 30 g 10    Sig: Apply topically 2 (two) times daily.     Not Delegated - Dermatology:  Corticosteroids Failed - 11/28/2021 12:22 PM      Failed - This refill cannot be delegated      Failed - Valid encounter within last 12 months    Recent Outpatient Visits           1 year ago OAB (overactive bladder)   [place] Susy Frizzle, MD   1 year ago Diabetes mellitus without complication [facility])   [place] Pickard, Cammie Mcgee, MD   1 year ago Foot pain, right   [place] Pickard, Cammie Mcgee, MD   1 year  ago Diabetes mellitus without complication [facility])   [facility] Pickard, Cammie Mcgee, MD   2 years ago Pure hypercholesterolemia   [place] Pickard, Cammie Mcgee, MD

## 2022-01-01 ENCOUNTER — Other Ambulatory Visit: Payer: Self-pay

## 2022-01-01 ENCOUNTER — Encounter (HOSPITAL_BASED_OUTPATIENT_CLINIC_OR_DEPARTMENT_OTHER): Payer: Self-pay

## 2022-01-01 ENCOUNTER — Emergency Department (HOSPITAL_BASED_OUTPATIENT_CLINIC_OR_DEPARTMENT_OTHER)
Admission: EM | Admit: 2022-01-01 | Discharge: 2022-01-01 | Disposition: A | Discharge status: Home / Self Care | Payer: Medicare HMO | Attending: Emergency Medicine | Admitting: Emergency Medicine

## 2022-01-01 ENCOUNTER — Emergency Department (HOSPITAL_BASED_OUTPATIENT_CLINIC_OR_DEPARTMENT_OTHER): Payer: Medicare HMO

## 2022-01-01 ENCOUNTER — Encounter: Payer: Self-pay | Admitting: Family Medicine

## 2022-01-01 ENCOUNTER — Ambulatory Visit (INDEPENDENT_AMBULATORY_CARE_PROVIDER_SITE_OTHER): Payer: Medicare HMO | Admitting: Family Medicine

## 2022-01-01 VITALS — BP 115/70 | HR 104 | Temp 97.6°F | Ht 63.0 in | Wt 146.0 lb

## 2022-01-01 DIAGNOSIS — N2889 Other specified disorders of kidney and ureter: Secondary | ICD-10-CM | POA: Diagnosis not present

## 2022-01-01 DIAGNOSIS — N281 Cyst of kidney, acquired: Secondary | ICD-10-CM | POA: Diagnosis not present

## 2022-01-01 DIAGNOSIS — R1084 Generalized abdominal pain: Secondary | ICD-10-CM

## 2022-01-01 DIAGNOSIS — R109 Unspecified abdominal pain: Secondary | ICD-10-CM | POA: Insufficient documentation

## 2022-01-01 DIAGNOSIS — K439 Ventral hernia without obstruction or gangrene: Secondary | ICD-10-CM | POA: Diagnosis not present

## 2022-01-01 DIAGNOSIS — Z79899 Other long term (current) drug therapy: Secondary | ICD-10-CM | POA: Diagnosis not present

## 2022-01-01 DIAGNOSIS — Z7982 Long term (current) use of aspirin: Secondary | ICD-10-CM | POA: Insufficient documentation

## 2022-01-01 LAB — CBC
HCT: 37 % (ref 36.0–46.0)
Hemoglobin: 12.4 g/dL (ref 12.0–15.0)
MCH: 29.2 pg (ref 26.0–34.0)
MCHC: 33.5 g/dL (ref 30.0–36.0)
MCV: 87.3 fL (ref 80.0–100.0)
Platelets: 276 10*3/uL (ref 150–400)
RBC: 4.24 MIL/uL (ref 3.87–5.11)
RDW: 14.9 % (ref 11.5–15.5)
WBC: 11.5 10*3/uL — ABNORMAL HIGH (ref 4.0–10.5)
nRBC: 0 % (ref 0.0–0.2)

## 2022-01-01 LAB — COMPREHENSIVE METABOLIC PANEL
ALT: 14 U/L (ref 0–44)
AST: 21 U/L (ref 15–41)
Albumin: 4.3 g/dL (ref 3.5–5.0)
Alkaline Phosphatase: 58 U/L (ref 38–126)
Anion gap: 15 (ref 5–15)
BUN: 13 mg/dL (ref 8–23)
CO2: 22 mmol/L (ref 22–32)
Calcium: 9.4 mg/dL (ref 8.9–10.3)
Chloride: 100 mmol/L (ref 98–111)
Creatinine, Ser: 0.9 mg/dL (ref 0.44–1.00)
GFR, Estimated: >60 mL/min (ref >60)
Glucose, Bld: 124 mg/dL — ABNORMAL HIGH (ref 70–99)
Potassium: 4.1 mmol/L (ref 3.5–5.1)
Sodium: 137 mmol/L (ref 135–145)
Total Bilirubin: 0.6 mg/dL (ref 0.3–1.2)
Total Protein: 7.5 g/dL (ref 6.5–8.1)

## 2022-01-01 LAB — LIPASE, BLOOD: Lipase: 23 U/L (ref 11–51)

## 2022-01-01 MED ORDER — ACETAMINOPHEN 500 MG PO TABS
1000.0000 mg | ORAL_TABLET | Freq: Once | ORAL | Status: DC
  Filled 2022-01-01: qty 2

## 2022-01-01 NOTE — Discharge Instructions (Addendum)
It was our pleasure to provide your ER care today - we hope that you feel better.  Drink plenty of fluids/stay well hydrated. Take acetaminophen as need.   Your ct scan was read as showing: 1. Small to moderate volume umbilical hernia containing a short loop  of mid transverse colon and omentum. Associated abdominal defect of  3.4 x 2 cm. No definite findings of associated bowel obstruction. No  pneumatosis with limited evaluation for ischemia of the contained  bowel on this noncontrast study. 2. Colonic diverticulosis with no acute diverticulitis. 3. Tiny hiatal hernia. 4. Question nodular hepatic contour with no other signs of a cirrhotic morphology. Consider follow-up with outpatient ultrasound.   As relates hernia, follow up with general surgeon in the next couple weeks - call office this afternoon or tomorrow AM to arrange appointment.    For other findings on scan, discuss them and follow up plan with your primary care doctor.   Return to ER if worse, new symptoms, fevers, persistent vomiting, new or worsening or severe abdominal pain, if hernia becomes increasingly painful and/or stuck out, or other concern.

## 2022-01-01 NOTE — Progress Notes (Signed)
Acute Office Visit  Subjective:     Patient ID: Rhonda Fields, female    DOB: [birth year], 71 y.o.   MRN: [id number]  Chief Complaint  Patient presents with   Follow-up    having hurting abdomen cramps/R-side is hurting been for 2 wks/having trouble walking/sleeping prob    HPI Patient is in today for 3 weeks of worsening mid to right sided abdominal pain, worse with sitting or walking, 2 days of loose BM that is foul smelling, nausea, anorexia. Her hernias are also "hard" where they are usually compressible, she also reports feeling cold. Denies fall or heavy lifting, vomiting, fevers.  Review of Systems  All other systems reviewed and are negative.   Past Medical History:  Diagnosis Date   Anxiety    Arrhythmia    Chronic headaches    Depression    Diabetes mellitus without complication (HCC)    GERD (gastroesophageal reflux disease)    Hard of hearing    IBS (irritable bowel syndrome) 1986   Past Surgical History:  Procedure Laterality Date   ABDOMINAL HYSTERECTOMY     CHOLECYSTECTOMY     ERCP  04/20/2011   Procedure: ENDOSCOPIC RETROGRADE CHOLANGIOPANCREATOGRAPHY (ERCP);  Surgeon: Inda Castle, MD;  Location: [facility];  Service: Endoscopy;  Laterality: N/A;  case is at 1430 in or   ERCP  04/20/2011   Procedure: ENDOSCOPIC RETROGRADE CHOLANGIOPANCREATOGRAPHY (ERCP);  Surgeon: Inda Castle, MD;  Location: [facility];  Service: Gastroenterology;  Laterality: N/A;   Current Outpatient Medications on File Prior to Visit  Medication Sig Dispense Refill   Alcohol Swabs (DROPSAFE ALCOHOL PREP) 70 % PADS USE AS DIRECTED TO MONITOR BLOOD GLUCOSE UP TO THREE TIMES DAILY 300 each 3   alendronate (FOSAMAX) 70 MG tablet TAKE 1 TABLET EVERY 7 DAYS WITH A FULL GLASS OF WATER ON AN EMPTY STOMACH 12 tablet 3   Ascorbic Acid (VITAMIN C) 1000 MG tablet Take 1,000 mg by mouth daily.     aspirin 81 MG chewable tablet Chew by mouth daily.     atorvastatin (LIPITOR) 40 MG  tablet TAKE 1 TABLET EVERY DAY 90 tablet 10   azithromycin (ZITHROMAX) 250 MG tablet 2 tabs poqday1, 1 tab poqday 2-5 6 tablet 0   BIOTIN 5000 PO Take by mouth.     Blood Glucose Calibration (TRUE METRIX LEVEL 1) Low SOLN Use as directed to monitor FSBS 1x daily. Dx: E11.9 100 each 3   Blood Glucose Monitoring Suppl (TRUE METRIX METER) w/Device KIT USE AS DIRECTED 1 kit 3   Calcium Carbonate-Vit D-Min (CALCIUM 1200) 1200-1000 MG-UNIT CHEW Chew by mouth.     Cinnamon 500 MG TABS Take by mouth.     fluticasone (FLONASE) 50 MCG/ACT nasal spray Place 2 sprays into both nostrils daily. 48 g 1   glucose blood test strip Use as instructed 100 each 3   HYDROcodone-acetaminophen (NORCO) 5-325 MG tablet Take 1 tablet by mouth every 6 (six) hours as needed for moderate pain. 30 tablet 0   losartan (COZAAR) 25 MG tablet TAKE 1 TABLET EVERY DAY 90 tablet 2   metFORMIN (GLUCOPHAGE) 1000 MG tablet TAKE 1 TABLET TWICE DAILY WITH A MEAL 180 tablet 3   Multiple Vitamin (MULTIVITAMIN WITH MINERALS) TABS tablet Take 1 tablet by mouth daily.     Omega-3 Fatty Acids (FISH OIL) 1200 MG CPDR Take by mouth 2 (two) times daily.     oxybutynin (DITROPAN) 5 MG tablet TAKE 1 TABLET FOUR TIMES DAILY  120 tablet 5   pantoprazole (PROTONIX) 40 MG tablet Take 1 tablet (40 mg total) by mouth 2 (two) times daily. 180 tablet 3   pioglitazone (ACTOS) 30 MG tablet Take 1 tablet (30 mg total) by mouth daily. 90 tablet 3   triamcinolone cream (KENALOG) 0.1 % Apply topically 2 (two) times daily. 30 g 3   TRUEplus Lancets 33G MISC Use as directed to monitor FSBS 1x daily. Dx: E11.9 100 each 3   zinc gluconate 50 MG tablet Take 50 mg by mouth daily.     No current facility-administered medications on file prior to visit.   Allergies  Allergen Reactions   Contrast Media [Iodinated Contrast Media] Nausea And Vomiting   Penicillins Hives and Itching       Objective:    BP 115/70   Pulse (!) 104   Temp 97.6 F (36.4 C) (Oral)    Ht _0  (1.6 m)   Wt 146 lb (66.2 kg)   SpO2 98%   BMI 25.86 kg/m  BP Readings from Last 3 Encounters:  01/01/22 115/70  09/29/21 120/62  08/05/21 116/62      Physical Exam Vitals and nursing note reviewed.  Constitutional:      General: She is not in acute distress.    Appearance: Normal appearance. She is not toxic-appearing or diaphoretic.  HENT:     Head: Normocephalic and atraumatic.  Cardiovascular:     Rate and Rhythm: Normal rate and regular rhythm.     Pulses: Normal pulses.  Abdominal:     General: Bowel sounds are normal.     Palpations: Abdomen is soft.     Tenderness: There is generalized abdominal tenderness and tenderness in the right upper quadrant, right lower quadrant, epigastric area and periumbilical area.     Hernia: A hernia is present. Hernia is present in the umbilical area.  Skin:    General: Skin is warm and dry.  Neurological:     General: No focal deficit present.     Mental Status: She is alert and oriented to person, place, and time. Mental status is at baseline.  Psychiatric:        Mood and Affect: Mood normal.        Behavior: Behavior normal.        Thought Content: Thought content normal.        Judgment: Judgment normal.     No results found for any visits on 01/01/22.      Assessment & Plan:   Problem List Items Addressed This Visit       Other   Abdominal pain, right lateral - Primary    Patients presentation today is concerning for acute abdominal process, referred to ED for imaging to evaluate for possible appendicitis. On exam her abdomen was tender to soft palpation diffusely but worse on the right side. Her hernia is compressible and bowel sounds are present. Vital signs are stable and she is not ill-appearing. CBC and CMP were drawn in office.       Relevant Orders   CBC with Differential/Platelet   COMPLETE METABOLIC PANEL WITH GFR    No orders of the defined types were placed in this encounter.   Return if  symptoms worsen or fail to improve.  Rubie Maid, FNP

## 2022-01-01 NOTE — ED Triage Notes (Signed)
Patient here POV from Home.  Endorses Right Sided Lower ABD Pain that began 3 Weeks ago. Worsened since it began.   Sent by PCP for Imaging. No Other Discernable Symptoms such as N/V/D.   NAD Noted during Triage. A&Ox4. GCS 15. Ambulatory.

## 2022-01-01 NOTE — ED Provider Notes (Signed)
[place] EMERGENCY DEPT Provider Note   CSN: [id number] Arrival date & time: 01/01/22  1114     History  Chief Complaint  Patient presents with   Abdominal Pain    SHIESHA JAHN is a 71 y.o. female.  Pt c/o mid to lower abd pain in the past 1-2 weeks. Symptoms acute onset, moderate, persistent, waxing and waning. No consistent or specific exacerbating or alleviating factors. Remote hx cholecystectomy and hysterectomy. Denies dysuria or gu c/o. No vomiting or diarrhea. Hx ventral hernias, but not acutely or persistently painful or swollen. No chest pain or sob. No cough or uri symptoms. Pt indicates was sent to ED from PCP for CT scan and ED workup.   The history is provided by the patient and medical records.  Abdominal Pain Associated symptoms: no chest pain, no chills, no cough, no diarrhea, no dysuria, no fever, no shortness of breath, no sore throat and no vomiting        Home Medications Prior to Admission medications   Medication Sig Start Date End Date Taking? Authorizing Provider  Alcohol Swabs (DROPSAFE ALCOHOL PREP) 70 % PADS USE AS DIRECTED TO MONITOR BLOOD GLUCOSE UP TO THREE TIMES DAILY 01/21/21   Susy Frizzle, MD  alendronate (FOSAMAX) 70 MG tablet TAKE 1 TABLET EVERY 7 DAYS WITH A FULL GLASS OF WATER ON AN EMPTY STOMACH 08/07/21   Susy Frizzle, MD  Ascorbic Acid (VITAMIN C) 1000 MG tablet Take 1,000 mg by mouth daily.    [provider]  aspirin 81 MG chewable tablet Chew by mouth daily.    [provider]  atorvastatin (LIPITOR) 40 MG tablet TAKE 1 TABLET EVERY DAY 11/03/21   Susy Frizzle, MD  azithromycin (ZITHROMAX) 250 MG tablet 2 tabs poqday1, 1 tab poqday 2-5 09/29/21   Susy Frizzle, MD  BIOTIN 5000 PO Take by mouth.    [provider]  Blood Glucose Calibration (TRUE METRIX LEVEL 1) Low SOLN Use as directed to monitor FSBS 1x daily. Dx: E11.9 02/19/20   Susy Frizzle, MD  Blood Glucose  Monitoring Suppl (TRUE METRIX METER) w/Device KIT USE AS DIRECTED 04/25/20   Susy Frizzle, MD  Calcium Carbonate-Vit D-Min (CALCIUM 1200) 1200-1000 MG-UNIT CHEW Chew by mouth.    [provider]  Cinnamon 500 MG TABS Take by mouth.    [provider]  fluticasone (FLONASE) 50 MCG/ACT nasal spray Place 2 sprays into both nostrils daily. 08/05/21   Susy Frizzle, MD  glucose blood test strip Use as instructed 02/15/20   Susy Frizzle, MD  HYDROcodone-acetaminophen (NORCO) 5-325 MG tablet Take 1 tablet by mouth every 6 (six) hours as needed for moderate pain. 08/05/21   Susy Frizzle, MD  losartan (COZAAR) 25 MG tablet TAKE 1 TABLET EVERY DAY 08/05/21   Susy Frizzle, MD  metFORMIN (GLUCOPHAGE) 1000 MG tablet TAKE 1 TABLET TWICE DAILY WITH A MEAL 08/05/21   Susy Frizzle, MD  Multiple Vitamin (MULTIVITAMIN WITH MINERALS) TABS tablet Take 1 tablet by mouth daily.    [provider]  Omega-3 Fatty Acids (FISH OIL) 1200 MG CPDR Take by mouth 2 (two) times daily.    [provider]  oxybutynin (DITROPAN) 5 MG tablet TAKE 1 TABLET FOUR TIMES DAILY 08/05/21   Susy Frizzle, MD  pantoprazole (PROTONIX) 40 MG tablet Take 1 tablet (40 mg total) by mouth 2 (two) times daily. 08/05/21   Susy Frizzle, MD  pioglitazone (  ACTOS) 30 MG tablet Take 1 tablet (30 mg total) by mouth daily. 08/05/21   Susy Frizzle, MD  triamcinolone cream (KENALOG) 0.1 % Apply topically 2 (two) times daily. 08/05/21   Susy Frizzle, MD  TRUEplus Lancets 33G MISC Use as directed to monitor FSBS 1x daily. Dx: E11.9 02/15/20   Susy Frizzle, MD  zinc gluconate 50 MG tablet Take 50 mg by mouth daily.    [provider]      Allergies    Ivp dye [iodinated contrast media] and Penicillins    Review of Systems   Review of Systems  Constitutional:  Negative for chills and fever.  HENT:  Negative for sore throat.   Eyes:  Negative for redness.  Respiratory:   Negative for cough and shortness of breath.   Cardiovascular:  Negative for chest pain.  Gastrointestinal:  Positive for abdominal pain. Negative for diarrhea and vomiting.  Genitourinary:  Negative for dysuria and flank pain.  Musculoskeletal:  Negative for back pain.  Skin:  Negative for rash.  Neurological:  Negative for headaches.  Hematological:  Does not bruise/bleed easily.  Psychiatric/Behavioral:  Negative for confusion.     Physical Exam Updated Vital Signs BP 104/76 (BP Location: Right Arm)   Pulse 100   Temp 98.5 F (36.9 C) (Oral)   Resp 20   Ht 1.6 m (_0 )   Wt 66.2 kg   SpO2 95%   BMI 25.85 kg/m  Physical Exam Vitals and nursing note reviewed.  Constitutional:      Appearance: Normal appearance. She is well-developed.  HENT:     Head: Atraumatic.     Nose: Nose normal.     Mouth/Throat:     Mouth: Mucous membranes are moist.  Eyes:     General: No scleral icterus.    Conjunctiva/sclera: Conjunctivae normal.  Neck:     Trachea: No tracheal deviation.  Cardiovascular:     Rate and Rhythm: Normal rate and regular rhythm.     Pulses: Normal pulses.     Heart sounds: Normal heart sounds. No murmur heard.    No friction rub. No gallop.  Pulmonary:     Effort: Pulmonary effort is normal. No respiratory distress.     Breath sounds: Normal breath sounds.  Abdominal:     General: Bowel sounds are normal. There is no distension.     Palpations: Abdomen is soft.     Tenderness: There is abdominal tenderness. There is no guarding.     Comments: Mid to lower abd tenderness, moderate. Soft, reducible ventral hernias, more superior one smaller, supraumbilical one larger - both reducible.    Genitourinary:    Comments: No cva tenderness.  Musculoskeletal:        General: No swelling or tenderness.     Cervical back: Normal range of motion and neck supple. No rigidity. No muscular tenderness.  Skin:    General: Skin is warm and dry.     Findings: No rash.   Neurological:     Mental Status: She is alert.     Comments: Alert, speech normal.   Psychiatric:        Mood and Affect: Mood normal.     ED Results / Procedures / Treatments   Labs (all labs ordered are listed, but only abnormal results are displayed) Results for orders placed or performed during the hospital encounter of 01/01/22  Lipase, blood  Result Value Ref Range   Lipase 23 11 - 51  U/L  Comprehensive metabolic panel  Result Value Ref Range   Sodium 137 135 - 145 mmol/L   Potassium 4.1 3.5 - 5.1 mmol/L   Chloride 100 98 - 111 mmol/L   CO2 22 22 - 32 mmol/L   Glucose, Bld 124 (H) 70 - 99 mg/dL   BUN 13 8 - 23 mg/dL   Creatinine, Ser 0.90 0.44 - 1.00 mg/dL   Calcium 9.4 8.9 - 10.3 mg/dL   Total Protein 7.5 6.5 - 8.1 g/dL   Albumin 4.3 3.5 - 5.0 g/dL   AST 21 15 - 41 U/L   ALT 14 0 - 44 U/L   Alkaline Phosphatase 58 38 - 126 U/L   Total Bilirubin 0.6 0.3 - 1.2 mg/dL   GFR, Estimated >60 >60 mL/min   Anion gap 15 5 - 15  CBC  Result Value Ref Range   WBC 11.5 (H) 4.0 - 10.5 K/uL   RBC 4.24 3.87 - 5.11 MIL/uL   Hemoglobin 12.4 12.0 - 15.0 g/dL   HCT 37.0 36.0 - 46.0 %   MCV 87.3 80.0 - 100.0 fL   MCH 29.2 26.0 - 34.0 pg   MCHC 33.5 30.0 - 36.0 g/dL   RDW 14.9 11.5 - 15.5 %   Platelets 276 150 - 400 K/uL   nRBC 0.0 0.0 - 0.2 %     EKG None  Radiology CT ABDOMEN PELVIS WO CONTRAST  Result Date: 01/01/2022 CLINICAL DATA:  Abdominal pain, acute, nonlocalized abd pain. Endorses Right Sided Lower ABD Pain that began 3 Weeks ago. Worsened since it began. HX DM, HTN, IBS, GERD EXAM: CT ABDOMEN AND PELVIS WITHOUT CONTRAST TECHNIQUE: Multidetector CT imaging of the abdomen and pelvis was performed following the standard protocol without IV contrast. RADIATION DOSE REDUCTION: This exam was performed according to the departmental dose-optimization program which includes automated exposure control, adjustment of the mA and/or kV according to patient size and/or use of  iterative reconstruction technique. COMPARISON:  CT abdomen pelvis 04/13/2011 report without imaging FINDINGS: Lower chest: No acute abnormality.  Tiny hiatal hernia. Hepatobiliary: Question nodular hepatic contour. No focal liver abnormality. The gallbladder is not identified and may be surgically absent. No biliary dilatation. Pancreas: Unremarkable. No pancreatic ductal dilatation or surrounding inflammatory changes. Spleen: Normal in size without focal abnormality. Adrenals/Urinary Tract: No adrenal nodule bilaterally. Right renal cortical scarring. No nephrolithiasis and no hydronephrosis. Pericentimeter fluid dense lesion within the right kidney likely represent simple renal cysts. Simple renal cysts, in the absence of clinically indicated signs/symptoms, require no independent follow-up. No ureterolithiasis or hydroureter. The urinary bladder is unremarkable. Stomach/Bowel: Stomach is within normal limits. No evidence of bowel wall thickening or dilatation. No pneumatosis. Colonic diverticulosis. Appendix appears normal. Vascular/Lymphatic: No abdominal aorta or iliac aneurysm. Severe atherosclerotic plaque of the aorta and its branches. No abdominal, pelvic, or inguinal lymphadenopathy. Reproductive: Status post hysterectomy. No adnexal masses. Other: No intraperitoneal free fluid. No intraperitoneal free gas. No organized fluid collection. Musculoskeletal: Small to moderate volume umbilical hernia containing a short loop of mid transverse colon and omentum with associated abdominal defect of 3.4 x 2 cm. No suspicious lytic or blastic osseous lesions. No acute displaced fracture. Multilevel degenerative changes of the spine. IMPRESSION: 1. Small to moderate volume umbilical hernia containing a short loop of mid transverse colon and omentum. Associated abdominal defect of 3.4 x 2 cm. No definite findings of associated bowel obstruction. No pneumatosis with limited evaluation for ischemia of the contained  bowel on this noncontrast  study. Slight stranding of the contained fat. Recommend correlation with physical exam for signs and symptoms of incarceration. 2. Colonic diverticulosis with no acute diverticulitis. 3. Tiny hiatal hernia. 4. Question nodular hepatic contour with no other signs of a cirrhotic morphology. Consider follow-up with outpatient ultrasound. Electronically Signed   By: Iven Finn M.D.   On: 01/01/2022 14:44    Procedures Procedures    Medications Ordered in ED Medications - No data to display  ED Course/ Medical Decision Making/ A&P                           Medical Decision Making Problems Addressed: Abdominal pain, acute, generalized: acute illness or injury with systemic symptoms that poses a threat to life or bodily functions Ventral hernia without obstruction or gangrene: chronic illness or injury that poses a threat to life or bodily functions  Amount and/or Complexity of Data Reviewed External Data Reviewed: notes. Labs: ordered. Decision-making details documented in ED Course. Radiology: ordered and independent interpretation performed. Decision-making details documented in ED Course.  Risk OTC drugs. Decision regarding hospitalization.   Iv ns. Continuous pulse ox and cardiac monitoring. Labs ordered/sent. Imaging ordered.   Diff dx includes diverticulitis, incarcerated hernia, appendicitis, bowel gas pain, constipation, viral syndrome, etc - dispo decision including potential need for admission considered - will get labs and imaging and reassess.   Reviewed nursing notes and prior charts for additional history. External reports reviewed.   Cardiac monitor: sinus rhythm, rate 110.  Labs reviewed/interpreted by me - wbc mildly high.   CT reviewed/interpreted by me - ventral/supraumbilical hernia.   On recheck, patients hernia is soft, non tender, and it is easily reducible.   On repeat abd exam, soft, non tender. No pain. No nv. Pt denies any  gu c/o. No fever or chills.  Recheck hr 88, rr 16. No nv.  Pt currently appears stable for d/c.   Rec gen surg f/u.  Return precautions provided.            Final Clinical Impression(s) / ED Diagnoses Final diagnoses:  None    Rx / DC Orders ED Discharge Orders     None         Lajean Saver, MD 01/01/22 1507

## 2022-01-01 NOTE — Assessment & Plan Note (Signed)
Patients presentation today is concerning for acute abdominal process, referred to ED for imaging to evaluate for possible appendicitis. On exam her abdomen was tender to soft palpation diffusely but worse on the right side. Her hernia is compressible and bowel sounds are present. Vital signs are stable and she is not ill-appearing. CBC and CMP were drawn in office.

## 2022-01-02 LAB — COMPLETE METABOLIC PANEL WITH GFR
AG Ratio: 1.3 (calc) (ref 1.0–2.5)
ALT: 12 U/L (ref 6–29)
AST: 18 U/L (ref 10–35)
Albumin: 4.1 g/dL (ref 3.6–5.1)
Alkaline phosphatase (APISO): 64 U/L (ref 37–153)
BUN: 13 mg/dL (ref 7–25)
CO2: 20 mmol/L (ref 20–32)
Calcium: 9.4 mg/dL (ref 8.6–10.4)
Chloride: 102 mmol/L (ref 98–110)
Creat: 0.96 mg/dL (ref 0.60–1.00)
Globulin: 3.1 g/dL (calc) (ref 1.9–3.7)
Glucose, Bld: 113 mg/dL — ABNORMAL HIGH (ref 65–99)
Potassium: 3.9 mmol/L (ref 3.5–5.3)
Sodium: 137 mmol/L (ref 135–146)
Total Bilirubin: 0.5 mg/dL (ref 0.2–1.2)
Total Protein: 7.2 g/dL (ref 6.1–8.1)
eGFR: 64 mL/min/{1.73_m2} (ref >=60)

## 2022-01-02 LAB — CBC WITH DIFFERENTIAL/PLATELET
Absolute Monocytes: 608 cells/uL (ref 200–950)
Basophils Absolute: 41 cells/uL (ref 0–200)
Basophils Relative: 0.4 %
Eosinophils Absolute: 196 cells/uL (ref 15–500)
Eosinophils Relative: 1.9 %
HCT: 37 % (ref 35.0–45.0)
Hemoglobin: 12.3 g/dL (ref 11.7–15.5)
Lymphs Abs: 1700 cells/uL (ref 850–3900)
MCH: 29.1 pg (ref 27.0–33.0)
MCHC: 33.2 g/dL (ref 32.0–36.0)
MCV: 87.7 fL (ref 80.0–100.0)
MPV: 9.6 fL (ref 7.5–12.5)
Monocytes Relative: 5.9 %
Neutro Abs: 7756 cells/uL (ref 1500–7800)
Neutrophils Relative %: 75.3 %
Platelets: 295 10*3/uL (ref 140–400)
RBC: 4.22 10*6/uL (ref 3.80–5.10)
RDW: 13.9 % (ref 11.0–15.0)
Total Lymphocyte: 16.5 %
WBC: 10.3 10*3/uL (ref 3.8–10.8)

## 2022-01-14 ENCOUNTER — Other Ambulatory Visit: Payer: Self-pay | Admitting: Family Medicine

## 2022-01-14 DIAGNOSIS — J019 Acute sinusitis, unspecified: Secondary | ICD-10-CM

## 2022-01-15 ENCOUNTER — Other Ambulatory Visit: Payer: Self-pay | Admitting: Family Medicine

## 2022-01-15 ENCOUNTER — Telehealth: Payer: Self-pay | Admitting: Family Medicine

## 2022-01-15 NOTE — Telephone Encounter (Signed)
Please note patient declined mammogram. She doesn't want to have one done.

## 2022-01-15 NOTE — Telephone Encounter (Signed)
Requested Prescriptions  Pending Prescriptions Disp Refills   fluticasone (FLONASE) 50 MCG/ACT nasal spray [Pharmacy Med Name: FLUTICASONE PROPIONATE 50 MCG/ACT Suspension] 48 g 1    Sig: USE 2 SPRAYS IN EACH NOSTRIL EVERY DAY     Ear, Nose, and Throat: Nasal Preparations - Corticosteroids Failed - 01/14/2022 10:59 PM      Failed - Valid encounter within last 12 months    Recent Outpatient Visits           1 year ago OAB (overactive bladder)   [place] Susy Frizzle, MD   1 year ago Diabetes mellitus without complication [facility])   [place] Pickard, Cammie Mcgee, MD   1 year ago Foot pain, right   [place] Pickard, Cammie Mcgee, MD   1 year ago Diabetes mellitus without complication [facility])   [facility] Pickard, Cammie Mcgee, MD   2 years ago Pure hypercholesterolemia   [place] Pickard, Cammie Mcgee, MD

## 2022-01-15 NOTE — Telephone Encounter (Signed)
Requested medication (s) are due for refill today: routing for approval  Requested medication (s) are on the active medication list: yes  Last refill:  01/21/21  Future visit scheduled: yes  Notes to clinic:   Medication not assigned to a protocol, review manually.      Requested Prescriptions  Pending Prescriptions Disp Refills   Alcohol Swabs (DROPSAFE ALCOHOL PREP) 70 % PADS [Pharmacy Med Name: DROPSAFE ALCOHOL PREP PADS 70 % Pad] 300 each 3    Sig: USE AS DIRECTED PRIOR TO MONITORING BLOOD GLUCOSE UP TO THREE TIMES DAILY     Off-Protocol Failed - 01/15/2022  4:42 AM      Failed - Medication not assigned to a protocol, review manually.      Failed - Valid encounter within last 12 months    Recent Outpatient Visits           1 year ago OAB (overactive bladder)   [place] Susy Frizzle, MD   1 year ago Diabetes mellitus without complication [facility])   [place] Pickard, Cammie Mcgee, MD   1 year ago Foot pain, right   [place] Pickard, Cammie Mcgee, MD   1 year ago Diabetes mellitus without complication [facility])   [facility] Pickard, Cammie Mcgee, MD   2 years ago Pure hypercholesterolemia   [place] Pickard, Cammie Mcgee, MD

## 2022-02-19 ENCOUNTER — Other Ambulatory Visit: Payer: Self-pay | Admitting: Family Medicine

## 2022-02-19 DIAGNOSIS — N3281 Overactive bladder: Secondary | ICD-10-CM

## 2022-02-19 NOTE — Telephone Encounter (Signed)
Requested Prescriptions  Pending Prescriptions Disp Refills   oxybutynin (DITROPAN) 5 MG tablet [Pharmacy Med Name: OXYBUTYNIN CHLORIDE 5 MG Tablet] 360 tablet 0    Sig: TAKE 1 TABLET FOUR TIMES DAILY     Urology:  Bladder Agents Failed - 02/19/2022 10:45 AM      Failed - Valid encounter within last 12 months    Recent Outpatient Visits           1 year ago OAB (overactive bladder)   [place] Susy Frizzle, MD   1 year ago Diabetes mellitus without complication [facility])   [place] Pickard, Cammie Mcgee, MD   1 year ago Foot pain, right   [place] Pickard, Cammie Mcgee, MD   1 year ago Diabetes mellitus without complication [facility])   [facility] Pickard, Cammie Mcgee, MD   2 years ago Pure hypercholesterolemia   [place] Pickard, Cammie Mcgee, MD

## 2022-03-02 ENCOUNTER — Other Ambulatory Visit: Payer: Self-pay | Admitting: Family Medicine

## 2022-03-02 DIAGNOSIS — Z1231 Encounter for screening mammogram for malignant neoplasm of breast: Secondary | ICD-10-CM

## 2022-03-06 ENCOUNTER — Ambulatory Visit (INDEPENDENT_AMBULATORY_CARE_PROVIDER_SITE_OTHER): Payer: Medicare PPO

## 2022-03-06 DIAGNOSIS — Z0000 Encounter for general adult medical examination without abnormal findings: Secondary | ICD-10-CM | POA: Diagnosis not present

## 2022-03-06 NOTE — Progress Notes (Signed)
Subjective:   Rhonda Fields is a 72 y.o. female who presents for Medicare Annual (Subsequent) preventive examination.  Review of Systems     Cardiac Risk Factors include: advanced age (>41mn, >>58women);diabetes mellitus;smoking/ tobacco exposure;sedentary lifestyle;hypertension     Objective:    There were no vitals filed for this visit. There is no height or weight on file to calculate BMI.     03/06/2022    8:47 AM 01/01/2022   11:23 AM 02/28/2021    8:42 AM 04/20/2011    1:52 PM  Advanced Directives  Does Patient Have a Medical Advance Directive? No No No Patient does not have advance directive  Would patient like information on creating a medical advance directive?  No - Patient declined No - Patient declined   Pre-existing out of facility DNR order (yellow form or pink MOST form)    No    Current Medications (verified) Outpatient Encounter Medications as of 03/06/2022  Medication Sig   Alcohol Swabs (DROPSAFE ALCOHOL PREP) 70 % PADS USE AS DIRECTED PRIOR TO MONITORING BLOOD GLUCOSE UP TO THREE TIMES DAILY   alendronate (FOSAMAX) 70 MG tablet TAKE 1 TABLET EVERY 7 DAYS WITH A FULL GLASS OF WATER ON AN EMPTY STOMACH   Ascorbic Acid (VITAMIN C) 1000 MG tablet Take 1,000 mg by mouth daily.   aspirin 81 MG chewable tablet Chew by mouth daily.   atorvastatin (LIPITOR) 40 MG tablet TAKE 1 TABLET EVERY DAY   azithromycin (ZITHROMAX) 250 MG tablet 2 tabs poqday1, 1 tab poqday 2-5   BIOTIN 5000 PO Take by mouth.   Blood Glucose Calibration (TRUE METRIX LEVEL 1) Low SOLN Use as directed to monitor FSBS 1x daily. Dx: E11.9   Blood Glucose Monitoring Suppl (TRUE METRIX METER) w/Device KIT USE AS DIRECTED   Calcium Carbonate-Vit D-Min (CALCIUM 1200) 1200-1000 MG-UNIT CHEW Chew by mouth.   Cinnamon 500 MG TABS Take by mouth.   fluticasone (FLONASE) 50 MCG/ACT nasal spray USE 2 SPRAYS IN EACH NOSTRIL EVERY DAY   glucose blood test strip Use as instructed    HYDROcodone-acetaminophen (NORCO) 5-325 MG tablet Take 1 tablet by mouth every 6 (six) hours as needed for moderate pain.   losartan (COZAAR) 25 MG tablet TAKE 1 TABLET EVERY DAY   metFORMIN (GLUCOPHAGE) 1000 MG tablet TAKE 1 TABLET TWICE DAILY WITH A MEAL   Multiple Vitamin (MULTIVITAMIN WITH MINERALS) TABS tablet Take 1 tablet by mouth daily.   Omega-3 Fatty Acids (FISH OIL) 1200 MG CPDR Take by mouth 2 (two) times daily.   oxybutynin (DITROPAN) 5 MG tablet TAKE 1 TABLET FOUR TIMES DAILY   pantoprazole (PROTONIX) 40 MG tablet Take 1 tablet (40 mg total) by mouth 2 (two) times daily.   pioglitazone (ACTOS) 30 MG tablet Take 1 tablet (30 mg total) by mouth daily.   triamcinolone cream (KENALOG) 0.1 % Apply topically 2 (two) times daily.   TRUEplus Lancets 33G MISC Use as directed to monitor FSBS 1x daily. Dx: E11.9   zinc gluconate 50 MG tablet Take 50 mg by mouth daily.   No facility-administered encounter medications on file as of 03/06/2022.    Allergies (verified) Ivp dye [iodinated contrast media] and Penicillins   History: Past Medical History:  Diagnosis Date   Anxiety    Arrhythmia    Chronic headaches    Depression    Diabetes mellitus without complication (HCC)    GERD (gastroesophageal reflux disease)    Hard of hearing    IBS (  irritable bowel syndrome) 1986   Past Surgical History:  Procedure Laterality Date   ABDOMINAL HYSTERECTOMY     CHOLECYSTECTOMY     ERCP  04/20/2011   Procedure: ENDOSCOPIC RETROGRADE CHOLANGIOPANCREATOGRAPHY (ERCP);  Surgeon: Inda Castle, MD;  Location: [facility];  Service: Endoscopy;  Laterality: N/A;  case is at 1430 in or   ERCP  04/20/2011   Procedure: ENDOSCOPIC RETROGRADE CHOLANGIOPANCREATOGRAPHY (ERCP);  Surgeon: Inda Castle, MD;  Location: [facility];  Service: Gastroenterology;  Laterality: N/A;   Family History  Adopted: Yes  Problem Relation Age of Onset   Diabetes Other        Siblings, both sets of grandparents   Liver  disease Mother    Kidney disease Mother    Liver disease Brother    Malignant hyperthermia Neg Hx    Social History   Socioeconomic History   Marital status: Widowed    Spouse name: Not on file   Number of children: 4   Years of education: Not on file   Highest education level: Not on file  Occupational History   Occupation: Unemployed  Tobacco Use   Smoking status: Every Day    Packs/day: 1.00    Years: 40.00    Total pack years: 40.00    Types: Cigarettes   Smokeless tobacco: Never  Substance and Sexual Activity   Alcohol use: No   Drug use: No   Sexual activity: Not on file  Other Topics Concern   Not on file  Social History Narrative   Daughter lives next door.   Widowed. Was married x 37 years.   Social Determinants of Health   Financial Resource Strain: Low Risk  (03/06/2022)   Overall Financial Resource Strain (CARDIA)    Difficulty of Paying Living Expenses: Not very hard  Food Insecurity: No Food Insecurity (03/06/2022)   Hunger Vital Sign    Worried About Running Out of Food in the Last Year: Never true    Ran Out of Food in the Last Year: Never true  Transportation Needs: No Transportation Needs (03/06/2022)   PRAPARE - Hydrologist (Medical): No    Lack of Transportation (Non-Medical): No  Physical Activity: Insufficiently Active (03/06/2022)   Exercise Vital Sign    Days of Exercise per Week: 2 days    Minutes of Exercise per Session: 10 min  Stress: No Stress Concern Present (03/06/2022)   [place]    Feeling of Stress : Only a little  Social Connections: Socially Isolated (03/06/2022)   Social Connection and Isolation Panel [NHANES]    Frequency of Communication with Friends and Family: More than three times a week    Frequency of Social Gatherings with Friends and Family: More than three times a week    Attends Religious Services: Never    Corporate treasurer or Organizations: No    Attends Archivist Meetings: Never    Marital Status: Widowed    Tobacco Counseling Ready to quit: Not Answered Counseling given: Not Answered   Clinical Intake:  Pre-visit preparation completed: Yes  Pain : No/denies pain     BMI - recorded: 25.87 Nutritional Status: BMI 25 -29 Overweight Diabetes: Yes  How often do you need to have someone help you when you read instructions, pamphlets, or other written materials from your doctor or pharmacy?: 1 - Never What is the last grade level you completed in school?: college  Diabetic?Yes  Interpreter Needed?: No      Activities of Daily Living    03/06/2022    8:47 AM  In your present state of health, do you have any difficulty performing the following activities:  Hearing? 1  Comment can't afford hearing aids/insurance won't cover refuse resources  Vision? 1  Difficulty concentrating or making decisions? 0  Walking or climbing stairs? 0  Dressing or bathing? 0  Doing errands, shopping? 0  Preparing Food and eating ? N  Using the Toilet? N  In the past six months, have you accidently leaked urine? Y  Do you have problems with loss of bowel control? N  Managing your Medications? N  Managing your Finances? N  Housekeeping or managing your Housekeeping? N    Patient Care Team: Susy Frizzle, MD as PCP - General (Family Medicine) Edythe Clarity, [facility] as Pharmacist (Pharmacist)  Indicate any recent Medical Services you may have received from other than [facility] providers in the past year (date may be approximate).     Assessment:   This is a routine wellness examination for [place].  Hearing/Vision screen No results found.  Dietary issues and exercise activities discussed: Current Exercise Habits: Home exercise routine (some walking), Type of exercise: walking, Intensity: Mild, Exercise limited by: orthopedic condition(s);Other - see comments (back pains)   Goals  Addressed             This Visit's Progress    Exercise 3x per week (30 min per time)   On track    Try to move more, gentle stretching and chair exercises.        Depression Screen    03/06/2022    8:45 AM 03/06/2022    8:44 AM 09/29/2021   12:19 PM 02/28/2021    8:30 AM 05/14/2020    8:06 AM 03/29/2017   11:30 AM 04/17/2013    9:56 AM  PHQ 2/9 Scores  PHQ - 2 Score 0 0 2 1 4 2 $ 0  PHQ- 9 Score 0 0 8  15 6     $ Fall Risk    09/29/2021   12:20 PM 02/28/2021    8:43 AM 05/14/2020    8:06 AM 03/29/2017   11:30 AM 04/17/2013    9:56 AM  Fall Risk   Falls in the past year? 1 1 0 No No  Number falls in past yr: 1 0     Injury with Fall? 1 0     Risk for fall due to : History of fall(s);Impaired balance/gait Impaired balance/gait;History of fall(s) No Fall Risks    Follow up Falls prevention discussed Falls prevention discussed Falls evaluation completed      FALL RISK PREVENTION PERTAINING TO THE HOME:  Any stairs in or around the home? No  If so, are there any without handrails? No  Home free of loose throw rugs in walkways, pet beds, electrical cords, etc? No  Adequate lighting in your home to reduce risk of falls? Yes   ASSISTIVE DEVICES UTILIZED TO PREVENT FALLS:  Life alert? No  Use of a cane, walker or w/c? No  Grab bars in the bathroom? No  Shower chair or bench in shower? No  Elevated toilet seat or a handicapped toilet? No   TIMED UP AND GO:  Was the test performed? No .  Length of time to ambulate 10 feet: n/a sec.     Cognitive Function:        03/06/2022    8:50  AM 02/28/2021    8:46 AM  6CIT Screen  What Year? 0 points 0 points  What month? 0 points 0 points  What time? 0 points 0 points  Count back from 20 0 points 0 points  Months in reverse 0 points 0 points  Repeat phrase 2 points 2 points  Total Score 2 points 2 points    Immunizations Immunization History  Administered Date(s) Administered   PFIZER(Purple Top)SARS-COV-2 Vaccination  03/15/2019, 04/12/2019   Pneumococcal Polysaccharide-23 07/30/2014, 08/10/2019    TDAP status: Due, Education has been provided regarding the importance of this vaccine. Advised may receive this vaccine at local pharmacy or Health Dept. Aware to provide a copy of the vaccination record if obtained from local pharmacy or Health Dept. Verbalized acceptance and understanding.  Flu Vaccine status: Declined, Education has been provided regarding the importance of this vaccine but patient still declined. Advised may receive this vaccine at local pharmacy or Health Dept. Aware to provide a copy of the vaccination record if obtained from local pharmacy or Health Dept. Verbalized acceptance and understanding.  Pneumococcal vaccine status: Up to date  Covid-19 vaccine status: Declined, Education has been provided regarding the importance of this vaccine but patient still declined. Advised may receive this vaccine at local pharmacy or Health Dept.or vaccine clinic. Aware to provide a copy of the vaccination record if obtained from local pharmacy or Health Dept. Verbalized acceptance and understanding.  Qualifies for Shingles Vaccine?  Will get at pharmacy   Zostavax completed No   Shingrix Completed?: No.    Education has been provided regarding the importance of this vaccine. Patient has been advised to call insurance company to determine out of pocket expense if they have not yet received this vaccine. Advised may also receive vaccine at local pharmacy or Health Dept. Verbalized acceptance and understanding.  Screening Tests Health Maintenance  Topic Date Due   DTaP/Tdap/Td (1 - Tdap) Never done   Lung Cancer Screening  Never done   Zoster Vaccines- Shingrix (1 of 2) Never done   COLONOSCOPY (Pts 45-61yr Insurance coverage will need to be confirmed)  05/04/2014   Pneumonia Vaccine 71 Years old (2 of 2 - PCV) 08/09/2020   FOOT EXAM  08/09/2020   Diabetic kidney evaluation - Urine ACR  05/13/2021    INFLUENZA VACCINE  Never done   COVID-19 Vaccine (3 - 2023-24 season) 09/19/2021   HEMOGLOBIN A1C  02/04/2022   MAMMOGRAM  01/16/2023 (Originally 09/07/2001)   OPHTHALMOLOGY EXAM  08/28/2022   Diabetic kidney evaluation - eGFR measurement  01/02/2023   Medicare Annual Wellness (AWV)  03/07/2023   DEXA SCAN  Completed   Hepatitis C Screening  Completed   HPV VACCINES  Aged Out    Health Maintenance  Health Maintenance Due  Topic Date Due   DTaP/Tdap/Td (1 - Tdap) Never done   Lung Cancer Screening  Never done   Zoster Vaccines- Shingrix (1 of 2) Never done   COLONOSCOPY (Pts 45-43yrInsurance coverage will need to be confirmed)  05/04/2014   Pneumonia Vaccine 6514Years old (2 of 2 - PCV) 08/09/2020   FOOT EXAM  08/09/2020   Diabetic kidney evaluation - Urine ACR  05/13/2021   INFLUENZA VACCINE  Never done   COVID-19 Vaccine (3 - 2023-24 season) 09/19/2021   HEMOGLOBIN A1C  02/04/2022    Colorectal cancer screening: Referral to GI placed send referral 03/06/22. Pt aware the office will call re: appt.  Mammogram status: schedule for 03/02/22 Bone Density  status: Completed 6/22. Results reflect: Bone density results: OSTEOPOROSIS. Repeat every 2 years.  Lung Cancer Screening: (Low Dose CT Chest recommended if Age 66-80 years, 30 pack-year currently smoking OR have quit w/in 15years.) does qualify.   Lung Cancer Screening Referral: referral sent 03/06/22  Additional Screening:  Hepatitis C Screening: does not qualify; Completed n/a  Vision Screening: Recommended annual ophthalmology exams for early detection of glaucoma and other disorders of the eye. Is the patient up to date with their annual eye exam?  Yes  Who is the provider or what is the name of the office in which the patient attends annual eye exams? Per pt don't remember If pt is not established with a provider, would they like to be referred to a provider to establish care? Yes .   Dental Screening: Recommended annual  dental exams for proper oral hygiene  Community Resource Referral / Chronic Care Management: CRR required this visit?  No   CCM required this visit?  No      Plan:     I have personally reviewed and noted the following in the patient's chart:   Medical and social history Use of alcohol, tobacco or illicit drugs  Current medications and supplements including opioid prescriptions. Patient is currently taking opioid prescriptions. Information provided to patient regarding non-opioid alternatives. Patient advised to discuss non-opioid treatment plan with their provider. Functional ability and status Nutritional status Physical activity Advanced directives List of other physicians Hospitalizations, surgeries, and ER visits in previous 12 months Vitals Screenings to include cognitive, depression, and falls Referrals and appointments  In addition, I have reviewed and discussed with patient certain preventive protocols, quality metrics, and best practice recommendations. A written personalized care plan for preventive services as well as general preventive health recommendations were provided to patient.   Virtual Visit via Telephone Note  I connected with HARSHA MALOY on 03/06/22 at  8:15 AM EST by telephone and verified that I am speaking with the correct person using two identifiers.  Location: Patient: home  Provider: clinic   I discussed the limitations, risks, security and privacy concerns of performing an evaluation and management service by telephone and the availability of in person appointments. I also discussed with the patient that there may be a patient responsible charge related to this service. The patient expressed understanding and agreed to proceed.   History of Present Illness:    Observations/Objective:   Assessment and Plan:   Follow Up Instructions:    I discussed the assessment and treatment plan with the patient. The patient was provided  an opportunity to ask questions and all were answered. The patient agreed with the plan and demonstrated an understanding of the instructions.   The patient was advised to call back or seek an in-person evaluation if the symptoms worsen or if the condition fails to improve as anticipated.  I provided 30 minutes of non-face-to-face time during this encounter.   Colman Cater, [place], [place]   03/06/2022   Nurse Notes: n/a

## 2022-03-10 NOTE — Progress Notes (Signed)
I have collaborated with the care management provider regarding care management and care coordination activities outlined in this encounter and have reviewed this encounter including documentation in the note and care plan. I am certifying that I agree with the content of this note and encounter as supervising physician.  

## 2022-03-12 ENCOUNTER — Other Ambulatory Visit: Payer: Self-pay | Admitting: Family Medicine

## 2022-03-12 NOTE — Telephone Encounter (Signed)
Refilled 08/05/2021 #90 2 rf. Requested Prescriptions  Pending Prescriptions Disp Refills   triamcinolone cream (KENALOG) 0.1 % [Pharmacy Med Name: TRIAMCINOLONE ACETONIDE 0.1 % Cream] 30 g 11    Sig: APPLY TOPICALLY 2 (TWO) TIMES DAILY.     Not Delegated - Dermatology:  Corticosteroids Failed - 03/12/2022 10:09 AM      Failed - This refill cannot be delegated      Failed - Valid encounter within last 12 months    Recent Outpatient Visits           1 year ago OAB (overactive bladder)   [place] Rhonda Frizzle, MD   1 year ago Diabetes mellitus without complication ([place])   [place] Pickard, Cammie Mcgee, MD   1 year ago Foot pain, right   [place] Schaumann, Cammie Mcgee, MD   1 year ago Diabetes mellitus without complication ([place])   [place] Pickard, Cammie Mcgee, MD   2 years ago Pure hypercholesterolemia   [place] Pickard, Cammie Mcgee, MD              Refused Prescriptions Disp Refills   losartan (COZAAR) 25 MG tablet [Pharmacy Med Name: LOSARTAN POTASSIUM 25 MG Tablet] 90 tablet 3    Sig: TAKE 1 TABLET EVERY DAY (DISCONTINUE LOSARTAN 50 MG)     Cardiovascular:  Angiotensin Receptor Blockers Failed - 03/12/2022 10:09 AM      Failed - Last BP in normal range    BP Readings from Last 1 Encounters:  01/01/22 (!) 105/91         Failed - Valid encounter within last 6 months    Recent Outpatient Visits           1 year ago OAB (overactive bladder)   [place] Rhonda Frizzle, MD   1 year ago Diabetes mellitus without complication ([place])   [place] Pickard, Cammie Mcgee, MD   1 year ago Foot pain, right   [place] Pickard, Cammie Mcgee, MD   1 year ago Diabetes mellitus without complication ([place])   [place] Pickard, Cammie Mcgee, MD   2 years ago Pure hypercholesterolemia   [place] Pickard, Cammie Mcgee, MD              Passed - Cr in normal range and within 180 days    Creat  Date Value Ref Range Status  01/01/2022 0.96 0.60 - 1.00 mg/dL Final   Creatinine, Ser  Date Value Ref Range Status  01/01/2022 0.90 0.44 - 1.00 mg/dL Final   Creatinine, Urine  Date Value Ref Range Status  05/13/2020 33 20 - 275 mg/dL Final         Passed - K in normal range and within 180 days    Potassium  Date Value Ref Range Status  01/01/2022 4.1 3.5 - 5.1 mmol/L Final         Passed - Patient is not pregnant

## 2022-03-12 NOTE — Telephone Encounter (Signed)
Requested medications are due for refill today.  Unsure  Requested medications are on the active medications list.  yes  Last refill. 08/05/2021 30g 3 rf  Future visit scheduled.   no  Notes to clinic.  Refill not delegated.    Requested Prescriptions  Pending Prescriptions Disp Refills   triamcinolone cream (KENALOG) 0.1 % [Pharmacy Med Name: TRIAMCINOLONE ACETONIDE 0.1 % Cream] 30 g 11    Sig: APPLY TOPICALLY 2 (TWO) TIMES DAILY.     Not Delegated - Dermatology:  Corticosteroids Failed - 03/12/2022 10:09 AM      Failed - This refill cannot be delegated      Failed - Valid encounter within last 12 months    Recent Outpatient Visits           1 year ago OAB (overactive bladder)   [place] Susy Frizzle, MD   1 year ago Diabetes mellitus without complication ([place])   [place] Pickard, Cammie Mcgee, MD   1 year ago Foot pain, right   [place] Denn[place] Schaumann, Cammie Mcgee, MD   1 year ago Diabetes mellitus without complication ([place])   [place] Pickard, Cammie Mcgee, MD   2 years ago Pure hypercholesterolemia   [place] Pickard, Cammie Mcgee, MD              Refused Prescriptions Disp Refills   losartan (COZAAR) 25 MG tablet [Pharmacy Med Name: LOSARTAN POTASSIUM 25 MG Tablet] 90 tablet 3    Sig: TAKE 1 TABLET EVERY DAY (DISCONTINUE LOSARTAN 50 MG)     Cardiovascular:  Angiotensin Receptor Blockers Failed - 03/12/2022 10:09 AM      Failed - Last BP in normal range    BP Readings from Last 1 Encounters:  01/01/22 (!) 105/91         Failed - Valid encounter within last 6 months    Recent Outpatient Visits           1 year ago OAB (overactive bladder)   [place] Susy Frizzle, MD   1 year ago Diabetes mellitus without complication ([place])   [place] Pickard, Cammie Mcgee, MD   1 year ago Foot pain, right   [place] Pickard,  Cammie Mcgee, MD   1 year ago Diabetes mellitus without complication ([place])   [place] Pickard, Cammie Mcgee, MD   2 years ago Pure hypercholesterolemia   [place] Pickard, Cammie Mcgee, MD              Passed - Cr in normal range and within 180 days    Creat  Date Value Ref Range Status  01/01/2022 0.96 0.60 - 1.00 mg/dL Final   Creatinine, Ser  Date Value Ref Range Status  01/01/2022 0.90 0.44 - 1.00 mg/dL Final   Creatinine, Urine  Date Value Ref Range Status  05/13/2020 33 20 - 275 mg/dL Final         Passed - K in normal range and within 180 days    Potassium  Date Value Ref Range Status  01/01/2022 4.1 3.5 - 5.1 mmol/L Final         Passed - Patient is not pregnant

## 2022-03-13 ENCOUNTER — Other Ambulatory Visit: Payer: Self-pay | Admitting: Family Medicine

## 2022-03-13 NOTE — Telephone Encounter (Signed)
Last RF 08/05/21 #90 2 RF should have enough to last until April 2024  Requested Prescriptions  Refused Prescriptions Disp Refills   losartan (COZAAR) 25 MG tablet [Pharmacy Med Name: LOSARTAN POTASSIUM 25 MG Tablet] 90 tablet 3    Sig: TAKE 1 TABLET EVERY DAY     Cardiovascular:  Angiotensin Receptor Blockers Failed - 03/13/2022  2:29 PM      Failed - Last BP in normal range    BP Readings from Last 1 Encounters:  01/01/22 (!) 105/91         Failed - Valid encounter within last 6 months    Recent Outpatient Visits           1 year ago OAB (overactive bladder)   [place] Susy Frizzle, MD   1 year ago Diabetes mellitus without complication ([place])   [place] Pickard, Cammie Mcgee, MD   1 year ago Foot pain, right   [place] Pickard, Cammie Mcgee, MD   1 year ago Diabetes mellitus without complication ([place])   [place] Pickard, Cammie Mcgee, MD   2 years ago Pure hypercholesterolemia   [place] Pickard, Cammie Mcgee, MD              Passed - Cr in normal range and within 180 days    Creat  Date Value Ref Range Status  01/01/2022 0.96 0.60 - 1.00 mg/dL Final   Creatinine, Ser  Date Value Ref Range Status  01/01/2022 0.90 0.44 - 1.00 mg/dL Final   Creatinine, Urine  Date Value Ref Range Status  05/13/2020 33 20 - 275 mg/dL Final         Passed - K in normal range and within 180 days    Potassium  Date Value Ref Range Status  01/01/2022 4.1 3.5 - 5.1 mmol/L Final         Passed - Patient is not pregnant

## 2022-03-17 ENCOUNTER — Telehealth: Payer: Self-pay | Admitting: Family Medicine

## 2022-03-17 ENCOUNTER — Other Ambulatory Visit: Payer: Self-pay | Admitting: Family Medicine

## 2022-03-17 MED ORDER — TRIAMCINOLONE ACETONIDE 0.1 % EX CREA: TOPICAL_CREAM | Freq: Two times a day (BID) | CUTANEOUS | 3 refills | Status: DC

## 2022-03-17 NOTE — Telephone Encounter (Signed)
Prescription Request  03/17/2022  Is this a "Controlled Substance" medicine? No  LOV: 09/29/2021  What is the name of the medication or equipment?   triamcinolone cream (KENALOG) 0.1 %   Have you contacted your pharmacy to request a refill? Yes   Which pharmacy would you like this sent to?  [place], [place] [facility] [place] [address] Phone: [phone] Fax: [phone]    Patient notified that their request is being sent to the clinical staff for review and that they should receive a response within 2 business days.   Please advise pharmacist.

## 2022-03-17 NOTE — Telephone Encounter (Signed)
2nd request. See 03/12/22 refill encounter sent to provider for approval.

## 2022-03-19 ENCOUNTER — Other Ambulatory Visit: Payer: Self-pay | Admitting: Family Medicine

## 2022-03-20 ENCOUNTER — Other Ambulatory Visit: Payer: Self-pay

## 2022-03-20 DIAGNOSIS — E119 Type 2 diabetes mellitus without complications: Secondary | ICD-10-CM

## 2022-03-20 MED ORDER — LOSARTAN POTASSIUM 25 MG PO TABS: ORAL_TABLET | ORAL | 2 refills | Status: DC

## 2022-05-06 ENCOUNTER — Other Ambulatory Visit: Payer: Self-pay | Admitting: Family Medicine

## 2022-05-06 DIAGNOSIS — N3281 Overactive bladder: Secondary | ICD-10-CM

## 2022-06-08 ENCOUNTER — Other Ambulatory Visit: Payer: Self-pay | Admitting: Family Medicine

## 2022-06-08 DIAGNOSIS — J019 Acute sinusitis, unspecified: Secondary | ICD-10-CM

## 2022-06-09 NOTE — Telephone Encounter (Signed)
Requested Prescriptions  Pending Prescriptions Disp Refills   fluticasone (FLONASE) 50 MCG/ACT nasal spray [Pharmacy Med Name: FLUTICASONE PROPIONATE 50 MCG/ACT Suspension] 48 g 3    Sig: USE 2 SPRAYS IN EACH NOSTRIL EVERY DAY     Ear, Nose, and Throat: Nasal Preparations - Corticosteroids Failed - 06/08/2022 10:21 AM      Failed - Valid encounter within last 12 months    Recent Outpatient Visits           1 year ago OAB (overactive bladder)   [facility] Donita Brooks, MD   1 year ago Diabetes mellitus without complication [facility]   [facility] Pickard, Priscille Heidelberg, MD   2 years ago Foot pain, right   [facility] Donita Brooks, MD   2 years ago Diabetes mellitus without complication [facility])   [facility] Pickard, Priscille Heidelberg, MD   2 years ago Pure hypercholesterolemia   [facility] Pickard, Priscille Heidelberg, MD

## 2022-06-12 ENCOUNTER — Encounter: Payer: Self-pay | Admitting: Emergency Medicine

## 2022-07-06 ENCOUNTER — Other Ambulatory Visit: Payer: Self-pay | Admitting: Family Medicine

## 2022-07-06 DIAGNOSIS — E118 Type 2 diabetes mellitus with unspecified complications: Secondary | ICD-10-CM

## 2022-07-19 ENCOUNTER — Other Ambulatory Visit: Payer: Self-pay | Admitting: Family Medicine

## 2022-07-19 DIAGNOSIS — E119 Type 2 diabetes mellitus without complications: Secondary | ICD-10-CM

## 2022-07-21 ENCOUNTER — Other Ambulatory Visit: Payer: Self-pay | Admitting: Family Medicine

## 2022-07-21 DIAGNOSIS — E119 Type 2 diabetes mellitus without complications: Secondary | ICD-10-CM

## 2022-07-22 NOTE — Telephone Encounter (Signed)
Refilled 07/20/22. Requested Prescriptions  Refused Prescriptions Disp Refills   metFORMIN (GLUCOPHAGE) 1000 MG tablet [Pharmacy Med Name: METFORMIN HYDROCHLORIDE 1000 MG Tablet] 180 tablet 3    Sig: TAKE 1 TABLET TWICE DAILY WITH MEALS     Endocrinology:  Diabetes - Biguanides Failed - 07/21/2022  1:58 PM      Failed - HBA1C is between 0 and 7.9 and within 180 days    Hgb A1c MFr Bld  Date Value Ref Range Status  08/04/2021 6.0 (H) <5.7 % of total Hgb Final    Comment:    For someone without known diabetes, a hemoglobin  A1c value between 5.7% and 6.4% is consistent with prediabetes and should be confirmed with a  follow-up test. . For someone with known diabetes, a value <7% indicates that their diabetes is well controlled. A1c targets should be individualized based on duration of diabetes, age, comorbid conditions, and other considerations. . This assay result is consistent with an increased risk of diabetes. . Currently, no consensus exists regarding use of hemoglobin A1c for diagnosis of diabetes for children. .          Failed - B12 Level in normal range and within 720 days    No results found for: "VITAMINB12"       Failed - Valid encounter within last 6 months    Recent Outpatient Visits           1 year ago OAB (overactive bladder)   [facility] Donita Brooks, MD   1 year ago Diabetes mellitus without complication (HCC)   [facility] Pickard, Priscille Heidelberg, MD   2 years ago Foot pain, right   [facility] Donita Brooks, MD   2 years ago Diabetes mellitus without complication (HCC)   [facility] Pickard, Priscille Heidelberg, MD   2 years ago Pure hypercholesterolemia   [facility] Pickard, Priscille Heidelberg, MD       Future Appointments             In 6 days Pickard, Priscille Heidelberg, MD [facility]   In 1 month Tanya Nones, Priscille Heidelberg, MD [facility]            Passed - Cr in normal range and within 360 days    Creat  Date Value Ref Range Status  01/01/2022 0.96 0.60 - 1.00 mg/dL Final   Creatinine, Ser  Date Value Ref Range Status  01/01/2022 0.90 0.44 - 1.00 mg/dL Final   Creatinine, Urine  Date Value Ref Range Status  05/13/2020 33 20 - 275 mg/dL Final         Passed - eGFR in normal range and within 360 days    GFR, Est African American  Date Value Ref Range Status  05/13/2020 64 > OR = 60 mL/min/1.62m2 Final   GFR, Est Non African American  Date Value Ref Range Status  05/13/2020 55 (L) > OR = 60 mL/min/1.24m2 Final   GFR, Estimated  Date Value Ref Range Status  01/01/2022 >60 >60 mL/min Final    Comment:    (NOTE) Calculated using the CKD-EPI Creatinine Equation (2021)    GFR  Date Value Ref Range Status  04/27/2011 83.88 >60.00 mL/min Final   eGFR  Date Value Ref Range Status  01/01/2022 64 > OR = 60 mL/min/1.81m2 Final         Passed -  CBC within normal limits and completed in the last 12 months    WBC  Date Value Ref Range Status  01/01/2022 11.5 (H) 4.0 - 10.5 K/uL Final   RBC  Date Value Ref Range Status  01/01/2022 4.24 3.87 - 5.11 MIL/uL Final   Hemoglobin  Date Value Ref Range Status  01/01/2022 12.4 12.0 - 15.0 g/dL Final   HCT  Date Value Ref Range Status  01/01/2022 37.0 36.0 - 46.0 % Final   MCHC  Date Value Ref Range Status  01/01/2022 33.5 30.0 - 36.0 g/dL Final   [facility]  Date Value Ref Range Status  01/01/2022 29.2 26.0 - 34.0 pg Final   MCV  Date Value Ref Range Status  01/01/2022 87.3 80.0 - 100.0 fL Final   No results found for: "PLTCOUNTKUC", "LABPLAT", "POCPLA" RDW  Date Value Ref Range Status  01/01/2022 14.9 11.5 - 15.5 % Final

## 2022-07-28 ENCOUNTER — Ambulatory Visit: Payer: Medicare PPO | Admitting: Family Medicine

## 2022-08-04 ENCOUNTER — Ambulatory Visit (INDEPENDENT_AMBULATORY_CARE_PROVIDER_SITE_OTHER): Payer: Medicare PPO | Admitting: Family Medicine

## 2022-08-04 ENCOUNTER — Encounter: Payer: Self-pay | Admitting: Family Medicine

## 2022-08-04 VITALS — BP 124/72 | HR 59 | Temp 98.6°F | Ht 64.0 in | Wt 144.2 lb

## 2022-08-04 DIAGNOSIS — E119 Type 2 diabetes mellitus without complications: Secondary | ICD-10-CM

## 2022-08-04 MED ORDER — FLUOXETINE HCL 20 MG PO TABS: 20.0000 mg | ORAL_TABLET | Freq: Every day | ORAL | 3 refills | Status: DC

## 2022-08-04 MED ORDER — HYDROCODONE-ACETAMINOPHEN 5-325 MG PO TABS: 1.0000 | ORAL_TABLET | Freq: Four times a day (QID) | ORAL | 0 refills | Status: DC | PRN

## 2022-08-04 NOTE — Progress Notes (Signed)
Subjective:    Patient ID: Rhonda Fields, female    DOB: [birth year], 72 y.o.   MRN: [id number]  Patient is here today for follow-up.  Her most recent lab work was outstanding.  Her hemoglobin A1c was well controlled.  Her renal function was excellent.  Her LDL cholesterol was well below 100. Patient is here today for a follow up on diabetes.  Her blood pressure is good.  However she is not checking her blood sugar.  She denies any polyuria or polydipsia or blurry vision.  She does occasionally have urinary urge incontinence related to overactive bladder.  She denies any neuropathy in her feet.  She has been getting more cramps in her legs recently but diabetic foot exam was normal and showed normal pulses in both feet.  She does report worsening anxiety and depression.  She states that she has been losing her temper more easily.  She states that she is moody.  She feels down and depressed most days.  She is losing her temper more easily.  She denies any suicidal thoughts.  Previously she was taking Prozac and would like to try that again.  She does complain of right low back pain.  She is using ibuprofen however she would like me to refill the Norco that she uses sparingly.  30 tablets last for more than a year.  She also reports loose stools every time she eats Past Medical History:  Diagnosis Date   Anxiety    Arrhythmia    Chronic headaches    Depression    Diabetes mellitus without complication (HCC)    GERD (gastroesophageal reflux disease)    Hard of hearing    IBS (irritable bowel syndrome) 1986   Past Surgical History:  Procedure Laterality Date   ABDOMINAL HYSTERECTOMY     CHOLECYSTECTOMY     ERCP  04/20/2011   Procedure: ENDOSCOPIC RETROGRADE CHOLANGIOPANCREATOGRAPHY (ERCP);  Surgeon: Louis Meckel, MD;  Location: [facility];  Service: Endoscopy;  Laterality: N/A;  case is at 1430 in or   ERCP  04/20/2011   Procedure: ENDOSCOPIC RETROGRADE CHOLANGIOPANCREATOGRAPHY  (ERCP);  Surgeon: Louis Meckel, MD;  Location: [facility];  Service: Gastroenterology;  Laterality: N/A;   Current Outpatient Medications on File Prior to Visit  Medication Sig Dispense Refill   Alcohol Swabs (DROPSAFE ALCOHOL PREP) 70 % PADS USE AS DIRECTED PRIOR TO MONITORING BLOOD GLUCOSE UP TO THREE TIMES DAILY 300 each 3   alendronate (FOSAMAX) 70 MG tablet TAKE 1 TABLET EVERY 7 DAYS WITH A FULL GLASS OF WATER ON AN EMPTY STOMACH 12 tablet 3   Ascorbic Acid (VITAMIN C) 1000 MG tablet Take 1,000 mg by mouth daily.     aspirin 81 MG chewable tablet Chew by mouth daily.     atorvastatin (LIPITOR) 40 MG tablet TAKE 1 TABLET EVERY DAY 90 tablet 10   azithromycin (ZITHROMAX) 250 MG tablet 2 tabs poqday1, 1 tab poqday 2-5 6 tablet 0   BIOTIN 5000 PO Take by mouth.     Blood Glucose Calibration (TRUE METRIX LEVEL 1) Low SOLN Use as directed to monitor FSBS 1x daily. Dx: E11.9 100 each 3   Blood Glucose Monitoring Suppl (TRUE METRIX METER) w/Device KIT USE AS DIRECTED 1 kit 3   Calcium Carbonate-Vit D-Min (CALCIUM 1200) 1200-1000 MG-UNIT CHEW Chew by mouth.     Cinnamon 500 MG TABS Take by mouth.     fluticasone (FLONASE) 50 MCG/ACT nasal spray USE 2 SPRAYS IN EACH NOSTRIL  EVERY DAY 48 g 1   glucose blood test strip Use as instructed 100 each 3   losartan (COZAAR) 25 MG tablet TAKE 1 TABLET EVERY DAY 90 tablet 2   metFORMIN (GLUCOPHAGE) 1000 MG tablet Courtesy refill. Pt need appt w/pcp for future refills.  TAKE 1 TABLET TWICE DAILY WITH MEALS 60 tablet 0   Multiple Vitamin (MULTIVITAMIN WITH MINERALS) TABS tablet Take 1 tablet by mouth daily.     Omega-3 Fatty Acids (FISH OIL) 1200 MG CPDR Take by mouth 2 (two) times daily.     oxybutynin (DITROPAN) 5 MG tablet TAKE 1 TABLET FOUR TIMES DAILY 360 tablet 3   pantoprazole (PROTONIX) 40 MG tablet TAKE 1 TABLET TWICE DAILY 60 tablet 1   pioglitazone (ACTOS) 30 MG tablet TAKE 1 TABLET EVERY DAY 30 tablet 1   triamcinolone cream (KENALOG) 0.1 % Apply  topically 2 (two) times daily. 30 g 3   TRUEplus Lancets 33G MISC Use as directed to monitor FSBS 1x daily. Dx: E11.9 100 each 3   zinc gluconate 50 MG tablet Take 50 mg by mouth daily.     No current facility-administered medications on file prior to visit.   Allergies  Allergen Reactions   Ivp Dye [Iodinated Contrast Media] Itching and Nausea And Vomiting   Penicillins Hives and Itching   Social History   Socioeconomic History   Marital status: Widowed    Spouse name: Not on file   Number of children: 4   Years of education: Not on file   Highest education level: Not on file  Occupational History   Occupation: Unemployed  Tobacco Use   Smoking status: Every Day    Current packs/day: 1.00    Average packs/day: 1 pack/day for 40.0 years (40.0 ttl pk-yrs)    Types: Cigarettes   Smokeless tobacco: Never  Substance and Sexual Activity   Alcohol use: No   Drug use: No   Sexual activity: Not on file  Other Topics Concern   Not on file  Social History Narrative   Daughter lives next door.   Widowed. Was married x 37 years.   Social Determinants of Health   Financial Resource Strain: Low Risk  (03/06/2022)   Overall Financial Resource Strain (CARDIA)    Difficulty of Paying Living Expenses: Not very hard  Food Insecurity: No Food Insecurity (03/06/2022)   Hunger Vital Sign    Worried About Running Out of Food in the Last Year: Never true    Ran Out of Food in the Last Year: Never true  Transportation Needs: No Transportation Needs (03/06/2022)   PRAPARE - Administrator, Civil Service (Medical): No    Lack of Transportation (Non-Medical): No  Physical Activity: Insufficiently Active (03/06/2022)   Exercise Vital Sign    Days of Exercise per Week: 2 days    Minutes of Exercise per Session: 10 min  Stress: No Stress Concern Present (03/06/2022)   [employer] of Occupational Health - Occupational Stress Questionnaire    Feeling of Stress : Only a little   Social Connections: Socially Isolated (03/06/2022)   Social Connection and Isolation Panel [NHANES]    Frequency of Communication with Friends and Family: More than three times a week    Frequency of Social Gatherings with Friends and Family: More than three times a week    Attends Religious Services: Never    Database administrator or Organizations: No    Attends Banker Meetings: Never  Marital Status: Widowed  Intimate Partner Violence: Not At Risk (03/06/2022)   Humiliation, Afraid, Rape, and Kick questionnaire    Fear of Current or Ex-Partner: No    Emotionally Abused: No    Physically Abused: No    Sexually Abused: No     Review of Systems  All other systems reviewed and are negative.      Objective:   Physical Exam Vitals reviewed.  Constitutional:      Appearance: Normal appearance. She is normal weight.  Cardiovascular:     Rate and Rhythm: Normal rate and regular rhythm.     Heart sounds: Normal heart sounds.  Pulmonary:     Effort: Pulmonary effort is normal. No respiratory distress.     Breath sounds: Normal breath sounds. No wheezing or rales.  Chest:     Chest wall: No tenderness.  Abdominal:     General: Bowel sounds are normal. There is no distension.     Palpations: Abdomen is soft.     Tenderness: There is no abdominal tenderness. There is no guarding or rebound.     Hernia: A hernia is present. Hernia is present in the ventral area.  Neurological:     Mental Status: She is alert.           Assessment & Plan:   Diabetes mellitus without complication (HCC) - Plan: [facility] Diabetes Foot Exam, Hemoglobin A1c, Hemoglobin A1c, COMPLETE METABOLIC PANEL WITH GFR, Lipid panel, Protein / Creatinine Ratio, Urine Pressure today is excellent.  I will check an A1c.  I will likely replace metformin with an alternative to reduce the frequency of her loose stools.  If the loose stool continue I will consider GI consult for colonoscopy.  Check fasting  lipid panel.  I like to see her LDL cholesterol less than 161.  I will refill her Norco that she uses sparingly for back pain.  I will also resume her Prozac 20 mg a day for depression and mood swings.  Recheck in 4 weeks

## 2022-08-05 LAB — PROTEIN / CREATININE RATIO, URINE
Creatinine, Urine: 41 mg/dL (ref 20–275)
Protein/Creat Ratio: 122 mg/g creat (ref 24–184)
Protein/Creatinine Ratio: 0.122 mg/mg creat (ref 0.024–0.184)
Total Protein, Urine: 5 mg/dL (ref 5–24)

## 2022-08-05 LAB — HEMOGLOBIN A1C
Hgb A1c MFr Bld: 6.5 % of total Hgb — ABNORMAL HIGH (ref <5.7)
Mean Plasma Glucose: 140 mg/dL
eAG (mmol/L): 7.7 mmol/L

## 2022-08-05 LAB — LIPID PANEL
Cholesterol: 100 mg/dL (ref <200)
HDL: 42 mg/dL — ABNORMAL LOW (ref >=50)
LDL Cholesterol (Calc): 32 mg/dL (calc)
Non-HDL Cholesterol (Calc): 58 mg/dL (calc) (ref <130)
Total CHOL/HDL Ratio: 2.4 (calc) (ref <5.0)
Triglycerides: 179 mg/dL — ABNORMAL HIGH (ref <150)

## 2022-08-05 LAB — COMPLETE METABOLIC PANEL WITH GFR
AG Ratio: 1.4 (calc) (ref 1.0–2.5)
ALT: 11 U/L (ref 6–29)
AST: 17 U/L (ref 10–35)
Albumin: 4.2 g/dL (ref 3.6–5.1)
Alkaline phosphatase (APISO): 69 U/L (ref 37–153)
BUN/Creatinine Ratio: 16 (calc) (ref 6–22)
BUN: 17 mg/dL (ref 7–25)
CO2: 20 mmol/L (ref 20–32)
Calcium: 10 mg/dL (ref 8.6–10.4)
Chloride: 105 mmol/L (ref 98–110)
Creat: 1.07 mg/dL — ABNORMAL HIGH (ref 0.60–1.00)
Globulin: 3 g/dL (calc) (ref 1.9–3.7)
Glucose, Bld: 104 mg/dL — ABNORMAL HIGH (ref 65–99)
Potassium: 4.4 mmol/L (ref 3.5–5.3)
Sodium: 137 mmol/L (ref 135–146)
Total Bilirubin: 0.3 mg/dL (ref 0.2–1.2)
Total Protein: 7.2 g/dL (ref 6.1–8.1)
eGFR: 56 mL/min/{1.73_m2} — ABNORMAL LOW (ref >=60)

## 2022-08-06 ENCOUNTER — Other Ambulatory Visit: Payer: Self-pay | Admitting: Family Medicine

## 2022-08-07 ENCOUNTER — Encounter: Payer: Self-pay | Admitting: Family Medicine

## 2022-08-13 ENCOUNTER — Telehealth: Payer: Self-pay | Admitting: Family Medicine

## 2022-08-13 ENCOUNTER — Other Ambulatory Visit: Payer: Self-pay | Admitting: Family Medicine

## 2022-08-13 MED ORDER — FLUOXETINE HCL 20 MG PO CAPS: 20.0000 mg | ORAL_CAPSULE | Freq: Every day | ORAL | 3 refills | Status: DC

## 2022-08-13 NOTE — Telephone Encounter (Signed)
[facility] sent an efax regarding refill request they received for FLUoxetine (PROZAC) 20 MG capsule   Message from pharmacist is as follows:   Please advise pharmacist at

## 2022-08-24 ENCOUNTER — Encounter: Payer: Medicare PPO | Admitting: Family Medicine

## 2022-08-28 ENCOUNTER — Other Ambulatory Visit: Payer: Self-pay | Admitting: Family Medicine

## 2022-08-28 DIAGNOSIS — E118 Type 2 diabetes mellitus with unspecified complications: Secondary | ICD-10-CM

## 2022-08-28 NOTE — Telephone Encounter (Signed)
Requested Prescriptions  Pending Prescriptions Disp Refills   pioglitazone (ACTOS) 30 MG tablet [Pharmacy Med Name: PIOGLITAZONE HYDROCHLORIDE 30 MG Tablet] 60 tablet 5    Sig: TAKE 1 TABLET EVERY DAY     Endocrinology:  Diabetes - Glitazones - pioglitazone Failed - 08/28/2022  2:20 AM      Failed - Valid encounter within last 6 months    Recent Outpatient Visits           1 year ago OAB (overactive bladder)   [facility] Donita Brooks, MD   2 years ago Diabetes mellitus without complication (HCC)   [facility] Donita Brooks, MD   2 years ago Foot pain, right   [facility] Pickard, Priscille Heidelberg, MD   2 years ago Diabetes mellitus without complication (HCC)   [facility] Pickard, Priscille Heidelberg, MD   2 years ago Pure hypercholesterolemia   [facility] Pickard, Priscille Heidelberg, MD       Future Appointments             In 2 weeks Pickard, Priscille Heidelberg, MD [facility]            Passed - HBA1C is between 0 and 7.9 and within 180 days    Hgb A1c MFr Bld  Date Value Ref Range Status  08/04/2022 6.5 (H) <5.7 % of total Hgb Final    Comment:    For someone without known diabetes, a hemoglobin A1c value of 6.5% or greater indicates that they may have  diabetes and this should be confirmed with a follow-up  test. . For someone with known diabetes, a value <7% indicates  that their diabetes is well controlled and a value  greater than or equal to 7% indicates suboptimal  control. A1c targets should be individualized based on  duration of diabetes, age, comorbid conditions, and  other considerations. . Currently, no consensus exists regarding use of hemoglobin A1c for diagnosis of diabetes for children. .           pantoprazole (PROTONIX) 40 MG tablet [Pharmacy Med Name: PANTOPRAZOLE SODIUM 40 MG Tablet Delayed Release] 120 tablet 5    Sig: TAKE 1 TABLET TWICE DAILY      Gastroenterology: Proton Pump Inhibitors Failed - 08/28/2022  2:20 AM      Failed - Valid encounter within last 12 months    Recent Outpatient Visits           1 year ago OAB (overactive bladder)   [facility] Donita Brooks, MD   2 years ago Diabetes mellitus without complication [facility])   [facility] Pickard, Priscille Heidelberg, MD   2 years ago Foot pain, right   [facility] Donita Brooks, MD   2 years ago Diabetes mellitus without complication [facility])   [facility] Pickard, Priscille Heidelberg, MD   2 years ago Pure hypercholesterolemia   [facility] Pickard, Priscille Heidelberg, MD       Future Appointments             In 2 weeks Pickard, Priscille Heidelberg, MD [facility], [facility]

## 2022-09-08 ENCOUNTER — Other Ambulatory Visit: Payer: Medicare PPO

## 2022-09-11 ENCOUNTER — Encounter: Payer: Medicare PPO | Admitting: Family Medicine

## 2022-09-19 DIAGNOSIS — S72001A Fracture of unspecified part of neck of right femur, initial encounter for closed fracture: Secondary | ICD-10-CM | POA: Diagnosis not present

## 2022-09-19 DIAGNOSIS — M1611 Unilateral primary osteoarthritis, right hip: Secondary | ICD-10-CM | POA: Diagnosis not present

## 2022-09-22 ENCOUNTER — Emergency Department (HOSPITAL_COMMUNITY): Payer: Medicare PPO

## 2022-09-22 ENCOUNTER — Ambulatory Visit: Payer: Self-pay | Admitting: Orthopaedic Surgery

## 2022-09-22 ENCOUNTER — Encounter (HOSPITAL_COMMUNITY): Payer: Self-pay

## 2022-09-22 ENCOUNTER — Inpatient Hospital Stay (HOSPITAL_COMMUNITY)
Admission: EM | Admit: 2022-09-22 | Discharge: 2022-09-28 | DRG: 481 | Disposition: A | Discharge status: Skilled Nursing Facility | Payer: Medicare PPO | Attending: Internal Medicine | Admitting: Internal Medicine

## 2022-09-22 ENCOUNTER — Other Ambulatory Visit: Payer: Self-pay

## 2022-09-22 DIAGNOSIS — M4854XA Collapsed vertebra, not elsewhere classified, thoracic region, initial encounter for fracture: Secondary | ICD-10-CM | POA: Diagnosis present

## 2022-09-22 DIAGNOSIS — F32A Depression, unspecified: Secondary | ICD-10-CM | POA: Diagnosis present

## 2022-09-22 DIAGNOSIS — Z841 Family history of disorders of kidney and ureter: Secondary | ICD-10-CM

## 2022-09-22 DIAGNOSIS — R079 Chest pain, unspecified: Secondary | ICD-10-CM | POA: Diagnosis not present

## 2022-09-22 DIAGNOSIS — S72091A Other fracture of head and neck of right femur, initial encounter for closed fracture: Principal | ICD-10-CM

## 2022-09-22 DIAGNOSIS — Z833 Family history of diabetes mellitus: Secondary | ICD-10-CM

## 2022-09-22 DIAGNOSIS — N179 Acute kidney failure, unspecified: Secondary | ICD-10-CM | POA: Insufficient documentation

## 2022-09-22 DIAGNOSIS — S728X1A Other fracture of right femur, initial encounter for closed fracture: Secondary | ICD-10-CM | POA: Diagnosis not present

## 2022-09-22 DIAGNOSIS — S92351A Displaced fracture of fifth metatarsal bone, right foot, initial encounter for closed fracture: Secondary | ICD-10-CM | POA: Diagnosis not present

## 2022-09-22 DIAGNOSIS — Z62820 Parent-biological child conflict: Secondary | ICD-10-CM | POA: Diagnosis not present

## 2022-09-22 DIAGNOSIS — F419 Anxiety disorder, unspecified: Secondary | ICD-10-CM | POA: Insufficient documentation

## 2022-09-22 DIAGNOSIS — I10 Essential (primary) hypertension: Secondary | ICD-10-CM | POA: Diagnosis present

## 2022-09-22 DIAGNOSIS — Z638 Other specified problems related to primary support group: Secondary | ICD-10-CM | POA: Diagnosis not present

## 2022-09-22 DIAGNOSIS — R29898 Other symptoms and signs involving the musculoskeletal system: Secondary | ICD-10-CM | POA: Diagnosis not present

## 2022-09-22 DIAGNOSIS — M96661 Fracture of femur following insertion of orthopedic implant, joint prosthesis, or bone plate, right leg: Secondary | ICD-10-CM | POA: Diagnosis not present

## 2022-09-22 DIAGNOSIS — M7731 Calcaneal spur, right foot: Secondary | ICD-10-CM | POA: Diagnosis not present

## 2022-09-22 DIAGNOSIS — Z7982 Long term (current) use of aspirin: Secondary | ICD-10-CM

## 2022-09-22 DIAGNOSIS — E860 Dehydration: Secondary | ICD-10-CM | POA: Diagnosis present

## 2022-09-22 DIAGNOSIS — E663 Overweight: Secondary | ICD-10-CM | POA: Diagnosis present

## 2022-09-22 DIAGNOSIS — F17210 Nicotine dependence, cigarettes, uncomplicated: Secondary | ICD-10-CM | POA: Diagnosis present

## 2022-09-22 DIAGNOSIS — E871 Hypo-osmolality and hyponatremia: Secondary | ICD-10-CM | POA: Diagnosis present

## 2022-09-22 DIAGNOSIS — S72009A Fracture of unspecified part of neck of unspecified femur, initial encounter for closed fracture: Secondary | ICD-10-CM | POA: Diagnosis present

## 2022-09-22 DIAGNOSIS — S72141A Displaced intertrochanteric fracture of right femur, initial encounter for closed fracture: Secondary | ICD-10-CM | POA: Diagnosis not present

## 2022-09-22 DIAGNOSIS — S4292XA Fracture of left shoulder girdle, part unspecified, initial encounter for closed fracture: Secondary | ICD-10-CM | POA: Diagnosis present

## 2022-09-22 DIAGNOSIS — W19XXXA Unspecified fall, initial encounter: Secondary | ICD-10-CM | POA: Diagnosis present

## 2022-09-22 DIAGNOSIS — K219 Gastro-esophageal reflux disease without esophagitis: Secondary | ICD-10-CM | POA: Diagnosis present

## 2022-09-22 DIAGNOSIS — Z609 Problem related to social environment, unspecified: Secondary | ICD-10-CM | POA: Diagnosis present

## 2022-09-22 DIAGNOSIS — Z880 Allergy status to penicillin: Secondary | ICD-10-CM

## 2022-09-22 DIAGNOSIS — Z7983 Long term (current) use of bisphosphonates: Secondary | ICD-10-CM

## 2022-09-22 DIAGNOSIS — Z90710 Acquired absence of both cervix and uterus: Secondary | ICD-10-CM

## 2022-09-22 DIAGNOSIS — E8721 Acute metabolic acidosis: Secondary | ICD-10-CM | POA: Diagnosis present

## 2022-09-22 DIAGNOSIS — Z79899 Other long term (current) drug therapy: Secondary | ICD-10-CM

## 2022-09-22 DIAGNOSIS — R296 Repeated falls: Secondary | ICD-10-CM | POA: Diagnosis present

## 2022-09-22 DIAGNOSIS — S72341A Displaced spiral fracture of shaft of right femur, initial encounter for closed fracture: Secondary | ICD-10-CM | POA: Insufficient documentation

## 2022-09-22 DIAGNOSIS — E876 Hypokalemia: Secondary | ICD-10-CM | POA: Diagnosis not present

## 2022-09-22 DIAGNOSIS — Z6826 Body mass index (BMI) 26.0-26.9, adult: Secondary | ICD-10-CM

## 2022-09-22 DIAGNOSIS — E119 Type 2 diabetes mellitus without complications: Secondary | ICD-10-CM | POA: Diagnosis present

## 2022-09-22 DIAGNOSIS — D72829 Elevated white blood cell count, unspecified: Secondary | ICD-10-CM | POA: Insufficient documentation

## 2022-09-22 DIAGNOSIS — S72101A Unspecified trochanteric fracture of right femur, initial encounter for closed fracture: Secondary | ICD-10-CM | POA: Diagnosis not present

## 2022-09-22 DIAGNOSIS — S92353D Displaced fracture of fifth metatarsal bone, unspecified foot, subsequent encounter for fracture with routine healing: Secondary | ICD-10-CM

## 2022-09-22 DIAGNOSIS — R0781 Pleurodynia: Secondary | ICD-10-CM | POA: Diagnosis not present

## 2022-09-22 DIAGNOSIS — S72341D Displaced spiral fracture of shaft of right femur, subsequent encounter for closed fracture with routine healing: Secondary | ICD-10-CM | POA: Diagnosis not present

## 2022-09-22 DIAGNOSIS — Z9049 Acquired absence of other specified parts of digestive tract: Secondary | ICD-10-CM

## 2022-09-22 DIAGNOSIS — D62 Acute posthemorrhagic anemia: Secondary | ICD-10-CM | POA: Diagnosis not present

## 2022-09-22 DIAGNOSIS — S199XXA Unspecified injury of neck, initial encounter: Secondary | ICD-10-CM | POA: Diagnosis not present

## 2022-09-22 DIAGNOSIS — M25512 Pain in left shoulder: Secondary | ICD-10-CM | POA: Diagnosis not present

## 2022-09-22 DIAGNOSIS — S72141D Displaced intertrochanteric fracture of right femur, subsequent encounter for closed fracture with routine healing: Secondary | ICD-10-CM | POA: Diagnosis not present

## 2022-09-22 DIAGNOSIS — Z7401 Bed confinement status: Secondary | ICD-10-CM | POA: Diagnosis not present

## 2022-09-22 DIAGNOSIS — S0990XA Unspecified injury of head, initial encounter: Secondary | ICD-10-CM | POA: Diagnosis not present

## 2022-09-22 DIAGNOSIS — R0989 Other specified symptoms and signs involving the circulatory and respiratory systems: Secondary | ICD-10-CM | POA: Diagnosis not present

## 2022-09-22 DIAGNOSIS — Z885 Allergy status to narcotic agent status: Secondary | ICD-10-CM

## 2022-09-22 DIAGNOSIS — E785 Hyperlipidemia, unspecified: Secondary | ICD-10-CM | POA: Insufficient documentation

## 2022-09-22 DIAGNOSIS — J439 Emphysema, unspecified: Secondary | ICD-10-CM | POA: Diagnosis not present

## 2022-09-22 DIAGNOSIS — S79929A Unspecified injury of unspecified thigh, initial encounter: Secondary | ICD-10-CM | POA: Diagnosis not present

## 2022-09-22 DIAGNOSIS — K449 Diaphragmatic hernia without obstruction or gangrene: Secondary | ICD-10-CM | POA: Diagnosis not present

## 2022-09-22 DIAGNOSIS — E041 Nontoxic single thyroid nodule: Secondary | ICD-10-CM | POA: Diagnosis not present

## 2022-09-22 DIAGNOSIS — S72001A Fracture of unspecified part of neck of right femur, initial encounter for closed fracture: Secondary | ICD-10-CM | POA: Diagnosis not present

## 2022-09-22 DIAGNOSIS — Z91041 Radiographic dye allergy status: Secondary | ICD-10-CM

## 2022-09-22 DIAGNOSIS — Z7984 Long term (current) use of oral hypoglycemic drugs: Secondary | ICD-10-CM

## 2022-09-22 DIAGNOSIS — I517 Cardiomegaly: Secondary | ICD-10-CM | POA: Diagnosis not present

## 2022-09-22 DIAGNOSIS — I6782 Cerebral ischemia: Secondary | ICD-10-CM | POA: Diagnosis not present

## 2022-09-22 LAB — BASIC METABOLIC PANEL
Anion gap: 20 — ABNORMAL HIGH (ref 5–15)
BUN: 39 mg/dL — ABNORMAL HIGH (ref 8–23)
CO2: 12 mmol/L — ABNORMAL LOW (ref 22–32)
Calcium: 9 mg/dL (ref 8.9–10.3)
Chloride: 100 mmol/L (ref 98–111)
Creatinine, Ser: 1.5 mg/dL — ABNORMAL HIGH (ref 0.44–1.00)
GFR, Estimated: 37 mL/min — ABNORMAL LOW (ref >60)
Glucose, Bld: 105 mg/dL — ABNORMAL HIGH (ref 70–99)
Potassium: 4.8 mmol/L (ref 3.5–5.1)
Sodium: 132 mmol/L — ABNORMAL LOW (ref 135–145)

## 2022-09-22 LAB — CBC WITH DIFFERENTIAL/PLATELET
Abs Immature Granulocytes: 0.13 10*3/uL — ABNORMAL HIGH (ref 0.00–0.07)
Basophils Absolute: 0.1 10*3/uL (ref 0.0–0.1)
Basophils Relative: 0 %
Eosinophils Absolute: 0 10*3/uL (ref 0.0–0.5)
Eosinophils Relative: 0 %
HCT: 33.3 % — ABNORMAL LOW (ref 36.0–46.0)
Hemoglobin: 10.8 g/dL — ABNORMAL LOW (ref 12.0–15.0)
Immature Granulocytes: 1 %
Lymphocytes Relative: 4 %
Lymphs Abs: 0.7 10*3/uL (ref 0.7–4.0)
MCH: 29 pg (ref 26.0–34.0)
MCHC: 32.4 g/dL (ref 30.0–36.0)
MCV: 89.5 fL (ref 80.0–100.0)
Monocytes Absolute: 0.7 10*3/uL (ref 0.1–1.0)
Monocytes Relative: 4 %
Neutro Abs: 15.7 10*3/uL — ABNORMAL HIGH (ref 1.7–7.7)
Neutrophils Relative %: 91 %
Platelets: 346 10*3/uL (ref 150–400)
RBC: 3.72 MIL/uL — ABNORMAL LOW (ref 3.87–5.11)
RDW: 15.4 % (ref 11.5–15.5)
WBC: 17.3 10*3/uL — ABNORMAL HIGH (ref 4.0–10.5)
nRBC: 0 % (ref 0.0–0.2)

## 2022-09-22 LAB — URINALYSIS, ROUTINE W REFLEX MICROSCOPIC
Bilirubin Urine: NEGATIVE
Glucose, UA: NEGATIVE mg/dL
Hgb urine dipstick: NEGATIVE
Ketones, ur: 20 mg/dL — AB
Leukocytes,Ua: NEGATIVE
Nitrite: NEGATIVE
Protein, ur: NEGATIVE mg/dL
Specific Gravity, Urine: 1.012 (ref 1.005–1.030)
pH: 5 (ref 5.0–8.0)

## 2022-09-22 LAB — GLUCOSE, CAPILLARY
Glucose-Capillary: 101 mg/dL — ABNORMAL HIGH (ref 70–99)
Glucose-Capillary: 100 mg/dL — ABNORMAL HIGH (ref 70–99)

## 2022-09-22 LAB — CK: Total CK: 297 U/L — ABNORMAL HIGH (ref 38–234)

## 2022-09-22 LAB — PROCALCITONIN: Procalcitonin: 2.72 ng/mL

## 2022-09-22 MED ORDER — INSULIN ASPART 100 UNIT/ML IJ SOLN
0.0000 [IU] | Freq: Three times a day (TID) | INTRAMUSCULAR | Status: DC
  Administered 2022-09-24 – 2022-09-27 (×7): 1 [IU] via SUBCUTANEOUS

## 2022-09-22 MED ORDER — MORPHINE SULFATE (PF) 4 MG/ML IV SOLN
4.0000 mg | Freq: Once | INTRAVENOUS | Status: AC
  Administered 2022-09-22: 4 mg via INTRAVENOUS
  Filled 2022-09-22: qty 1

## 2022-09-22 MED ORDER — ONDANSETRON HCL 4 MG/2ML IJ SOLN
4.0000 mg | Freq: Once | INTRAMUSCULAR | Status: AC
  Administered 2022-09-22: 4 mg via INTRAVENOUS
  Filled 2022-09-22: qty 2

## 2022-09-22 MED ORDER — POLYETHYLENE GLYCOL 3350 17 G PO PACK: 17.0000 g | PACK | Freq: Every day | ORAL | Status: DC | PRN

## 2022-09-22 MED ORDER — DICLOFENAC SODIUM 1 % EX GEL
2.0000 g | Freq: Four times a day (QID) | CUTANEOUS | Status: DC
  Administered 2022-09-22 – 2022-09-28 (×18): 2 g via TOPICAL
  Filled 2022-09-22: qty 100

## 2022-09-22 MED ORDER — OXYBUTYNIN CHLORIDE 5 MG PO TABS
10.0000 mg | ORAL_TABLET | Freq: Two times a day (BID) | ORAL | Status: DC
  Administered 2022-09-22 – 2022-09-28 (×12): 10 mg via ORAL
  Filled 2022-09-22 (×12): qty 2

## 2022-09-22 MED ORDER — FLUOXETINE HCL 20 MG PO CAPS
20.0000 mg | ORAL_CAPSULE | Freq: Every day | ORAL | Status: DC
  Administered 2022-09-23 – 2022-09-28 (×6): 20 mg via ORAL
  Filled 2022-09-22 (×6): qty 1

## 2022-09-22 MED ORDER — LOSARTAN POTASSIUM 50 MG PO TABS
25.0000 mg | ORAL_TABLET | Freq: Every day | ORAL | Status: DC
  Administered 2022-09-23 – 2022-09-28 (×5): 25 mg via ORAL
  Filled 2022-09-22 (×5): qty 1

## 2022-09-22 MED ORDER — SODIUM CHLORIDE 0.9 % IV BOLUS
1000.0000 mL | Freq: Once | INTRAVENOUS | Status: AC
  Administered 2022-09-22: 1000 mL via INTRAVENOUS

## 2022-09-22 MED ORDER — ATORVASTATIN CALCIUM 40 MG PO TABS
40.0000 mg | ORAL_TABLET | Freq: Every day | ORAL | Status: DC
  Administered 2022-09-23 – 2022-09-28 (×6): 40 mg via ORAL
  Filled 2022-09-22 (×6): qty 1

## 2022-09-22 MED ORDER — HYDROMORPHONE HCL 1 MG/ML IJ SOLN
0.5000 mg | INTRAMUSCULAR | Status: DC | PRN
  Administered 2022-09-22 – 2022-09-23 (×3): 0.5 mg via INTRAVENOUS
  Filled 2022-09-22 (×3): qty 0.5

## 2022-09-22 MED ORDER — HYDROCODONE-ACETAMINOPHEN 5-325 MG PO TABS
1.0000 | ORAL_TABLET | Freq: Four times a day (QID) | ORAL | Status: DC | PRN
  Administered 2022-09-25 – 2022-09-27 (×3): 1 via ORAL
  Filled 2022-09-22 (×4): qty 1

## 2022-09-22 MED ORDER — PANTOPRAZOLE SODIUM 40 MG PO TBEC
40.0000 mg | DELAYED_RELEASE_TABLET | Freq: Two times a day (BID) | ORAL | Status: DC
  Administered 2022-09-22 – 2022-09-28 (×12): 40 mg via ORAL
  Filled 2022-09-22 (×12): qty 1

## 2022-09-22 MED ORDER — FENTANYL CITRATE PF 50 MCG/ML IJ SOSY
50.0000 ug | PREFILLED_SYRINGE | Freq: Once | INTRAMUSCULAR | Status: AC
  Administered 2022-09-22: 50 ug via INTRAVENOUS
  Filled 2022-09-22: qty 1

## 2022-09-22 NOTE — ED Notes (Signed)
Pt was incontinent of Bm, cleaned pt

## 2022-09-22 NOTE — Consult Note (Signed)
Reason for Consult:Right hip fx Referring Physician: Edwin Dada Time called: 1450 Time at bedside: 1457   Rhonda Fields is an 72 y.o. female.  HPI: Rhonda Fields was going to the bathroom early this morning when her right leg, which is weak anyway, gave way and she fell. She had immediate right hip pain and could not get up. She was brought to the ED where x-rays showed a hip fx and orthopedic surgery was consulted. She had a fall last Monday and 2 more on Saturday and possibly have some rib fxs. She lives at home with family and lately has been ambulating with a cane or RW.  Past Medical History:  Diagnosis Date   Anxiety    Arrhythmia    Chronic headaches    Depression    Diabetes mellitus without complication (HCC)    GERD (gastroesophageal reflux disease)    Hard of hearing    IBS (irritable bowel syndrome) 1986    Past Surgical History:  Procedure Laterality Date   ABDOMINAL HYSTERECTOMY     CHOLECYSTECTOMY     ERCP  04/20/2011   Procedure: ENDOSCOPIC RETROGRADE CHOLANGIOPANCREATOGRAPHY (ERCP);  Surgeon: Louis Meckel, MD;  Location: [facility];  Service: Endoscopy;  Laterality: N/A;  case is at 1430 in or   ERCP  04/20/2011   Procedure: ENDOSCOPIC RETROGRADE CHOLANGIOPANCREATOGRAPHY (ERCP);  Surgeon: Louis Meckel, MD;  Location: [facility];  Service: Gastroenterology;  Laterality: N/A;    Family History  Adopted: Yes  Problem Relation Age of Onset   Diabetes Other        Siblings, both sets of grandparents   Liver disease Mother    Kidney disease Mother    Liver disease Brother    Malignant hyperthermia Neg Hx     Social History:  reports that she has been smoking cigarettes. She has a 40 pack-year smoking history. She has never used smokeless tobacco. She reports that she does not drink alcohol and does not use drugs.  Allergies:  Allergies  Allergen Reactions   Ivp Dye [Iodinated Contrast Media] Itching and Nausea And Vomiting   Penicillins Hives and  Itching    Medications: I have reviewed the patient's current medications.  Results for orders placed or performed during the hospital encounter of 09/22/22 (from the past 48 hour(s))  Basic metabolic panel     Status: Abnormal   Collection Time: 09/22/22 11:01 AM  Result Value Ref Range   Sodium 132 (L) 135 - 145 mmol/L   Potassium 4.8 3.5 - 5.1 mmol/L   Chloride 100 98 - 111 mmol/L   CO2 12 (L) 22 - 32 mmol/L   Glucose, Bld 105 (H) 70 - 99 mg/dL    Comment: Glucose reference range applies only to samples taken after fasting for at least 8 hours.   BUN 39 (H) 8 - 23 mg/dL   Creatinine, Ser 5.78 (H) 0.44 - 1.00 mg/dL   Calcium 9.0 8.9 - 46.9 mg/dL   GFR, Estimated 37 (L) >60 mL/min    Comment: (NOTE) Calculated using the CKD-EPI Creatinine Equation (2021)    Anion gap 20 (H) 5 - 15    Comment: Performed at [facility], [address], [place], [place] [address]  CBC with Differential     Status: Abnormal   Collection Time: 09/22/22 11:01 AM  Result Value Ref Range   WBC 17.3 (H) 4.0 - 10.5 K/uL   RBC 3.72 (L) 3.87 - 5.11 MIL/uL   Hemoglobin 10.8 (L)  12.0 - 15.0 g/dL   HCT 16.1 (L) 09.6 - 04.5 %   MCV 89.5 80.0 - 100.0 fL   MCH 29.0 26.0 - 34.0 pg   MCHC 32.4 30.0 - 36.0 g/dL   RDW 40.9 81.1 - 91.4 %   Platelets 346 150 - 400 K/uL   nRBC 0.0 0.0 - 0.2 %   Neutrophils Relative % 91 %   Neutro Abs 15.7 (H) 1.7 - 7.7 K/uL   Lymphocytes Relative 4 %   Lymphs Abs 0.7 0.7 - 4.0 K/uL   Monocytes Relative 4 %   Monocytes Absolute 0.7 0.1 - 1.0 K/uL   Eosinophils Relative 0 %   Eosinophils Absolute 0.0 0.0 - 0.5 K/uL   Basophils Relative 0 %   Basophils Absolute 0.1 0.0 - 0.1 K/uL   Immature Granulocytes 1 %   Abs Immature Granulocytes 0.13 (H) 0.00 - 0.07 K/uL    Comment: Performed at [facility], [address] [place], [place] [address]  CK     Status: Abnormal   Collection Time: 09/22/22 11:01 AM  Result Value Ref Range   Total CK 297 (H) 38 - 234 U/L     Comment: Performed at [facility], [address] [place], [place] [address]    CT HIP RIGHT WO CONTRAST  Result Date: 09/22/2022 CLINICAL DATA:  Right hip fracture after a fall. EXAM: CT OF THE RIGHT HIP WITHOUT CONTRAST TECHNIQUE: Multidetector CT imaging of the right hip was performed according to the standard protocol. Multiplanar CT image reconstructions were also generated. RADIATION DOSE REDUCTION: This exam was performed according to the departmental dose-optimization program which includes automated exposure control, adjustment of the mA and/or kV according to patient size and/or use of iterative reconstruction technique. COMPARISON:  Right hip x-rays from same day. CT abdomen pelvis dated January 01, 2022. FINDINGS: Bones/Joint/Cartilage Acute impacted basicervical fracture of the right femoral neck with significant varus angulation. Additional comminuted fracture of the superior greater trochanter. The lesser trochanter is intact. No dislocation. Mild diffuse right hip joint space narrowing with small marginal osteophytes. No joint effusion. Ligaments Ligaments are suboptimally evaluated by CT. Muscles and Tendons Grossly intact.  No significant muscle atrophy. Soft tissue Mild lateral hip soft tissue swelling. No fluid collection or hematoma. No soft tissue mass. IMPRESSION: 1. Acute basicervical fracture of the right femoral neck. Additional comminuted fracture of the superior greater trochanter. Electronically Signed   By: Obie Dredge M.D.   On: 09/22/2022 14:45   CT Head Wo Contrast  Result Date: 09/22/2022 CLINICAL DATA:  Head trauma, minor (Age >= 65y); Neck trauma (Age >= 65y) EXAM: CT HEAD WITHOUT CONTRAST CT CERVICAL SPINE WITHOUT CONTRAST TECHNIQUE: Multidetector CT imaging of the head and cervical spine was performed following the standard protocol without intravenous contrast. Multiplanar CT image reconstructions of the cervical spine were also generated. RADIATION DOSE  REDUCTION: This exam was performed according to the departmental dose-optimization program which includes automated exposure control, adjustment of the mA and/or kV according to patient size and/or use of iterative reconstruction technique. COMPARISON:  None Available. FINDINGS: CT HEAD FINDINGS Brain: No evidence of acute infarction, hemorrhage, hydrocephalus, extra-axial collection or mass lesion/mass effect. Sequela of mild chronic microvascular ischemic change. Vascular: No hyperdense vessel or unexpected calcification. Skull: Normal. Negative for fracture or focal lesion. Sinuses/Orbits: No middle ear or mastoid effusion. Paranasal sinuses are clear. Mucosal thickening bilateral sphenoid sinuses. Orbits are unremarkable. Other: None. CT CERVICAL SPINE FINDINGS Alignment: Grade  1 anterolisthesis of C4 on C5 and C5 on C6. Skull base and vertebrae: No acute fracture. No primary bone lesion or focal pathologic process. Soft tissues and spinal canal: No prevertebral fluid or swelling. No visible canal hematoma. Disc levels:  No evidence of high-grade spinal canal stenosis. Upper chest: Negative. Other: 1.1 cm. This is not meet size criteria for further workup. Right thyroid nodule IMPRESSION: 1. No acute intracranial abnormality. 2. No acute fracture or traumatic malalignment of the cervical spine. Electronically Signed   By: Lorenza Cambridge M.D.   On: 09/22/2022 14:16   CT Cervical Spine Wo Contrast  Result Date: 09/22/2022 CLINICAL DATA:  Head trauma, minor (Age >= 65y); Neck trauma (Age >= 65y) EXAM: CT HEAD WITHOUT CONTRAST CT CERVICAL SPINE WITHOUT CONTRAST TECHNIQUE: Multidetector CT imaging of the head and cervical spine was performed following the standard protocol without intravenous contrast. Multiplanar CT image reconstructions of the cervical spine were also generated. RADIATION DOSE REDUCTION: This exam was performed according to the departmental dose-optimization program which includes automated  exposure control, adjustment of the mA and/or kV according to patient size and/or use of iterative reconstruction technique. COMPARISON:  None Available. FINDINGS: CT HEAD FINDINGS Brain: No evidence of acute infarction, hemorrhage, hydrocephalus, extra-axial collection or mass lesion/mass effect. Sequela of mild chronic microvascular ischemic change. Vascular: No hyperdense vessel or unexpected calcification. Skull: Normal. Negative for fracture or focal lesion. Sinuses/Orbits: No middle ear or mastoid effusion. Paranasal sinuses are clear. Mucosal thickening bilateral sphenoid sinuses. Orbits are unremarkable. Other: None. CT CERVICAL SPINE FINDINGS Alignment: Grade 1 anterolisthesis of C4 on C5 and C5 on C6. Skull base and vertebrae: No acute fracture. No primary bone lesion or focal pathologic process. Soft tissues and spinal canal: No prevertebral fluid or swelling. No visible canal hematoma. Disc levels:  No evidence of high-grade spinal canal stenosis. Upper chest: Negative. Other: 1.1 cm. This is not meet size criteria for further workup. Right thyroid nodule IMPRESSION: 1. No acute intracranial abnormality. 2. No acute fracture or traumatic malalignment of the cervical spine. Electronically Signed   By: Lorenza Cambridge M.D.   On: 09/22/2022 14:16   DG Ankle Complete Right  Result Date: 09/22/2022 CLINICAL DATA:  Multiple falls, pain. EXAM: RIGHT ANKLE - COMPLETE 3+ VIEW COMPARISON:  None Available. FINDINGS: No acute ankle fracture. Normal ankle alignment, no dislocation. The ankle mortise is preserved. Talar dome is intact. 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 There is a small plantar calcaneal spur. No ankle joint effusion. Fifth metatarsal shaft fracture is only included on the lateral view, suspect this is chronic with surrounding callus but incompletely characterized. IMPRESSION: 1. No acute ankle fracture. 2. Fifth metatarsal shaft fracture is  only included on the lateral view, suspect this is chronic with surrounding callus but incompletely characterized. Recommend dedicated foot radiograph. Electronically Signed   By: Narda Rutherford M.D.   On: 09/22/2022 13:40   DG Hip Unilat W or Wo Pelvis 2-3 Views Right  Result Date: 09/22/2022 CLINICAL DATA:  Right leg pain, fall. EXAM: DG HIP (WITH OR WITHOUT PELVIS) 2-3V RIGHT COMPARISON:  None Available. FINDINGS: Right proximal femur fracture is likely transcervical femoral neck fracture, although assessed on subsequent CT. This is at the femoral neck intertrochanteric junction. Mild apex lateral angulation and proximal foreshortening. The femoral head remains seated. The bony pelvis is intact, no pubic rami fractures. No pubic symphyseal or sacroiliac diastasis. IMPRESSION: Right proximal femur fracture is likely transcervical  femoral neck fracture. This has been further characterized on subsequent CT. Electronically Signed   By: Narda Rutherford M.D.   On: 09/22/2022 13:38   DG Chest 2 View  Result Date: 09/22/2022 CLINICAL DATA:  Multiple falls, pain. EXAM: CHEST - 2 VIEW COMPARISON:  Radiograph 02/16/2012 FINDINGS: Lung volumes are low. The heart is mildly enlarged. Mediastinal contours are normal. No pneumothorax or pleural effusion. Small retrocardiac hiatal hernia. No focal airspace disease. Remote left anterior rib fractures. Compression fracture of the lower thoracic vertebra, age indeterminate. IMPRESSION: 1. Low lung volumes.  No evidence of intrathoracic injury. 2. Compression fracture of a lower thoracic vertebra, age indeterminate. Recommend correlation for acute thoracic back pain. 3. Mild cardiomegaly. Small retrocardiac hiatal hernia. Electronically Signed   By: Narda Rutherford M.D.   On: 09/22/2022 13:37   DG Shoulder Left  Result Date: 09/22/2022 CLINICAL DATA:  Multiple falls, pain. EXAM: LEFT SHOULDER - 2+ VIEW COMPARISON:  None Available. FINDINGS: There is no evidence of  fracture or dislocation. There is no evidence of arthropathy or other focal bone abnormality. Soft tissues are unremarkable. IMPRESSION: No fracture or dislocation of the left shoulder. Electronically Signed   By: Narda Rutherford M.D.   On: 09/22/2022 13:34    Review of Systems  HENT:  Negative for ear discharge, ear pain, hearing loss and tinnitus.   Eyes:  Negative for photophobia and pain.  Respiratory:  Negative for cough and shortness of breath.   Cardiovascular:  Positive for chest pain and leg swelling.  Gastrointestinal:  Negative for abdominal pain, nausea and vomiting.  Genitourinary:  Negative for dysuria, flank pain, frequency and urgency.  Musculoskeletal:  Positive for arthralgias (Right hip). Negative for back pain, myalgias and neck pain.  Neurological:  Positive for weakness (RLE). Negative for dizziness and headaches.  Hematological:  Does not bruise/bleed easily.  Psychiatric/Behavioral:  The patient is not nervous/anxious.    Blood pressure (!) 140/76, pulse 89, temperature 98.1 F (36.7 C), temperature source Oral, resp. rate 18, height 5\' 4"  (1.626 m), weight 65.4 kg, SpO2 97%. Physical Exam Constitutional:      General: She is not in acute distress.    Appearance: She is well-developed. She is not diaphoretic.  HENT:     Head: Normocephalic and atraumatic.  Eyes:     General: No scleral icterus.       Right eye: No discharge.        Left eye: No discharge.     Conjunctiva/sclera: Conjunctivae normal.  Cardiovascular:     Rate and Rhythm: Normal rate and regular rhythm.  Pulmonary:     Effort: Pulmonary effort is normal. No respiratory distress.  Musculoskeletal:     Cervical back: Normal range of motion.     Comments: RLE No traumatic wounds, ecchymosis, or rash  Mod TTP hip  No knee or ankle effusion  Knee stable to varus/ valgus and anterior/posterior stress  Sens DPN, SPN, TN intact  Motor EHL, ext, flex, evers 5/5  DP 1+, PT 1+, 2+ edema  Skin:     General: Skin is warm and dry.  Neurological:     Mental Status: She is alert.  Psychiatric:        Mood and Affect: Mood normal.        Behavior: Behavior normal.     Assessment/Plan: Right hip fx -- Plan IMN tomorrow with Dr. Steward Drone most likely. Please keep NPO after MN.    Freeman Caldron, PA-C Orthopedic Surgery [phone]  09/22/2022, 3:20 PM
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
## 2022-09-22 NOTE — ED Notes (Signed)
ED TO INPATIENT HANDOFF REPORT  ED Nurse Name and Phone #: [phone]  S Name/Age/Gender Rhonda Fields 72 y.o. female Room/Bed: 012C/012C  Code Status   Code Status: Full Code  Home/SNF/Other Home Patient oriented to: self, place, time, and situation Is this baseline? Yes   Triage Complete: Triage complete  Chief Complaint Other fracture of right femur, initial encounter for closed fracture [facility]) [S72.8X1A]  Triage Note Pt arrived via [facility] from home for a fall. Pt states her right leg gave out and she fell. Pt states when she fell, she felt a pop in her right hip area. Pt states she laid on the floor for 3 hrs before family woke up and found her. Pt denies hitting head or blood thinners. Pt has right hip shortening and rotation. Pt has 1+ pedal pulse, cap refill less than 3 sec, cool to touch, pt able to wiggle toes. Pt has had several falls this past week. Pt c/o bilat rib pain since Tuesday when her niece put her on her back. Pt has large ecchymotic area on right outer thigh and right inner thigh.   Allergies Allergies  Allergen Reactions   Ivp Dye [Iodinated Contrast Media] Itching and Nausea And Vomiting   Penicillins Hives and Itching    Level of Care/Admitting Diagnosis ED Disposition     ED Disposition  Admit   Condition  --   Comment  Hospital Area: [facility] [[id number]]  Level of Care: Telemetry Surgical [[id number]]  May place patient in observation at [facility] or [facility] if equivalent level of care is available:: No  Covid Evaluation: Asymptomatic - no recent exposure (last 10 days) testing not required  Diagnosis: Other fracture of right femur, initial encounter for closed fracture [facility]) [[id number]]  Admitting Physician: Synetta Fail [[id number]]  Attending Physician: Synetta Fail [[id number]]          B Medical/Surgery History Past Medical History:  Diagnosis Date   Anxiety    Arrhythmia    Chronic  headaches    Depression    Diabetes mellitus without complication (HCC)    Generalized abdominal pain 04/29/2011   GERD (gastroesophageal reflux disease)    Hard of hearing    IBS (irritable bowel syndrome) 01/20/1984   Past Surgical History:  Procedure Laterality Date   ABDOMINAL HYSTERECTOMY     CHOLECYSTECTOMY     ERCP  04/20/2011   Procedure: ENDOSCOPIC RETROGRADE CHOLANGIOPANCREATOGRAPHY (ERCP);  Surgeon: Louis Meckel, MD;  Location: [facility];  Service: Endoscopy;  Laterality: N/A;  case is at 1430 in or   ERCP  04/20/2011   Procedure: ENDOSCOPIC RETROGRADE CHOLANGIOPANCREATOGRAPHY (ERCP);  Surgeon: Louis Meckel, MD;  Location: [facility];  Service: Gastroenterology;  Laterality: N/A;     A IV Location/Drains/Wounds Patient Lines/Drains/Airways Status     Active Line/Drains/Airways     Name Placement date Placement time Site Days   Peripheral IV 09/22/22 22 G Posterior;Right Hand 09/22/22  --  Hand  less than 1   GI Stent 4 Fr. 04/20/11  1625  --  4173            Intake/Output Last 24 hours No intake or output data in the 24 hours ending 09/22/22 1619  Labs/Imaging Results for orders placed or performed during the hospital encounter of 09/22/22 (from the past 48 hour(s))  Basic metabolic panel     Status: Abnormal   Collection Time: 09/22/22 11:01 AM  Result Value Ref Range  Sodium 132 (L) 135 - 145 mmol/L   Potassium 4.8 3.5 - 5.1 mmol/L   Chloride 100 98 - 111 mmol/L   CO2 12 (L) 22 - 32 mmol/L   Glucose, Bld 105 (H) 70 - 99 mg/dL    Comment: Glucose reference range applies only to samples taken after fasting for at least 8 hours.   BUN 39 (H) 8 - 23 mg/dL   Creatinine, Ser 6.04 (H) 0.44 - 1.00 mg/dL   Calcium 9.0 8.9 - 54.0 mg/dL   GFR, Estimated 37 (L) >60 mL/min    Comment: (NOTE) Calculated using the CKD-EPI Creatinine Equation (2021)    Anion gap 20 (H) 5 - 15    Comment: Performed at [facility], [address], [place], [place] [address]   CBC with Differential     Status: Abnormal   Collection Time: 09/22/22 11:01 AM  Result Value Ref Range   WBC 17.3 (H) 4.0 - 10.5 K/uL   RBC 3.72 (L) 3.87 - 5.11 MIL/uL   Hemoglobin 10.8 (L) 12.0 - 15.0 g/dL   HCT 14.7 (L) 82.9 - 56.2 %   MCV 89.5 80.0 - 100.0 fL   MCH 29.0 26.0 - 34.0 pg   MCHC 32.4 30.0 - 36.0 g/dL   RDW 13.0 86.5 - 78.4 %   Platelets 346 150 - 400 K/uL   nRBC 0.0 0.0 - 0.2 %   Neutrophils Relative % 91 %   Neutro Abs 15.7 (H) 1.7 - 7.7 K/uL   Lymphocytes Relative 4 %   Lymphs Abs 0.7 0.7 - 4.0 K/uL   Monocytes Relative 4 %   Monocytes Absolute 0.7 0.1 - 1.0 K/uL   Eosinophils Relative 0 %   Eosinophils Absolute 0.0 0.0 - 0.5 K/uL   Basophils Relative 0 %   Basophils Absolute 0.1 0.0 - 0.1 K/uL   Immature Granulocytes 1 %   Abs Immature Granulocytes 0.13 (H) 0.00 - 0.07 K/uL    Comment: Performed at [facility], [address], [place], [place] [address]  CK     Status: Abnormal   Collection Time: 09/22/22 11:01 AM  Result Value Ref Range   Total CK 297 (H) 38 - 234 U/L    Comment: Performed at [facility], [address], [place], [place] [address]   CT HIP RIGHT WO CONTRAST  Result Date: 09/22/2022 CLINICAL DATA:  Right hip fracture after a fall. EXAM: CT OF THE RIGHT HIP WITHOUT CONTRAST TECHNIQUE: Multidetector CT imaging of the right hip was performed according to the standard protocol. Multiplanar CT image reconstructions were also generated. RADIATION DOSE REDUCTION: This exam was performed according to the departmental dose-optimization program which includes automated exposure control, adjustment of the mA and/or kV according to patient size and/or use of iterative reconstruction technique. COMPARISON:  Right hip x-rays from same day. CT abdomen pelvis dated January 01, 2022. FINDINGS: Bones/Joint/Cartilage Acute impacted basicervical fracture of the right femoral neck with significant varus angulation. Additional comminuted fracture of  the superior greater trochanter. The lesser trochanter is intact. No dislocation. Mild diffuse right hip joint space narrowing with small marginal osteophytes. No joint effusion. Ligaments Ligaments are suboptimally evaluated by CT. Muscles and Tendons Grossly intact.  No significant muscle atrophy. Soft tissue Mild lateral hip soft tissue swelling. No fluid collection or hematoma. No soft tissue mass. IMPRESSION: 1. Acute basicervical fracture of the right femoral neck. Additional comminuted fracture of the superior greater trochanter. Electronically Signed  By: Obie Dredge M.D.   On: 09/22/2022 14:45   CT Head Wo Contrast  Result Date: 09/22/2022 CLINICAL DATA:  Head trauma, minor (Age >= 65y); Neck trauma (Age >= 65y) EXAM: CT HEAD WITHOUT CONTRAST CT CERVICAL SPINE WITHOUT CONTRAST TECHNIQUE: Multidetector CT imaging of the head and cervical spine was performed following the standard protocol without intravenous contrast. Multiplanar CT image reconstructions of the cervical spine were also generated. RADIATION DOSE REDUCTION: This exam was performed according to the departmental dose-optimization program which includes automated exposure control, adjustment of the mA and/or kV according to patient size and/or use of iterative reconstruction technique. COMPARISON:  None Available. FINDINGS: CT HEAD FINDINGS Brain: No evidence of acute infarction, hemorrhage, hydrocephalus, extra-axial collection or mass lesion/mass effect. Sequela of mild chronic microvascular ischemic change. Vascular: No hyperdense vessel or unexpected calcification. Skull: Normal. Negative for fracture or focal lesion. Sinuses/Orbits: No middle ear or mastoid effusion. Paranasal sinuses are clear. Mucosal thickening bilateral sphenoid sinuses. Orbits are unremarkable. Other: None. CT CERVICAL SPINE FINDINGS Alignment: Grade 1 anterolisthesis of C4 on C5 and C5 on C6. Skull base and vertebrae: No acute fracture. No primary bone lesion  or focal pathologic process. Soft tissues and spinal canal: No prevertebral fluid or swelling. No visible canal hematoma. Disc levels:  No evidence of high-grade spinal canal stenosis. Upper chest: Negative. Other: 1.1 cm. This is not meet size criteria for further workup. Right thyroid nodule IMPRESSION: 1. No acute intracranial abnormality. 2. No acute fracture or traumatic malalignment of the cervical spine. Electronically Signed   By: Lorenza Cambridge M.D.   On: 09/22/2022 14:16   CT Cervical Spine Wo Contrast  Result Date: 09/22/2022 CLINICAL DATA:  Head trauma, minor (Age >= 65y); Neck trauma (Age >= 65y) EXAM: CT HEAD WITHOUT CONTRAST CT CERVICAL SPINE WITHOUT CONTRAST TECHNIQUE: Multidetector CT imaging of the head and cervical spine was performed following the standard protocol without intravenous contrast. Multiplanar CT image reconstructions of the cervical spine were also generated. RADIATION DOSE REDUCTION: This exam was performed according to the departmental dose-optimization program which includes automated exposure control, adjustment of the mA and/or kV according to patient size and/or use of iterative reconstruction technique. COMPARISON:  None Available. FINDINGS: CT HEAD FINDINGS Brain: No evidence of acute infarction, hemorrhage, hydrocephalus, extra-axial collection or mass lesion/mass effect. Sequela of mild chronic microvascular ischemic change. Vascular: No hyperdense vessel or unexpected calcification. Skull: Normal. Negative for fracture or focal lesion. Sinuses/Orbits: No middle ear or mastoid effusion. Paranasal sinuses are clear. Mucosal thickening bilateral sphenoid sinuses. Orbits are unremarkable. Other: None. CT CERVICAL SPINE FINDINGS Alignment: Grade 1 anterolisthesis of C4 on C5 and C5 on C6. Skull base and vertebrae: No acute fracture. No primary bone lesion or focal pathologic process. Soft tissues and spinal canal: No prevertebral fluid or swelling. No visible canal hematoma.  Disc levels:  No evidence of high-grade spinal canal stenosis. Upper chest: Negative. Other: 1.1 cm. This is not meet size criteria for further workup. Right thyroid nodule IMPRESSION: 1. No acute intracranial abnormality. 2. No acute fracture or traumatic malalignment of the cervical spine. Electronically Signed   By: Lorenza Cambridge M.D.   On: 09/22/2022 14:16   DG Ankle Complete Right  Result Date: 09/22/2022 CLINICAL DATA:  Multiple falls, pain. EXAM: RIGHT ANKLE - COMPLETE 3+ VIEW COMPARISON:  None Available. FINDINGS: No acute ankle fracture. Normal ankle alignment, no dislocation. The ankle mortise is preserved. Talar dome is intact. 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 There is a small plantar calcaneal spur. No ankle joint effusion. Fifth metatarsal shaft fracture is only included on the lateral view, suspect this is chronic with surrounding callus but incompletely characterized. IMPRESSION: 1. No acute ankle fracture. 2. Fifth metatarsal shaft fracture is only included on the lateral view, suspect this is chronic with surrounding callus but incompletely characterized. Recommend dedicated foot radiograph. Electronically Signed   By: Narda Rutherford M.D.   On: 09/22/2022 13:40   DG Hip Unilat W or Wo Pelvis 2-3 Views Right  Result Date: 09/22/2022 CLINICAL DATA:  Right leg pain, fall. EXAM: DG HIP (WITH OR WITHOUT PELVIS) 2-3V RIGHT COMPARISON:  None Available. FINDINGS: Right proximal femur fracture is likely transcervical femoral neck fracture, although assessed on subsequent CT. This is at the femoral neck intertrochanteric junction. Mild apex lateral angulation and proximal foreshortening. The femoral head remains seated. The bony pelvis is intact, no pubic rami fractures. No pubic symphyseal or sacroiliac diastasis. IMPRESSION: Right proximal femur fracture is likely transcervical femoral neck fracture. This has been further characterized on  subsequent CT. Electronically Signed   By: Narda Rutherford M.D.   On: 09/22/2022 13:38   DG Chest 2 View  Result Date: 09/22/2022 CLINICAL DATA:  Multiple falls, pain. EXAM: CHEST - 2 VIEW COMPARISON:  Radiograph 02/16/2012 FINDINGS: Lung volumes are low. The heart is mildly enlarged. Mediastinal contours are normal. No pneumothorax or pleural effusion. Small retrocardiac hiatal hernia. No focal airspace disease. Remote left anterior rib fractures. Compression fracture of the lower thoracic vertebra, age indeterminate. IMPRESSION: 1. Low lung volumes.  No evidence of intrathoracic injury. 2. Compression fracture of a lower thoracic vertebra, age indeterminate. Recommend correlation for acute thoracic back pain. 3. Mild cardiomegaly. Small retrocardiac hiatal hernia. Electronically Signed   By: Narda Rutherford M.D.   On: 09/22/2022 13:37   DG Shoulder Left  Result Date: 09/22/2022 CLINICAL DATA:  Multiple falls, pain. EXAM: LEFT SHOULDER - 2+ VIEW COMPARISON:  None Available. FINDINGS: There is no evidence of fracture or dislocation. There is no evidence of arthropathy or other focal bone abnormality. Soft tissues are unremarkable. IMPRESSION: No fracture or dislocation of the left shoulder. Electronically Signed   By: Narda Rutherford M.D.   On: 09/22/2022 13:34    Pending Labs Unresulted Labs (From admission, onward)     Start     Ordered   09/23/22 0500  CBC  Tomorrow morning,   R        09/22/22 1602   09/23/22 0500  Basic metabolic panel  Tomorrow morning,   R        09/22/22 1602   09/23/22 0500  Type and screen [facility]  Once,   R       Comments: [facility]    09/22/22 1602   09/22/22 1603  Procalcitonin  Daily,   R     References:    Procalcitonin Lower Respiratory Tract Infection AND Sepsis Procalcitonin Algorithm   09/22/22 1602   09/22/22 1147  Urinalysis, Routine w reflex microscopic -Urine, Clean Catch  Once,   URGENT       Question:   Specimen Source  Answer:  Urine, Clean Catch   09/22/22 1146            Vitals/Pain Today's Vitals   09/22/22 1151 09/22/22 1153 09/22/22 1413 09/22/22 1426  BP:   (!) 140/76   Pulse:   89   Resp:  18   Temp:    98.1 F (36.7 C)  TempSrc:    Oral  SpO2:   97%   Weight:      Height:      PainSc: 10-Worst pain ever 10-Worst pain ever      Isolation Precautions No active isolations  Medications Medications  pantoprazole (PROTONIX) EC tablet 40 mg (has no administration in time range)  HYDROcodone-acetaminophen (NORCO/VICODIN) 5-325 MG per tablet 1 tablet (has no administration in time range)  HYDROmorphone (DILAUDID) injection 0.5 mg (has no administration in time range)  polyethylene glycol (MIRALAX / GLYCOLAX) packet 17 g (has no administration in time range)  morphine (PF) 4 MG/ML injection 4 mg (4 mg Intravenous Given 09/22/22 1104)  ondansetron (ZOFRAN) injection 4 mg (4 mg Intravenous Given 09/22/22 1104)  sodium chloride 0.9 % bolus 1,000 mL (1,000 mLs Intravenous New Bag/Given 09/22/22 1357)  fentaNYL (SUBLIMAZE) injection 50 mcg (50 mcg Intravenous Given 09/22/22 1615)    Mobility walks with device     Focused Assessments Musculoskeletal   R Recommendations: See Admitting Provider Note  Report given to:   Additional Notes: Right hip fracture. Going to surgery tomorrow for a IMN. Patient is non ambulatory right now she is using a [employer]
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
## 2022-09-22 NOTE — Plan of Care (Signed)

## 2022-09-23 ENCOUNTER — Other Ambulatory Visit: Payer: Self-pay

## 2022-09-23 ENCOUNTER — Observation Stay (HOSPITAL_COMMUNITY): Payer: Medicare PPO

## 2022-09-23 ENCOUNTER — Encounter (HOSPITAL_COMMUNITY)
Admission: EM | Disposition: A | Discharge status: Skilled Nursing Facility | Payer: Self-pay | Source: Home / Self Care | Attending: Internal Medicine

## 2022-09-23 ENCOUNTER — Inpatient Hospital Stay (HOSPITAL_COMMUNITY): Payer: Medicare PPO

## 2022-09-23 ENCOUNTER — Encounter (HOSPITAL_COMMUNITY): Payer: Self-pay | Admitting: Internal Medicine

## 2022-09-23 DIAGNOSIS — Z609 Problem related to social environment, unspecified: Secondary | ICD-10-CM | POA: Diagnosis present

## 2022-09-23 DIAGNOSIS — E860 Dehydration: Secondary | ICD-10-CM | POA: Diagnosis present

## 2022-09-23 DIAGNOSIS — E871 Hypo-osmolality and hyponatremia: Secondary | ICD-10-CM | POA: Diagnosis present

## 2022-09-23 DIAGNOSIS — D62 Acute posthemorrhagic anemia: Secondary | ICD-10-CM | POA: Diagnosis not present

## 2022-09-23 DIAGNOSIS — I10 Essential (primary) hypertension: Secondary | ICD-10-CM

## 2022-09-23 DIAGNOSIS — F17210 Nicotine dependence, cigarettes, uncomplicated: Secondary | ICD-10-CM

## 2022-09-23 DIAGNOSIS — E785 Hyperlipidemia, unspecified: Secondary | ICD-10-CM

## 2022-09-23 DIAGNOSIS — D72829 Elevated white blood cell count, unspecified: Secondary | ICD-10-CM | POA: Diagnosis present

## 2022-09-23 DIAGNOSIS — S4292XA Fracture of left shoulder girdle, part unspecified, initial encounter for closed fracture: Secondary | ICD-10-CM | POA: Diagnosis present

## 2022-09-23 DIAGNOSIS — F419 Anxiety disorder, unspecified: Secondary | ICD-10-CM | POA: Diagnosis present

## 2022-09-23 DIAGNOSIS — S72009A Fracture of unspecified part of neck of unspecified femur, initial encounter for closed fracture: Secondary | ICD-10-CM | POA: Diagnosis present

## 2022-09-23 DIAGNOSIS — E8721 Acute metabolic acidosis: Secondary | ICD-10-CM | POA: Diagnosis present

## 2022-09-23 DIAGNOSIS — Z638 Other specified problems related to primary support group: Secondary | ICD-10-CM | POA: Diagnosis not present

## 2022-09-23 DIAGNOSIS — S728X1A Other fracture of right femur, initial encounter for closed fracture: Secondary | ICD-10-CM | POA: Diagnosis not present

## 2022-09-23 DIAGNOSIS — W19XXXA Unspecified fall, initial encounter: Secondary | ICD-10-CM | POA: Diagnosis present

## 2022-09-23 DIAGNOSIS — E119 Type 2 diabetes mellitus without complications: Secondary | ICD-10-CM | POA: Diagnosis present

## 2022-09-23 DIAGNOSIS — S72341A Displaced spiral fracture of shaft of right femur, initial encounter for closed fracture: Secondary | ICD-10-CM | POA: Diagnosis not present

## 2022-09-23 DIAGNOSIS — K219 Gastro-esophageal reflux disease without esophagitis: Secondary | ICD-10-CM | POA: Diagnosis present

## 2022-09-23 DIAGNOSIS — N179 Acute kidney failure, unspecified: Secondary | ICD-10-CM | POA: Diagnosis present

## 2022-09-23 DIAGNOSIS — M96661 Fracture of femur following insertion of orthopedic implant, joint prosthesis, or bone plate, right leg: Secondary | ICD-10-CM | POA: Diagnosis not present

## 2022-09-23 DIAGNOSIS — F32A Depression, unspecified: Secondary | ICD-10-CM | POA: Diagnosis present

## 2022-09-23 DIAGNOSIS — R296 Repeated falls: Secondary | ICD-10-CM | POA: Diagnosis present

## 2022-09-23 DIAGNOSIS — E876 Hypokalemia: Secondary | ICD-10-CM | POA: Diagnosis not present

## 2022-09-23 DIAGNOSIS — S72141A Displaced intertrochanteric fracture of right femur, initial encounter for closed fracture: Secondary | ICD-10-CM

## 2022-09-23 DIAGNOSIS — S92353D Displaced fracture of fifth metatarsal bone, unspecified foot, subsequent encounter for fracture with routine healing: Secondary | ICD-10-CM | POA: Diagnosis not present

## 2022-09-23 DIAGNOSIS — Z62820 Parent-biological child conflict: Secondary | ICD-10-CM | POA: Diagnosis not present

## 2022-09-23 DIAGNOSIS — E663 Overweight: Secondary | ICD-10-CM | POA: Diagnosis present

## 2022-09-23 DIAGNOSIS — M4854XA Collapsed vertebra, not elsewhere classified, thoracic region, initial encounter for fracture: Secondary | ICD-10-CM | POA: Diagnosis present

## 2022-09-23 HISTORY — PX: INTRAMEDULLARY (IM) NAIL INTERTROCHANTERIC: SHX5875

## 2022-09-23 LAB — CBC
HCT: 30.3 % — ABNORMAL LOW (ref 36.0–46.0)
HCT: 29.6 % — ABNORMAL LOW (ref 36.0–46.0)
Hemoglobin: 9.9 g/dL — ABNORMAL LOW (ref 12.0–15.0)
Hemoglobin: 9.7 g/dL — ABNORMAL LOW (ref 12.0–15.0)
MCH: 29.7 pg (ref 26.0–34.0)
MCH: 30 pg (ref 26.0–34.0)
MCHC: 32.7 g/dL (ref 30.0–36.0)
MCHC: 32.8 g/dL (ref 30.0–36.0)
MCV: 91 fL (ref 80.0–100.0)
MCV: 91.6 fL (ref 80.0–100.0)
Platelets: 288 10*3/uL (ref 150–400)
Platelets: 311 10*3/uL (ref 150–400)
RBC: 3.33 MIL/uL — ABNORMAL LOW (ref 3.87–5.11)
RBC: 3.23 MIL/uL — ABNORMAL LOW (ref 3.87–5.11)
RDW: 15.7 % — ABNORMAL HIGH (ref 11.5–15.5)
RDW: 15.9 % — ABNORMAL HIGH (ref 11.5–15.5)
WBC: 8.7 10*3/uL (ref 4.0–10.5)
WBC: 17.5 10*3/uL — ABNORMAL HIGH (ref 4.0–10.5)
nRBC: 0 % (ref 0.0–0.2)
nRBC: 0 % (ref 0.0–0.2)

## 2022-09-23 LAB — TYPE AND SCREEN
ABO/RH(D): O POS
Antibody Screen: NEGATIVE

## 2022-09-23 LAB — CREATININE, SERUM
Creatinine, Ser: 0.82 mg/dL (ref 0.44–1.00)
GFR, Estimated: >60 mL/min (ref >60)

## 2022-09-23 LAB — BASIC METABOLIC PANEL
Anion gap: 14 (ref 5–15)
BUN: 25 mg/dL — ABNORMAL HIGH (ref 8–23)
CO2: 16 mmol/L — ABNORMAL LOW (ref 22–32)
Calcium: 8.6 mg/dL — ABNORMAL LOW (ref 8.9–10.3)
Chloride: 106 mmol/L (ref 98–111)
Creatinine, Ser: 0.89 mg/dL (ref 0.44–1.00)
GFR, Estimated: >60 mL/min (ref >60)
Glucose, Bld: 110 mg/dL — ABNORMAL HIGH (ref 70–99)
Potassium: 4.3 mmol/L (ref 3.5–5.1)
Sodium: 136 mmol/L (ref 135–145)

## 2022-09-23 LAB — GLUCOSE, CAPILLARY
Glucose-Capillary: 111 mg/dL — ABNORMAL HIGH (ref 70–99)
Glucose-Capillary: 99 mg/dL (ref 70–99)
Glucose-Capillary: 127 mg/dL — ABNORMAL HIGH (ref 70–99)
Glucose-Capillary: 143 mg/dL — ABNORMAL HIGH (ref 70–99)

## 2022-09-23 LAB — SURGICAL PCR SCREEN
MRSA, PCR: NEGATIVE
Staphylococcus aureus: NEGATIVE

## 2022-09-23 LAB — PROCALCITONIN: Procalcitonin: 1.59 ng/mL

## 2022-09-23 LAB — ABO/RH: ABO/RH(D): O POS

## 2022-09-23 SURGERY — FIXATION, FRACTURE, INTERTROCHANTERIC, WITH INTRAMEDULLARY ROD
Anesthesia: General | Site: Leg Upper | Laterality: Right

## 2022-09-23 SURGERY — FIXATION, FRACTURE, INTERTROCHANTERIC, WITH INTRAMEDULLARY ROD
Anesthesia: General | Laterality: Right

## 2022-09-23 MED ORDER — CEFAZOLIN SODIUM-DEXTROSE 2-4 GM/100ML-% IV SOLN
INTRAVENOUS | Status: AC
  Filled 2022-09-23: qty 100

## 2022-09-23 MED ORDER — TRANEXAMIC ACID 1000 MG/10ML IV SOLN
2000.0000 mg | Freq: Once | INTRAVENOUS | Status: DC
  Filled 2022-09-23: qty 20

## 2022-09-23 MED ORDER — ONDANSETRON HCL 4 MG/2ML IJ SOLN
4.0000 mg | Freq: Four times a day (QID) | INTRAMUSCULAR | Status: DC | PRN
  Administered 2022-09-26: 4 mg via INTRAVENOUS
  Filled 2022-09-23: qty 2

## 2022-09-23 MED ORDER — FENTANYL CITRATE (PF) 250 MCG/5ML IJ SOLN
INTRAMUSCULAR | Status: DC | PRN
  Administered 2022-09-23 (×5): 50 ug via INTRAVENOUS

## 2022-09-23 MED ORDER — ENOXAPARIN SODIUM 40 MG/0.4ML IJ SOSY
40.0000 mg | PREFILLED_SYRINGE | INTRAMUSCULAR | Status: DC
  Administered 2022-09-24 – 2022-09-28 (×5): 40 mg via SUBCUTANEOUS
  Filled 2022-09-23 (×5): qty 0.4

## 2022-09-23 MED ORDER — ACETAMINOPHEN 325 MG PO TABS
325.0000 mg | ORAL_TABLET | Freq: Four times a day (QID) | ORAL | Status: DC | PRN
  Administered 2022-09-24 – 2022-09-28 (×7): 650 mg via ORAL
  Filled 2022-09-23 (×7): qty 2

## 2022-09-23 MED ORDER — CHLORHEXIDINE GLUCONATE 0.12 % MT SOLN
OROMUCOSAL | Status: AC
  Administered 2022-09-23: 15 mL via OROMUCOSAL
  Filled 2022-09-23: qty 15

## 2022-09-23 MED ORDER — OXYCODONE HCL 5 MG PO TABS
5.0000 mg | ORAL_TABLET | ORAL | Status: DC | PRN
  Administered 2022-09-25: 10 mg via ORAL
  Filled 2022-09-23 (×2): qty 2

## 2022-09-23 MED ORDER — ESMOLOL HCL 100 MG/10ML IV SOLN
INTRAVENOUS | Status: DC | PRN
Start: 2022-09-23 — End: 2022-09-23
  Administered 2022-09-23: 30 mg via INTRAVENOUS

## 2022-09-23 MED ORDER — PROPOFOL 10 MG/ML IV BOLUS
INTRAVENOUS | Status: AC
  Filled 2022-09-23: qty 20

## 2022-09-23 MED ORDER — ROCURONIUM BROMIDE 10 MG/ML (PF) SYRINGE
PREFILLED_SYRINGE | INTRAVENOUS | Status: DC | PRN
  Administered 2022-09-23: 30 mg via INTRAVENOUS

## 2022-09-23 MED ORDER — LIDOCAINE 2% (20 MG/ML) 5 ML SYRINGE
INTRAMUSCULAR | Status: AC
  Filled 2022-09-23: qty 5

## 2022-09-23 MED ORDER — 0.9 % SODIUM CHLORIDE (POUR BTL) OPTIME
TOPICAL | Status: DC | PRN
Start: 2022-09-23 — End: 2022-09-23
  Administered 2022-09-23: 1000 mL

## 2022-09-23 MED ORDER — OXYCODONE HCL 5 MG PO TABS
10.0000 mg | ORAL_TABLET | ORAL | Status: DC | PRN
  Administered 2022-09-26: 15 mg via ORAL
  Administered 2022-09-27: 10 mg via ORAL
  Administered 2022-09-28: 15 mg via ORAL
  Filled 2022-09-23 (×2): qty 3

## 2022-09-23 MED ORDER — HYDROMORPHONE HCL 1 MG/ML IJ SOLN
0.5000 mg | INTRAMUSCULAR | Status: DC | PRN
  Administered 2022-09-23 – 2022-09-27 (×10): 1 mg via INTRAVENOUS
  Filled 2022-09-23 (×10): qty 1

## 2022-09-23 MED ORDER — ACETAMINOPHEN 10 MG/ML IV SOLN
1000.0000 mg | Freq: Once | INTRAVENOUS | Status: DC | PRN
  Administered 2022-09-23: 1000 mg via INTRAVENOUS

## 2022-09-23 MED ORDER — CHLORHEXIDINE GLUCONATE 0.12 % MT SOLN: 15.0000 mL | Freq: Once | OROMUCOSAL | Status: AC

## 2022-09-23 MED ORDER — PHENYLEPHRINE 80 MCG/ML (10ML) SYRINGE FOR IV PUSH (FOR BLOOD PRESSURE SUPPORT)
PREFILLED_SYRINGE | INTRAVENOUS | Status: AC
  Filled 2022-09-23: qty 10

## 2022-09-23 MED ORDER — CEFAZOLIN SODIUM-DEXTROSE 2-4 GM/100ML-% IV SOLN
2.0000 g | Freq: Four times a day (QID) | INTRAVENOUS | Status: AC
  Administered 2022-09-23 – 2022-09-24 (×3): 2 g via INTRAVENOUS
  Filled 2022-09-23 (×3): qty 100

## 2022-09-23 MED ORDER — TRANEXAMIC ACID 1000 MG/10ML IV SOLN
INTRAVENOUS | Status: DC | PRN
  Administered 2022-09-23: 2000 mg via TOPICAL

## 2022-09-23 MED ORDER — CEFAZOLIN SODIUM-DEXTROSE 2-4 GM/100ML-% IV SOLN
2.0000 g | INTRAVENOUS | Status: AC
  Administered 2022-09-23: 2 g via INTRAVENOUS

## 2022-09-23 MED ORDER — MENTHOL 3 MG MT LOZG: 1.0000 | LOZENGE | OROMUCOSAL | Status: DC | PRN

## 2022-09-23 MED ORDER — POLYETHYLENE GLYCOL 3350 17 G PO PACK: 17.0000 g | PACK | Freq: Every day | ORAL | Status: DC | PRN

## 2022-09-23 MED ORDER — SODIUM CHLORIDE 0.9 % IV SOLN: INTRAVENOUS | Status: DC

## 2022-09-23 MED ORDER — INSULIN ASPART 100 UNIT/ML IJ SOLN: 0.0000 [IU] | INTRAMUSCULAR | Status: DC | PRN

## 2022-09-23 MED ORDER — DEXAMETHASONE SODIUM PHOSPHATE 10 MG/ML IJ SOLN
INTRAMUSCULAR | Status: AC
  Filled 2022-09-23: qty 1

## 2022-09-23 MED ORDER — SORBITOL 70 % SOLN: 30.0000 mL | Freq: Every day | Status: DC | PRN

## 2022-09-23 MED ORDER — ALUM & MAG HYDROXIDE-SIMETH 200-200-20 MG/5ML PO SUSP: 30.0000 mL | ORAL | Status: DC | PRN

## 2022-09-23 MED ORDER — LIDOCAINE 2% (20 MG/ML) 5 ML SYRINGE
INTRAMUSCULAR | Status: DC | PRN
  Administered 2022-09-23: 60 mg via INTRAVENOUS

## 2022-09-23 MED ORDER — TRANEXAMIC ACID-NACL 1000-0.7 MG/100ML-% IV SOLN
1000.0000 mg | Freq: Once | INTRAVENOUS | Status: AC
  Administered 2022-09-23: 1000 mg via INTRAVENOUS
  Filled 2022-09-23: qty 100

## 2022-09-23 MED ORDER — TRANEXAMIC ACID-NACL 1000-0.7 MG/100ML-% IV SOLN: 1000.0000 mg | INTRAVENOUS | Status: DC

## 2022-09-23 MED ORDER — POVIDONE-IODINE 10 % EX SWAB
2.0000 | Freq: Once | CUTANEOUS | Status: AC
  Administered 2022-09-23: 2 via TOPICAL

## 2022-09-23 MED ORDER — CHLORHEXIDINE GLUCONATE 4 % EX SOLN: 60.0000 mL | Freq: Once | CUTANEOUS | Status: DC

## 2022-09-23 MED ORDER — ACETAMINOPHEN 500 MG PO TABS
1000.0000 mg | ORAL_TABLET | Freq: Four times a day (QID) | ORAL | Status: AC
  Administered 2022-09-23 – 2022-09-24 (×4): 1000 mg via ORAL
  Filled 2022-09-23 (×4): qty 2

## 2022-09-23 MED ORDER — AMISULPRIDE (ANTIEMETIC) 5 MG/2ML IV SOLN: 10.0000 mg | Freq: Once | INTRAVENOUS | Status: DC | PRN

## 2022-09-23 MED ORDER — ONDANSETRON HCL 4 MG PO TABS: 4.0000 mg | ORAL_TABLET | Freq: Four times a day (QID) | ORAL | Status: DC | PRN

## 2022-09-23 MED ORDER — PHENYLEPHRINE 80 MCG/ML (10ML) SYRINGE FOR IV PUSH (FOR BLOOD PRESSURE SUPPORT)
PREFILLED_SYRINGE | INTRAVENOUS | Status: DC | PRN
  Administered 2022-09-23 (×2): 80 ug via INTRAVENOUS
  Administered 2022-09-23: 160 ug via INTRAVENOUS

## 2022-09-23 MED ORDER — PROPOFOL 10 MG/ML IV BOLUS
INTRAVENOUS | Status: DC | PRN
Start: 2022-09-23 — End: 2022-09-23
  Administered 2022-09-23: 100 mg via INTRAVENOUS
  Administered 2022-09-23: 30 mg via INTRAVENOUS

## 2022-09-23 MED ORDER — ONDANSETRON HCL 4 MG/2ML IJ SOLN
INTRAMUSCULAR | Status: DC | PRN
  Administered 2022-09-23: 4 mg via INTRAVENOUS

## 2022-09-23 MED ORDER — EPHEDRINE SULFATE-NACL 50-0.9 MG/10ML-% IV SOSY
PREFILLED_SYRINGE | INTRAVENOUS | Status: DC | PRN
  Administered 2022-09-23: 5 mg via INTRAVENOUS

## 2022-09-23 MED ORDER — METHOCARBAMOL 1000 MG/10ML IJ SOLN: 500.0000 mg | Freq: Four times a day (QID) | INTRAVENOUS | Status: DC | PRN

## 2022-09-23 MED ORDER — METHOCARBAMOL 500 MG PO TABS
500.0000 mg | ORAL_TABLET | Freq: Four times a day (QID) | ORAL | Status: DC | PRN
  Administered 2022-09-24 – 2022-09-28 (×10): 500 mg via ORAL
  Filled 2022-09-23 (×10): qty 1

## 2022-09-23 MED ORDER — MAGNESIUM CITRATE PO SOLN: 1.0000 | Freq: Once | ORAL | Status: DC | PRN

## 2022-09-23 MED ORDER — DOCUSATE SODIUM 100 MG PO CAPS
100.0000 mg | ORAL_CAPSULE | Freq: Two times a day (BID) | ORAL | Status: DC
  Administered 2022-09-23 – 2022-09-28 (×10): 100 mg via ORAL
  Filled 2022-09-23 (×9): qty 1

## 2022-09-23 MED ORDER — DEXAMETHASONE SODIUM PHOSPHATE 10 MG/ML IJ SOLN
INTRAMUSCULAR | Status: DC | PRN
  Administered 2022-09-23: 4 mg via INTRAVENOUS

## 2022-09-23 MED ORDER — LACTATED RINGERS IV SOLN: INTRAVENOUS | Status: DC

## 2022-09-23 MED ORDER — PHENOL 1.4 % MT LIQD: 1.0000 | OROMUCOSAL | Status: DC | PRN

## 2022-09-23 MED ORDER — PHENYLEPHRINE HCL-NACL 20-0.9 MG/250ML-% IV SOLN
INTRAVENOUS | Status: DC | PRN
  Administered 2022-09-23: 40 ug/min via INTRAVENOUS

## 2022-09-23 MED ORDER — EPHEDRINE 5 MG/ML INJ
INTRAVENOUS | Status: AC
  Filled 2022-09-23: qty 5

## 2022-09-23 MED ORDER — FENTANYL CITRATE (PF) 100 MCG/2ML IJ SOLN
INTRAMUSCULAR | Status: AC
  Filled 2022-09-23: qty 2

## 2022-09-23 MED ORDER — FENTANYL CITRATE (PF) 100 MCG/2ML IJ SOLN
25.0000 ug | INTRAMUSCULAR | Status: DC | PRN
  Administered 2022-09-23 (×3): 25 ug via INTRAVENOUS

## 2022-09-23 MED ORDER — FENTANYL CITRATE (PF) 250 MCG/5ML IJ SOLN
INTRAMUSCULAR | Status: AC
  Filled 2022-09-23: qty 5

## 2022-09-23 MED ORDER — ORAL CARE MOUTH RINSE: 15.0000 mL | Freq: Once | OROMUCOSAL | Status: AC

## 2022-09-23 MED ORDER — ACETAMINOPHEN 10 MG/ML IV SOLN
INTRAVENOUS | Status: AC
  Filled 2022-09-23: qty 100

## 2022-09-23 MED ORDER — ONDANSETRON HCL 4 MG/2ML IJ SOLN: 4.0000 mg | Freq: Once | INTRAMUSCULAR | Status: DC | PRN

## 2022-09-23 MED ORDER — ONDANSETRON HCL 4 MG/2ML IJ SOLN
INTRAMUSCULAR | Status: AC
  Filled 2022-09-23: qty 2

## 2022-09-23 SURGICAL SUPPLY — 62 items
BAG COUNTER SPONGE SURGICOUNT (BAG) ×1 IMPLANT
BAG SPNG CNTER NS LX DISP (BAG) ×1
BIT DRILL INTERTAN LAG SCREW (BIT) IMPLANT
BIT DRILL LONG 4.0 (BIT) IMPLANT
BIT DRILL SHORT 4.0 (BIT) IMPLANT
BNDG CMPR 5X6 CHSV STRCH STRL (GAUZE/BANDAGES/DRESSINGS)
BNDG COHESIVE 4X5 TAN STRL (GAUZE/BANDAGES/DRESSINGS) ×1 IMPLANT
BNDG COHESIVE 6X5 TAN ST LF (GAUZE/BANDAGES/DRESSINGS) IMPLANT
BNDG GAUZE DERMACEA FLUFF 4 (GAUZE/BANDAGES/DRESSINGS) ×1 IMPLANT
BNDG GZE DERMACEA 4 6PLY (GAUZE/BANDAGES/DRESSINGS)
COVER PERINEAL POST (MISCELLANEOUS) ×1 IMPLANT
COVER SURGICAL LIGHT HANDLE (MISCELLANEOUS) ×1 IMPLANT
DRAPE C-ARMOR (DRAPES) ×1 IMPLANT
DRAPE STERI IOBAN 125X83 (DRAPES) ×1 IMPLANT
DRESSING MEPILEX FLEX 4X4 (GAUZE/BANDAGES/DRESSINGS) ×1 IMPLANT
DRILL BIT LONG 4.0 (BIT) ×1
DRILL BIT SHORT 4.0 (BIT) ×3
DRSG MEPILEX FLEX 4X4 (GAUZE/BANDAGES/DRESSINGS)
DRSG MEPILEX POST OP 4X8 (GAUZE/BANDAGES/DRESSINGS) ×1 IMPLANT
DRSG TEGADERM 2-3/8X2-3/4 SM (GAUZE/BANDAGES/DRESSINGS) IMPLANT
DURAPREP 26ML APPLICATOR (WOUND CARE) ×1 IMPLANT
ELECT REM PT RETURN 9FT ADLT (ELECTROSURGICAL) ×1
ELECTRODE REM PT RTRN 9FT ADLT (ELECTROSURGICAL) ×1 IMPLANT
GAUZE PAD ABD 8X10 STRL (GAUZE/BANDAGES/DRESSINGS) ×2 IMPLANT
GLOVE BIOGEL PI IND STRL 7.0 (GLOVE) ×2 IMPLANT
GLOVE BIOGEL PI IND STRL 7.5 (GLOVE) ×1 IMPLANT
GLOVE ECLIPSE 7.0 STRL STRAW (GLOVE) ×1 IMPLANT
GLOVE SKINSENSE STRL SZ7.5 (GLOVE) ×2 IMPLANT
GLOVE SURG SYN 7.5 E (GLOVE) ×2 IMPLANT
GLOVE SURG SYN 7.5 PF PI (GLOVE) ×2 IMPLANT
GLOVE SURG UNDER POLY LF SZ7 (GLOVE) ×19 IMPLANT
GLOVE SURG UNDER POLY LF SZ7.5 (GLOVE) ×4 IMPLANT
GOWN STRL SURGICAL XL XLNG (GOWN DISPOSABLE) ×1 IMPLANT
GUIDE PIN 3.2X343 (PIN) ×2
GUIDE PIN 3.2X343MM (PIN) ×2
GUIDE ROD 3.0 (MISCELLANEOUS) ×1
KIT BASIN OR (CUSTOM PROCEDURE TRAY) ×1 IMPLANT
KIT TURNOVER KIT B (KITS) ×1 IMPLANT
MANIFOLD NEPTUNE II (INSTRUMENTS) ×1 IMPLANT
NAIL TRIGEN 10MMX36CM-125 RT (Nail) IMPLANT
NAIL TRIGEN INTERTAN 10X18CM (Nail) IMPLANT
NS IRRIG 1000ML POUR BTL (IV SOLUTION) ×1 IMPLANT
PACK GENERAL/GYN (CUSTOM PROCEDURE TRAY) ×1 IMPLANT
PAD ARMBOARD 7.5X6 YLW CONV (MISCELLANEOUS) ×2 IMPLANT
PAD CAST 4YDX4 CTTN HI CHSV (CAST SUPPLIES) ×2 IMPLANT
PADDING CAST COTTON 4X4 STRL (CAST SUPPLIES)
PIN GUIDE 3.2X343MM (PIN) IMPLANT
ROD GUIDE 3.0 (MISCELLANEOUS) IMPLANT
SCREW LAG COMPR KIT 95/90 (Screw) IMPLANT
SCREW TRIGEN LOW PROF 5.0X35 (Screw) IMPLANT
SCREW TRIGEN LOW PROF 5.0X42.5 (Screw) IMPLANT
STAPLER VISISTAT 35W (STAPLE) ×1 IMPLANT
SUT VIC AB 0 CT1 27 (SUTURE) ×1
SUT VIC AB 0 CT1 27XBRD ANBCTR (SUTURE) ×1 IMPLANT
SUT VIC AB 1 CT1 27 (SUTURE) ×1
SUT VIC AB 1 CT1 27XBRD ANTBC (SUTURE) IMPLANT
SUT VIC AB 2-0 CT1 (SUTURE) IMPLANT
SUT VIC AB 2-0 CT1 27 (SUTURE) ×1
SUT VIC AB 2-0 CT1 TAPERPNT 27 (SUTURE) ×1 IMPLANT
TOWEL GREEN STERILE (TOWEL DISPOSABLE) ×1 IMPLANT
TOWEL GREEN STERILE FF (TOWEL DISPOSABLE) ×1 IMPLANT
WATER STERILE IRR 1000ML POUR (IV SOLUTION) ×1 IMPLANT

## 2022-09-23 NOTE — Progress Notes (Signed)
PROGRESS NOTE    Rhonda Fields  ZOX:[id number] DOB: 11/21/1950 DOA: 09/22/2022 PCP: Donita Brooks, MD    Brief Narrative:  Rhonda Fields is a 72 y.o. female with medical history significant of diabetes, GERD, hypertension, depression, anxiety, hyperlipidemia presenting after multiple falls at home.  CT of the hip showed right femur neck fracture.  Orthopedist consulted and have seen the patient with plan for surgery.     Assessment and Plan: Right femur neck fracture Falls > Multiple falls in the last week.  Found to have fractures of the right femur neck on imaging. > Orthopedic consulted and plan for surgery 9/4 - Continue with as needed pain medications with Norco for moderate to severe pain and Dilaudid for severe breakthrough pain - PT/OT after and most likely will need SNF   AKI > Creatinine elevated to 1.5 from baseline 0.9.  Did receive a liter of fluids in the ED. - no labs this AM yet   Leukocytosis > Noted to have leukocytosis 17.3.  This could be reactive however with patient's recent falls we will work to rule out infection. -no labs this AM yet   Diabetes - SSI   GERD - Continue PPI   Depression/Anxiety - Continue fluoxetine   Hyperlipidemia - Continue home atorvastatin   Hypertension - Continue losartan   DVT prophylaxis: SCDs Start: 09/22/22 1556    Code Status: Full Code   Disposition Plan:  Level of care: Telemetry Surgical Status is: Observation The patient will require care spanning > 2 midnights and should be moved to inpatient     Consultants:  ortho   Subjective: C/o hip pain  Objective: Vitals:   09/22/22 1721 09/22/22 1932 09/22/22 2342 09/23/22 0457  BP: 135/63 (!) 124/58 123/61 138/73  Pulse: 95 (!) 105 (!) 103 90  Resp: 17 16 16 16   Temp: 98.2 F (36.8 C) 98.5 F (36.9 C)    TempSrc: Oral Oral    SpO2: 97% 96% 96% 94%  Weight:      Height:        Intake/Output Summary (Last 24 hours) at  09/23/2022 0759 Last data filed at 09/23/2022 0101 Gross per 24 hour  Intake 240 ml  Output 500 ml  Net -260 ml   Filed Weights   09/22/22 1033  Weight: 65.4 kg    Examination:   General: Appearance:    Elderly female in no acute distress     Lungs:     respirations unlabored  Heart:    Normal heart rate. Normal rhythm. No murmurs, rubs, or gallops.    MS:   All extremities are intact.    Neurologic:   Awake, alert       Data Reviewed: I have personally reviewed following labs and imaging studies  CBC: Recent Labs  Lab 09/22/22 1101  WBC 17.3*  NEUTROABS 15.7*  HGB 10.8*  HCT 33.3*  MCV 89.5  PLT 346   Basic Metabolic Panel: Recent Labs  Lab 09/22/22 1101  NA 132*  K 4.8  CL 100  CO2 12*  GLUCOSE 105*  BUN 39*  CREATININE 1.50*  CALCIUM 9.0   GFR: Estimated Creatinine Clearance: 29.7 mL/min (A) (by C-G formula based on SCr of 1.5 mg/dL (H)). Liver Function Tests: No results for input(s): "AST", "ALT", "ALKPHOS", "BILITOT", "PROT", "ALBUMIN" in the last 168 hours. No results for input(s): "LIPASE", "AMYLASE" in the last 168 hours. No results for input(s): "AMMONIA" in the last 168 hours. Coagulation Profile: No  results for input(s): "INR", "PROTIME" in the last 168 hours. Cardiac Enzymes: Recent Labs  Lab 09/22/22 1101  CKTOTAL 297*   BNP (last 3 results) No results for input(s): "PROBNP" in the last 8760 hours. HbA1C: No results for input(s): "HGBA1C" in the last 72 hours. CBG: Recent Labs  Lab 09/22/22 1724 09/22/22 2140  GLUCAP 101* 100*   Lipid Profile: No results for input(s): "CHOL", "HDL", "LDLCALC", "TRIG", "CHOLHDL", "LDLDIRECT" in the last 72 hours. Thyroid Function Tests: No results for input(s): "TSH", "T4TOTAL", "FREET4", "T3FREE", "THYROIDAB" in the last 72 hours. Anemia Panel: No results for input(s): "VITAMINB12", "FOLATE", "FERRITIN", "TIBC", "IRON", "RETICCTPCT" in the last 72 hours. Sepsis Labs: Recent Labs  Lab  09/22/22 1635  PROCALCITON 2.72    No results found for this or any previous visit (from the past 240 hour(s)).       Radiology Studies: CT HIP RIGHT WO CONTRAST  Result Date: 09/22/2022 CLINICAL DATA:  Right hip fracture after a fall. EXAM: CT OF THE RIGHT HIP WITHOUT CONTRAST TECHNIQUE: Multidetector CT imaging of the right hip was performed according to the standard protocol. Multiplanar CT image reconstructions were also generated. RADIATION DOSE REDUCTION: This exam was performed according to the departmental dose-optimization program which includes automated exposure control, adjustment of the mA and/or kV according to patient size and/or use of iterative reconstruction technique. COMPARISON:  Right hip x-rays from same day. CT abdomen pelvis dated January 01, 2022. FINDINGS: Bones/Joint/Cartilage Acute impacted basicervical fracture of the right femoral neck with significant varus angulation. Additional comminuted fracture of the superior greater trochanter. The lesser trochanter is intact. No dislocation. Mild diffuse right hip joint space narrowing with small marginal osteophytes. No joint effusion. Ligaments Ligaments are suboptimally evaluated by CT. Muscles and Tendons Grossly intact.  No significant muscle atrophy. Soft tissue Mild lateral hip soft tissue swelling. No fluid collection or hematoma. No soft tissue mass. IMPRESSION: 1. Acute basicervical fracture of the right femoral neck. Additional comminuted fracture of the superior greater trochanter. Electronically Signed   By: Obie Dredge M.D.   On: 09/22/2022 14:45   CT Head Wo Contrast  Result Date: 09/22/2022 CLINICAL DATA:  Head trauma, minor (Age >= 65y); Neck trauma (Age >= 65y) EXAM: CT HEAD WITHOUT CONTRAST CT CERVICAL SPINE WITHOUT CONTRAST TECHNIQUE: Multidetector CT imaging of the head and cervical spine was performed following the standard protocol without intravenous contrast. Multiplanar CT image reconstructions of  the cervical spine were also generated. RADIATION DOSE REDUCTION: This exam was performed according to the departmental dose-optimization program which includes automated exposure control, adjustment of the mA and/or kV according to patient size and/or use of iterative reconstruction technique. COMPARISON:  None Available. FINDINGS: CT HEAD FINDINGS Brain: No evidence of acute infarction, hemorrhage, hydrocephalus, extra-axial collection or mass lesion/mass effect. Sequela of mild chronic microvascular ischemic change. Vascular: No hyperdense vessel or unexpected calcification. Skull: Normal. Negative for fracture or focal lesion. Sinuses/Orbits: No middle ear or mastoid effusion. Paranasal sinuses are clear. Mucosal thickening bilateral sphenoid sinuses. Orbits are unremarkable. Other: None. CT CERVICAL SPINE FINDINGS Alignment: Grade 1 anterolisthesis of C4 on C5 and C5 on C6. Skull base and vertebrae: No acute fracture. No primary bone lesion or focal pathologic process. Soft tissues and spinal canal: No prevertebral fluid or swelling. No visible canal hematoma. Disc levels:  No evidence of high-grade spinal canal stenosis. Upper chest: Negative. Other: 1.1 cm. This is not meet size criteria for further workup. Right thyroid nodule IMPRESSION: 1. No acute intracranial  abnormality. 2. No acute fracture or traumatic malalignment of the cervical spine. Electronically Signed   By: Lorenza Cambridge M.D.   On: 09/22/2022 14:16   CT Cervical Spine Wo Contrast  Result Date: 09/22/2022 CLINICAL DATA:  Head trauma, minor (Age >= 65y); Neck trauma (Age >= 65y) EXAM: CT HEAD WITHOUT CONTRAST CT CERVICAL SPINE WITHOUT CONTRAST TECHNIQUE: Multidetector CT imaging of the head and cervical spine was performed following the standard protocol without intravenous contrast. Multiplanar CT image reconstructions of the cervical spine were also generated. RADIATION DOSE REDUCTION: This exam was performed according to the departmental  dose-optimization program which includes automated exposure control, adjustment of the mA and/or kV according to patient size and/or use of iterative reconstruction technique. COMPARISON:  None Available. FINDINGS: CT HEAD FINDINGS Brain: No evidence of acute infarction, hemorrhage, hydrocephalus, extra-axial collection or mass lesion/mass effect. Sequela of mild chronic microvascular ischemic change. Vascular: No hyperdense vessel or unexpected calcification. Skull: Normal. Negative for fracture or focal lesion. Sinuses/Orbits: No middle ear or mastoid effusion. Paranasal sinuses are clear. Mucosal thickening bilateral sphenoid sinuses. Orbits are unremarkable. Other: None. CT CERVICAL SPINE FINDINGS Alignment: Grade 1 anterolisthesis of C4 on C5 and C5 on C6. Skull base and vertebrae: No acute fracture. No primary bone lesion or focal pathologic process. Soft tissues and spinal canal: No prevertebral fluid or swelling. No visible canal hematoma. Disc levels:  No evidence of high-grade spinal canal stenosis. Upper chest: Negative. Other: 1.1 cm. This is not meet size criteria for further workup. Right thyroid nodule IMPRESSION: 1. No acute intracranial abnormality. 2. No acute fracture or traumatic malalignment of the cervical spine. Electronically Signed   By: Lorenza Cambridge M.D.   On: 09/22/2022 14:16   DG Ankle Complete Right  Result Date: 09/22/2022 CLINICAL DATA:  Multiple falls, pain. EXAM: RIGHT ANKLE - COMPLETE 3+ VIEW COMPARISON:  None Available. FINDINGS: No acute ankle fracture. Normal ankle alignment, no dislocation. The ankle mortise is preserved. Talar dome is intact. 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 There is a small plantar calcaneal spur. No ankle joint effusion. Fifth metatarsal shaft fracture is only included on the lateral view, suspect this is chronic with surrounding callus but incompletely characterized. IMPRESSION: 1. No acute ankle  fracture. 2. Fifth metatarsal shaft fracture is only included on the lateral view, suspect this is chronic with surrounding callus but incompletely characterized. Recommend dedicated foot radiograph. Electronically Signed   By: Narda Rutherford M.D.   On: 09/22/2022 13:40   DG Hip Unilat W or Wo Pelvis 2-3 Views Right  Result Date: 09/22/2022 CLINICAL DATA:  Right leg pain, fall. EXAM: DG HIP (WITH OR WITHOUT PELVIS) 2-3V RIGHT COMPARISON:  None Available. FINDINGS: Right proximal femur fracture is likely transcervical femoral neck fracture, although assessed on subsequent CT. This is at the femoral neck intertrochanteric junction. Mild apex lateral angulation and proximal foreshortening. The femoral head remains seated. The bony pelvis is intact, no pubic rami fractures. No pubic symphyseal or sacroiliac diastasis. IMPRESSION: Right proximal femur fracture is likely transcervical femoral neck fracture. This has been further characterized on subsequent CT. Electronically Signed   By: Narda Rutherford M.D.   On: 09/22/2022 13:38   DG Chest 2 View  Result Date: 09/22/2022 CLINICAL DATA:  Multiple falls, pain. EXAM: CHEST - 2 VIEW COMPARISON:  Radiograph 02/16/2012 FINDINGS: Lung volumes are low. The heart is mildly enlarged. Mediastinal contours are normal. No pneumothorax or pleural effusion. Small retrocardiac hiatal  hernia. No focal airspace disease. Remote left anterior rib fractures. Compression fracture of the lower thoracic vertebra, age indeterminate. IMPRESSION: 1. Low lung volumes.  No evidence of intrathoracic injury. 2. Compression fracture of a lower thoracic vertebra, age indeterminate. Recommend correlation for acute thoracic back pain. 3. Mild cardiomegaly. Small retrocardiac hiatal hernia. Electronically Signed   By: Narda Rutherford M.D.   On: 09/22/2022 13:37   DG Shoulder Left  Result Date: 09/22/2022 CLINICAL DATA:  Multiple falls, pain. EXAM: LEFT SHOULDER - 2+ VIEW COMPARISON:  None  Available. FINDINGS: There is no evidence of fracture or dislocation. There is no evidence of arthropathy or other focal bone abnormality. Soft tissues are unremarkable. IMPRESSION: No fracture or dislocation of the left shoulder. Electronically Signed   By: Narda Rutherford M.D.   On: 09/22/2022 13:34        Scheduled Meds:  atorvastatin  40 mg Oral Daily   diclofenac Sodium  2 g Topical QID   FLUoxetine  20 mg Oral Daily   insulin aspart  0-9 Units Subcutaneous TID WC   losartan  25 mg Oral Daily   oxybutynin  10 mg Oral BID   pantoprazole  40 mg Oral BID   Continuous Infusions:   LOS: 0 days    Time spent: 45 minutes spent on chart review, discussion with nursing staff, consultants, updating family and interview/physical exam; more than 50% of that time was spent in counseling and/or coordination of care.    Joseph Art, DO [facility] Available via Epic secure chat 7am-7pm After these hours, please refer to coverage provider listed on [email or web address] 09/23/2022, 7:59 AM

## 2022-09-23 NOTE — Progress Notes (Signed)
OT Cancellation Note  Patient Details Name: MAGALINE KOSIOREK MRN: [id number] DOB: 05-23-1950   Cancelled Treatment:    Reason Eval/Treat Not Completed: Other (comment) (scheduled for LMN today. Will follow up tomorrow.)  [facility] 09/23/2022, 6:32 AM Luisa Dago, OT/L   Acute OT Clinical Specialist Acute Rehabilitation Services Pager [phone] Office [phone]

## 2022-09-23 NOTE — Discharge Instructions (Signed)
° ° °  1. Change dressings as needed °2. May shower but keep incisions covered and dry °3. Take lovenox to prevent blood clots °4. Take stool softeners as needed °5. Take pain meds as needed ° °

## 2022-09-23 NOTE — Anesthesia Preprocedure Evaluation (Addendum)
Anesthesia Evaluation  Patient identified by MRN, date of birth, ID band Patient awake    Reviewed: Allergy & Precautions, NPO status , Patient's Chart, lab work & pertinent test results  Airway Mallampati: II  TM Distance: >3 FB Neck ROM: Full    Dental  (+) Edentulous Upper, Edentulous Lower   Pulmonary Current Smoker and Patient abstained from smoking.   Pulmonary exam normal        Cardiovascular hypertension, Pt. on medications Normal cardiovascular exam     Neuro/Psych  Headaches PSYCHIATRIC DISORDERS Anxiety Depression       GI/Hepatic Neg liver ROS,GERD  Medicated and Controlled,,  Endo/Other  diabetes, Oral Hypoglycemic Agents    Renal/GU Renal disease     Musculoskeletal negative musculoskeletal ROS (+)    Abdominal   Peds  Hematology  (+) Blood dyscrasia, anemia   Anesthesia Other Findings hip fracture  Reproductive/Obstetrics                             Anesthesia Physical Anesthesia Plan  ASA: 3  Anesthesia Plan: General   Post-op Pain Management:    Induction: Intravenous  PONV Risk Score and Plan: 2 and Ondansetron, Dexamethasone, Treatment may vary due to age or medical condition and Midazolam  Airway Management Planned: Oral ETT  Additional Equipment:   Intra-op Plan:   Post-operative Plan: Extubation in OR  Informed Consent: I have reviewed the patients History and Physical, chart, labs and discussed the procedure including the risks, benefits and alternatives for the proposed anesthesia with the patient or authorized representative who has indicated his/her understanding and acceptance.     Dental advisory given  Plan Discussed with: CRNA  Anesthesia Plan Comments:        Anesthesia Quick Evaluation

## 2022-09-23 NOTE — Op Note (Signed)
Date of Surgery: 09/23/2022  INDICATIONS: Rhonda Fields is a 72 y.o.-year-old female who sustained a right basicervical intertrochanteric fracture. The risks and benefits of the procedure discussed with the patient prior to the procedure and all questions were answered; consent was obtained.  PREOPERATIVE DIAGNOSIS: right basicervical intertrochanteric fracture   POSTOPERATIVE DIAGNOSIS:  Right basicervical intertrochanteric fracture Intraoperative right femoral shaft fracture   PROCEDURE:  Open treatment of right intertrochanteric fracture with intramedullary implant. Open treatment of intraoperative femoral shaft fracture intramedullary implant  SURGEON: N. Glee Arvin, M.D.   ASSIST: Oneal Grout, [place]  ANESTHESIA: general   IV FLUIDS AND URINE: See anesthesia record   ESTIMATED BLOOD LOSS: 150 cc  IMPLANTS:  [employer]hew InterTAN 10 x 360 mm, 95/90 lag screws, 2 distal interlocking screws [employer]hew InterTAN 10 x 180 mm (explanted)  DRAINS: None.   COMPLICATIONS: see description of procedure.   DESCRIPTION OF PROCEDURE: The patient was brought to the operating room and placed supine on the operating table. The patient's leg had been signed prior to the procedure. The patient had the anesthesia placed by the anesthesiologist. The prep verification and incision time-outs were performed to confirm that this was the correct patient, site, side and location. The patient had an SCD on the opposite lower extremity. The patient did receive antibiotics prior to the incision and was re-dosed during the procedure as needed at indicated intervals. The patient was positioned on the fracture table with the table in traction and internal rotation to reduce the hip. The well leg was placed in a scissor position and all bony prominences were well-padded. The patient had the lower extremity prepped and draped in the standard surgical fashion. The incision was made 4 finger  breadths superior to the greater trochanter. A guide pin was inserted into the tip of the greater trochanter under fluoroscopic guidance. An opening reamer was used to gain access to the femoral canal.  I then inserted the 10 x 180 mm nail down into the femoral canal.  I was met with a little bit of resistance when I was passing the nail  just past the subtrochanteric region.  I checked on fluoroscopy that the nail was centered down the canal and I gently malleted the nail down to the appropriate depth.  At that point I noticed a nondisplaced lucency in the medial cortex just distal to the lesser trochanter.  When we looked more distal with the fluoroscope we found that the nail had created a nondisplaced spiral femoral shaft fracture just distal to the tip of the nail.  When this was recognized I then made the decision to convert to a long nail.  A guidewire was then inserted down the jig and the nail into the femoral canal down to the physeal scar.  The nail and the jig were then back slapped out.  Sequential reaming was then performed up to 11.5 mm with adequate chatter.  This did not displace the femoral shaft fracture.  The length of the nail was then measured and then we inserted a 10 x 360 mm nail down the femoral canal to the proper depth by checking on fluoroscopy. The appropriate version of insertion for the lag screw was found under fluoroscopy. A pin was inserted up the femoral neck through the jig. Then, a second antirotation pin was inserted inferior to the first pin. The length of the lag screw was then measured. The lag screw was inserted as near to center-center in  the head as possible. The antirotation pin was then taken out and an interdigitating compression screw was placed in its place. The leg was taken out of traction, then the interdigitating compression screw was used to compress across the fracture. Compression was visualized on serial xrays.  Two distal interlocking screws were placed  using the perfect circle technique.  The wounds were copiously irrigated with saline and the subcutaneous layer closed with 2.0 vicryl and the skin was reapproximated with staples. The wounds were cleaned and dried a final time and a sterile dressing was placed. The hip was taken through a range of motion at the end of the case under fluoroscopic imaging to visualize the approach-withdraw phenomenon and confirm implant length in the head. The patient was then awakened from anesthesia and taken to the recovery room in stable condition. All counts were correct at the end of the case.   Rhonda Fields was necessary for opening, closing, retracting, limb positioning and overall facilitation and completion of the surgery.  POSTOPERATIVE PLAN: The patient will be 25% partial weight bearing and will return in 2 weeks for staple removal and the patient will receive DVT prophylaxis based on other medications, activity level, and risk ratio of bleeding to thrombosis.   Rhonda Reel, MD [facility] 6:17 PM

## 2022-09-23 NOTE — Anesthesia Procedure Notes (Signed)
Procedure Name: Intubation Date/Time: 09/23/2022 2:29 PM  Performed by: Kayleen Memos, CRNAPre-anesthesia Checklist: Patient identified, Emergency Drugs available, Suction available and Patient being monitored Patient Re-evaluated:Patient Re-evaluated prior to induction Oxygen Delivery Method: Circle System Utilized Preoxygenation: Pre-oxygenation with 100% oxygen Induction Type: IV induction Ventilation: Mask ventilation without difficulty and Oral airway inserted - appropriate to patient size Laryngoscope Size: Mac and 3 Grade View: Grade I Tube type: Oral Tube size: 7.0 mm Number of attempts: 1 Airway Equipment and Method: Stylet and Oral airway Placement Confirmation: ETT inserted through vocal cords under direct vision, positive ETCO2 and breath sounds checked- equal and bilateral Secured at: 21 cm Tube secured with: Tape Dental Injury: Teeth and Oropharynx as per pre-operative assessment

## 2022-09-23 NOTE — Plan of Care (Signed)

## 2022-09-23 NOTE — H&P (Signed)

## 2022-09-23 NOTE — TOC CAGE-AID Note (Signed)
Transition of Care [facility]) - CAGE-AID Screening  Patient Details  Name: Rhonda Fields MRN: [id number] Date of Birth: 1950-01-26  Clinical Narrative:  Patient denies any alcohol or drug use. Does endorse current cigarette smoking. Patient denies need for resources at this time.  CAGE-AID Screening:   Have You Ever Felt You Ought to Cut Down on Your Drinking or Drug Use?: No Have People Annoyed You By Critizing Your Drinking Or Drug Use?: No Have You Felt Bad Or Guilty About Your Drinking Or Drug Use?: No Have You Ever Had a Drink or Used Drugs First Thing In The Morning to Steady Your Nerves or to Get Rid of a Hangover?: No CAGE-AID Score: 0  Substance Abuse Education Offered: No

## 2022-09-23 NOTE — Progress Notes (Signed)
PT Cancellation Note  Patient Details Name: MALYAH SARPY MRN: [id number] DOB: 1950/05/05   Cancelled Treatment:    Reason Eval/Treat Not Completed: (P) Patient not medically ready Pt is for R IM nail sx today. PT will follow back after surgery for Evaluation.  Freja Faro B. Beverely Risen PT, DPT Acute Rehabilitation Services Please use secure chat or  Call Office [phone]    Elon Alas [facility] 09/23/2022, 8:32 AM

## 2022-09-23 NOTE — Transfer of Care (Signed)
Immediate Anesthesia Transfer of Care Note  Patient: Rhonda Fields  Procedure(s) Performed: INTRAMEDULLARY (IM) NAIL INTERTROCHANTERIC (Right: Leg Upper)  Patient Location: PACU  Anesthesia Type:General  Level of Consciousness: awake and drowsy  Airway & Oxygen Therapy: Patient Spontanous Breathing  Post-op Assessment: Report given to RN, Post -op Vital signs reviewed and stable, and Patient moving all extremities  Post vital signs: Reviewed and stable  Last Vitals:  Vitals Value Taken Time  BP 131/67 09/23/22 1608  Temp 36.7 C 09/23/22 1608  Pulse 82 09/23/22 1610  Resp 14 09/23/22 1610  SpO2 99 % 09/23/22 1610  Vitals shown include unfiled device data.  Last Pain:  Vitals:   09/23/22 1313  TempSrc: Oral  PainSc:          Complications: No notable events documented.

## 2022-09-24 ENCOUNTER — Encounter (HOSPITAL_COMMUNITY): Payer: Self-pay | Admitting: Orthopaedic Surgery

## 2022-09-24 DIAGNOSIS — S72141A Displaced intertrochanteric fracture of right femur, initial encounter for closed fracture: Principal | ICD-10-CM

## 2022-09-24 LAB — GLUCOSE, CAPILLARY
Glucose-Capillary: 96 mg/dL (ref 70–99)
Glucose-Capillary: 121 mg/dL — ABNORMAL HIGH (ref 70–99)
Glucose-Capillary: 129 mg/dL — ABNORMAL HIGH (ref 70–99)
Glucose-Capillary: 105 mg/dL — ABNORMAL HIGH (ref 70–99)

## 2022-09-24 NOTE — TOC Initial Note (Signed)
Transition of Care [facility]) - Initial/Assessment Note    Patient Details  Name: Rhonda Fields MRN: [id number] Date of Birth: 1950-11-10  [facility]) CM/SW Contact:    Deatra Robinson, [place] Phone Number: 09/24/2022, 2:36 PM  Clinical Narrative: spoke with pt re PT recommendation for SNF. Pt with no previous SNF stay. Explained SNF placement process and answered questions. Pt with no preferred facility at this time but requests search in [place] and [place]. Will f/u with offers as available. Pt will require auth for SNF.   Dellie Burns, MSW, LCSW [phone] (coverage)                    Expected Discharge Plan: Skilled Nursing Facility Barriers to Discharge: Continued Medical Work up, SNF Pending bed offer, Insurance Authorization   Patient Goals and CMS Choice   CMS Medicare.gov Compare Post Acute Care list provided to:: Patient Choice offered to / list presented to : Patient [facility] ownership interest in [facility].provided to:: Patient    Expected Discharge Plan and Services       Living arrangements for the past 2 months: Skilled Nursing Facility                                      Prior Living Arrangements/Services Living arrangements for the past 2 months: Skilled Nursing Facility Lives with:: Self Patient language and need for interpreter reviewed:: No        Need for Family Participation in Patient Care: Yes (Comment) Care giver support system in place?: No (comment)   Criminal Activity/Legal Involvement Pertinent to Current Situation/Hospitalization: No - Comment as needed  Activities of Daily Living Home Assistive Devices/Equipment: Cane (specify quad or straight), Eyeglasses, Walker (specify type) ADL Screening (condition at time of admission) Patient's cognitive ability adequate to safely complete daily activities?: Yes Is the patient deaf or have difficulty hearing?: Yes Does the patient have  difficulty seeing, even when wearing glasses/contacts?: No Does the patient have difficulty concentrating, remembering, or making decisions?: No Patient able to express need for assistance with ADLs?: Yes Does the patient have difficulty dressing or bathing?: Yes Independently performs ADLs?: Yes (appropriate for developmental age) Does the patient have difficulty walking or climbing stairs?: Yes Weakness of Legs: Right Weakness of Arms/Hands: None  Permission Sought/Granted Permission sought to share information with : Facility Industrial/product designer granted to share information with : Yes, Verbal Permission Granted              Emotional Assessment       Orientation: : Oriented to Self, Oriented to Place, Oriented to  Time, Oriented to Situation Alcohol / Substance Use: Not Applicable Psych Involvement: No (comment)  Admission diagnosis:  Dehydration [E86.0] AKI (acute kidney injury) (HCC) [N17.9] Other fracture of right femur, initial encounter for closed fracture (HCC) [S72.8X1A] Other closed fracture of head or neck of right femur, initial encounter (HCC) [S72.091A] Hip fracture (HCC) [S72.009A] Patient Active Problem List   Diagnosis Date Noted   Hip fracture (HCC) 09/23/2022   Displaced spiral fracture of shaft of right femur, initial encounter for closed fracture (HCC) 09/23/2022   Displaced intertrochanteric fracture of right femur, initial encounter for closed fracture (HCC) 09/22/2022   AKI (acute kidney injury) (HCC) 09/22/2022   Leukocytosis 09/22/2022   GERD (gastroesophageal reflux disease) 09/22/2022   Depression 09/22/2022   Anxiety 09/22/2022  HTN (hypertension) 09/22/2022   HLD (hyperlipidemia) 09/22/2022   DM2 (diabetes mellitus, type 2) (HCC) 01/30/2013   History of total hysterectomy with removal of both tubes and ovaries 04/08/2011   PCP:  Donita Brooks, MD Pharmacy:   [facility] [place], [place] - [address] [place] [address] Phone: [phone] Fax: [phone]     Social Determinants of Health (SDOH) Social History: SDOH Screenings   Food Insecurity: No Food Insecurity (09/22/2022)  Housing: Low Risk  (09/22/2022)  Transportation Needs: No Transportation Needs (09/22/2022)  Utilities: Not At Risk (09/22/2022)  Alcohol Screen: Low Risk  (03/06/2022)  Depression (PHQ2-9): Low Risk  (08/04/2022)  Financial Resource Strain: Low Risk  (03/06/2022)  Physical Activity: Insufficiently Active (03/06/2022)  Social Connections: Socially Isolated (03/06/2022)  Stress: No Stress Concern Present (03/06/2022)  Tobacco Use: High Risk (09/23/2022)   SDOH Interventions:     Readmission Risk Interventions     No data to display

## 2022-09-24 NOTE — Plan of Care (Signed)

## 2022-09-24 NOTE — NC FL2 (Signed)
[place] MEDICAID FL2 LEVEL OF CARE FORM     IDENTIFICATION  Patient Name: Rhonda Fields Birthdate: Apr 05, 1950 Sex: female Admission Date (Current Location): 09/22/2022  [facility] and [place] Number:  Producer, television/film/video and Address:  [facility], [address], [place], [place] [address]      Provider Number: [id number]  Attending Physician Name and Address:  Joseph Art, DO  Relative Name and Phone Number:       Current Level of Care: Hospital Recommended Level of Care: Skilled Nursing Facility Prior Approval Number:    Date Approved/Denied:   PASRR Number: [id number]  Discharge Plan: SNF    Current Diagnoses: Patient Active Problem List   Diagnosis Date Noted   Hip fracture (HCC) 09/23/2022   Displaced spiral fracture of shaft of right femur, initial encounter for closed fracture (HCC) 09/23/2022   Displaced intertrochanteric fracture of right femur, initial encounter for closed fracture (HCC) 09/22/2022   AKI (acute kidney injury) (HCC) 09/22/2022   Leukocytosis 09/22/2022   GERD (gastroesophageal reflux disease) 09/22/2022   Depression 09/22/2022   Anxiety 09/22/2022   HTN (hypertension) 09/22/2022   HLD (hyperlipidemia) 09/22/2022   DM2 (diabetes mellitus, type 2) (HCC) 01/30/2013   History of total hysterectomy with removal of both tubes and ovaries 04/08/2011    Orientation RESPIRATION BLADDER Height & Weight     Situation, Time, Self, Place  Normal Continent Weight: 145 lb (65.8 kg) Height:  5\' 2"  (157.5 cm)  BEHAVIORAL SYMPTOMS/MOOD NEUROLOGICAL BOWEL NUTRITION STATUS      Continent    AMBULATORY STATUS COMMUNICATION OF NEEDS Skin   Extensive Assist   Surgical wounds                       Personal Care Assistance Level of Assistance  Bathing, Feeding, Dressing Bathing Assistance: Limited assistance Feeding assistance: Limited assistance Dressing Assistance: Maximum assistance     Functional  Limitations Info  Sight, Hearing, Speech Sight Info: Adequate Hearing Info: Adequate Speech Info: Adequate    SPECIAL CARE FACTORS FREQUENCY  PT (By licensed PT), OT (By licensed OT)                    Contractures Contractures Info: Not present    Additional Factors Info  Code Status Code Status Info: FULL CODE             Current Medications (09/24/2022):  This is the current hospital active medication list Current Facility-Administered Medications  Medication Dose Route Frequency Provider Last Rate Last Admin   0.9 %  sodium chloride infusion   Intravenous Continuous Tarry Kos, MD   Stopped at 09/24/22 0308   acetaminophen (TYLENOL) tablet 325-650 mg  325-650 mg Oral Q6H PRN Tarry Kos, MD       alum & mag hydroxide-simeth (MAALOX/MYLANTA) 200-200-20 MG/5ML suspension 30 mL  30 mL Oral Q4H PRN Tarry Kos, MD       atorvastatin (LIPITOR) tablet 40 mg  40 mg Oral Daily Tarry Kos, MD   40 mg at 09/24/22 0272   diclofenac Sodium (VOLTAREN) 1 % topical gel 2 g  2 g Topical QID Tarry Kos, MD   2 g at 09/23/22 2131   docusate sodium (COLACE) capsule 100 mg  100 mg Oral BID Tarry Kos, MD   100 mg at 09/24/22 0904   enoxaparin (LOVENOX) injection 40 mg  40 mg Subcutaneous Q24H Xu, Naiping  M, MD   40 mg at 09/24/22 1204   FLUoxetine (PROZAC) capsule 20 mg  20 mg Oral Daily Tarry Kos, MD   20 mg at 09/24/22 1610   HYDROcodone-acetaminophen (NORCO/VICODIN) 5-325 MG per tablet 1 tablet  1 tablet Oral Q6H PRN Tarry Kos, MD       HYDROmorphone (DILAUDID) injection 0.5-1 mg  0.5-1 mg Intravenous Q4H PRN Tarry Kos, MD   1 mg at 09/24/22 0536   insulin aspart (novoLOG) injection 0-9 Units  0-9 Units Subcutaneous TID WC Tarry Kos, MD       losartan (COZAAR) tablet 25 mg  25 mg Oral Daily Tarry Kos, MD   25 mg at 09/23/22 9604   magnesium citrate solution 1 Bottle  1 Bottle Oral Once PRN Tarry Kos, MD       menthol-cetylpyridinium (CEPACOL)  lozenge 3 mg  1 lozenge Oral PRN Tarry Kos, MD       Or   phenol (CHLORASEPTIC) mouth spray 1 spray  1 spray Mouth/Throat PRN Tarry Kos, MD       methocarbamol (ROBAXIN) tablet 500 mg  500 mg Oral Q6H PRN Tarry Kos, MD   500 mg at 09/24/22 0305   Or   methocarbamol (ROBAXIN) 500 mg in dextrose 5 % 50 mL IVPB  500 mg Intravenous Q6H PRN Tarry Kos, MD       ondansetron [facility]) tablet 4 mg  4 mg Oral Q6H PRN Tarry Kos, MD       Or   ondansetron [facility]) injection 4 mg  4 mg Intravenous Q6H PRN Tarry Kos, MD       oxybutynin (DITROPAN) tablet 10 mg  10 mg Oral BID Tarry Kos, MD   10 mg at 09/24/22 5409   oxyCODONE (Oxy IR/ROXICODONE) immediate release tablet 10-15 mg  10-15 mg Oral Q4H PRN Tarry Kos, MD       oxyCODONE (Oxy IR/ROXICODONE) immediate release tablet 5-10 mg  5-10 mg Oral Q4H PRN Tarry Kos, MD       pantoprazole (PROTONIX) EC tablet 40 mg  40 mg Oral BID Tarry Kos, MD   40 mg at 09/24/22 8119   polyethylene glycol (MIRALAX / GLYCOLAX) packet 17 g  17 g Oral Daily PRN Tarry Kos, MD       sorbitol 70 % solution 30 mL  30 mL Oral Daily PRN Tarry Kos, MD         Discharge Medications: Please see discharge summary for a list of discharge medications.  Relevant Imaging Results:  Relevant Lab Results:   Additional Information SS# [id number]  Deatra Robinson, [place]
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
## 2022-09-24 NOTE — Progress Notes (Signed)
PROGRESS NOTE    Rhonda Fields  WJX:[id number] DOB: 1951/01/13 DOA: 09/22/2022 PCP: Donita Brooks, MD    Brief Narrative:  Rhonda Fields is a 72 y.o. female with medical history significant of diabetes, GERD, hypertension, depression, anxiety, hyperlipidemia presenting after multiple falls at home.  CT of the hip showed right femur neck fracture.  Orthopedist consulted -- surgery done 9/4.   Assessment and Plan: Right femur neck fracture Falls > Multiple falls in the last week.  Found to have fractures of the right femur neck on imaging. > Orthopedic consulted: surgery 9/4 - Continue with as needed pain medications with Norco for moderate to severe pain and Dilaudid for severe breakthrough pain - PT/OT after and most likely will need SNF- TO consult   AKI > Creatinine elevated to 1.5 from baseline 0.9.  Did receive a liter of fluids in the ED. - no labs this AM yet   Leukocytosis > Noted to have leukocytosis 17.3.  This could be reactive however with patient's recent falls we will work to rule out infection. Await labs   Diabetes - SSI   GERD - Continue PPI   Depression/Anxiety - Continue fluoxetine   Hyperlipidemia - Continue home atorvastatin   Hypertension - Continue losartan   DVT prophylaxis: enoxaparin (LOVENOX) injection 40 mg Start: 09/24/22 1200 SCDs Start: 09/23/22 1721 Place TED hose Start: 09/23/22 1721 SCDs Start: 09/22/22 1556    Code Status: Full Code   Disposition Plan:  Level of care: Med-Surg Status is: inpt    Consultants:  ortho   Subjective: Breakfast was not good so asking for graham crackers  Objective: Vitals:   09/23/22 1737 09/23/22 1917 09/24/22 0409 09/24/22 0837  BP: 117/67 102/62 103/63 114/63  Pulse: 100 (!) 112 86 99  Resp: 16  18 16   Temp:  98.2 F (36.8 C) 98 F (36.7 C) 98.1 F (36.7 C)  TempSrc:  Oral Oral   SpO2: 97% 93% 96% 92%  Weight:      Height:        Intake/Output Summary  (Last 24 hours) at 09/24/2022 1118 Last data filed at 09/24/2022 0818 Gross per 24 hour  Intake 1041.56 ml  Output 100 ml  Net 941.56 ml   Filed Weights   09/22/22 1033 09/23/22 1214  Weight: 65.4 kg 65.8 kg    Examination:    General: Appearance:     Overweight female in no acute distress     Lungs:     respirations unlabored  Heart:    Normal heart rate.   MS:   All extremities are intact.   Neurologic:   Awake, alert       Data Reviewed: I have personally reviewed following labs and imaging studies  CBC: Recent Labs  Lab 09/22/22 1101 09/23/22 0813 09/23/22 1750  WBC 17.3* 8.7 17.5*  NEUTROABS 15.7*  --   --   HGB 10.8* 9.9* 9.7*  HCT 33.3* 30.3* 29.6*  MCV 89.5 91.0 91.6  PLT 346 288 311   Basic Metabolic Panel: Recent Labs  Lab 09/22/22 1101 09/23/22 0813 09/23/22 1750  NA 132* 136  --   K 4.8 4.3  --   CL 100 106  --   CO2 12* 16*  --   GLUCOSE 105* 110*  --   BUN 39* 25*  --   CREATININE 1.50* 0.89 0.82  CALCIUM 9.0 8.6*  --    GFR: Estimated Creatinine Clearance: 56 mL/min (by C-G formula based on  SCr of 0.82 mg/dL). Liver Function Tests: No results for input(s): "AST", "ALT", "ALKPHOS", "BILITOT", "PROT", "ALBUMIN" in the last 168 hours. No results for input(s): "LIPASE", "AMYLASE" in the last 168 hours. No results for input(s): "AMMONIA" in the last 168 hours. Coagulation Profile: No results for input(s): "INR", "PROTIME" in the last 168 hours. Cardiac Enzymes: Recent Labs  Lab 09/22/22 1101  CKTOTAL 297*   BNP (last 3 results) No results for input(s): "PROBNP" in the last 8760 hours. HbA1C: No results for input(s): "HGBA1C" in the last 72 hours. CBG: Recent Labs  Lab 09/23/22 0926 09/23/22 1129 09/23/22 1608 09/23/22 1948 09/24/22 0834  GLUCAP 111* 99 127* 143* 96   Lipid Profile: No results for input(s): "CHOL", "HDL", "LDLCALC", "TRIG", "CHOLHDL", "LDLDIRECT" in the last 72 hours. Thyroid Function Tests: No results for  input(s): "TSH", "T4TOTAL", "FREET4", "T3FREE", "THYROIDAB" in the last 72 hours. Anemia Panel: No results for input(s): "VITAMINB12", "FOLATE", "FERRITIN", "TIBC", "IRON", "RETICCTPCT" in the last 72 hours. Sepsis Labs: Recent Labs  Lab 09/22/22 1635 09/23/22 0813  PROCALCITON 2.72 1.59    Recent Results (from the past 240 hour(s))  Surgical pcr screen     Status: None   Collection Time: 09/23/22  9:27 AM   Specimen: Nasal Mucosa; Nasal Swab  Result Value Ref Range Status   MRSA, PCR NEGATIVE NEGATIVE Final   Staphylococcus aureus NEGATIVE NEGATIVE Final    Comment: (NOTE) The Xpert SA Assay (FDA approved for NASAL specimens in patients 31 years of age and older), is one component of a comprehensive surveillance program. It is not intended to diagnose infection nor to guide or monitor treatment. Performed at [facility], [address], [place], [place] [address]          Radiology Studies: DG FEMUR PORT, MIN 2 VIEWS RIGHT  Result Date: 09/23/2022 CLINICAL DATA:  [id number] History of open reduction and internal fixation (ORIF) procedure [id number] EXAM: RIGHT FEMUR PORTABLE 2 VIEW COMPARISON:  CT right hip 09/22/2022 FINDINGS: Right femoral neck fracture status post intramedullary nail fixation. No radiographic findings suggest surgical hardware complication. There is no evidence of acute distal fracture or other focal bone lesions. No hip dislocation. Knee is grossly unremarkable. Right hip subcutaneus soft tissue edema and emphysema consistent with surgical changes. Overlying skin staples. Vascular calcifications. IMPRESSION: Right femoral neck fracture status post intramedullary nail fixation. Electronically Signed   By: Tish Frederickson M.D.   On: 09/23/2022 20:47   DG FEMUR, MIN 2 VIEWS RIGHT  Result Date: 09/23/2022 CLINICAL DATA:  Basicervical fracture of the right femoral neck. ORIF. EXAM: OPERATIVE right HIP (WITH PELVIS IF PERFORMED) 5 VIEWS TECHNIQUE: Fluoroscopic  spot image(s) were submitted for interpretation post-operatively. Radiation Exposure Index (as provided by the fluoroscopic device): Dose area product 26.52 mGy COMPARISON:  CT of the right hip 09/22/2022 FINDINGS: Intraoperative images demonstrate placement of a right intramedullary femoral rod. Two distal locking screws are present. Two dynamic screws traverse the femoral neck. The fracture is reduced. IMPRESSION: ORIF of right femoral neck fracture without radiographic evidence for complication. Electronically Signed   By: Marin Roberts M.D.   On: 09/23/2022 19:44   DG C-Arm 1-60 Min-No Report  Result Date: 09/23/2022 Fluoroscopy was utilized by the requesting physician.  No radiographic interpretation.   DG C-Arm 1-60 Min-No Report  Result Date: 09/23/2022 Fluoroscopy was utilized by the requesting physician.  No radiographic interpretation.   CT HIP RIGHT WO CONTRAST  Result Date: 09/22/2022 CLINICAL DATA:  Right hip  fracture after a fall. EXAM: CT OF THE RIGHT HIP WITHOUT CONTRAST TECHNIQUE: Multidetector CT imaging of the right hip was performed according to the standard protocol. Multiplanar CT image reconstructions were also generated. RADIATION DOSE REDUCTION: This exam was performed according to the departmental dose-optimization program which includes automated exposure control, adjustment of the mA and/or kV according to patient size and/or use of iterative reconstruction technique. COMPARISON:  Right hip x-rays from same day. CT abdomen pelvis dated January 01, 2022. FINDINGS: Bones/Joint/Cartilage Acute impacted basicervical fracture of the right femoral neck with significant varus angulation. Additional comminuted fracture of the superior greater trochanter. The lesser trochanter is intact. No dislocation. Mild diffuse right hip joint space narrowing with small marginal osteophytes. No joint effusion. Ligaments Ligaments are suboptimally evaluated by CT. Muscles and Tendons Grossly  intact.  No significant muscle atrophy. Soft tissue Mild lateral hip soft tissue swelling. No fluid collection or hematoma. No soft tissue mass. IMPRESSION: 1. Acute basicervical fracture of the right femoral neck. Additional comminuted fracture of the superior greater trochanter. Electronically Signed   By: Obie Dredge M.D.   On: 09/22/2022 14:45   CT Head Wo Contrast  Result Date: 09/22/2022 CLINICAL DATA:  Head trauma, minor (Age >= 65y); Neck trauma (Age >= 65y) EXAM: CT HEAD WITHOUT CONTRAST CT CERVICAL SPINE WITHOUT CONTRAST TECHNIQUE: Multidetector CT imaging of the head and cervical spine was performed following the standard protocol without intravenous contrast. Multiplanar CT image reconstructions of the cervical spine were also generated. RADIATION DOSE REDUCTION: This exam was performed according to the departmental dose-optimization program which includes automated exposure control, adjustment of the mA and/or kV according to patient size and/or use of iterative reconstruction technique. COMPARISON:  None Available. FINDINGS: CT HEAD FINDINGS Brain: No evidence of acute infarction, hemorrhage, hydrocephalus, extra-axial collection or mass lesion/mass effect. Sequela of mild chronic microvascular ischemic change. Vascular: No hyperdense vessel or unexpected calcification. Skull: Normal. Negative for fracture or focal lesion. Sinuses/Orbits: No middle ear or mastoid effusion. Paranasal sinuses are clear. Mucosal thickening bilateral sphenoid sinuses. Orbits are unremarkable. Other: None. CT CERVICAL SPINE FINDINGS Alignment: Grade 1 anterolisthesis of C4 on C5 and C5 on C6. Skull base and vertebrae: No acute fracture. No primary bone lesion or focal pathologic process. Soft tissues and spinal canal: No prevertebral fluid or swelling. No visible canal hematoma. Disc levels:  No evidence of high-grade spinal canal stenosis. Upper chest: Negative. Other: 1.1 cm. This is not meet size criteria for  further workup. Right thyroid nodule IMPRESSION: 1. No acute intracranial abnormality. 2. No acute fracture or traumatic malalignment of the cervical spine. Electronically Signed   By: Lorenza Cambridge M.D.   On: 09/22/2022 14:16   CT Cervical Spine Wo Contrast  Result Date: 09/22/2022 CLINICAL DATA:  Head trauma, minor (Age >= 65y); Neck trauma (Age >= 65y) EXAM: CT HEAD WITHOUT CONTRAST CT CERVICAL SPINE WITHOUT CONTRAST TECHNIQUE: Multidetector CT imaging of the head and cervical spine was performed following the standard protocol without intravenous contrast. Multiplanar CT image reconstructions of the cervical spine were also generated. RADIATION DOSE REDUCTION: This exam was performed according to the departmental dose-optimization program which includes automated exposure control, adjustment of the mA and/or kV according to patient size and/or use of iterative reconstruction technique. COMPARISON:  None Available. FINDINGS: CT HEAD FINDINGS Brain: No evidence of acute infarction, hemorrhage, hydrocephalus, extra-axial collection or mass lesion/mass effect. Sequela of mild chronic microvascular ischemic change. Vascular: No hyperdense vessel or unexpected calcification. Skull: Normal. Negative  for fracture or focal lesion. Sinuses/Orbits: No middle ear or mastoid effusion. Paranasal sinuses are clear. Mucosal thickening bilateral sphenoid sinuses. Orbits are unremarkable. Other: None. CT CERVICAL SPINE FINDINGS Alignment: Grade 1 anterolisthesis of C4 on C5 and C5 on C6. Skull base and vertebrae: No acute fracture. No primary bone lesion or focal pathologic process. Soft tissues and spinal canal: No prevertebral fluid or swelling. No visible canal hematoma. Disc levels:  No evidence of high-grade spinal canal stenosis. Upper chest: Negative. Other: 1.1 cm. This is not meet size criteria for further workup. Right thyroid nodule IMPRESSION: 1. No acute intracranial abnormality. 2. No acute fracture or traumatic  malalignment of the cervical spine. Electronically Signed   By: Lorenza Cambridge M.D.   On: 09/22/2022 14:16   DG Ankle Complete Right  Result Date: 09/22/2022 CLINICAL DATA:  Multiple falls, pain. EXAM: RIGHT ANKLE - COMPLETE 3+ VIEW COMPARISON:  None Available. FINDINGS: No acute ankle fracture. Normal ankle alignment, no dislocation. The ankle mortise is preserved. Talar dome is intact. 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 There is a small plantar calcaneal spur. No ankle joint effusion. Fifth metatarsal shaft fracture is only included on the lateral view, suspect this is chronic with surrounding callus but incompletely characterized. IMPRESSION: 1. No acute ankle fracture. 2. Fifth metatarsal shaft fracture is only included on the lateral view, suspect this is chronic with surrounding callus but incompletely characterized. Recommend dedicated foot radiograph. Electronically Signed   By: Narda Rutherford M.D.   On: 09/22/2022 13:40   DG Hip Unilat W or Wo Pelvis 2-3 Views Right  Result Date: 09/22/2022 CLINICAL DATA:  Right leg pain, fall. EXAM: DG HIP (WITH OR WITHOUT PELVIS) 2-3V RIGHT COMPARISON:  None Available. FINDINGS: Right proximal femur fracture is likely transcervical femoral neck fracture, although assessed on subsequent CT. This is at the femoral neck intertrochanteric junction. Mild apex lateral angulation and proximal foreshortening. The femoral head remains seated. The bony pelvis is intact, no pubic rami fractures. No pubic symphyseal or sacroiliac diastasis. IMPRESSION: Right proximal femur fracture is likely transcervical femoral neck fracture. This has been further characterized on subsequent CT. Electronically Signed   By: Narda Rutherford M.D.   On: 09/22/2022 13:38   DG Chest 2 View  Result Date: 09/22/2022 CLINICAL DATA:  Multiple falls, pain. EXAM: CHEST - 2 VIEW COMPARISON:  Radiograph 02/16/2012 FINDINGS: Lung volumes are low.  The heart is mildly enlarged. Mediastinal contours are normal. No pneumothorax or pleural effusion. Small retrocardiac hiatal hernia. No focal airspace disease. Remote left anterior rib fractures. Compression fracture of the lower thoracic vertebra, age indeterminate. IMPRESSION: 1. Low lung volumes.  No evidence of intrathoracic injury. 2. Compression fracture of a lower thoracic vertebra, age indeterminate. Recommend correlation for acute thoracic back pain. 3. Mild cardiomegaly. Small retrocardiac hiatal hernia. Electronically Signed   By: Narda Rutherford M.D.   On: 09/22/2022 13:37   DG Shoulder Left  Result Date: 09/22/2022 CLINICAL DATA:  Multiple falls, pain. EXAM: LEFT SHOULDER - 2+ VIEW COMPARISON:  None Available. FINDINGS: There is no evidence of fracture or dislocation. There is no evidence of arthropathy or other focal bone abnormality. Soft tissues are unremarkable. IMPRESSION: No fracture or dislocation of the left shoulder. Electronically Signed   By: Narda Rutherford M.D.   On: 09/22/2022 13:34        Scheduled Meds:  acetaminophen  1,000 mg Oral Q6H   atorvastatin  40 mg Oral Daily  diclofenac Sodium  2 g Topical QID   docusate sodium  100 mg Oral BID   enoxaparin (LOVENOX) injection  40 mg Subcutaneous Q24H   FLUoxetine  20 mg Oral Daily   insulin aspart  0-9 Units Subcutaneous TID WC   losartan  25 mg Oral Daily   oxybutynin  10 mg Oral BID   pantoprazole  40 mg Oral BID   Continuous Infusions:  sodium chloride Stopped (09/24/22 0308)   methocarbamol (ROBAXIN) IV       LOS: 1 day    Time spent: 45 minutes spent on chart review, discussion with nursing staff, consultants, updating family and interview/physical exam; more than 50% of that time was spent in counseling and/or coordination of care.    Joseph Art, DO [facility] Available via Epic secure chat 7am-7pm After these hours, please refer to coverage provider listed on [email or web address] 09/24/2022,  11:18 AM

## 2022-09-24 NOTE — Progress Notes (Signed)
   Subjective:  Patient reports pain as moderate.    Objective:   VITALS:   Vitals:   09/23/22 1737 09/23/22 1917 09/24/22 0409 09/24/22 0837  BP: 117/67 102/62 103/63 114/63  Pulse: 100 (!) 112 86 99  Resp: 16  18 16   Temp:  98.2 F (36.8 C) 98 F (36.7 C) 98.1 F (36.7 C)  TempSrc:  Oral Oral   SpO2: 97% 93% 96% 92%  Weight:      Height:        Sensation intact distally Intact pulses distally Dorsiflexion/Plantar flexion intact Incision: dressing C/D/I and no drainage No cellulitis present Compartment soft   Lab Results  Component Value Date   WBC 17.5 (H) 09/23/2022   HGB 9.7 (L) 09/23/2022   HCT 29.6 (L) 09/23/2022   MCV 91.6 09/23/2022   PLT 311 09/23/2022     Assessment/Plan:  1 Day Post-Op   - Expected postop acute blood loss anemia - Up with PT/OT - DVT ppx - SCDs, ambulation, lovenox - 25% PWB operative extremity - Pain control - Discharge planning  Glee Arvin 09/24/2022, 1:30 PM

## 2022-09-24 NOTE — Anesthesia Postprocedure Evaluation (Signed)
Anesthesia Post Note  Patient: [name]  Procedure(s) Performed: INTRAMEDULLARY (IM) NAIL INTERTROCHANTERIC (Right: Leg Upper)     Patient location during evaluation: PACU Anesthesia Type: General Level of consciousness: awake Pain management: pain level controlled Vital Signs Assessment: post-procedure vital signs reviewed and stable Respiratory status: spontaneous breathing, nonlabored ventilation and respiratory function stable Cardiovascular status: blood pressure returned to baseline and stable Postop Assessment: no apparent nausea or vomiting Anesthetic complications: no   No notable events documented.  Last Vitals:  Vitals:   09/24/22 0409 09/24/22 0837  BP: 103/63 114/63  Pulse: 86 99  Resp: 18 16  Temp: 36.7 C 36.7 C  SpO2: 96% 92%    Last Pain:  Vitals:   09/24/22 0905  TempSrc:   PainSc: 4                  Sabreen Kitchen P Sherlonda Flater

## 2022-09-25 DIAGNOSIS — S72141A Displaced intertrochanteric fracture of right femur, initial encounter for closed fracture: Secondary | ICD-10-CM | POA: Diagnosis not present

## 2022-09-25 LAB — CBC
HCT: 22.4 % — ABNORMAL LOW (ref 36.0–46.0)
Hemoglobin: 7.7 g/dL — ABNORMAL LOW (ref 12.0–15.0)
MCH: 30.6 pg (ref 26.0–34.0)
MCHC: 34.4 g/dL (ref 30.0–36.0)
MCV: 88.9 fL (ref 80.0–100.0)
Platelets: 295 10*3/uL (ref 150–400)
RBC: 2.52 MIL/uL — ABNORMAL LOW (ref 3.87–5.11)
RDW: 15.6 % — ABNORMAL HIGH (ref 11.5–15.5)
WBC: 11.2 10*3/uL — ABNORMAL HIGH (ref 4.0–10.5)
nRBC: 0 % (ref 0.0–0.2)

## 2022-09-25 LAB — GLUCOSE, CAPILLARY
Glucose-Capillary: 122 mg/dL — ABNORMAL HIGH (ref 70–99)
Glucose-Capillary: 106 mg/dL — ABNORMAL HIGH (ref 70–99)
Glucose-Capillary: 107 mg/dL — ABNORMAL HIGH (ref 70–99)
Glucose-Capillary: 205 mg/dL — ABNORMAL HIGH (ref 70–99)

## 2022-09-25 LAB — BASIC METABOLIC PANEL
Anion gap: 7 (ref 5–15)
BUN: 10 mg/dL (ref 8–23)
CO2: 21 mmol/L — ABNORMAL LOW (ref 22–32)
Calcium: 7.7 mg/dL — ABNORMAL LOW (ref 8.9–10.3)
Chloride: 102 mmol/L (ref 98–111)
Creatinine, Ser: 0.69 mg/dL (ref 0.44–1.00)
GFR, Estimated: >60 mL/min (ref >60)
Glucose, Bld: 116 mg/dL — ABNORMAL HIGH (ref 70–99)
Potassium: 3.6 mmol/L (ref 3.5–5.1)
Sodium: 130 mmol/L — ABNORMAL LOW (ref 135–145)

## 2022-09-25 NOTE — Progress Notes (Signed)
Physical Therapy Treatment Patient Details Name: Rhonda Fields MRN: [id number] DOB: September 21, 1950 Today's Date: 09/25/2022   History of Present Illness Pt is a 72 y.o. female admitted 9/3 following a fall at home. Xray revealed R hip fx. Pt underwent IMN 9/4. PMH: diabetes, GERD, hypertension, depression, anxiety, hyperlipidemia    PT Comments  Pt seen for PT tx with pt agreeable. Pt performs RLE exercises with cuing for technique, AAROM and significantly extra time. Attempted to assist pt with supine>sit with hospital bed features but pt requiring max encouragement to allow PT to assist her & ultimately unable to come to sitting EOB. Pt is very limited by pain & fear of pain with movement. Will continue to follow pt acutely to progress mobility as able; would be appropriate for skilled co-tx with OT to maximize mobility.    If plan is discharge home, recommend the following: Two people to help with walking and/or transfers;A lot of help with bathing/dressing/bathroom   Can travel by private vehicle     No  Equipment Recommendations  None recommended by PT (defer to next venue)    Recommendations for Other Services       Precautions / Restrictions Precautions Precautions: Fall Restrictions Weight Bearing Restrictions: Yes RLE Weight Bearing: Partial weight bearing RLE Partial Weight Bearing Percentage or Pounds: 25%     Mobility  Bed Mobility               General bed mobility comments: PT attempted to assist pt with transitioning to sitting EOB with PT providing assistance for scooting hips to R to side of bed, attempting to assist pt with RLE hip abduction, providing cuing for hand placement, sequencing & technique but pt requiring significantly extra time & wanting to attempt task on her own time with her method (using LLE to assist RLE). Pt attempting to transition from semi fowler to long sit prior to sitting EOB but pt ultimately unable to complete supine>sit. Pt is  able to scoot to [facility] with bed rails, LLE, & bed in trendelenburg position.    Transfers Overall transfer level:  (When PT moved chair closer to bed at beginning of session pt states "that's not happening today".)                      Ambulation/Gait                   Stairs             Wheelchair Mobility     Tilt Bed    Modified Rankin (Stroke Patients Only)       Balance                                            Cognition Arousal: Alert Behavior During Therapy: Anxious Overall Cognitive Status: No family/caregiver present to determine baseline cognitive functioning                                 General Comments: Internally distracted by pain, not very willing to allow PT to assist her, somewhat directed session.        Exercises General Exercises - Lower Extremity Heel Slides: AAROM, Supine, Strengthening, Right, 5 reps (significantly extra time to perform single rep, rest break in between each rep) Hip  ABduction/ADduction: AAROM, Supine, Strengthening, Right (12 reps)    General Comments        Pertinent Vitals/Pain Pain Assessment Pain Assessment: Faces Faces Pain Scale: Hurts worst Pain Location: RLE Pain Descriptors / Indicators: Grimacing, Guarding, Sharp, Crying Pain Intervention(s): Monitored during session, Limited activity within patient's tolerance, Premedicated before session, Repositioned    Home Living                          Prior Function            PT Goals (current goals can now be found in the care plan section) Acute Rehab PT Goals Patient Stated Goal: decrease pain PT Goal Formulation: With patient Time For Goal Achievement: 10/08/22 Potential to Achieve Goals: Fair Progress towards PT goals: Progressing toward goals    Frequency    Min 1X/week      PT Plan      Co-evaluation              AM-PAC PT "6 Clicks" Mobility   Outcome Measure   Help needed turning from your back to your side while in a flat bed without using bedrails?: A Lot Help needed moving from lying on your back to sitting on the side of a flat bed without using bedrails?: Total Help needed moving to and from a bed to a chair (including a wheelchair)?: Total Help needed standing up from a chair using your arms (e.g., wheelchair or bedside chair)?: Total Help needed to walk in hospital room?: Total Help needed climbing 3-5 steps with a railing? : Total 6 Click Score: 7    End of Session   Activity Tolerance: Patient limited by pain Patient left: in bed;with call bell/phone within reach;with bed alarm set   PT Visit Diagnosis: Other abnormalities of gait and mobility (R26.89);Muscle weakness (generalized) (M62.81);Pain;Difficulty in walking, not elsewhere classified (R26.2) Pain - Right/Left: Right Pain - part of body: Hip;Leg     Time: 4259-5638 PT Time Calculation (min) (ACUTE ONLY): 26 min  Charges:    $Therapeutic Activity: 23-37 mins PT General Charges $$ ACUTE PT VISIT: 1 Visit                     Aleda Grana, PT, DPT 09/25/22, 12:31 PM   Sandi Mariscal 09/25/2022, 12:30 PM

## 2022-09-25 NOTE — Progress Notes (Signed)
PROGRESS NOTE    Rhonda Fields  [id number]:[id number] DOB: 1950-06-03 DOA: 09/22/2022 PCP: Rhonda Brooks, MD    Brief Narrative:  Rhonda Fields is a 72 y.o. female with medical history significant of diabetes, GERD, hypertension, depression, anxiety, hyperlipidemia presenting after multiple falls at home.  CT of the hip showed right femur neck fracture.  Orthopedist consulted -- surgery done 9/4.   Assessment and Plan: Right femur neck fracture Falls > Multiple falls in the last week.  Found to have fractures of the right femur neck on imaging. > Orthopedic consulted: surgery 9/4 - Continue with as needed pain medications with Norco for moderate to severe pain and Dilaudid for severe breakthrough pain - PT/OT- SNF- TOC consult   AKI > Creatinine elevated to 1.5 from baseline 0.9.  Did receive a liter of fluids in the ED. -resolved   Acute blood loss anemia -expected post op-- follow  Leukocytosis > Noted to have leukocytosis 17.3 upon admission-- prob reactive -trending down   Diabetes - SSI   GERD - Continue PPI   Depression/Anxiety - Continue fluoxetine   Hyperlipidemia - Continue home atorvastatin   Hypertension - Continue losartan   DVT prophylaxis: enoxaparin (LOVENOX) injection 40 mg Start: 09/24/22 1200 SCDs Start: 09/23/22 1721 Place TED hose Start: 09/23/22 1721 SCDs Start: 09/22/22 1556    Code Status: Full Code   Disposition Plan:  Level of care: Med-Surg Status is: inpt    Consultants:  ortho   Subjective: No currently complaints   Objective: Vitals:   09/24/22 0837 09/24/22 1921 09/25/22 0409 09/25/22 0730  BP: 114/63 (!) 141/73 121/70 (!) 119/58  Pulse: 99 (!) 101 (!) 106   Resp: 16 18 18 17   Temp: 98.1 F (36.7 C) 99 F (37.2 C) 98.9 F (37.2 C)   TempSrc:  Oral Oral   SpO2: 92% 95% 92% 92%  Weight:      Height:        Intake/Output Summary (Last 24 hours) at 09/25/2022 1013 Last data filed at 09/25/2022  0414 Gross per 24 hour  Intake 0 ml  Output 750 ml  Net -750 ml   Filed Weights   09/22/22 1033 09/23/22 1214  Weight: 65.4 kg 65.8 kg    Examination:    General: Appearance:     Overweight female in no acute distress     Lungs:     respirations unlabored  Heart:    Tachycardic. Normal rhythm. No murmurs, rubs, or gallops.   MS:   All extremities are intact.   Neurologic:   Awake, alert      Data Reviewed: I have personally reviewed following labs and imaging studies  CBC: Recent Labs  Lab 09/22/22 1101 09/23/22 0813 09/23/22 1750 09/25/22 0921  WBC 17.3* 8.7 17.5* 11.2*  NEUTROABS 15.7*  --   --   --   HGB 10.8* 9.9* 9.7* 7.7*  HCT 33.3* 30.3* 29.6* 22.4*  MCV 89.5 91.0 91.6 88.9  PLT 346 288 311 295   Basic Metabolic Panel: Recent Labs  Lab 09/22/22 1101 09/23/22 0813 09/23/22 1750 09/25/22 0921  NA 132* 136  --  130*  K 4.8 4.3  --  3.6  CL 100 106  --  102  CO2 12* 16*  --  21*  GLUCOSE 105* 110*  --  116*  BUN 39* 25*  --  10  CREATININE 1.50* 0.89 0.82 0.69  CALCIUM 9.0 8.6*  --  7.7*   GFR: Estimated Creatinine Clearance:  57.4 mL/min (by C-G formula based on SCr of 0.69 mg/dL). Liver Function Tests: No results for input(s): "AST", "ALT", "ALKPHOS", "BILITOT", "PROT", "ALBUMIN" in the last 168 hours. No results for input(s): "LIPASE", "AMYLASE" in the last 168 hours. No results for input(s): "AMMONIA" in the last 168 hours. Coagulation Profile: No results for input(s): "INR", "PROTIME" in the last 168 hours. Cardiac Enzymes: Recent Labs  Lab 09/22/22 1101  CKTOTAL 297*   BNP (last 3 results) No results for input(s): "PROBNP" in the last 8760 hours. HbA1C: No results for input(s): "HGBA1C" in the last 72 hours. CBG: Recent Labs  Lab 09/24/22 0834 09/24/22 1158 09/24/22 1610 09/24/22 1927 09/25/22 0728  GLUCAP 96 121* 129* 105* 122*   Lipid Profile: No results for input(s): "CHOL", "HDL", "LDLCALC", "TRIG", "CHOLHDL",  "LDLDIRECT" in the last 72 hours. Thyroid Function Tests: No results for input(s): "TSH", "T4TOTAL", "FREET4", "T3FREE", "THYROIDAB" in the last 72 hours. Anemia Panel: No results for input(s): "VITAMINB12", "FOLATE", "FERRITIN", "TIBC", "IRON", "RETICCTPCT" in the last 72 hours. Sepsis Labs: Recent Labs  Lab 09/22/22 1635 09/23/22 0813  PROCALCITON 2.72 1.59    Recent Results (from the past 240 hour(s))  Surgical pcr screen     Status: None   Collection Time: 09/23/22  9:27 AM   Specimen: Nasal Mucosa; Nasal Swab  Result Value Ref Range Status   MRSA, PCR NEGATIVE NEGATIVE Final   Staphylococcus aureus NEGATIVE NEGATIVE Final    Comment: (NOTE) The Xpert SA Assay (FDA approved for NASAL specimens in patients 29 years of age and older), is one component of a comprehensive surveillance program. It is not intended to diagnose infection nor to guide or monitor treatment. Performed at [facility], [address] [place], [place] [address]          Radiology Studies: DG FEMUR PORT, MIN 2 VIEWS RIGHT  Result Date: 09/23/2022 CLINICAL DATA:  [id number] History of open reduction and internal fixation (ORIF) procedure [id number] EXAM: RIGHT FEMUR PORTABLE 2 VIEW COMPARISON:  CT right hip 09/22/2022 FINDINGS: Right femoral neck fracture status post intramedullary nail fixation. No radiographic findings suggest surgical hardware complication. There is no evidence of acute distal fracture or other focal bone lesions. No hip dislocation. Knee is grossly unremarkable. Right hip subcutaneus soft tissue edema and emphysema consistent with surgical changes. Overlying skin staples. Vascular calcifications. IMPRESSION: Right femoral neck fracture status post intramedullary nail fixation. Electronically Signed   By: Tish Frederickson M.D.   On: 09/23/2022 20:47   DG FEMUR, MIN 2 VIEWS RIGHT  Result Date: 09/23/2022 CLINICAL DATA:  Basicervical fracture of the right femoral neck. ORIF. EXAM:  OPERATIVE right HIP (WITH PELVIS IF PERFORMED) 5 VIEWS TECHNIQUE: Fluoroscopic spot image(s) were submitted for interpretation post-operatively. Radiation Exposure Index (as provided by the fluoroscopic device): Dose area product 26.52 mGy COMPARISON:  CT of the right hip 09/22/2022 FINDINGS: Intraoperative images demonstrate placement of a right intramedullary femoral rod. Two distal locking screws are present. Two dynamic screws traverse the femoral neck. The fracture is reduced. IMPRESSION: ORIF of right femoral neck fracture without radiographic evidence for complication. Electronically Signed   By: Marin Roberts M.D.   On: 09/23/2022 19:44   DG C-Arm 1-60 Min-No Report  Result Date: 09/23/2022 Fluoroscopy was utilized by the requesting physician.  No radiographic interpretation.   DG C-Arm 1-60 Min-No Report  Result Date: 09/23/2022 Fluoroscopy was utilized by the requesting physician.  No radiographic interpretation.        Scheduled Meds:  atorvastatin  40 mg Oral Daily   diclofenac Sodium  2 g Topical QID   docusate sodium  100 mg Oral BID   enoxaparin (LOVENOX) injection  40 mg Subcutaneous Q24H   FLUoxetine  20 mg Oral Daily   insulin aspart  0-9 Units Subcutaneous TID WC   losartan  25 mg Oral Daily   oxybutynin  10 mg Oral BID   pantoprazole  40 mg Oral BID   Continuous Infusions:  sodium chloride Stopped (09/24/22 0308)   methocarbamol (ROBAXIN) IV       LOS: 2 days    Time spent: 45 minutes spent on chart review, discussion with nursing staff, consultants, updating family and interview/physical exam; more than 50% of that time was spent in counseling and/or coordination of care.    Joseph Art, DO [facility] Available via Epic secure chat 7am-7pm After these hours, please refer to coverage provider listed on [email or web address] 09/25/2022, 10:13 AM

## 2022-09-25 NOTE — TOC Progression Note (Signed)
Transition of Care [facility]) - Progression Note    Patient Details  Name: Rhonda Fields MRN: [id number] Date of Birth: 1951/01/11  Transition of Care [facility]) CM/SW Contact  Dellie Burns [place], [place] Phone Number: 09/25/2022, 2:13 PM  Clinical Narrative:  met with pt at bedside and provided current SNF offers. Pt states she is considering [facility] vs [facility]. Will f/u with pt tomorrow re SNF choice. Will need auth for SNF started once facility chosen.   Dellie Burns, MSW, LCSW [phone] (coverage)       Expected Discharge Plan: Skilled Nursing Facility Barriers to Discharge: Continued Medical Work up, SNF Pending bed offer, Insurance Authorization  Expected Discharge Plan and Services       Living arrangements for the past 2 months: Skilled Nursing Facility                                       Social Determinants of Health (SDOH) Interventions SDOH Screenings   Food Insecurity: No Food Insecurity (09/22/2022)  Housing: Low Risk  (09/22/2022)  Transportation Needs: No Transportation Needs (09/22/2022)  Utilities: Not At Risk (09/22/2022)  Alcohol Screen: Low Risk  (03/06/2022)  Depression (PHQ2-9): Low Risk  (08/04/2022)  Financial Resource Strain: Low Risk  (03/06/2022)  Physical Activity: Insufficiently Active (03/06/2022)  Social Connections: Socially Isolated (03/06/2022)  Stress: No Stress Concern Present (03/06/2022)  Tobacco Use: High Risk (09/23/2022)    Readmission Risk Interventions     No data to display

## 2022-09-25 NOTE — Care Management Important Message (Signed)
Important Message  Patient Details  Name: ELIZ VITATOE MRN: [id number] Date of Birth: 08-14-1950   Medicare Important Message Given:  Yes     Sherilyn Banker 09/25/2022, 2:35 PM

## 2022-09-26 DIAGNOSIS — S72141A Displaced intertrochanteric fracture of right femur, initial encounter for closed fracture: Secondary | ICD-10-CM | POA: Diagnosis not present

## 2022-09-26 LAB — BASIC METABOLIC PANEL
Anion gap: 11 (ref 5–15)
BUN: 8 mg/dL (ref 8–23)
CO2: 22 mmol/L (ref 22–32)
Calcium: 7.9 mg/dL — ABNORMAL LOW (ref 8.9–10.3)
Chloride: 98 mmol/L (ref 98–111)
Creatinine, Ser: 0.64 mg/dL (ref 0.44–1.00)
GFR, Estimated: >60 mL/min (ref >60)
Glucose, Bld: 137 mg/dL — ABNORMAL HIGH (ref 70–99)
Potassium: 3.6 mmol/L (ref 3.5–5.1)
Sodium: 131 mmol/L — ABNORMAL LOW (ref 135–145)

## 2022-09-26 LAB — CBC
HCT: 22.7 % — ABNORMAL LOW (ref 36.0–46.0)
Hemoglobin: 7.7 g/dL — ABNORMAL LOW (ref 12.0–15.0)
MCH: 29.6 pg (ref 26.0–34.0)
MCHC: 33.9 g/dL (ref 30.0–36.0)
MCV: 87.3 fL (ref 80.0–100.0)
Platelets: 314 10*3/uL (ref 150–400)
RBC: 2.6 MIL/uL — ABNORMAL LOW (ref 3.87–5.11)
RDW: 15.2 % (ref 11.5–15.5)
WBC: 11 10*3/uL — ABNORMAL HIGH (ref 4.0–10.5)
nRBC: 0.6 % — ABNORMAL HIGH (ref 0.0–0.2)

## 2022-09-26 LAB — GLUCOSE, CAPILLARY
Glucose-Capillary: 136 mg/dL — ABNORMAL HIGH (ref 70–99)
Glucose-Capillary: 138 mg/dL — ABNORMAL HIGH (ref 70–99)
Glucose-Capillary: 143 mg/dL — ABNORMAL HIGH (ref 70–99)
Glucose-Capillary: 129 mg/dL — ABNORMAL HIGH (ref 70–99)

## 2022-09-26 MED ORDER — OXYCODONE HCL 5 MG PO TABS
5.0000 mg | ORAL_TABLET | Freq: Three times a day (TID) | ORAL | 0 refills | Status: AC | PRN
Start: 2022-09-26 — End: ?

## 2022-09-26 MED ORDER — ENOXAPARIN SODIUM 40 MG/0.4ML IJ SOSY
40.0000 mg | PREFILLED_SYRINGE | Freq: Every day | INTRAMUSCULAR | 0 refills | Status: AC
Start: 2022-09-26 — End: 2022-10-10

## 2022-09-26 NOTE — Progress Notes (Signed)
PROGRESS NOTE    Rhonda Fields  VOZ:[id number] DOB: [birth year] DOA: 09/22/2022 PCP: Donita Brooks, MD   Brief Narrative:  Rhonda Fields is a 72 y.o. female with medical history significant of diabetes, GERD, hypertension, depression, anxiety, hyperlipidemia presenting after multiple falls at home.  CT of the hip showed right femur neck fracture.  Orthopedist consulted -- surgery done 9/4.   Assessment & Plan:   Principal Problem:   Displaced intertrochanteric fracture of right femur, initial encounter for closed fracture (HCC) Active Problems:   DM2 (diabetes mellitus, type 2) (HCC)   AKI (acute kidney injury) (HCC)   Leukocytosis   GERD (gastroesophageal reflux disease)   Depression   Anxiety   HTN (hypertension)   HLD (hyperlipidemia)   Hip fracture (HCC)   Displaced spiral fracture of shaft of right femur, initial encounter for closed fracture (HCC)  Right femur neck fracture Falls > Multiple falls in the last week.  Found to have fractures of the right femur neck on imaging. Orthopedic consulted: surgery 9/4.  Doing well postoperatively.  Seen by PT OT, SNF recommended.  TOC has provided her with the options and patient has just not selected her choice.  She was encouraged to do so as soon as possible.   AKI Resolved.   Acute blood loss anemia Hemoglobin dropped from 10.8 preoperatively to 7.7 postoperatively however has remained stable since yesterday.  No indication for transfusion.  Monitor daily and transfuse if less than 7.   Leukocytosis Improving, no signs of infection.   Diabetes melitis type II: Recent hemoglobin A1c 1 month ago was 6.5.  She is on SSI and blood sugar controlled.   GERD - Continue PPI   Depression/Anxiety - Continue fluoxetine   Hyperlipidemia - Continue home atorvastatin   Hypertension -Controlled.  Continue losartan  DVT prophylaxis: enoxaparin (LOVENOX) injection 40 mg Start: 09/24/22 1200 SCDs Start:  09/23/22 1721 Place TED hose Start: 09/23/22 1721 SCDs Start: 09/22/22 1556   Code Status: Full Code  Family Communication:  None present at bedside.  Plan of care discussed with patient in length and he/she verbalized understanding and agreed with it.  Status is: Inpatient Remains inpatient appropriate because: Patient is medically stable but she needs to select a SNF choice so she can be discharged.   Estimated body mass index is 26.52 kg/m as calculated from the following:   Height as of this encounter: 5\' 2"  (1.575 m).   Weight as of this encounter: 65.8 kg.    Nutritional Assessment: Body mass index is 26.52 kg/m.Marland Kitchen Seen by dietician.  I agree with the assessment and plan as outlined below: Nutrition Status:        . Skin Assessment: I have examined the patient's skin and I agree with the wound assessment as performed by the wound care RN as outlined below:    Consultants:  Orthopedics  Procedures:  As above  Antimicrobials:  Anti-infectives (From admission, onward)    Start     Dose/Rate Route Frequency Ordered Stop   09/23/22 2000  ceFAZolin (ANCEF) IVPB 2g/100 mL premix        2 g 200 mL/hr over 30 Minutes Intravenous Every 6 hours 09/23/22 1720 09/24/22 1155   09/23/22 1230  ceFAZolin (ANCEF) IVPB 2g/100 mL premix        2 g 200 mL/hr over 30 Minutes Intravenous On call to O.R. 09/23/22 1216 09/23/22 1448   09/23/22 1149  ceFAZolin (ANCEF) 2-4 GM/100ML-% IVPB  Note to Pharmacy: Launa Flight M: cabinet override      09/23/22 1149 09/23/22 1438         Subjective: Patient seen and examined.  She has no new complaint.  Objective: Vitals:   09/25/22 1345 09/25/22 2044 09/26/22 0617 09/26/22 0736  BP: 126/68 (!) 141/68 (!) 120/55 (!) 117/59  Pulse: (!) 103 (!) 109 (!) 103 92  Resp: 18  15   Temp: 98.3 F (36.8 C) 98.7 F (37.1 C) 98.4 F (36.9 C) 98.4 F (36.9 C)  TempSrc: Oral Oral  Oral  SpO2: 95% 99% 91% 95%  Weight:      Height:         Intake/Output Summary (Last 24 hours) at 09/26/2022 1110 Last data filed at 09/26/2022 0715 Gross per 24 hour  Intake 240 ml  Output --  Net 240 ml   Filed Weights   09/22/22 1033 09/23/22 1214  Weight: 65.4 kg 65.8 kg    Examination:  General exam: Appears calm and comfortable  Respiratory system: Clear to auscultation. Respiratory effort normal. Cardiovascular system: S1 & S2 heard, RRR. No JVD, murmurs, rubs, gallops or clicks. No pedal edema. Gastrointestinal system: Abdomen is nondistended, soft and nontender. No organomegaly or masses felt. Normal bowel sounds heard. Central nervous system: Alert and oriented. No focal neurological deficits. Extremities: Symmetric 5 x 5 power. Skin: No rashes, lesions or ulcers Psychiatry: Judgement and insight appear normal. Mood & affect appropriate.    Data Reviewed: I have personally reviewed following labs and imaging studies  CBC: Recent Labs  Lab 09/22/22 1101 09/23/22 0813 09/23/22 1750 09/25/22 0921 09/26/22 0803  WBC 17.3* 8.7 17.5* 11.2* 11.0*  NEUTROABS 15.7*  --   --   --   --   HGB 10.8* 9.9* 9.7* 7.7* 7.7*  HCT 33.3* 30.3* 29.6* 22.4* 22.7*  MCV 89.5 91.0 91.6 88.9 87.3  PLT 346 288 311 295 314   Basic Metabolic Panel: Recent Labs  Lab 09/22/22 1101 09/23/22 0813 09/23/22 1750 09/25/22 0921 09/26/22 0803  NA 132* 136  --  130* 131*  K 4.8 4.3  --  3.6 3.6  CL 100 106  --  102 98  CO2 12* 16*  --  21* 22  GLUCOSE 105* 110*  --  116* 137*  BUN 39* 25*  --  10 8  CREATININE 1.50* 0.89 0.82 0.69 0.64  CALCIUM 9.0 8.6*  --  7.7* 7.9*   GFR: Estimated Creatinine Clearance: 57.4 mL/min (by C-G formula based on SCr of 0.64 mg/dL). Liver Function Tests: No results for input(s): "AST", "ALT", "ALKPHOS", "BILITOT", "PROT", "ALBUMIN" in the last 168 hours. No results for input(s): "LIPASE", "AMYLASE" in the last 168 hours. No results for input(s): "AMMONIA" in the last 168 hours. Coagulation Profile: No  results for input(s): "INR", "PROTIME" in the last 168 hours. Cardiac Enzymes: Recent Labs  Lab 09/22/22 1101  CKTOTAL 297*   BNP (last 3 results) No results for input(s): "PROBNP" in the last 8760 hours. HbA1C: No results for input(s): "HGBA1C" in the last 72 hours. CBG: Recent Labs  Lab 09/25/22 0728 09/25/22 1136 09/25/22 1605 09/25/22 2002 09/26/22 0738  GLUCAP 122* 106* 107* 205* 136*   Lipid Profile: No results for input(s): "CHOL", "HDL", "LDLCALC", "TRIG", "CHOLHDL", "LDLDIRECT" in the last 72 hours. Thyroid Function Tests: No results for input(s): "TSH", "T4TOTAL", "FREET4", "T3FREE", "THYROIDAB" in the last 72 hours. Anemia Panel: No results for input(s): "VITAMINB12", "FOLATE", "FERRITIN", "TIBC", "IRON", "RETICCTPCT" in the last  72 hours. Sepsis Labs: Recent Labs  Lab 09/22/22 1635 09/23/22 0813  PROCALCITON 2.72 1.59    Recent Results (from the past 240 hour(s))  Surgical pcr screen     Status: None   Collection Time: 09/23/22  9:27 AM   Specimen: Nasal Mucosa; Nasal Swab  Result Value Ref Range Status   MRSA, PCR NEGATIVE NEGATIVE Final   Staphylococcus aureus NEGATIVE NEGATIVE Final    Comment: (NOTE) The Xpert SA Assay (FDA approved for NASAL specimens in patients 37 years of age and older), is one component of a comprehensive surveillance program. It is not intended to diagnose infection nor to guide or monitor treatment. Performed at [facility], [address] [place], [place] [address]      Radiology Studies: No results found.  Scheduled Meds:  atorvastatin  40 mg Oral Daily   diclofenac Sodium  2 g Topical QID   docusate sodium  100 mg Oral BID   enoxaparin (LOVENOX) injection  40 mg Subcutaneous Q24H   FLUoxetine  20 mg Oral Daily   insulin aspart  0-9 Units Subcutaneous TID WC   losartan  25 mg Oral Daily   oxybutynin  10 mg Oral BID   pantoprazole  40 mg Oral BID   Continuous Infusions:  sodium chloride Stopped  (09/24/22 0308)   methocarbamol (ROBAXIN) IV       LOS: 3 days   Hughie Closs, MD [facility]  09/26/2022, 11:10 AM   *Please note that this is a verbal dictation therefore any spelling or grammatical errors are due to the "Dragon Medical One" system interpretation.  Please page via Amion and do not message via secure chat for urgent patient care matters. Secure chat can be used for non urgent patient care matters.  How to contact the [facility] Attending or Consulting provider 7A - 7P or covering provider during after hours 7P -7A, for this patient?  Check the care team in [facility] and look for a) attending/consulting TRH provider listed and b) the [facility] team listed. Page or secure chat 7A-7P. Log into [email or web address] and use [place]'s universal password to access. If you do not have the password, please contact the hospital operator. Locate the [facility] provider you are looking for under [facility] and page to a number that you can be directly reached. If you still have difficulty reaching the provider, please page the [facility] (Director on Call) for the Hospitalists listed on amion for assistance.

## 2022-09-26 NOTE — Plan of Care (Signed)
  Problem: Pain Managment: Goal: General experience of comfort will improve Outcome: Progressing   Problem: Activity: Goal: Risk for activity intolerance will decrease Outcome: Not Progressing

## 2022-09-26 NOTE — Progress Notes (Signed)
   Subjective:  Patient reports pain as mild.  No events.  Objective:   VITALS:   Vitals:   09/25/22 1345 09/25/22 2044 09/26/22 0617 09/26/22 0736  BP: 126/68 (!) 141/68 (!) 120/55 (!) 117/59  Pulse: (!) 103 (!) 109 (!) 103 92  Resp: 18  15   Temp: 98.3 F (36.8 C) 98.7 F (37.1 C) 98.4 F (36.9 C) 98.4 F (36.9 C)  TempSrc: Oral Oral  Oral  SpO2: 95% 99% 91% 95%  Weight:      Height:        Exam stable. Dressings c/d/I Thigh soft   Lab Results  Component Value Date   WBC 11.0 (H) 09/26/2022   HGB 7.7 (L) 09/26/2022   HCT 22.7 (L) 09/26/2022   MCV 87.3 09/26/2022   PLT 314 09/26/2022     Assessment/Plan:  3 Days Post-Op   Weightbearing: PWB 25%  RLE Insicional and dressing care: Reinforce dressings as needed Orthopedic device(s): None Showering: light shower 5 days after surgery VTE prophylaxis: Lovenox 40mg  qd  2 weeks Pain control: oxycodone 5 mg prn Follow - up plan: 2 weeks Contact information:  Tessa Lerner PA   Glee Arvin 09/26/2022, 9:37 AM

## 2022-09-27 DIAGNOSIS — S72141A Displaced intertrochanteric fracture of right femur, initial encounter for closed fracture: Secondary | ICD-10-CM | POA: Diagnosis not present

## 2022-09-27 LAB — CBC WITH DIFFERENTIAL/PLATELET
Abs Immature Granulocytes: 0.14 10*3/uL — ABNORMAL HIGH (ref 0.00–0.07)
Basophils Absolute: 0 10*3/uL (ref 0.0–0.1)
Basophils Relative: 0 %
Eosinophils Absolute: 0.1 10*3/uL (ref 0.0–0.5)
Eosinophils Relative: 1 %
HCT: 21.9 % — ABNORMAL LOW (ref 36.0–46.0)
Hemoglobin: 7.4 g/dL — ABNORMAL LOW (ref 12.0–15.0)
Immature Granulocytes: 1 %
Lymphocytes Relative: 11 %
Lymphs Abs: 1.1 10*3/uL (ref 0.7–4.0)
MCH: 30.1 pg (ref 26.0–34.0)
MCHC: 33.8 g/dL (ref 30.0–36.0)
MCV: 89 fL (ref 80.0–100.0)
Monocytes Absolute: 0.9 10*3/uL (ref 0.1–1.0)
Monocytes Relative: 9 %
Neutro Abs: 7.9 10*3/uL — ABNORMAL HIGH (ref 1.7–7.7)
Neutrophils Relative %: 78 %
Platelets: 302 10*3/uL (ref 150–400)
RBC: 2.46 MIL/uL — ABNORMAL LOW (ref 3.87–5.11)
RDW: 15.1 % (ref 11.5–15.5)
WBC: 10.1 10*3/uL (ref 4.0–10.5)
nRBC: 0.7 % — ABNORMAL HIGH (ref 0.0–0.2)

## 2022-09-27 LAB — BASIC METABOLIC PANEL
Anion gap: 10 (ref 5–15)
BUN: 7 mg/dL — ABNORMAL LOW (ref 8–23)
CO2: 26 mmol/L (ref 22–32)
Calcium: 8.1 mg/dL — ABNORMAL LOW (ref 8.9–10.3)
Chloride: 97 mmol/L — ABNORMAL LOW (ref 98–111)
Creatinine, Ser: 0.69 mg/dL (ref 0.44–1.00)
GFR, Estimated: >60 mL/min (ref >60)
Glucose, Bld: 127 mg/dL — ABNORMAL HIGH (ref 70–99)
Potassium: 3.3 mmol/L — ABNORMAL LOW (ref 3.5–5.1)
Sodium: 133 mmol/L — ABNORMAL LOW (ref 135–145)

## 2022-09-27 LAB — GLUCOSE, CAPILLARY
Glucose-Capillary: 141 mg/dL — ABNORMAL HIGH (ref 70–99)
Glucose-Capillary: 120 mg/dL — ABNORMAL HIGH (ref 70–99)
Glucose-Capillary: 134 mg/dL — ABNORMAL HIGH (ref 70–99)
Glucose-Capillary: 132 mg/dL — ABNORMAL HIGH (ref 70–99)

## 2022-09-27 MED ORDER — POTASSIUM CHLORIDE CRYS ER 20 MEQ PO TBCR
40.0000 meq | EXTENDED_RELEASE_TABLET | Freq: Once | ORAL | Status: AC
  Administered 2022-09-27: 40 meq via ORAL
  Filled 2022-09-27: qty 2

## 2022-09-27 MED ORDER — DICLOFENAC SODIUM 1 % EX GEL: 2.0000 g | Freq: Three times a day (TID) | CUTANEOUS | Status: DC | PRN

## 2022-09-27 NOTE — TOC Progression Note (Signed)
Transition of Care [facility]) - Progression Note    Patient Details  Name: Rhonda Fields MRN: [id number] Date of Birth: 31-May-1950  Transition of Care [facility]) CM/SW Contact  Maree Krabbe, LCSW Phone Number: 09/27/2022, 1:15 PM  Clinical Narrative:   CSW met with pt at bedside. Pt chooses [facility]. Auth started.   Expected Discharge Plan: Skilled Nursing Facility Barriers to Discharge: Continued Medical Work up, SNF Pending bed offer, Insurance Authorization  Expected Discharge Plan and Services       Living arrangements for the past 2 months: Skilled Nursing Facility                                       Social Determinants of Health (SDOH) Interventions SDOH Screenings   Food Insecurity: No Food Insecurity (09/22/2022)  Housing: Low Risk  (09/22/2022)  Transportation Needs: No Transportation Needs (09/22/2022)  Utilities: Not At Risk (09/22/2022)  Alcohol Screen: Low Risk  (03/06/2022)  Depression (PHQ2-9): Low Risk  (08/04/2022)  Financial Resource Strain: Low Risk  (03/06/2022)  Physical Activity: Insufficiently Active (03/06/2022)  Social Connections: Socially Isolated (03/06/2022)  Stress: No Stress Concern Present (03/06/2022)  Tobacco Use: High Risk (09/23/2022)    Readmission Risk Interventions     No data to display

## 2022-09-27 NOTE — Progress Notes (Signed)
PROGRESS NOTE    SHARREL FROCK  RJJ:[id number] DOB: 14-Apr-1950 DOA: 09/22/2022 PCP: Donita Brooks, MD   Brief Narrative:  Rhonda Fields is a 72 y.o. female with medical history significant of diabetes, GERD, hypertension, depression, anxiety, hyperlipidemia presenting after multiple falls at home.  CT of the hip showed right femur neck fracture.  Orthopedist consulted -- surgery done 9/4.   Assessment & Plan:   Principal Problem:   Displaced intertrochanteric fracture of right femur, initial encounter for closed fracture (HCC) Active Problems:   DM2 (diabetes mellitus, type 2) (HCC)   AKI (acute kidney injury) (HCC)   Leukocytosis   GERD (gastroesophageal reflux disease)   Depression   Anxiety   HTN (hypertension)   HLD (hyperlipidemia)   Hip fracture (HCC)   Displaced spiral fracture of shaft of right femur, initial encounter for closed fracture (HCC)  Right femur neck fracture Falls > Multiple falls in the last week.  Found to have fractures of the right femur neck on imaging. Orthopedic consulted: surgery 9/4.  Doing well postoperatively.  Seen by PT OT, SNF recommended.  TOC has provided her with the options and patient has just not selected her choice.  She was encouraged to do so as soon as possible.   AKI Resolved.   Acute blood loss anemia Hemoglobin dropped from 10.8 preoperatively to 7.4 postoperatively however has remained fairly stable since yesterday.  No indication for transfusion.  Monitor daily and transfuse if less than 7.   Leukocytosis Resolved.  No signs of infection.   Diabetes melitis type II: Recent hemoglobin A1c 1 month ago was 6.5.  She is on SSI and blood sugar controlled.   GERD - Continue PPI   Depression/Anxiety - Continue fluoxetine   Hyperlipidemia - Continue home atorvastatin   Hypertension -Controlled.  Continue losartan  Hypokalemia: Will replace.  Mild hyponatremia: Asymptomatic.  Monitor.  DVT  prophylaxis: enoxaparin (LOVENOX) injection 40 mg Start: 09/24/22 1200 SCDs Start: 09/23/22 1721 Place TED hose Start: 09/23/22 1721 SCDs Start: 09/22/22 1556   Code Status: Full Code  Family Communication:  None present at bedside.  Plan of care discussed with patient in length and he/she verbalized understanding and agreed with it.  Status is: Inpatient Remains inpatient appropriate because: Patient is medically stable but she needs to select a SNF choice so she can be discharged.   Estimated body mass index is 26.52 kg/m as calculated from the following:   Height as of this encounter: 5\' 2"  (1.575 m).   Weight as of this encounter: 65.8 kg.    Nutritional Assessment: Body mass index is 26.52 kg/m.Marland Kitchen Seen by dietician.  I agree with the assessment and plan as outlined below: Nutrition Status:        . Skin Assessment: I have examined the patient's skin and I agree with the wound assessment as performed by the wound care RN as outlined below:    Consultants:  Orthopedics  Procedures:  As above  Antimicrobials:  Anti-infectives (From admission, onward)    Start     Dose/Rate Route Frequency Ordered Stop   09/23/22 2000  ceFAZolin (ANCEF) IVPB 2g/100 mL premix        2 g 200 mL/hr over 30 Minutes Intravenous Every 6 hours 09/23/22 1720 09/24/22 1155   09/23/22 1230  ceFAZolin (ANCEF) IVPB 2g/100 mL premix        2 g 200 mL/hr over 30 Minutes Intravenous On call to O.R. 09/23/22 1216 09/23/22 1448  09/23/22 1149  ceFAZolin (ANCEF) 2-4 GM/100ML-% IVPB       Note to Pharmacy: Effie Shy: cabinet override      09/23/22 1149 09/23/22 1438         Subjective: Patient seen and examined.  Other than mild pain in the right hip, no other complaint.  Objective: Vitals:   09/26/22 0736 09/26/22 1426 09/26/22 1925 09/27/22 0724  BP: (!) 117/59 105/63 134/72 116/79  Pulse: 92 (!) 107 94 (!) 105  Resp:    20  Temp: 98.4 F (36.9 C) 98.4 F (36.9 C) 98.9 F (37.2  C) 97.8 F (36.6 C)  TempSrc: Oral Oral  Oral  SpO2: 95% 90% 90% 94%  Weight:      Height:        Intake/Output Summary (Last 24 hours) at 09/27/2022 0819 Last data filed at 09/27/2022 0200 Gross per 24 hour  Intake --  Output 800 ml  Net -800 ml   Filed Weights   09/22/22 1033 09/23/22 1214  Weight: 65.4 kg 65.8 kg    Examination:  General exam: Appears calm and comfortable  Respiratory system: Clear to auscultation. Respiratory effort normal. Cardiovascular system: S1 & S2 heard, RRR. No JVD, murmurs, rubs, gallops or clicks. No pedal edema. Gastrointestinal system: Abdomen is nondistended, soft and nontender. No organomegaly or masses felt. Normal bowel sounds heard. Central nervous system: Alert and oriented. No focal neurological deficits. Skin: No rashes, lesions or ulcers.  Psychiatry: Judgement and insight appear normal. Mood & affect appropriate.   Data Reviewed: I have personally reviewed following labs and imaging studies  CBC: Recent Labs  Lab 09/22/22 1101 09/23/22 0813 09/23/22 1750 09/25/22 0921 09/26/22 0803 09/27/22 0440  WBC 17.3* 8.7 17.5* 11.2* 11.0* 10.1  NEUTROABS 15.7*  --   --   --   --  7.9*  HGB 10.8* 9.9* 9.7* 7.7* 7.7* 7.4*  HCT 33.3* 30.3* 29.6* 22.4* 22.7* 21.9*  MCV 89.5 91.0 91.6 88.9 87.3 89.0  PLT 346 288 311 295 314 302   Basic Metabolic Panel: Recent Labs  Lab 09/22/22 1101 09/23/22 0813 09/23/22 1750 09/25/22 0921 09/26/22 0803 09/27/22 0440  NA 132* 136  --  130* 131* 133*  K 4.8 4.3  --  3.6 3.6 3.3*  CL 100 106  --  102 98 97*  CO2 12* 16*  --  21* 22 26  GLUCOSE 105* 110*  --  116* 137* 127*  BUN 39* 25*  --  10 8 7*  CREATININE 1.50* 0.89 0.82 0.69 0.64 0.69  CALCIUM 9.0 8.6*  --  7.7* 7.9* 8.1*   GFR: Estimated Creatinine Clearance: 57.4 mL/min (by C-G formula based on SCr of 0.69 mg/dL). Liver Function Tests: No results for input(s): "AST", "ALT", "ALKPHOS", "BILITOT", "PROT", "ALBUMIN" in the last 168  hours. No results for input(s): "LIPASE", "AMYLASE" in the last 168 hours. No results for input(s): "AMMONIA" in the last 168 hours. Coagulation Profile: No results for input(s): "INR", "PROTIME" in the last 168 hours. Cardiac Enzymes: Recent Labs  Lab 09/22/22 1101  CKTOTAL 297*   BNP (last 3 results) No results for input(s): "PROBNP" in the last 8760 hours. HbA1C: No results for input(s): "HGBA1C" in the last 72 hours. CBG: Recent Labs  Lab 09/26/22 0738 09/26/22 1138 09/26/22 1610 09/26/22 2137 09/27/22 0721  GLUCAP 136* 138* 143* 129* 141*   Lipid Profile: No results for input(s): "CHOL", "HDL", "LDLCALC", "TRIG", "CHOLHDL", "LDLDIRECT" in the last 72 hours. Thyroid Function Tests:  No results for input(s): "TSH", "T4TOTAL", "FREET4", "T3FREE", "THYROIDAB" in the last 72 hours. Anemia Panel: No results for input(s): "VITAMINB12", "FOLATE", "FERRITIN", "TIBC", "IRON", "RETICCTPCT" in the last 72 hours. Sepsis Labs: Recent Labs  Lab 09/22/22 1635 09/23/22 0813  PROCALCITON 2.72 1.59    Recent Results (from the past 240 hour(s))  Surgical pcr screen     Status: None   Collection Time: 09/23/22  9:27 AM   Specimen: Nasal Mucosa; Nasal Swab  Result Value Ref Range Status   MRSA, PCR NEGATIVE NEGATIVE Final   Staphylococcus aureus NEGATIVE NEGATIVE Final    Comment: (NOTE) The Xpert SA Assay (FDA approved for NASAL specimens in patients 48 years of age and older), is one component of a comprehensive surveillance program. It is not intended to diagnose infection nor to guide or monitor treatment. Performed at [facility], [address] [place], [place] [address]      Radiology Studies: No results found.  Scheduled Meds:  atorvastatin  40 mg Oral Daily   diclofenac Sodium  2 g Topical QID   docusate sodium  100 mg Oral BID   enoxaparin (LOVENOX) injection  40 mg Subcutaneous Q24H   FLUoxetine  20 mg Oral Daily   insulin aspart  0-9 Units  Subcutaneous TID WC   losartan  25 mg Oral Daily   oxybutynin  10 mg Oral BID   pantoprazole  40 mg Oral BID   potassium chloride  40 mEq Oral Once   Continuous Infusions:  sodium chloride Stopped (09/24/22 0308)   methocarbamol (ROBAXIN) IV       LOS: 4 days   Hughie Closs, MD [facility]  09/27/2022, 8:19 AM   *Please note that this is a verbal dictation therefore any spelling or grammatical errors are due to the "Dragon Medical One" system interpretation.  Please page via Amion and do not message via secure chat for urgent patient care matters. Secure chat can be used for non urgent patient care matters.  How to contact the [facility] Attending or Consulting provider 7A - 7P or covering provider during after hours 7P -7A, for this patient?  Check the care team in [facility] and look for a) attending/consulting TRH provider listed and b) the [facility] team listed. Page or secure chat 7A-7P. Log into [email or web address] and use [place]'s universal password to access. If you do not have the password, please contact the hospital operator. Locate the [facility] provider you are looking for under [facility] and page to a number that you can be directly reached. If you still have difficulty reaching the provider, please page the [facility] (Director on Call) for the Hospitalists listed on amion for assistance.

## 2022-09-27 NOTE — Plan of Care (Signed)
  Problem: Coping: Goal: Ability to adjust to condition or change in health will improve Outcome: Progressing   Problem: Fluid Volume: Goal: Ability to maintain a balanced intake and output will improve Outcome: Progressing   Problem: Health Behavior/Discharge Planning: Goal: Ability to identify and utilize available resources and services will improve Outcome: Progressing Goal: Ability to manage health-related needs will improve Outcome: Progressing   Problem: Metabolic: Goal: Ability to maintain appropriate glucose levels will improve Outcome: Progressing   Problem: Coping: Goal: Ability to adjust to condition or change in health will improve Outcome: Progressing   Problem: Fluid Volume: Goal: Ability to maintain a balanced intake and output will improve Outcome: Progressing   Problem: Health Behavior/Discharge Planning: Goal: Ability to identify and utilize available resources and services will improve Outcome: Progressing Goal: Ability to manage health-related needs will improve Outcome: Progressing   Problem: Metabolic: Goal: Ability to maintain appropriate glucose levels will improve Outcome: Progressing

## 2022-09-28 DIAGNOSIS — N390 Urinary tract infection, site not specified: Secondary | ICD-10-CM | POA: Diagnosis not present

## 2022-09-28 DIAGNOSIS — N3000 Acute cystitis without hematuria: Secondary | ICD-10-CM | POA: Diagnosis not present

## 2022-09-28 DIAGNOSIS — R296 Repeated falls: Secondary | ICD-10-CM | POA: Diagnosis not present

## 2022-09-28 DIAGNOSIS — E119 Type 2 diabetes mellitus without complications: Secondary | ICD-10-CM | POA: Diagnosis present

## 2022-09-28 DIAGNOSIS — R195 Other fecal abnormalities: Secondary | ICD-10-CM | POA: Diagnosis not present

## 2022-09-28 DIAGNOSIS — E860 Dehydration: Secondary | ICD-10-CM | POA: Diagnosis present

## 2022-09-28 DIAGNOSIS — K56609 Unspecified intestinal obstruction, unspecified as to partial versus complete obstruction: Secondary | ICD-10-CM | POA: Diagnosis not present

## 2022-09-28 DIAGNOSIS — K5909 Other constipation: Secondary | ICD-10-CM | POA: Diagnosis not present

## 2022-09-28 DIAGNOSIS — Z7983 Long term (current) use of bisphosphonates: Secondary | ICD-10-CM | POA: Diagnosis not present

## 2022-09-28 DIAGNOSIS — R0902 Hypoxemia: Secondary | ICD-10-CM | POA: Diagnosis not present

## 2022-09-28 DIAGNOSIS — R140 Abdominal distension (gaseous): Secondary | ICD-10-CM | POA: Diagnosis not present

## 2022-09-28 DIAGNOSIS — Z90710 Acquired absence of both cervix and uterus: Secondary | ICD-10-CM | POA: Diagnosis not present

## 2022-09-28 DIAGNOSIS — D72829 Elevated white blood cell count, unspecified: Secondary | ICD-10-CM | POA: Diagnosis not present

## 2022-09-28 DIAGNOSIS — F17200 Nicotine dependence, unspecified, uncomplicated: Secondary | ICD-10-CM | POA: Diagnosis not present

## 2022-09-28 DIAGNOSIS — R350 Frequency of micturition: Secondary | ICD-10-CM | POA: Diagnosis not present

## 2022-09-28 DIAGNOSIS — Z7982 Long term (current) use of aspirin: Secondary | ICD-10-CM | POA: Diagnosis not present

## 2022-09-28 DIAGNOSIS — E785 Hyperlipidemia, unspecified: Secondary | ICD-10-CM | POA: Diagnosis present

## 2022-09-28 DIAGNOSIS — R112 Nausea with vomiting, unspecified: Secondary | ICD-10-CM | POA: Diagnosis not present

## 2022-09-28 DIAGNOSIS — R110 Nausea: Secondary | ICD-10-CM | POA: Diagnosis not present

## 2022-09-28 DIAGNOSIS — R0689 Other abnormalities of breathing: Secondary | ICD-10-CM | POA: Diagnosis not present

## 2022-09-28 DIAGNOSIS — Z886 Allergy status to analgesic agent status: Secondary | ICD-10-CM | POA: Diagnosis not present

## 2022-09-28 DIAGNOSIS — F17210 Nicotine dependence, cigarettes, uncomplicated: Secondary | ICD-10-CM | POA: Diagnosis present

## 2022-09-28 DIAGNOSIS — Z560 Unemployment, unspecified: Secondary | ICD-10-CM | POA: Diagnosis not present

## 2022-09-28 DIAGNOSIS — Z880 Allergy status to penicillin: Secondary | ICD-10-CM | POA: Diagnosis not present

## 2022-09-28 DIAGNOSIS — R000 Tachycardia, unspecified: Secondary | ICD-10-CM | POA: Diagnosis not present

## 2022-09-28 DIAGNOSIS — E871 Hypo-osmolality and hyponatremia: Secondary | ICD-10-CM | POA: Diagnosis present

## 2022-09-28 DIAGNOSIS — M25551 Pain in right hip: Secondary | ICD-10-CM | POA: Diagnosis not present

## 2022-09-28 DIAGNOSIS — D649 Anemia, unspecified: Secondary | ICD-10-CM | POA: Diagnosis not present

## 2022-09-28 DIAGNOSIS — K219 Gastro-esophageal reflux disease without esophagitis: Secondary | ICD-10-CM | POA: Diagnosis present

## 2022-09-28 DIAGNOSIS — S79929A Unspecified injury of unspecified thigh, initial encounter: Secondary | ICD-10-CM | POA: Diagnosis not present

## 2022-09-28 DIAGNOSIS — Z833 Family history of diabetes mellitus: Secondary | ICD-10-CM | POA: Diagnosis not present

## 2022-09-28 DIAGNOSIS — D500 Iron deficiency anemia secondary to blood loss (chronic): Secondary | ICD-10-CM | POA: Diagnosis not present

## 2022-09-28 DIAGNOSIS — Z91041 Radiographic dye allergy status: Secondary | ICD-10-CM | POA: Diagnosis not present

## 2022-09-28 DIAGNOSIS — Z79899 Other long term (current) drug therapy: Secondary | ICD-10-CM | POA: Diagnosis not present

## 2022-09-28 DIAGNOSIS — K436 Other and unspecified ventral hernia with obstruction, without gangrene: Secondary | ICD-10-CM | POA: Diagnosis present

## 2022-09-28 DIAGNOSIS — K420 Umbilical hernia with obstruction, without gangrene: Secondary | ICD-10-CM | POA: Diagnosis present

## 2022-09-28 DIAGNOSIS — F419 Anxiety disorder, unspecified: Secondary | ICD-10-CM | POA: Diagnosis present

## 2022-09-28 DIAGNOSIS — S72341D Displaced spiral fracture of shaft of right femur, subsequent encounter for closed fracture with routine healing: Secondary | ICD-10-CM | POA: Diagnosis not present

## 2022-09-28 DIAGNOSIS — K430 Incisional hernia with obstruction, without gangrene: Secondary | ICD-10-CM | POA: Diagnosis not present

## 2022-09-28 DIAGNOSIS — F32A Depression, unspecified: Secondary | ICD-10-CM | POA: Diagnosis present

## 2022-09-28 DIAGNOSIS — K439 Ventral hernia without obstruction or gangrene: Secondary | ICD-10-CM | POA: Diagnosis not present

## 2022-09-28 DIAGNOSIS — I10 Essential (primary) hypertension: Secondary | ICD-10-CM | POA: Diagnosis present

## 2022-09-28 DIAGNOSIS — S72141A Displaced intertrochanteric fracture of right femur, initial encounter for closed fracture: Secondary | ICD-10-CM | POA: Diagnosis not present

## 2022-09-28 DIAGNOSIS — Z841 Family history of disorders of kidney and ureter: Secondary | ICD-10-CM | POA: Diagnosis not present

## 2022-09-28 DIAGNOSIS — D62 Acute posthemorrhagic anemia: Secondary | ICD-10-CM | POA: Diagnosis not present

## 2022-09-28 DIAGNOSIS — Z4682 Encounter for fitting and adjustment of non-vascular catheter: Secondary | ICD-10-CM | POA: Diagnosis not present

## 2022-09-28 DIAGNOSIS — K5730 Diverticulosis of large intestine without perforation or abscess without bleeding: Secondary | ICD-10-CM | POA: Diagnosis not present

## 2022-09-28 DIAGNOSIS — S72141S Displaced intertrochanteric fracture of right femur, sequela: Secondary | ICD-10-CM | POA: Diagnosis not present

## 2022-09-28 DIAGNOSIS — Z7401 Bed confinement status: Secondary | ICD-10-CM | POA: Diagnosis not present

## 2022-09-28 DIAGNOSIS — S72141D Displaced intertrochanteric fracture of right femur, subsequent encounter for closed fracture with routine healing: Secondary | ICD-10-CM | POA: Diagnosis not present

## 2022-09-28 DIAGNOSIS — Z7984 Long term (current) use of oral hypoglycemic drugs: Secondary | ICD-10-CM | POA: Diagnosis not present

## 2022-09-28 LAB — CBC
HCT: 23.5 % — ABNORMAL LOW (ref 36.0–46.0)
Hemoglobin: 7.9 g/dL — ABNORMAL LOW (ref 12.0–15.0)
MCH: 29.9 pg (ref 26.0–34.0)
MCHC: 33.6 g/dL (ref 30.0–36.0)
MCV: 89 fL (ref 80.0–100.0)
Platelets: 476 10*3/uL — ABNORMAL HIGH (ref 150–400)
RBC: 2.64 MIL/uL — ABNORMAL LOW (ref 3.87–5.11)
RDW: 15.3 % (ref 11.5–15.5)
WBC: 11.5 10*3/uL — ABNORMAL HIGH (ref 4.0–10.5)
nRBC: 0.6 % — ABNORMAL HIGH (ref 0.0–0.2)

## 2022-09-28 LAB — BASIC METABOLIC PANEL
Anion gap: 8 (ref 5–15)
BUN: 8 mg/dL (ref 8–23)
CO2: 27 mmol/L (ref 22–32)
Calcium: 8.1 mg/dL — ABNORMAL LOW (ref 8.9–10.3)
Chloride: 96 mmol/L — ABNORMAL LOW (ref 98–111)
Creatinine, Ser: 0.63 mg/dL (ref 0.44–1.00)
GFR, Estimated: >60 mL/min (ref >60)
Glucose, Bld: 121 mg/dL — ABNORMAL HIGH (ref 70–99)
Potassium: 3.8 mmol/L (ref 3.5–5.1)
Sodium: 131 mmol/L — ABNORMAL LOW (ref 135–145)

## 2022-09-28 LAB — GLUCOSE, CAPILLARY
Glucose-Capillary: 120 mg/dL — ABNORMAL HIGH (ref 70–99)
Glucose-Capillary: 109 mg/dL — ABNORMAL HIGH (ref 70–99)

## 2022-09-28 LAB — MAGNESIUM: Magnesium: 1.4 mg/dL — ABNORMAL LOW (ref 1.7–2.4)

## 2022-09-28 MED ORDER — HYDRALAZINE HCL 20 MG/ML IJ SOLN: 10.0000 mg | INTRAMUSCULAR | Status: DC | PRN

## 2022-09-28 MED ORDER — GUAIFENESIN 100 MG/5ML PO LIQD: 5.0000 mL | ORAL | Status: DC | PRN

## 2022-09-28 MED ORDER — TRAZODONE HCL 50 MG PO TABS: 50.0000 mg | ORAL_TABLET | Freq: Every evening | ORAL | Status: DC | PRN

## 2022-09-28 MED ORDER — METOPROLOL TARTRATE 5 MG/5ML IV SOLN: 5.0000 mg | INTRAVENOUS | Status: DC | PRN

## 2022-09-28 MED ORDER — IPRATROPIUM-ALBUTEROL 0.5-2.5 (3) MG/3ML IN SOLN: 3.0000 mL | RESPIRATORY_TRACT | Status: DC | PRN

## 2022-09-28 MED ORDER — DOCUSATE SODIUM 100 MG PO CAPS: 100.0000 mg | ORAL_CAPSULE | Freq: Two times a day (BID) | ORAL | Status: AC | PRN

## 2022-09-28 MED ORDER — POLYETHYLENE GLYCOL 3350 17 G PO PACK: 17.0000 g | PACK | Freq: Every day | ORAL | Status: AC | PRN

## 2022-09-28 NOTE — Progress Notes (Signed)
AVS, signed printed prescriptions, and social worker's paperwork placed in discharge packet. Report called and given to [facility], LPN at [facility]. All questions answered to satisfaction. [facility] to transport pt with all belongings.

## 2022-09-28 NOTE — Hospital Course (Addendum)
  Brief Narrative:  Rhonda Fields is a 71 y.o. female with medical history significant of diabetes, GERD, hypertension, depression, anxiety, hyperlipidemia presenting after multiple falls at home.  CT of the hip showed right femur neck fracture.  Orthopedist consulted -- surgery done 9/4.  Post Op doing well. Needs to go to SNF.    Assessment & Plan:   Principal Problem:   Displaced intertrochanteric fracture of right femur, initial encounter for closed fracture (HCC) Active Problems:   DM2 (diabetes mellitus, type 2) (HCC)   AKI (acute kidney injury) (HCC)   Leukocytosis   GERD (gastroesophageal reflux disease)   Depression   Anxiety   HTN (hypertension)   HLD (hyperlipidemia)   Hip fracture (HCC)   Displaced spiral fracture of shaft of right femur, initial encounter for closed fracture (HCC)   Right femur neck fracture status post ORIF 9/4 Falls -Secondary to falls.  Underwent ORIF by orthopedic on 9/4.  PT recommending SNF, awaiting placement.  Postop weightbearing, VTE prophylaxis, pain control, wound care and follow-up by orthopedic.  Per Ortho: Weightbearing: PWB 25%  RLE Insicional and dressing care: Reinforce dressings as needed Orthopedic device(s): None Showering: light shower 5 days after surgery VTE prophylaxis: Lovenox 40mg  qd  2 weeks Pain control: oxycodone 5 mg prn Follow - up plan: 2 weeks  AKI Resolved.   Acute blood loss anemia Hemoglobin dropped from 10.8 preoperatively to 7.4 postoperatively however has remained fairly stable since yesterday.  No indication for transfusion.  Monitor daily and transfuse if less than 7.   Leukocytosis Resolved.  No signs of infection.   Diabetes melitis type II: -A1c 6.5.  Sliding scale and Accu-Cheks  GERD -PPI   Depression/Anxiety -Fluoxetine   Hyperlipidemia -Lipitor   Hypertension -Losartan.  IV as needed   Hypokalemia -As needed repletion   DVT prophylaxis: enoxaparin    Code Status: Full  Code  Family Communication:  None  Pending SNF placement.

## 2022-09-28 NOTE — TOC Transition Note (Signed)
Transition of Care [facility]) - CM/SW Discharge Note   Patient Details  Name: Rhonda Fields MRN: [id number] Date of Birth: 10-09-1950  [facility]) CM/SW Contact:  Lorri Frederick, LCSW Phone Number: 09/28/2022, 11:33 AM   Clinical Narrative:   Pt discharging to [facility], [place], room 601A.  RN call report to [phone].     Final next level of care: Skilled Nursing Facility Barriers to Discharge: Barriers Resolved   Patient Goals and CMS Choice CMS Medicare.gov Compare Post Acute Care list provided to:: Patient Choice offered to / list presented to : Patient  Discharge Placement                Patient chooses bed at:  ([facility]) Patient to be transferred to facility by: [facility] Name of family member notified: left message with daughter Roderic Scarce, spoke with friend Corrie Dandy Patient and family notified of of transfer: 09/28/22  Discharge Plan and Services Additional resources added to the After Visit Summary for                                       Social Determinants of Health (SDOH) Interventions SDOH Screenings   Food Insecurity: No Food Insecurity (09/22/2022)  Housing: Low Risk  (09/22/2022)  Transportation Needs: No Transportation Needs (09/22/2022)  Utilities: Not At Risk (09/22/2022)  Alcohol Screen: Low Risk  (03/06/2022)  Depression (PHQ2-9): Low Risk  (08/04/2022)  Financial Resource Strain: Low Risk  (03/06/2022)  Physical Activity: Insufficiently Active (03/06/2022)  Social Connections: Socially Isolated (03/06/2022)  Stress: No Stress Concern Present (03/06/2022)  Tobacco Use: High Risk (09/23/2022)     Readmission Risk Interventions     No data to display

## 2022-09-28 NOTE — TOC Progression Note (Addendum)
Transition of Care [facility]) - Progression Note    Patient Details  Name: Rhonda Fields MRN: [id number] Date of Birth: 01-04-1951  Transition of Care [facility]) CM/SW Contact  Lorri Frederick, LCSW Phone Number: 09/28/2022, 8:26 AM  Clinical Narrative:   SNF [facility] approved in [place]: [id number], [id number], 3 days: 9/9-9/11.  MD notified.  0930: [facility] confirmed [facility] can receive pt today.    Expected Discharge Plan: Skilled Nursing Facility Barriers to Discharge: Continued Medical Work up, SNF Pending bed offer, Insurance Authorization  Expected Discharge Plan and Services       Living arrangements for the past 2 months: Skilled Nursing Facility                                       Social Determinants of Health (SDOH) Interventions SDOH Screenings   Food Insecurity: No Food Insecurity (09/22/2022)  Housing: Low Risk  (09/22/2022)  Transportation Needs: No Transportation Needs (09/22/2022)  Utilities: Not At Risk (09/22/2022)  Alcohol Screen: Low Risk  (03/06/2022)  Depression (PHQ2-9): Low Risk  (08/04/2022)  Financial Resource Strain: Low Risk  (03/06/2022)  Physical Activity: Insufficiently Active (03/06/2022)  Social Connections: Socially Isolated (03/06/2022)  Stress: No Stress Concern Present (03/06/2022)  Tobacco Use: High Risk (09/23/2022)    Readmission Risk Interventions     No data to display

## 2022-09-28 NOTE — Discharge Summary (Signed)
Physician Discharge Summary  Rhonda Fields:[id number] DOB: 19-Jul-1950 DOA: 09/22/2022  PCP: Donita Brooks, MD  Admit date: 09/22/2022 Discharge date: 09/28/2022  Admitted From: Home Disposition:  SNF  Recommendations for Outpatient Follow-up:  Follow up with PCP in 1-2 weeks Please obtain BMP/CBC in one week your next doctors visit.  Outpatient ortho follow up  Discharge Condition: Stable CODE STATUS: Full Diet recommendation: Diabetic    Brief Narrative:  Rhonda Fields is a 72 y.o. female with medical history significant of diabetes, GERD, hypertension, depression, anxiety, hyperlipidemia presenting after multiple falls at home.  CT of the hip showed right femur neck fracture.  Orthopedist consulted -- surgery done 9/4.  Post Op doing well. Needs to go to SNF.    Assessment & Plan:   Principal Problem:   Displaced intertrochanteric fracture of right femur, initial encounter for closed fracture (HCC) Active Problems:   DM2 (diabetes mellitus, type 2) (HCC)   AKI (acute kidney injury) (HCC)   Leukocytosis   GERD (gastroesophageal reflux disease)   Depression   Anxiety   HTN (hypertension)   HLD (hyperlipidemia)   Hip fracture (HCC)   Displaced spiral fracture of shaft of right femur, initial encounter for closed fracture (HCC)   Right femur neck fracture status post ORIF 9/4 Falls -Secondary to falls.  Underwent ORIF by orthopedic on 9/4.  PT recommending SNF, awaiting placement.  Postop weightbearing, VTE prophylaxis, pain control, wound care and follow-up by orthopedic.  Per Ortho: Weightbearing: PWB 25%  RLE Insicional and dressing care: Reinforce dressings as needed Orthopedic device(s): None Showering: light shower 5 days after surgery VTE prophylaxis: Lovenox 40mg  qd  2 weeks Pain control: oxycodone 5 mg prn Follow - up plan: 2 weeks  AKI Resolved.   Acute blood loss anemia Hemoglobin dropped from 10.8 preoperatively to 7.4  postoperatively however has remained fairly stable since yesterday.  No indication for transfusion.  Monitor daily and transfuse if less than 7.   Leukocytosis Resolved.  No signs of infection.   Diabetes melitis type II: -A1c 6.5.  Resume home meds  GERD -PPI   Depression/Anxiety -Fluoxetine   Hyperlipidemia -Lipitor   Hypertension -Losartan.   Hypokalemia -As needed repletion   DVT prophylaxis: enoxaparin    Code Status: Full Code  Family Communication:  None   SNF placement.     Discharge Diagnoses:  Principal Problem:   Displaced intertrochanteric fracture of right femur, initial encounter for closed fracture (HCC) Active Problems:   DM2 (diabetes mellitus, type 2) (HCC)   AKI (acute kidney injury) (HCC)   Leukocytosis   GERD (gastroesophageal reflux disease)   Depression   Anxiety   HTN (hypertension)   HLD (hyperlipidemia)   Hip fracture (HCC)   Displaced spiral fracture of shaft of right femur, initial encounter for closed fracture [facility])      Consultations: Ortho Dr Roda Shutters  Subjective: No new complaints.   Discharge Exam: Vitals:   09/28/22 0358 09/28/22 0917  BP: 130/75 (!) 144/70  Pulse: 100 92  Resp: 16 18  Temp:  98.2 F (36.8 C)  SpO2: (!) 89% 90%   Vitals:   09/27/22 1349 09/27/22 2030 09/28/22 0358 09/28/22 0917  BP: (!) 103/56 127/61 130/75 (!) 144/70  Pulse: (!) 105 88 100 92  Resp: 18 17 16 18   Temp: 98 F (36.7 C) 98 F (36.7 C)  98.2 F (36.8 C)  TempSrc: Oral   Oral  SpO2: 90% 91% (!) 89% 90%  Weight:  Height:        General: Pt is alert, awake, not in acute distress Cardiovascular: RRR, S1/S2 +, no rubs, no gallops Respiratory: CTA bilaterally, no wheezing, no rhonchi Abdominal: Soft, NT, ND, bowel sounds + Extremities: no edema, no cyanosis Surgical dressing looks ok  Discharge Instructions  Discharge Instructions     Partial weight bearing   Complete by: As directed    % Body Weight: 25   Laterality:  right   Extremity: Lower      Allergies as of 09/28/2022       Reactions   Ivp Dye [iodinated Contrast Media] Itching, Nausea And Vomiting   Mobic [meloxicam] Other (See Comments)   Lower extremity swelling   Penicillins Hives, Itching        Medication List     TAKE these medications    alendronate 70 MG tablet Commonly known as: FOSAMAX TAKE 1 TABLET EVERY 7 DAYS WITH A FULL GLASS OF WATER ON AN EMPTY STOMACH What changed: See the new instructions.   aspirin EC 81 MG tablet Take 81 mg by mouth daily.   atorvastatin 40 MG tablet Commonly known as: LIPITOR TAKE 1 TABLET EVERY DAY   CALCIUM + VITAMIN D3 PO Take 1 tablet by mouth daily.   CINNAMON PO Take 1 capsule by mouth daily.   docusate sodium 100 MG capsule Commonly known as: COLACE Take 1 capsule (100 mg total) by mouth 2 (two) times daily as needed for mild constipation.   DropSafe Alcohol Prep 70 % Pads USE AS DIRECTED PRIOR TO MONITORING BLOOD GLUCOSE UP TO THREE TIMES DAILY   enoxaparin 40 MG/0.4ML injection Commonly known as: LOVENOX Inject 0.4 mLs (40 mg total) into the skin daily for 14 days.   FISH OIL PO Take 2,000 mg by mouth daily.   FLUoxetine 20 MG capsule Commonly known as: PROZAC Take 1 capsule (20 mg total) by mouth daily.   fluticasone 50 MCG/ACT nasal spray Commonly known as: FLONASE USE 2 SPRAYS IN EACH NOSTRIL EVERY DAY What changed:  when to take this reasons to take this   glucose blood test strip Use as instructed   losartan 25 MG tablet Commonly known as: COZAAR TAKE 1 TABLET EVERY DAY   metFORMIN 1000 MG tablet Commonly known as: GLUCOPHAGE Courtesy refill. Pt need appt w/pcp for future refills.  TAKE 1 TABLET TWICE DAILY WITH MEALS   oxybutynin 5 MG tablet Commonly known as: DITROPAN TAKE 1 TABLET FOUR TIMES DAILY What changed: See the new instructions.   oxyCODONE 5 MG immediate release tablet Commonly known as: Oxy IR/ROXICODONE Take 1-2 tablets (5-10  mg total) by mouth every 8 (eight) hours as needed for severe pain.   pantoprazole 40 MG tablet Commonly known as: PROTONIX TAKE 1 TABLET TWICE DAILY   pioglitazone 30 MG tablet Commonly known as: ACTOS TAKE 1 TABLET EVERY DAY   polyethylene glycol 17 g packet Commonly known as: MIRALAX / GLYCOLAX Take 17 g by mouth daily as needed for moderate constipation or severe constipation.   triamcinolone cream 0.1 % Commonly known as: KENALOG Apply topically 2 (two) times daily. What changed:  how much to take when to take this reasons to take this   True Metrix Level 1 Low Soln Use as directed to monitor FSBS 1x daily. Dx: E11.9   True Metrix Meter w/Device Kit USE AS DIRECTED   TRUEplus Lancets 33G Misc Use as directed to monitor FSBS 1x daily. Dx: E11.9   VITAMIN C PO  Take 1 tablet by mouth daily.   ZINC PO Take 1 tablet by mouth daily.               Discharge Care Instructions  (From admission, onward)           Start     Ordered   09/26/22 0000  Partial weight bearing       Question Answer Comment  % Body Weight 25   Laterality right   Extremity Lower      09/26/22 0939            Contact information for follow-up providers     Cristie Hem, PA-C. Schedule an appointment as soon as possible for a visit in 2 week(s).   Specialty: Orthopedic Surgery Contact information: [address] [place] [address] [phone]              Contact information for after-discharge care     Destination     [facility] Preferred SNF .   Service: Skilled Nursing Contact information: [address] [place] [address] [phone]                    Allergies  Allergen Reactions   Ivp Dye [Iodinated Contrast Media] Itching and Nausea And Vomiting   Mobic [Meloxicam] Other (See Comments)    Lower extremity swelling   Penicillins Hives and Itching    You were cared for by a hospitalist  during your hospital stay. If you have any questions about your discharge medications or the care you received while you were in the hospital after you are discharged, you can call the unit and asked to speak with the hospitalist on call if the hospitalist that took care of you is not available. Once you are discharged, your primary care physician will handle any further medical issues. Please note that no refills for any discharge medications will be authorized once you are discharged, as it is imperative that you return to your primary care physician (or establish a relationship with a primary care physician if you do not have one) for your aftercare needs so that they can reassess your need for medications and monitor your lab values.  You were cared for by a hospitalist during your hospital stay. If you have any questions about your discharge medications or the care you received while you were in the hospital after you are discharged, you can call the unit and asked to speak with the hospitalist on call if the hospitalist that took care of you is not available. Once you are discharged, your primary care physician will handle any further medical issues. Please note that NO REFILLS for any discharge medications will be authorized once you are discharged, as it is imperative that you return to your primary care physician (or establish a relationship with a primary care physician if you do not have one) for your aftercare needs so that they can reassess your need for medications and monitor your lab values.  Please request your Prim.MD to go over all Hospital Tests and Procedure/Radiological results at the follow up, please get all Hospital records sent to your Prim MD by signing hospital release before you go home.  Get CBC, CMP, 2 view Chest X ray checked  by Primary MD during your next visit or SNF MD in 5-7 days ( we routinely change or add medications that can affect your baseline labs and fluid status,  therefore we recommend  that you get the mentioned basic workup next visit with your PCP, your PCP may decide not to get them or add new tests based on their clinical decision)  On your next visit with your primary care physician please Get Medicines reviewed and adjusted.  If you experience worsening of your admission symptoms, develop shortness of breath, life threatening emergency, suicidal or homicidal thoughts you must seek medical attention immediately by calling 911 or calling your MD immediately  if symptoms less severe.  You Must read complete instructions/literature along with all the possible adverse reactions/side effects for all the Medicines you take and that have been prescribed to you. Take any new Medicines after you have completely understood and accpet all the possible adverse reactions/side effects.   Do not drive, operate heavy machinery, perform activities at heights, swimming or participation in water activities or provide baby sitting services if your were admitted for syncope or siezures until you have seen by Primary MD or a Neurologist and advised to do so again.  Do not drive when taking Pain medications.   Procedures/Studies: DG FEMUR PORT, MIN 2 VIEWS RIGHT  Result Date: 09/23/2022 CLINICAL DATA:  [id number] History of open reduction and internal fixation (ORIF) procedure [id number] EXAM: RIGHT FEMUR PORTABLE 2 VIEW COMPARISON:  CT right hip 09/22/2022 FINDINGS: Right femoral neck fracture status post intramedullary nail fixation. No radiographic findings suggest surgical hardware complication. There is no evidence of acute distal fracture or other focal bone lesions. No hip dislocation. Knee is grossly unremarkable. Right hip subcutaneus soft tissue edema and emphysema consistent with surgical changes. Overlying skin staples. Vascular calcifications. IMPRESSION: Right femoral neck fracture status post intramedullary nail fixation. Electronically Signed   By: Tish Frederickson  M.D.   On: 09/23/2022 20:47   DG FEMUR, MIN 2 VIEWS RIGHT  Result Date: 09/23/2022 CLINICAL DATA:  Basicervical fracture of the right femoral neck. ORIF. EXAM: OPERATIVE right HIP (WITH PELVIS IF PERFORMED) 5 VIEWS TECHNIQUE: Fluoroscopic spot image(s) were submitted for interpretation post-operatively. Radiation Exposure Index (as provided by the fluoroscopic device): Dose area product 26.52 mGy COMPARISON:  CT of the right hip 09/22/2022 FINDINGS: Intraoperative images demonstrate placement of a right intramedullary femoral rod. Two distal locking screws are present. Two dynamic screws traverse the femoral neck. The fracture is reduced. IMPRESSION: ORIF of right femoral neck fracture without radiographic evidence for complication. Electronically Signed   By: Marin Roberts M.D.   On: 09/23/2022 19:44   DG C-Arm 1-60 Min-No Report  Result Date: 09/23/2022 Fluoroscopy was utilized by the requesting physician.  No radiographic interpretation.   DG C-Arm 1-60 Min-No Report  Result Date: 09/23/2022 Fluoroscopy was utilized by the requesting physician.  No radiographic interpretation.   CT HIP RIGHT WO CONTRAST  Result Date: 09/22/2022 CLINICAL DATA:  Right hip fracture after a fall. EXAM: CT OF THE RIGHT HIP WITHOUT CONTRAST TECHNIQUE: Multidetector CT imaging of the right hip was performed according to the standard protocol. Multiplanar CT image reconstructions were also generated. RADIATION DOSE REDUCTION: This exam was performed according to the departmental dose-optimization program which includes automated exposure control, adjustment of the mA and/or kV according to patient size and/or use of iterative reconstruction technique. COMPARISON:  Right hip x-rays from same day. CT abdomen pelvis dated January 01, 2022. FINDINGS: Bones/Joint/Cartilage Acute impacted basicervical fracture of the right femoral neck with significant varus angulation. Additional comminuted fracture of the superior greater  trochanter. The lesser trochanter is intact. No dislocation. Mild diffuse right hip joint space  narrowing with small marginal osteophytes. No joint effusion. Ligaments Ligaments are suboptimally evaluated by CT. Muscles and Tendons Grossly intact.  No significant muscle atrophy. Soft tissue Mild lateral hip soft tissue swelling. No fluid collection or hematoma. No soft tissue mass. IMPRESSION: 1. Acute basicervical fracture of the right femoral neck. Additional comminuted fracture of the superior greater trochanter. Electronically Signed   By: Obie Dredge M.D.   On: 09/22/2022 14:45   CT Head Wo Contrast  Result Date: 09/22/2022 CLINICAL DATA:  Head trauma, minor (Age >= 65y); Neck trauma (Age >= 65y) EXAM: CT HEAD WITHOUT CONTRAST CT CERVICAL SPINE WITHOUT CONTRAST TECHNIQUE: Multidetector CT imaging of the head and cervical spine was performed following the standard protocol without intravenous contrast. Multiplanar CT image reconstructions of the cervical spine were also generated. RADIATION DOSE REDUCTION: This exam was performed according to the departmental dose-optimization program which includes automated exposure control, adjustment of the mA and/or kV according to patient size and/or use of iterative reconstruction technique. COMPARISON:  None Available. FINDINGS: CT HEAD FINDINGS Brain: No evidence of acute infarction, hemorrhage, hydrocephalus, extra-axial collection or mass lesion/mass effect. Sequela of mild chronic microvascular ischemic change. Vascular: No hyperdense vessel or unexpected calcification. Skull: Normal. Negative for fracture or focal lesion. Sinuses/Orbits: No middle ear or mastoid effusion. Paranasal sinuses are clear. Mucosal thickening bilateral sphenoid sinuses. Orbits are unremarkable. Other: None. CT CERVICAL SPINE FINDINGS Alignment: Grade 1 anterolisthesis of C4 on C5 and C5 on C6. Skull base and vertebrae: No acute fracture. No primary bone lesion or focal pathologic  process. Soft tissues and spinal canal: No prevertebral fluid or swelling. No visible canal hematoma. Disc levels:  No evidence of high-grade spinal canal stenosis. Upper chest: Negative. Other: 1.1 cm. This is not meet size criteria for further workup. Right thyroid nodule IMPRESSION: 1. No acute intracranial abnormality. 2. No acute fracture or traumatic malalignment of the cervical spine. Electronically Signed   By: Lorenza Cambridge M.D.   On: 09/22/2022 14:16   CT Cervical Spine Wo Contrast  Result Date: 09/22/2022 CLINICAL DATA:  Head trauma, minor (Age >= 65y); Neck trauma (Age >= 65y) EXAM: CT HEAD WITHOUT CONTRAST CT CERVICAL SPINE WITHOUT CONTRAST TECHNIQUE: Multidetector CT imaging of the head and cervical spine was performed following the standard protocol without intravenous contrast. Multiplanar CT image reconstructions of the cervical spine were also generated. RADIATION DOSE REDUCTION: This exam was performed according to the departmental dose-optimization program which includes automated exposure control, adjustment of the mA and/or kV according to patient size and/or use of iterative reconstruction technique. COMPARISON:  None Available. FINDINGS: CT HEAD FINDINGS Brain: No evidence of acute infarction, hemorrhage, hydrocephalus, extra-axial collection or mass lesion/mass effect. Sequela of mild chronic microvascular ischemic change. Vascular: No hyperdense vessel or unexpected calcification. Skull: Normal. Negative for fracture or focal lesion. Sinuses/Orbits: No middle ear or mastoid effusion. Paranasal sinuses are clear. Mucosal thickening bilateral sphenoid sinuses. Orbits are unremarkable. Other: None. CT CERVICAL SPINE FINDINGS Alignment: Grade 1 anterolisthesis of C4 on C5 and C5 on C6. Skull base and vertebrae: No acute fracture. No primary bone lesion or focal pathologic process. Soft tissues and spinal canal: No prevertebral fluid or swelling. No visible canal hematoma. Disc levels:  No  evidence of high-grade spinal canal stenosis. Upper chest: Negative. Other: 1.1 cm. This is not meet size criteria for further workup. Right thyroid nodule IMPRESSION: 1. No acute intracranial abnormality. 2. No acute fracture or traumatic malalignment of the cervical spine. Electronically Signed  By: Lorenza Cambridge M.D.   On: 09/22/2022 14:16   DG Ankle Complete Right  Result Date: 09/22/2022 CLINICAL DATA:  Multiple falls, pain. EXAM: RIGHT ANKLE - COMPLETE 3+ VIEW COMPARISON:  None Available. FINDINGS: No acute ankle fracture. Normal ankle alignment, no dislocation. The ankle mortise is preserved. Talar dome is intact. 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 There is a small plantar calcaneal spur. No ankle joint effusion. Fifth metatarsal shaft fracture is only included on the lateral view, suspect this is chronic with surrounding callus but incompletely characterized. IMPRESSION: 1. No acute ankle fracture. 2. Fifth metatarsal shaft fracture is only included on the lateral view, suspect this is chronic with surrounding callus but incompletely characterized. Recommend dedicated foot radiograph. Electronically Signed   By: Narda Rutherford M.D.   On: 09/22/2022 13:40   DG Hip Unilat W or Wo Pelvis 2-3 Views Right  Result Date: 09/22/2022 CLINICAL DATA:  Right leg pain, fall. EXAM: DG HIP (WITH OR WITHOUT PELVIS) 2-3V RIGHT COMPARISON:  None Available. FINDINGS: Right proximal femur fracture is likely transcervical femoral neck fracture, although assessed on subsequent CT. This is at the femoral neck intertrochanteric junction. Mild apex lateral angulation and proximal foreshortening. The femoral head remains seated. The bony pelvis is intact, no pubic rami fractures. No pubic symphyseal or sacroiliac diastasis. IMPRESSION: Right proximal femur fracture is likely transcervical femoral neck fracture. This has been further characterized on subsequent CT.  Electronically Signed   By: Narda Rutherford M.D.   On: 09/22/2022 13:38   DG Chest 2 View  Result Date: 09/22/2022 CLINICAL DATA:  Multiple falls, pain. EXAM: CHEST - 2 VIEW COMPARISON:  Radiograph 02/16/2012 FINDINGS: Lung volumes are low. The heart is mildly enlarged. Mediastinal contours are normal. No pneumothorax or pleural effusion. Small retrocardiac hiatal hernia. No focal airspace disease. Remote left anterior rib fractures. Compression fracture of the lower thoracic vertebra, age indeterminate. IMPRESSION: 1. Low lung volumes.  No evidence of intrathoracic injury. 2. Compression fracture of a lower thoracic vertebra, age indeterminate. Recommend correlation for acute thoracic back pain. 3. Mild cardiomegaly. Small retrocardiac hiatal hernia. Electronically Signed   By: Narda Rutherford M.D.   On: 09/22/2022 13:37   DG Shoulder Left  Result Date: 09/22/2022 CLINICAL DATA:  Multiple falls, pain. EXAM: LEFT SHOULDER - 2+ VIEW COMPARISON:  None Available. FINDINGS: There is no evidence of fracture or dislocation. There is no evidence of arthropathy or other focal bone abnormality. Soft tissues are unremarkable. IMPRESSION: No fracture or dislocation of the left shoulder. Electronically Signed   By: Narda Rutherford M.D.   On: 09/22/2022 13:34     The results of significant diagnostics from this hospitalization (including imaging, microbiology, ancillary and laboratory) are listed below for reference.     Microbiology: Recent Results (from the past 240 hour(s))  Surgical pcr screen     Status: None   Collection Time: 09/23/22  9:27 AM   Specimen: Nasal Mucosa; Nasal Swab  Result Value Ref Range Status   MRSA, PCR NEGATIVE NEGATIVE Final   Staphylococcus aureus NEGATIVE NEGATIVE Final    Comment: (NOTE) The Xpert SA Assay (FDA approved for NASAL specimens in patients 20 years of age and older), is one component of a comprehensive surveillance program. It is not intended to diagnose  infection nor to guide or monitor treatment. Performed at [facility], [address] [place], [place] [address]      Labs: BNP (last  3 results) No results for input(s): "BNP" in the last 8760 hours. Basic Metabolic Panel: Recent Labs  Lab 09/22/22 1101 09/23/22 0813 09/23/22 1750 09/25/22 0921 09/26/22 0803 09/27/22 0440  NA 132* 136  --  130* 131* 133*  K 4.8 4.3  --  3.6 3.6 3.3*  CL 100 106  --  102 98 97*  CO2 12* 16*  --  21* 22 26  GLUCOSE 105* 110*  --  116* 137* 127*  BUN 39* 25*  --  10 8 7*  CREATININE 1.50* 0.89 0.82 0.69 0.64 0.69  CALCIUM 9.0 8.6*  --  7.7* 7.9* 8.1*   Liver Function Tests: No results for input(s): "AST", "ALT", "ALKPHOS", "BILITOT", "PROT", "ALBUMIN" in the last 168 hours. No results for input(s): "LIPASE", "AMYLASE" in the last 168 hours. No results for input(s): "AMMONIA" in the last 168 hours. CBC: Recent Labs  Lab 09/22/22 1101 09/23/22 0813 09/23/22 1750 09/25/22 0921 09/26/22 0803 09/27/22 0440  WBC 17.3* 8.7 17.5* 11.2* 11.0* 10.1  NEUTROABS 15.7*  --   --   --   --  7.9*  HGB 10.8* 9.9* 9.7* 7.7* 7.7* 7.4*  HCT 33.3* 30.3* 29.6* 22.4* 22.7* 21.9*  MCV 89.5 91.0 91.6 88.9 87.3 89.0  PLT 346 288 311 295 314 302   Cardiac Enzymes: Recent Labs  Lab 09/22/22 1101  CKTOTAL 297*   BNP: Invalid input(s): "POCBNP" CBG: Recent Labs  Lab 09/27/22 0721 09/27/22 1058 09/27/22 1603 09/27/22 2032 09/28/22 0723  GLUCAP 141* 120* 134* 132* 120*   D-Dimer No results for input(s): "DDIMER" in the last 72 hours. Hgb A1c No results for input(s): "HGBA1C" in the last 72 hours. Lipid Profile No results for input(s): "CHOL", "HDL", "LDLCALC", "TRIG", "CHOLHDL", "LDLDIRECT" in the last 72 hours. Thyroid function studies No results for input(s): "TSH", "T4TOTAL", "T3FREE", "THYROIDAB" in the last 72 hours.  Invalid input(s): "FREET3" Anemia work up No results for input(s): "VITAMINB12", "FOLATE", "FERRITIN", "TIBC",  "IRON", "RETICCTPCT" in the last 72 hours. Urinalysis    Component Value Date/Time   COLORURINE YELLOW 09/22/2022 1635   APPEARANCEUR CLEAR 09/22/2022 1635   LABSPEC 1.012 09/22/2022 1635   PHURINE 5.0 09/22/2022 1635   GLUCOSEU NEGATIVE 09/22/2022 1635   HGBUR NEGATIVE 09/22/2022 1635   BILIRUBINUR NEGATIVE 09/22/2022 1635   KETONESUR 20 (A) 09/22/2022 1635   PROTEINUR NEGATIVE 09/22/2022 1635   NITRITE NEGATIVE 09/22/2022 1635   LEUKOCYTESUR NEGATIVE 09/22/2022 1635   Sepsis Labs Recent Labs  Lab 09/23/22 1750 09/25/22 0921 09/26/22 0803 09/27/22 0440  WBC 17.5* 11.2* 11.0* 10.1   Microbiology Recent Results (from the past 240 hour(s))  Surgical pcr screen     Status: None   Collection Time: 09/23/22  9:27 AM   Specimen: Nasal Mucosa; Nasal Swab  Result Value Ref Range Status   MRSA, PCR NEGATIVE NEGATIVE Final   Staphylococcus aureus NEGATIVE NEGATIVE Final    Comment: (NOTE) The Xpert SA Assay (FDA approved for NASAL specimens in patients 22 years of age and older), is one component of a comprehensive surveillance program. It is not intended to diagnose infection nor to guide or monitor treatment. Performed at [facility], [address], [place], [place] [address]      Time coordinating discharge:  I have spent 35 minutes face to face with the patient and on the ward discussing the patients care, assessment, plan and disposition with other care givers. >50% of the time was devoted counseling the patient about the risks  and benefits of treatment/Discharge disposition and coordinating care.   SIGNED:   Miguel Rota, MD  [facility] 09/28/2022, 10:12 AM   If 7PM-7AM, please contact night-coverage

## 2022-09-29 DIAGNOSIS — R296 Repeated falls: Secondary | ICD-10-CM | POA: Diagnosis not present

## 2022-10-01 DIAGNOSIS — R296 Repeated falls: Secondary | ICD-10-CM | POA: Diagnosis not present

## 2022-10-01 DIAGNOSIS — E119 Type 2 diabetes mellitus without complications: Secondary | ICD-10-CM | POA: Diagnosis not present

## 2022-10-01 DIAGNOSIS — D500 Iron deficiency anemia secondary to blood loss (chronic): Secondary | ICD-10-CM | POA: Diagnosis not present

## 2022-10-01 DIAGNOSIS — S72141S Displaced intertrochanteric fracture of right femur, sequela: Secondary | ICD-10-CM | POA: Diagnosis not present

## 2022-10-02 DIAGNOSIS — D500 Iron deficiency anemia secondary to blood loss (chronic): Secondary | ICD-10-CM | POA: Diagnosis not present

## 2022-10-02 DIAGNOSIS — S72141S Displaced intertrochanteric fracture of right femur, sequela: Secondary | ICD-10-CM | POA: Diagnosis not present

## 2022-10-02 DIAGNOSIS — R350 Frequency of micturition: Secondary | ICD-10-CM | POA: Diagnosis not present

## 2022-10-02 DIAGNOSIS — D72829 Elevated white blood cell count, unspecified: Secondary | ICD-10-CM | POA: Diagnosis not present

## 2022-10-05 DIAGNOSIS — N3000 Acute cystitis without hematuria: Secondary | ICD-10-CM | POA: Diagnosis not present

## 2022-10-05 DIAGNOSIS — D500 Iron deficiency anemia secondary to blood loss (chronic): Secondary | ICD-10-CM | POA: Diagnosis not present

## 2022-10-05 DIAGNOSIS — D72829 Elevated white blood cell count, unspecified: Secondary | ICD-10-CM | POA: Diagnosis not present

## 2022-10-05 DIAGNOSIS — S72141S Displaced intertrochanteric fracture of right femur, sequela: Secondary | ICD-10-CM | POA: Diagnosis not present

## 2022-10-07 ENCOUNTER — Other Ambulatory Visit (INDEPENDENT_AMBULATORY_CARE_PROVIDER_SITE_OTHER): Payer: Medicare PPO

## 2022-10-07 ENCOUNTER — Encounter: Payer: Self-pay | Admitting: Orthopaedic Surgery

## 2022-10-07 ENCOUNTER — Ambulatory Visit (INDEPENDENT_AMBULATORY_CARE_PROVIDER_SITE_OTHER): Payer: Medicare PPO | Admitting: Physician Assistant

## 2022-10-07 DIAGNOSIS — M25551 Pain in right hip: Secondary | ICD-10-CM

## 2022-10-07 NOTE — Progress Notes (Signed)
Post-Op Visit Note   Patient: Rhonda Fields           Date of Birth: 05/03/1950           MRN: [id number] Visit Date: 10/07/2022 PCP: Donita Brooks, MD   Assessment & Plan:  Chief Complaint:  Chief Complaint  Patient presents with   Right Hip - Follow-up    IM intertroch nailing 09/23/2022   Visit Diagnoses:  1. Pain in right hip     Plan: Patient is a pleasant 72 year old female who comes in today 2 weeks status post right hip short IM nail for intertrochanteric femur fracture exchanged for long IM due to acute femoral shaft fracture which occurred during operative intervention, date of surgery 09/23/2022.  Patient has been at a skilled nursing facility since surgery.  She appears to be compliant with 25% weightbearing to the operative extremity.  She is in some discomfort mostly with muscle spasms for which she gets at night.  Examination of the right hip reveals well healed surgical incisions with staples intact.  No evidence of infection or cellulitis.  Calf soft and nontender.  She is neurovascular intact distally.  Today, staples removed and Steri-Strips applied.  She will remain 25% weightbearing to the operative extremity for another 4 weeks.  Follow-up with [place] in 4 weeks for repeat evaluation and x-rays of the right femur.  Call with concerns or questions.  Follow-Up Instructions: Return in about 4 weeks (around 11/04/2022).   Orders:  Orders Placed This Encounter  Procedures   XR FEMUR, MIN 2 VIEWS RIGHT   No orders of the defined types were placed in this encounter.   Imaging: XR FEMUR, MIN 2 VIEWS RIGHT  Result Date: 10/07/2022 X-rays demonstrate stable alignment of the fractures without hardware complication   PMFS History: Patient Active Problem List   Diagnosis Date Noted   Hip fracture (HCC) 09/23/2022   Displaced spiral fracture of shaft of right femur, initial encounter for closed fracture (HCC) 09/23/2022   Displaced intertrochanteric fracture  of right femur, initial encounter for closed fracture (HCC) 09/22/2022   AKI (acute kidney injury) (HCC) 09/22/2022   Leukocytosis 09/22/2022   GERD (gastroesophageal reflux disease) 09/22/2022   Depression 09/22/2022   Anxiety 09/22/2022   HTN (hypertension) 09/22/2022   HLD (hyperlipidemia) 09/22/2022   DM2 (diabetes mellitus, type 2) (HCC) 01/30/2013   History of total hysterectomy with removal of both tubes and ovaries 04/08/2011   Past Medical History:  Diagnosis Date   Anxiety    Arrhythmia    Chronic headaches    Conjunctivitis 10/31/2012   Depression    Diabetes mellitus without complication (HCC)    Generalized abdominal pain 04/29/2011   Generalized headaches 04/08/2011   GERD (gastroesophageal reflux disease)    Hard of hearing    IBS (irritable bowel syndrome) 01/20/1984    Family History  Adopted: Yes  Problem Relation Age of Onset   Diabetes Other        Siblings, both sets of grandparents   Liver disease Mother    Kidney disease Mother    Liver disease Brother    Malignant hyperthermia Neg Hx     Past Surgical History:  Procedure Laterality Date   ABDOMINAL HYSTERECTOMY     CHOLECYSTECTOMY     ERCP  04/20/2011   Procedure: ENDOSCOPIC RETROGRADE CHOLANGIOPANCREATOGRAPHY (ERCP);  Surgeon: Louis Meckel, MD;  Location: [facility];  Service: Endoscopy;  Laterality: N/A;  case is at 1430 in or  ERCP  04/20/2011   Procedure: ENDOSCOPIC RETROGRADE CHOLANGIOPANCREATOGRAPHY (ERCP);  Surgeon: Louis Meckel, MD;  Location: [facility];  Service: Gastroenterology;  Laterality: N/A;   INTRAMEDULLARY (IM) NAIL INTERTROCHANTERIC Right 09/23/2022   Procedure: INTRAMEDULLARY (IM) NAIL INTERTROCHANTERIC;  Surgeon: Tarry Kos, MD;  Location: [facility];  Service: Orthopedics;  Laterality: Right;   Social History   Occupational History   Occupation: Unemployed  Tobacco Use   Smoking status: Every Day    Current packs/day: 1.00    Average packs/day: 1 pack/day for 40.0 years  (40.0 ttl pk-yrs)    Types: Cigarettes   Smokeless tobacco: Never  Substance and Sexual Activity   Alcohol use: No   Drug use: No   Sexual activity: Not on file

## 2022-10-08 DIAGNOSIS — S72141S Displaced intertrochanteric fracture of right femur, sequela: Secondary | ICD-10-CM | POA: Diagnosis not present

## 2022-10-08 DIAGNOSIS — N3000 Acute cystitis without hematuria: Secondary | ICD-10-CM | POA: Diagnosis not present

## 2022-10-12 DIAGNOSIS — N3000 Acute cystitis without hematuria: Secondary | ICD-10-CM | POA: Diagnosis not present

## 2022-10-12 DIAGNOSIS — S72141S Displaced intertrochanteric fracture of right femur, sequela: Secondary | ICD-10-CM | POA: Diagnosis not present

## 2022-10-14 DIAGNOSIS — R112 Nausea with vomiting, unspecified: Secondary | ICD-10-CM | POA: Diagnosis not present

## 2022-10-14 DIAGNOSIS — K5909 Other constipation: Secondary | ICD-10-CM | POA: Diagnosis not present

## 2022-10-15 ENCOUNTER — Other Ambulatory Visit: Payer: Self-pay

## 2022-10-15 ENCOUNTER — Inpatient Hospital Stay
Admission: EM | Admit: 2022-10-15 | Discharge: 2022-10-20 | DRG: 354 | Disposition: A | Discharge status: Home Health | Payer: Medicare PPO | Attending: General Surgery | Admitting: General Surgery

## 2022-10-15 ENCOUNTER — Emergency Department: Payer: Medicare PPO

## 2022-10-15 ENCOUNTER — Inpatient Hospital Stay: Payer: Medicare PPO | Admitting: Registered Nurse

## 2022-10-15 ENCOUNTER — Encounter
Admission: EM | Disposition: A | Discharge status: Home Health | Payer: Self-pay | Source: Home / Self Care | Attending: General Surgery

## 2022-10-15 ENCOUNTER — Encounter: Payer: Self-pay | Admitting: General Surgery

## 2022-10-15 ENCOUNTER — Inpatient Hospital Stay: Payer: Medicare PPO

## 2022-10-15 DIAGNOSIS — E785 Hyperlipidemia, unspecified: Secondary | ICD-10-CM | POA: Diagnosis present

## 2022-10-15 DIAGNOSIS — E860 Dehydration: Secondary | ICD-10-CM | POA: Diagnosis present

## 2022-10-15 DIAGNOSIS — E119 Type 2 diabetes mellitus without complications: Secondary | ICD-10-CM | POA: Diagnosis not present

## 2022-10-15 DIAGNOSIS — D62 Acute posthemorrhagic anemia: Secondary | ICD-10-CM | POA: Diagnosis not present

## 2022-10-15 DIAGNOSIS — Z91041 Radiographic dye allergy status: Secondary | ICD-10-CM

## 2022-10-15 DIAGNOSIS — Z833 Family history of diabetes mellitus: Secondary | ICD-10-CM | POA: Diagnosis not present

## 2022-10-15 DIAGNOSIS — E871 Hypo-osmolality and hyponatremia: Secondary | ICD-10-CM | POA: Diagnosis present

## 2022-10-15 DIAGNOSIS — F17210 Nicotine dependence, cigarettes, uncomplicated: Secondary | ICD-10-CM | POA: Diagnosis present

## 2022-10-15 DIAGNOSIS — K420 Umbilical hernia with obstruction, without gangrene: Secondary | ICD-10-CM | POA: Diagnosis not present

## 2022-10-15 DIAGNOSIS — K439 Ventral hernia without obstruction or gangrene: Secondary | ICD-10-CM | POA: Diagnosis not present

## 2022-10-15 DIAGNOSIS — K56609 Unspecified intestinal obstruction, unspecified as to partial versus complete obstruction: Secondary | ICD-10-CM | POA: Diagnosis not present

## 2022-10-15 DIAGNOSIS — Z880 Allergy status to penicillin: Secondary | ICD-10-CM

## 2022-10-15 DIAGNOSIS — Z79899 Other long term (current) drug therapy: Secondary | ICD-10-CM | POA: Diagnosis not present

## 2022-10-15 DIAGNOSIS — F419 Anxiety disorder, unspecified: Secondary | ICD-10-CM | POA: Diagnosis present

## 2022-10-15 DIAGNOSIS — K430 Incisional hernia with obstruction, without gangrene: Secondary | ICD-10-CM | POA: Diagnosis not present

## 2022-10-15 DIAGNOSIS — Z7984 Long term (current) use of oral hypoglycemic drugs: Secondary | ICD-10-CM | POA: Diagnosis not present

## 2022-10-15 DIAGNOSIS — R110 Nausea: Secondary | ICD-10-CM | POA: Diagnosis not present

## 2022-10-15 DIAGNOSIS — Z90710 Acquired absence of both cervix and uterus: Secondary | ICD-10-CM

## 2022-10-15 DIAGNOSIS — F32A Depression, unspecified: Secondary | ICD-10-CM | POA: Diagnosis present

## 2022-10-15 DIAGNOSIS — K5730 Diverticulosis of large intestine without perforation or abscess without bleeding: Secondary | ICD-10-CM | POA: Diagnosis not present

## 2022-10-15 DIAGNOSIS — Z7983 Long term (current) use of bisphosphonates: Secondary | ICD-10-CM | POA: Diagnosis not present

## 2022-10-15 DIAGNOSIS — Z560 Unemployment, unspecified: Secondary | ICD-10-CM | POA: Diagnosis not present

## 2022-10-15 DIAGNOSIS — K5909 Other constipation: Secondary | ICD-10-CM | POA: Diagnosis not present

## 2022-10-15 DIAGNOSIS — R000 Tachycardia, unspecified: Secondary | ICD-10-CM | POA: Diagnosis not present

## 2022-10-15 DIAGNOSIS — Z886 Allergy status to analgesic agent status: Secondary | ICD-10-CM | POA: Diagnosis not present

## 2022-10-15 DIAGNOSIS — F17200 Nicotine dependence, unspecified, uncomplicated: Secondary | ICD-10-CM | POA: Diagnosis not present

## 2022-10-15 DIAGNOSIS — Z841 Family history of disorders of kidney and ureter: Secondary | ICD-10-CM | POA: Diagnosis not present

## 2022-10-15 DIAGNOSIS — Z7982 Long term (current) use of aspirin: Secondary | ICD-10-CM | POA: Diagnosis not present

## 2022-10-15 DIAGNOSIS — I10 Essential (primary) hypertension: Secondary | ICD-10-CM | POA: Diagnosis present

## 2022-10-15 DIAGNOSIS — K219 Gastro-esophageal reflux disease without esophagitis: Secondary | ICD-10-CM | POA: Diagnosis present

## 2022-10-15 DIAGNOSIS — R5381 Other malaise: Secondary | ICD-10-CM | POA: Diagnosis not present

## 2022-10-15 DIAGNOSIS — R0689 Other abnormalities of breathing: Secondary | ICD-10-CM | POA: Diagnosis not present

## 2022-10-15 DIAGNOSIS — R140 Abdominal distension (gaseous): Secondary | ICD-10-CM | POA: Diagnosis not present

## 2022-10-15 DIAGNOSIS — R112 Nausea with vomiting, unspecified: Secondary | ICD-10-CM | POA: Diagnosis not present

## 2022-10-15 DIAGNOSIS — K436 Other and unspecified ventral hernia with obstruction, without gangrene: Principal | ICD-10-CM

## 2022-10-15 DIAGNOSIS — Z4682 Encounter for fitting and adjustment of non-vascular catheter: Secondary | ICD-10-CM | POA: Diagnosis not present

## 2022-10-15 DIAGNOSIS — R0902 Hypoxemia: Secondary | ICD-10-CM | POA: Diagnosis not present

## 2022-10-15 DIAGNOSIS — H9190 Unspecified hearing loss, unspecified ear: Secondary | ICD-10-CM | POA: Diagnosis present

## 2022-10-15 HISTORY — PX: VENTRAL HERNIA REPAIR: SHX424

## 2022-10-15 LAB — CBG MONITORING, ED
Glucose-Capillary: 127 mg/dL — ABNORMAL HIGH (ref 70–99)
Glucose-Capillary: 135 mg/dL — ABNORMAL HIGH (ref 70–99)

## 2022-10-15 LAB — LACTIC ACID, PLASMA
Lactic Acid, Venous: 1.5 mmol/L (ref 0.5–1.9)
Lactic Acid, Venous: 1.1 mmol/L (ref 0.5–1.9)

## 2022-10-15 LAB — COMPREHENSIVE METABOLIC PANEL
ALT: 30 U/L (ref 0–44)
AST: 39 U/L (ref 15–41)
Albumin: 3.3 g/dL — ABNORMAL LOW (ref 3.5–5.0)
Alkaline Phosphatase: 145 U/L — ABNORMAL HIGH (ref 38–126)
Anion gap: 16 — ABNORMAL HIGH (ref 5–15)
BUN: 19 mg/dL (ref 8–23)
CO2: 23 mmol/L (ref 22–32)
Calcium: 9.1 mg/dL (ref 8.9–10.3)
Chloride: 93 mmol/L — ABNORMAL LOW (ref 98–111)
Creatinine, Ser: 0.84 mg/dL (ref 0.44–1.00)
GFR, Estimated: >60 mL/min (ref >60)
Glucose, Bld: 136 mg/dL — ABNORMAL HIGH (ref 70–99)
Potassium: 4.3 mmol/L (ref 3.5–5.1)
Sodium: 132 mmol/L — ABNORMAL LOW (ref 135–145)
Total Bilirubin: 1.1 mg/dL (ref 0.3–1.2)
Total Protein: 7.2 g/dL (ref 6.5–8.1)

## 2022-10-15 LAB — CBC WITH DIFFERENTIAL/PLATELET
Abs Immature Granulocytes: 0.06 10*3/uL (ref 0.00–0.07)
Basophils Absolute: 0 10*3/uL (ref 0.0–0.1)
Basophils Relative: 0 %
Eosinophils Absolute: 0 10*3/uL (ref 0.0–0.5)
Eosinophils Relative: 0 %
HCT: 33.9 % — ABNORMAL LOW (ref 36.0–46.0)
Hemoglobin: 11 g/dL — ABNORMAL LOW (ref 12.0–15.0)
Immature Granulocytes: 1 %
Lymphocytes Relative: 7 %
Lymphs Abs: 0.8 10*3/uL (ref 0.7–4.0)
MCH: 29.7 pg (ref 26.0–34.0)
MCHC: 32.4 g/dL (ref 30.0–36.0)
MCV: 91.6 fL (ref 80.0–100.0)
Monocytes Absolute: 0.7 10*3/uL (ref 0.1–1.0)
Monocytes Relative: 6 %
Neutro Abs: 10.4 10*3/uL — ABNORMAL HIGH (ref 1.7–7.7)
Neutrophils Relative %: 86 %
Platelets: 379 10*3/uL (ref 150–400)
RBC: 3.7 MIL/uL — ABNORMAL LOW (ref 3.87–5.11)
RDW: 15.3 % (ref 11.5–15.5)
WBC: 12 10*3/uL — ABNORMAL HIGH (ref 4.0–10.5)
nRBC: 0 % (ref 0.0–0.2)

## 2022-10-15 LAB — HEMOGLOBIN A1C
Hgb A1c MFr Bld: 5.2 % (ref 4.8–5.6)
Mean Plasma Glucose: 102.54 mg/dL

## 2022-10-15 LAB — GLUCOSE, CAPILLARY: Glucose-Capillary: 134 mg/dL — ABNORMAL HIGH (ref 70–99)

## 2022-10-15 LAB — LIPASE, BLOOD: Lipase: 26 U/L (ref 11–51)

## 2022-10-15 SURGERY — REPAIR, HERNIA, VENTRAL
Anesthesia: General

## 2022-10-15 MED ORDER — ROCURONIUM BROMIDE 100 MG/10ML IV SOLN
INTRAVENOUS | Status: DC | PRN
  Administered 2022-10-15: 50 mg via INTRAVENOUS

## 2022-10-15 MED ORDER — FENTANYL CITRATE (PF) 100 MCG/2ML IJ SOLN
INTRAMUSCULAR | Status: DC | PRN
  Administered 2022-10-15: 50 ug via INTRAVENOUS
  Administered 2022-10-15 (×2): 25 ug via INTRAVENOUS
  Administered 2022-10-15: 50 ug via INTRAVENOUS

## 2022-10-15 MED ORDER — DROPERIDOL 2.5 MG/ML IJ SOLN: 0.6250 mg | Freq: Once | INTRAMUSCULAR | Status: DC | PRN

## 2022-10-15 MED ORDER — KETAMINE HCL 50 MG/5ML IJ SOSY
PREFILLED_SYRINGE | INTRAMUSCULAR | Status: DC | PRN
Start: 2022-10-15 — End: 2022-10-15
  Administered 2022-10-15: 20 mg via INTRAVENOUS

## 2022-10-15 MED ORDER — OXYBUTYNIN CHLORIDE 5 MG PO TABS
5.0000 mg | ORAL_TABLET | Freq: Four times a day (QID) | ORAL | Status: DC
  Administered 2022-10-16 – 2022-10-20 (×18): 5 mg via ORAL
  Filled 2022-10-15 (×18): qty 1

## 2022-10-15 MED ORDER — BUPIVACAINE LIPOSOME 1.3 % IJ SUSP
INTRAMUSCULAR | Status: AC
  Filled 2022-10-15: qty 20

## 2022-10-15 MED ORDER — SODIUM CHLORIDE 0.9 % IV SOLN: INTRAVENOUS | Status: DC

## 2022-10-15 MED ORDER — PHENYLEPHRINE 80 MCG/ML (10ML) SYRINGE FOR IV PUSH (FOR BLOOD PRESSURE SUPPORT)
PREFILLED_SYRINGE | INTRAVENOUS | Status: DC | PRN
  Administered 2022-10-15 (×3): 80 ug via INTRAVENOUS

## 2022-10-15 MED ORDER — EPHEDRINE SULFATE-NACL 50-0.9 MG/10ML-% IV SOSY
PREFILLED_SYRINGE | INTRAVENOUS | Status: DC | PRN
Start: 2022-10-15 — End: 2022-10-15
  Administered 2022-10-15 (×2): 5 mg via INTRAVENOUS

## 2022-10-15 MED ORDER — LACTATED RINGERS IV SOLN: INTRAVENOUS | Status: DC

## 2022-10-15 MED ORDER — INSULIN ASPART 100 UNIT/ML IJ SOLN: 0.0000 [IU] | Freq: Every day | INTRAMUSCULAR | Status: DC

## 2022-10-15 MED ORDER — ASPIRIN 81 MG PO CHEW
81.0000 mg | CHEWABLE_TABLET | Freq: Every day | ORAL | Status: DC
  Administered 2022-10-16 – 2022-10-20 (×5): 81 mg via ORAL
  Filled 2022-10-15 (×5): qty 1

## 2022-10-15 MED ORDER — MORPHINE SULFATE (PF) 2 MG/ML IV SOLN
2.0000 mg | INTRAVENOUS | Status: DC | PRN
  Administered 2022-10-15: 4 mg via INTRAVENOUS
  Administered 2022-10-16 (×2): 2 mg via INTRAVENOUS
  Administered 2022-10-16: 4 mg via INTRAVENOUS
  Administered 2022-10-17 (×4): 2 mg via INTRAVENOUS
  Filled 2022-10-15 (×6): qty 1
  Filled 2022-10-15 (×2): qty 2

## 2022-10-15 MED ORDER — LIDOCAINE HCL (CARDIAC) PF 100 MG/5ML IV SOSY
PREFILLED_SYRINGE | INTRAVENOUS | Status: DC | PRN
  Administered 2022-10-15: 80 mg via INTRAVENOUS

## 2022-10-15 MED ORDER — OXYCODONE HCL 5 MG PO TABS
5.0000 mg | ORAL_TABLET | ORAL | Status: DC | PRN
  Administered 2022-10-17: 5 mg via ORAL
  Administered 2022-10-18 – 2022-10-20 (×8): 10 mg via ORAL
  Filled 2022-10-15 (×7): qty 2
  Filled 2022-10-15: qty 1
  Filled 2022-10-15: qty 2

## 2022-10-15 MED ORDER — SODIUM CHLORIDE 0.9 % IV BOLUS
1000.0000 mL | Freq: Once | INTRAVENOUS | Status: AC
  Administered 2022-10-15: 1000 mL via INTRAVENOUS

## 2022-10-15 MED ORDER — LIDOCAINE HCL (PF) 2 % IJ SOLN
INTRAMUSCULAR | Status: AC
  Filled 2022-10-15: qty 5

## 2022-10-15 MED ORDER — BUPIVACAINE-EPINEPHRINE (PF) 0.5% -1:200000 IJ SOLN
INTRAMUSCULAR | Status: DC | PRN
Start: 2022-10-15 — End: 2022-10-15
  Administered 2022-10-15: 10 mL

## 2022-10-15 MED ORDER — PHENYLEPHRINE 80 MCG/ML (10ML) SYRINGE FOR IV PUSH (FOR BLOOD PRESSURE SUPPORT)
PREFILLED_SYRINGE | INTRAVENOUS | Status: AC
  Filled 2022-10-15: qty 10

## 2022-10-15 MED ORDER — ROCURONIUM BROMIDE 10 MG/ML (PF) SYRINGE
PREFILLED_SYRINGE | INTRAVENOUS | Status: AC
  Filled 2022-10-15: qty 10

## 2022-10-15 MED ORDER — ACETAMINOPHEN 10 MG/ML IV SOLN
INTRAVENOUS | Status: AC
  Filled 2022-10-15: qty 100

## 2022-10-15 MED ORDER — FENTANYL CITRATE (PF) 100 MCG/2ML IJ SOLN
INTRAMUSCULAR | Status: AC
  Filled 2022-10-15: qty 2

## 2022-10-15 MED ORDER — CHLORHEXIDINE GLUCONATE 0.12 % MT SOLN
15.0000 mL | Freq: Once | OROMUCOSAL | Status: AC
  Administered 2022-10-15: 15 mL via OROMUCOSAL

## 2022-10-15 MED ORDER — ONDANSETRON HCL 4 MG/2ML IJ SOLN
4.0000 mg | Freq: Four times a day (QID) | INTRAMUSCULAR | Status: DC | PRN
  Administered 2022-10-15 (×2): 4 mg via INTRAVENOUS

## 2022-10-15 MED ORDER — ACETAMINOPHEN 500 MG PO TABS
1000.0000 mg | ORAL_TABLET | Freq: Four times a day (QID) | ORAL | Status: DC
  Administered 2022-10-16 – 2022-10-20 (×15): 1000 mg via ORAL
  Filled 2022-10-15 (×17): qty 2

## 2022-10-15 MED ORDER — CHLORHEXIDINE GLUCONATE 0.12 % MT SOLN
OROMUCOSAL | Status: AC
  Filled 2022-10-15: qty 15

## 2022-10-15 MED ORDER — ENOXAPARIN SODIUM 40 MG/0.4ML IJ SOSY
40.0000 mg | PREFILLED_SYRINGE | INTRAMUSCULAR | Status: DC
  Administered 2022-10-16 – 2022-10-20 (×5): 40 mg via SUBCUTANEOUS
  Filled 2022-10-15 (×5): qty 0.4

## 2022-10-15 MED ORDER — MIDAZOLAM HCL 2 MG/2ML IJ SOLN
INTRAMUSCULAR | Status: AC
  Filled 2022-10-15: qty 2

## 2022-10-15 MED ORDER — SODIUM CHLORIDE 0.9 % IV SOLN: 2.0000 g | INTRAVENOUS | Status: DC

## 2022-10-15 MED ORDER — BUPIVACAINE LIPOSOME 1.3 % IJ SUSP
INTRAMUSCULAR | Status: AC
  Filled 2022-10-15: qty 10

## 2022-10-15 MED ORDER — SODIUM CHLORIDE 0.9 % IV SOLN
2.0000 g | INTRAVENOUS | Status: AC
  Filled 2022-10-15: qty 2

## 2022-10-15 MED ORDER — LACTATED RINGERS IV BOLUS: 1000.0000 mL | Freq: Once | INTRAVENOUS | Status: DC

## 2022-10-15 MED ORDER — SUCCINYLCHOLINE CHLORIDE 200 MG/10ML IV SOSY
PREFILLED_SYRINGE | INTRAVENOUS | Status: DC | PRN
  Administered 2022-10-15: 120 mg via INTRAVENOUS

## 2022-10-15 MED ORDER — FENTANYL CITRATE (PF) 100 MCG/2ML IJ SOLN: 25.0000 ug | INTRAMUSCULAR | Status: DC | PRN

## 2022-10-15 MED ORDER — ONDANSETRON 4 MG PO TBDP: 4.0000 mg | ORAL_TABLET | Freq: Four times a day (QID) | ORAL | Status: DC | PRN

## 2022-10-15 MED ORDER — ONDANSETRON HCL 4 MG/2ML IJ SOLN
INTRAMUSCULAR | Status: AC
  Filled 2022-10-15: qty 2

## 2022-10-15 MED ORDER — FLUOXETINE HCL 20 MG PO CAPS
20.0000 mg | ORAL_CAPSULE | Freq: Every day | ORAL | Status: DC
  Administered 2022-10-17: 20 mg via ORAL
  Filled 2022-10-15 (×4): qty 1

## 2022-10-15 MED ORDER — ORAL CARE MOUTH RINSE: 15.0000 mL | Freq: Once | OROMUCOSAL | Status: AC

## 2022-10-15 MED ORDER — PROPOFOL 10 MG/ML IV BOLUS
INTRAVENOUS | Status: AC
  Filled 2022-10-15: qty 40

## 2022-10-15 MED ORDER — ONDANSETRON HCL 4 MG/2ML IJ SOLN
4.0000 mg | Freq: Once | INTRAMUSCULAR | Status: AC
  Administered 2022-10-15: 4 mg via INTRAVENOUS

## 2022-10-15 MED ORDER — ACETAMINOPHEN 10 MG/ML IV SOLN
INTRAVENOUS | Status: DC | PRN
  Administered 2022-10-15: 1000 mg via INTRAVENOUS

## 2022-10-15 MED ORDER — BUPIVACAINE LIPOSOME 1.3 % IJ SUSP
INTRAMUSCULAR | Status: DC | PRN
  Administered 2022-10-15: 10 mL

## 2022-10-15 MED ORDER — SODIUM CHLORIDE 0.9 % IV SOLN: INTRAVENOUS | Status: DC | PRN

## 2022-10-15 MED ORDER — BUPIVACAINE-EPINEPHRINE (PF) 0.5% -1:200000 IJ SOLN
INTRAMUSCULAR | Status: AC
  Filled 2022-10-15: qty 10

## 2022-10-15 MED ORDER — DEXTROSE 5 % IV SOLN
INTRAVENOUS | Status: DC | PRN
  Administered 2022-10-15: 2 g via INTRAVENOUS

## 2022-10-15 MED ORDER — SODIUM CHLORIDE 0.9 % IV SOLN
12.5000 mg | Freq: Four times a day (QID) | INTRAVENOUS | Status: DC | PRN
  Administered 2022-10-15: 12.5 mg via INTRAVENOUS
  Filled 2022-10-15: qty 12.5

## 2022-10-15 MED ORDER — DEXAMETHASONE SODIUM PHOSPHATE 10 MG/ML IJ SOLN
INTRAMUSCULAR | Status: AC
  Filled 2022-10-15: qty 1

## 2022-10-15 MED ORDER — INSULIN ASPART 100 UNIT/ML IJ SOLN
0.0000 [IU] | Freq: Three times a day (TID) | INTRAMUSCULAR | Status: DC
  Administered 2022-10-17 – 2022-10-19 (×3): 3 [IU] via SUBCUTANEOUS
  Filled 2022-10-15 (×3): qty 1

## 2022-10-15 MED ORDER — SUGAMMADEX SODIUM 200 MG/2ML IV SOLN
INTRAVENOUS | Status: DC | PRN
  Administered 2022-10-15: 200 mg via INTRAVENOUS

## 2022-10-15 MED ORDER — PROPOFOL 10 MG/ML IV BOLUS
INTRAVENOUS | Status: DC | PRN
  Administered 2022-10-15: 120 mg via INTRAVENOUS

## 2022-10-15 MED ORDER — DEXAMETHASONE SODIUM PHOSPHATE 10 MG/ML IJ SOLN
INTRAMUSCULAR | Status: DC | PRN
  Administered 2022-10-15: 10 mg via INTRAVENOUS

## 2022-10-15 MED ORDER — PHENYLEPHRINE HCL-NACL 20-0.9 MG/250ML-% IV SOLN
INTRAVENOUS | Status: DC | PRN
  Administered 2022-10-15: 40 ug/min via INTRAVENOUS

## 2022-10-15 MED ORDER — KETAMINE HCL 50 MG/5ML IJ SOSY
PREFILLED_SYRINGE | INTRAMUSCULAR | Status: AC
  Filled 2022-10-15: qty 5

## 2022-10-15 MED ORDER — PANTOPRAZOLE SODIUM 40 MG IV SOLR
40.0000 mg | Freq: Every day | INTRAVENOUS | Status: DC
  Administered 2022-10-15 – 2022-10-19 (×5): 40 mg via INTRAVENOUS
  Filled 2022-10-15 (×5): qty 10

## 2022-10-15 MED ORDER — ATORVASTATIN CALCIUM 20 MG PO TABS
40.0000 mg | ORAL_TABLET | Freq: Every day | ORAL | Status: DC
  Administered 2022-10-16 – 2022-10-20 (×5): 40 mg via ORAL
  Filled 2022-10-15 (×5): qty 2

## 2022-10-15 SURGICAL SUPPLY — 40 items
ADH SKN CLS APL DERMABOND .7 (GAUZE/BANDAGES/DRESSINGS) ×1
APL PRP STRL LF DISP 70% ISPRP (MISCELLANEOUS) ×1
BLADE SURG 15 STRL LF DISP TIS (BLADE) ×1 IMPLANT
BLADE SURG 15 STRL SS (BLADE) ×1
CHLORAPREP W/TINT 26 (MISCELLANEOUS) ×1 IMPLANT
DERMABOND ADVANCED .7 DNX12 (GAUZE/BANDAGES/DRESSINGS) IMPLANT
DRAPE LAPAROTOMY 100X77 ABD (DRAPES) ×1 IMPLANT
DRSG OPSITE POSTOP 4X10 (GAUZE/BANDAGES/DRESSINGS) IMPLANT
DRSG OPSITE POSTOP 4X8 (GAUZE/BANDAGES/DRESSINGS) IMPLANT
ELECT CAUTERY BLADE 6.4 (BLADE) ×1 IMPLANT
ELECT EZSTD 165MM 6.5IN (MISCELLANEOUS)
ELECT REM PT RETURN 9FT ADLT (ELECTROSURGICAL) ×1
ELECTRODE EZSTD 165MM 6.5IN (MISCELLANEOUS) IMPLANT
ELECTRODE REM PT RTRN 9FT ADLT (ELECTROSURGICAL) ×1 IMPLANT
GLOVE BIO SURGEON STRL SZ 6.5 (GLOVE) ×1 IMPLANT
GLOVE BIOGEL PI IND STRL 6.5 (GLOVE) ×1 IMPLANT
GOWN STRL REUS W/ TWL LRG LVL3 (GOWN DISPOSABLE) ×1 IMPLANT
GOWN STRL REUS W/TWL LRG LVL3 (GOWN DISPOSABLE) ×1
KIT TURNOVER KIT A (KITS) ×1 IMPLANT
LABEL OR SOLS (LABEL) ×1 IMPLANT
MANIFOLD NEPTUNE II (INSTRUMENTS) ×1 IMPLANT
MESH VENTRALEX ST 8CM LRG (Mesh General) IMPLANT
NDL HYPO 22X1.5 SAFETY MO (MISCELLANEOUS) ×1 IMPLANT
NEEDLE HYPO 22X1.5 SAFETY MO (MISCELLANEOUS) ×1 IMPLANT
NS IRRIG 500ML POUR BTL (IV SOLUTION) ×1 IMPLANT
PACK BASIN MINOR ARMC (MISCELLANEOUS) ×1 IMPLANT
SPONGE T-LAP 18X18 ~~LOC~~+RFID (SPONGE) ×1 IMPLANT
STAPLER SKIN PROX 35W (STAPLE) IMPLANT
SUT MNCRL 4-0 (SUTURE) ×1
SUT MNCRL 4-0 27XMFL (SUTURE) ×1
SUT PDS PLUS AB 0 CT-2 (SUTURE) ×1 IMPLANT
SUT PROLENE 0 CT 2 (SUTURE) ×2 IMPLANT
SUT VIC AB 2-0 SH 27 (SUTURE) ×1
SUT VIC AB 2-0 SH 27XBRD (SUTURE) ×1 IMPLANT
SUT VIC AB 3-0 SH 27 (SUTURE) ×1
SUT VIC AB 3-0 SH 27X BRD (SUTURE) IMPLANT
SUTURE MNCRL 4-0 27XMF (SUTURE) ×1 IMPLANT
SYR 10ML LL (SYRINGE) ×1 IMPLANT
TRAP FLUID SMOKE EVACUATOR (MISCELLANEOUS) ×1 IMPLANT
WATER STERILE IRR 500ML POUR (IV SOLUTION) ×1 IMPLANT

## 2022-10-15 NOTE — ED Provider Notes (Signed)
[facility] Provider Note   Event Date/Time   First MD Initiated Contact with Patient 10/15/22 0745     (approximate) History  Nausea  HPI Rhonda Fields is a 72 y.o. female who presents via EMS from [facility] complaining of nausea/vomiting for the last 2 days.  Patient states that she feels very dehydrated but denies belly pain.  Patient does endorse belly soreness from vomiting.  Patient states that she has a history of ventral hernia and has not had any worsening pain and states that it is easily reducible.  Patient denies any associated diarrhea.  Patient is concerned that she has not had any of her daily oxycodone or this over the last 2 days as he states the staff "would not give it to me because they think I am in a throw it up". ROS: Patient currently denies any vision changes, tinnitus, difficulty speaking, facial droop, sore throat, chest pain, shortness of breath, abdominal pain, diarrhea, dysuria, or weakness/numbness/paresthesias in any extremity   Physical Exam  Triage Vital Signs: ED Triage Vitals [10/15/22 0740]  Encounter Vitals Group     BP      Systolic BP Percentile      Diastolic BP Percentile      Pulse      Resp      Temp      Temp src      SpO2 98 %     Weight      Height      Head Circumference      Peak Flow      Pain Score      Pain Loc      Pain Education      Exclude from Growth Chart    Most recent vital signs: Vitals:   10/15/22 1500 10/15/22 1515  BP: (!) 91/45 110/63  Pulse: (!) 109 (!) 107  Resp: 15 16  Temp:    SpO2: 92% 93%   General: Awake, oriented x4. CV:  Good peripheral perfusion.  Resp:  Normal effort.  Abd:  No distention.  Midline ventral hernia that is easily reducible and nontender to palpation Other:  Elderly overweight Caucasian female resting comfortably in no acute distress ED Results / Procedures / Treatments  Labs (all labs ordered are listed, but only abnormal results are  displayed) Labs Reviewed  COMPREHENSIVE METABOLIC PANEL - Abnormal; Notable for the following components:      Result Value   Sodium 132 (*)    Chloride 93 (*)    Glucose, Bld 136 (*)    Albumin 3.3 (*)    Alkaline Phosphatase 145 (*)    Anion gap 16 (*)    All other components within normal limits  CBC WITH DIFFERENTIAL/PLATELET - Abnormal; Notable for the following components:   WBC 12.0 (*)    RBC 3.70 (*)    Hemoglobin 11.0 (*)    HCT 33.9 (*)    Neutro Abs 10.4 (*)    All other components within normal limits  CBG MONITORING, ED - Abnormal; Notable for the following components:   Glucose-Capillary 127 (*)    All other components within normal limits  CBG MONITORING, ED - Abnormal; Notable for the following components:   Glucose-Capillary 135 (*)    All other components within normal limits  LIPASE, BLOOD  LACTIC ACID, PLASMA  URINALYSIS, ROUTINE W REFLEX MICROSCOPIC  LACTIC ACID, PLASMA  HEMOGLOBIN A1C   RADIOLOGY ED MD interpretation: CT of the abdomen  pelvis without IV contrast interpreted independently by me and shows moderately sized medical hernia containing small bowel loop causing a small bowel obstruction as well as sigmoid diverticulosis and aortic atherosclerosis -Agree with radiology assessment Official radiology report(s): CT ABDOMEN PELVIS WO CONTRAST  Result Date: 10/15/2022 CLINICAL DATA:  Bowel obstruction suspected.  Nausea EXAM: CT ABDOMEN AND PELVIS WITHOUT CONTRAST TECHNIQUE: Multidetector CT imaging of the abdomen and pelvis was performed following the standard protocol without IV contrast. RADIATION DOSE REDUCTION: This exam was performed according to the departmental dose-optimization program which includes automated exposure control, adjustment of the mA and/or kV according to patient size and/or use of iterative reconstruction technique. COMPARISON:  01/01/2022 FINDINGS: Lower chest: No acute abnormality Hepatobiliary: No focal hepatic abnormality.   Prior cholecystectomy Pancreas: No focal abnormality or ductal dilatation. Spleen: No focal abnormality.  Normal size. Adrenals/Urinary Tract: No adrenal abnormality. No focal renal abnormality. No stones or hydronephrosis. Urinary bladder is unremarkable. Stomach/Bowel: There is an umbilical hernia containing small bowel loop. Associated dilated small bowel proximal to the hernia compatible with small bowel obstruction. Stomach unremarkable. Sigmoid diverticulosis without active diverticulitis. Vascular/Lymphatic: Aortic atherosclerosis. No evidence of aneurysm or adenopathy. Reproductive: Prior hysterectomy.  No adnexal masses. Other: No free fluid or free air. Musculoskeletal: No acute bony abnormality. IMPRESSION: Moderate-sized umbilical hernia containing a small bowel loop causing small bowel obstruction. Sigmoid diverticulosis. Aortic atherosclerosis. Electronically Signed   By: Charlett Nose M.D.   On: 10/15/2022 11:18   PROCEDURES: Critical Care performed: Yes, see critical care procedure note(s) .1-3 Lead EKG Interpretation  Performed by: Merwyn Katos, MD Authorized by: Merwyn Katos, MD     Interpretation: abnormal     ECG rate:  111   ECG rate assessment: tachycardic     Rhythm: sinus tachycardia     Ectopy: none     Conduction: normal   CRITICAL CARE Performed by: Merwyn Katos  Total critical care time: 47 minutes  Critical care time was exclusive of separately billable procedures and treating other patients.  Critical care was necessary to treat or prevent imminent or life-threatening deterioration.  Critical care was time spent personally by me on the following activities: development of treatment plan with patient and/or surrogate as well as nursing, discussions with consultants, evaluation of patient's response to treatment, examination of patient, obtaining history from patient or surrogate, ordering and performing treatments and interventions, ordering and review of  laboratory studies, ordering and review of radiographic studies, pulse oximetry and re-evaluation of patient's condition.  MEDICATIONS ORDERED IN ED: Medications  promethazine (PHENERGAN) 12.5 mg in sodium chloride 0.9 % 50 mL IVPB ( Intravenous MAR Hold 10/15/22 1240)  acetaminophen (TYLENOL) tablet 1,000 mg ( Oral Automatically Held 10/23/22 1800)  oxyCODONE (Oxy IR/ROXICODONE) immediate release tablet 5-10 mg ( Oral MAR Hold 10/15/22 1240)  morphine (PF) 2 MG/ML injection 2-4 mg ( Intravenous MAR Hold 10/15/22 1240)  ondansetron (ZOFRAN-ODT) disintegrating tablet 4 mg ( Oral See Alternative 10/15/22 1414)    Or  ondansetron (ZOFRAN) injection 4 mg (4 mg Intravenous Given 10/15/22 1414)  pantoprazole (PROTONIX) injection 40 mg ( Intravenous Automatically Held 10/23/22 2200)  insulin aspart (novoLOG) injection 0-15 Units ( Subcutaneous Automatically Held 10/23/22 1700)  insulin aspart (novoLOG) injection 0-5 Units ( Subcutaneous Automatically Held 10/23/22 2200)  lactated ringers bolus 1,000 mL ( Intravenous MAR Hold 10/15/22 1240)  cefOXitin (MEFOXIN) 2 g in sodium chloride 0.9 % 100 mL IVPB ( Intravenous MAR Hold 10/15/22 1240)  0.9 %  sodium chloride infusion ( Intravenous New Bag/Given 10/15/22 1418)  fentaNYL (SUBLIMAZE) injection 25-50 mcg (has no administration in time range)  droperidol (INAPSINE) 2.5 MG/ML injection 0.625 mg (has no administration in time range)  sodium chloride 0.9 % bolus 1,000 mL (0 mLs Intravenous Stopped 10/15/22 0918)  chlorhexidine (PERIDEX) 0.12 % solution 15 mL (15 mLs Mouth/Throat Given 10/15/22 1254)    Or  Oral care mouth rinse ( Mouth Rinse See Alternative 10/15/22 1254)  ondansetron (ZOFRAN) injection 4 mg (4 mg Intravenous Given 10/15/22 1253)   IMPRESSION / MDM / ASSESSMENT AND PLAN / ED COURSE  I reviewed the triage vital signs and the nursing notes.                             The patient is on the cardiac monitor to evaluate for evidence of arrhythmia and/or  significant heart rate changes. Patient's presentation is most consistent with acute presentation with potential threat to life or bodily function. Given History, Exam I believe patient needs labs and imaging to evaluate for SBO vs other acute abdomen. ED Workup: CBC, BMP, LFTs, CT Abdomen/Pelvis ED Findings: CT: Small Bowel Obstruction  History, Exam, and Workup show no overt evidence of mesenteric ischemia, bowel gangrene, abscess, peritonitis. ED Interventions: Analgesia. Defer ABX at this time. Consult: General Surgery Disposition: Admit   FINAL CLINICAL IMPRESSION(S) / ED DIAGNOSES   Final diagnoses:  Incarcerated ventral hernia   Rx / DC Orders   ED Discharge Orders     None      Note:  This document was prepared using Dragon voice recognition software and may include unintentional dictation errors.   Merwyn Katos, MD 10/15/22 [phone]

## 2022-10-15 NOTE — Anesthesia Preprocedure Evaluation (Signed)
Anesthesia Evaluation  Patient identified by MRN, date of birth, ID band Patient awake    Reviewed: Allergy & Precautions, NPO status , Patient's Chart, lab work & pertinent test results  History of Anesthesia Complications Negative for: history of anesthetic complications  Airway Mallampati: II  TM Distance: >3 FB Neck ROM: Full    Dental  (+) Edentulous Upper, Edentulous Lower, Dental Advidsory Given   Pulmonary neg shortness of breath, neg COPD, neg recent URI, Current Smoker and Patient abstained from smoking.   Pulmonary exam normal        Cardiovascular hypertension, Pt. on medications (-) angina (-) Past MI and (-) Cardiac Stents Normal cardiovascular exam(-) dysrhythmias (-) Valvular Problems/Murmurs     Neuro/Psych  Headaches PSYCHIATRIC DISORDERS Anxiety Depression       GI/Hepatic Neg liver ROS,GERD  Medicated and Controlled,,  Endo/Other  diabetes, Oral Hypoglycemic Agents    Renal/GU Renal disease     Musculoskeletal negative musculoskeletal ROS (+)    Abdominal   Peds  Hematology  (+) Blood dyscrasia, anemia   Anesthesia Other Findings Past Medical History: No date: Anxiety No date: Arrhythmia No date: Chronic headaches 10/31/2012: Conjunctivitis No date: Depression No date: Diabetes mellitus without complication (HCC) 04/29/2011: Generalized abdominal pain 04/08/2011: Generalized headaches No date: GERD (gastroesophageal reflux disease) No date: Hard of hearing 01/20/1984: IBS (irritable bowel syndrome)   Reproductive/Obstetrics                             Anesthesia Physical Anesthesia Plan  ASA: 3 and emergent  Anesthesia Plan: General   Post-op Pain Management:    Induction: Intravenous, Rapid sequence and Cricoid pressure planned  PONV Risk Score and Plan: 2 and Ondansetron, Dexamethasone, Treatment may vary due to age or medical condition and  Midazolam  Airway Management Planned: Oral ETT  Additional Equipment:   Intra-op Plan:   Post-operative Plan: Extubation in OR  Informed Consent: I have reviewed the patients History and Physical, chart, labs and discussed the procedure including the risks, benefits and alternatives for the proposed anesthesia with the patient or authorized representative who has indicated his/her understanding and acceptance.     Dental advisory given  Plan Discussed with: CRNA  Anesthesia Plan Comments:        Anesthesia Quick Evaluation

## 2022-10-15 NOTE — Progress Notes (Signed)
Pt admitted to unit, oriented to surroundings, fall risk protocols, call light system and belonging policy. Call bell and belongings within reach. Bed alarm activated, bed in lowest position.

## 2022-10-15 NOTE — H&P (Signed)
[facility] SURGICAL HISTORY & PHYSICAL (cpt [phone])  HISTORY OF PRESENT ILLNESS (HPI):  72 y.o. female presented to [facility] ED today for nausea/emesis and concern over care at [facility]. Patient reports she has had significant nausea, foul smelling emesis, and intermittent abdominal pain since Tuesday of this week. She was getting "something under the tongue" at [facility] without improvement. Abdominal pain primarily located near umbilicus. She denied any associated fever, chills, CP, SOB. She is passing a small amount of flatus. No BM since Tuesday. She has a known history of this umbilical hernia for a while now. She has been following with her PCP for this but it has always previously been reducible. Of note, she is currently at [facility] secondary to a right hip fracture surgically repaired at [facility] on 09/04. She was discharged to [facility] on 09/09 with 14 days of Lovenox. Otherwise, she is not on any anticoagulation. Previous intra-abdominal surgeries positive for open cholecystectomy and hysterectomy. Work up in the ED revealed a leukocytosis to 12.0K, Hgb to 11.0, sCr - 0.84, hyponatremia to 132. Venous lactate is pending. CT Abdomen/Pelvis is concerning for umbilical hernia with associated small bowel obstruction. She has also been tachycardic in the ED.   General Surgery is consulted by emergency medicine physician Dr Donna Bernard, MD   PAST MEDICAL HISTORY (PMH):  Past Medical History:  Diagnosis Date   Anxiety    Arrhythmia    Chronic headaches    Conjunctivitis 10/31/2012   Depression    Diabetes mellitus without complication (HCC)    Generalized abdominal pain 04/29/2011   Generalized headaches 04/08/2011   GERD (gastroesophageal reflux disease)    Hard of hearing    IBS (irritable bowel syndrome) 01/20/1984    Reviewed. Otherwise negative.   PAST SURGICAL HISTORY [facility]):  Past Surgical History:  Procedure Laterality Date   ABDOMINAL HYSTERECTOMY     CHOLECYSTECTOMY     ERCP   04/20/2011   Procedure: ENDOSCOPIC RETROGRADE CHOLANGIOPANCREATOGRAPHY (ERCP);  Surgeon: Louis Meckel, MD;  Location: [facility];  Service: Endoscopy;  Laterality: N/A;  case is at 1430 in or   ERCP  04/20/2011   Procedure: ENDOSCOPIC RETROGRADE CHOLANGIOPANCREATOGRAPHY (ERCP);  Surgeon: Louis Meckel, MD;  Location: [facility];  Service: Gastroenterology;  Laterality: N/A;   INTRAMEDULLARY (IM) NAIL INTERTROCHANTERIC Right 09/23/2022   Procedure: INTRAMEDULLARY (IM) NAIL INTERTROCHANTERIC;  Surgeon: Tarry Kos, MD;  Location: [facility] OR;  Service: Orthopedics;  Laterality: Right;    Reviewed. Otherwise negative.   MEDICATIONS:  Prior to Admission medications   Medication Sig Start Date End Date Taking? Authorizing Provider  Alcohol Swabs (DROPSAFE ALCOHOL PREP) 70 % PADS USE AS DIRECTED PRIOR TO MONITORING BLOOD GLUCOSE UP TO THREE TIMES DAILY 01/15/22   Donita Brooks, MD  alendronate (FOSAMAX) 70 MG tablet TAKE 1 TABLET EVERY 7 DAYS WITH A FULL GLASS OF WATER ON AN EMPTY STOMACH Patient taking differently: Take 70 mg by mouth every Thursday. 08/06/22   Donita Brooks, MD  Ascorbic Acid (VITAMIN C PO) Take 1 tablet by mouth daily.    [provider]  aspirin EC 81 MG tablet Take 81 mg by mouth daily.    [provider]  atorvastatin (LIPITOR) 40 MG tablet TAKE 1 TABLET EVERY DAY 11/03/21   Donita Brooks, MD  Blood Glucose Calibration (TRUE METRIX LEVEL 1) Low SOLN Use as directed to monitor FSBS 1x daily. Dx: E11.9 02/19/20   Donita Brooks, MD  Blood Glucose Monitoring Suppl (TRUE  METRIX METER) w/Device KIT USE AS DIRECTED 04/25/20   Donita Brooks, MD  Calcium Carb-Cholecalciferol (CALCIUM + VITAMIN D3 PO) Take 1 tablet by mouth daily.    [provider]  CINNAMON PO Take 1 capsule by mouth daily.    [provider]  docusate sodium (COLACE) 100 MG capsule Take 1 capsule (100 mg total) by mouth 2 (two) times daily as needed for mild constipation.  09/28/22   Amin, Ankit C, MD  enoxaparin (LOVENOX) 40 MG/0.4ML injection Inject 0.4 mLs (40 mg total) into the skin daily for 14 days. 09/26/22 10/10/22  Tarry Kos, MD  FLUoxetine (PROZAC) 20 MG capsule Take 1 capsule (20 mg total) by mouth daily. 08/13/22   Donita Brooks, MD  fluticasone (FLONASE) 50 MCG/ACT nasal spray USE 2 SPRAYS IN EACH NOSTRIL EVERY DAY Patient taking differently: Place 2 sprays into both nostrils daily as needed for allergies. 06/09/22   Donita Brooks, MD  glucose blood test strip Use as instructed 02/15/20   Donita Brooks, MD  losartan (COZAAR) 25 MG tablet TAKE 1 TABLET EVERY DAY 03/20/22   Donita Brooks, MD  metFORMIN (GLUCOPHAGE) 1000 MG tablet Courtesy refill. Pt need appt w/pcp for future refills.  TAKE 1 TABLET TWICE DAILY WITH MEALS 07/20/22   Donita Brooks, MD  Multiple Vitamins-Minerals (ZINC PO) Take 1 tablet by mouth daily.    [provider]  Omega-3 Fatty Acids (FISH OIL PO) Take 2,000 mg by mouth daily.    [provider]  oxybutynin (DITROPAN) 5 MG tablet TAKE 1 TABLET FOUR TIMES DAILY Patient taking differently: Take 10 mg by mouth 2 (two) times daily. 05/06/22   Donita Brooks, MD  oxyCODONE (OXY IR/ROXICODONE) 5 MG immediate release tablet Take 1-2 tablets (5-10 mg total) by mouth every 8 (eight) hours as needed for severe pain. 09/26/22   Tarry Kos, MD  pantoprazole (PROTONIX) 40 MG tablet TAKE 1 TABLET TWICE DAILY 08/28/22   Donita Brooks, MD  pioglitazone (ACTOS) 30 MG tablet TAKE 1 TABLET EVERY DAY 08/28/22   Donita Brooks, MD  polyethylene glycol (MIRALAX / GLYCOLAX) 17 g packet Take 17 g by mouth daily as needed for moderate constipation or severe constipation. 09/28/22   Amin, Ankit C, MD  triamcinolone cream (KENALOG) 0.1 % Apply topically 2 (two) times daily. Patient taking differently: Apply 1 Application topically 2 (two) times daily as needed (skin irritation, itching). 03/17/22   Donita Brooks, MD   TRUEplus Lancets 33G MISC Use as directed to monitor FSBS 1x daily. Dx: E11.9 02/15/20   Donita Brooks, MD     ALLERGIES:  Allergies  Allergen Reactions   Ivp Dye [Iodinated Contrast Media] Itching and Nausea And Vomiting   Mobic [Meloxicam] Other (See Comments)    Lower extremity swelling   Penicillins Hives and Itching     SOCIAL HISTORY:  Social History   Socioeconomic History   Marital status: Widowed    Spouse name: Not on file   Number of children: 4   Years of education: Not on file   Highest education level: Not on file  Occupational History   Occupation: Unemployed  Tobacco Use   Smoking status: Every Day    Current packs/day: 1.00    Average packs/day: 1 pack/day for 40.0 years (40.0 ttl pk-yrs)    Types: Cigarettes   Smokeless tobacco: Never  Substance and Sexual Activity   Alcohol use: No   Drug use:  No   Sexual activity: Not on file  Other Topics Concern   Not on file  Social History Narrative   Daughter lives next door.   Widowed. Was married x 37 years.   Social Determinants of Health   Financial Resource Strain: Low Risk  (03/06/2022)   Overall Financial Resource Strain (CARDIA)    Difficulty of Paying Living Expenses: Not very hard  Food Insecurity: No Food Insecurity (09/22/2022)   Hunger Vital Sign    Worried About Running Out of Food in the Last Year: Never true    Ran Out of Food in the Last Year: Never true  Transportation Needs: No Transportation Needs (09/22/2022)   PRAPARE - Administrator, Civil Service (Medical): No    Lack of Transportation (Non-Medical): No  Physical Activity: Insufficiently Active (03/06/2022)   Exercise Vital Sign    Days of Exercise per Week: 2 days    Minutes of Exercise per Session: 10 min  Stress: No Stress Concern Present (03/06/2022)   [employer] of Occupational Health - Occupational Stress Questionnaire    Feeling of Stress : Only a little  Social Connections: Socially Isolated  (03/06/2022)   Social Connection and Isolation Panel [NHANES]    Frequency of Communication with Friends and Family: More than three times a week    Frequency of Social Gatherings with Friends and Family: More than three times a week    Attends Religious Services: Never    Database administrator or Organizations: No    Attends Banker Meetings: Never    Marital Status: Widowed  Intimate Partner Violence: Not At Risk (09/22/2022)   Humiliation, Afraid, Rape, and Kick questionnaire    Fear of Current or Ex-Partner: No    Emotionally Abused: No    Physically Abused: No    Sexually Abused: No     FAMILY HISTORY:  Family History  Adopted: Yes  Problem Relation Age of Onset   Diabetes Other        Siblings, both sets of grandparents   Liver disease Mother    Kidney disease Mother    Liver disease Brother    Malignant hyperthermia Neg Hx     Otherwise negative.   REVIEW OF SYSTEMS:  Review of Systems  Constitutional:  Negative for chills and fever.  Respiratory:  Negative for cough and shortness of breath.   Cardiovascular:  Negative for chest pain and palpitations.  Gastrointestinal:  Positive for abdominal pain, nausea and vomiting.  Genitourinary:  Negative for dysuria and urgency.  All other systems reviewed and are negative.   VITAL SIGNS:  Temp:  [99.2 F (37.3 C)] 99.2 F (37.3 C) (09/26 0745) Pulse Rate:  [118] 118 (09/26 0745) BP: (104)/(75) 104/75 (09/26 0745) SpO2:  [98 %-100 %] 100 % (09/26 0745) Weight:  [62.1 kg] 62.1 kg (09/26 0747)     Height: 5\' 2"  (157.5 cm) Weight: 62.1 kg BMI (Calculated): 25.05   PHYSICAL EXAM:  Physical Exam Vitals and nursing note reviewed. Exam conducted with a chaperone present.  Constitutional:      General: She is not in acute distress.    Appearance: Normal appearance. She is not ill-appearing.     Comments: Resting in bed; anxious. Actively with emesis   HENT:     Head: Normocephalic and atraumatic.  Eyes:      General: No scleral icterus.    Conjunctiva/sclera: Conjunctivae normal.  Cardiovascular:     Rate and Rhythm: Tachycardia present.  Pulses: Normal pulses.     Heart sounds: No murmur heard. Pulmonary:     Effort: Pulmonary effort is normal. No respiratory distress.  Abdominal:     General: A surgical scar is present. There is no distension.     Palpations: Abdomen is soft.     Tenderness: There is abdominal tenderness in the periumbilical area.     Hernia: A hernia is present. Hernia is present in the umbilical area.     Comments: Abdomen is soft, tender umbilical hernia, no rebound/guarding   Genitourinary:    Comments: Deferred Musculoskeletal:     Right lower leg: No edema.     Left lower leg: No edema.  Skin:    General: Skin is warm and dry.  Neurological:     General: No focal deficit present.     Mental Status: She is alert. Mental status is at baseline.  Psychiatric:        Mood and Affect: Mood is anxious.        Behavior: Behavior normal.     INTAKE/OUTPUT:  This shift: Total I/O In: 1000 [IV Piggyback:1000] Out: -   Last 2 shifts: @IOLAST2SHIFTS @  Labs:     Latest Ref Rng & Units 10/15/2022    7:54 AM 09/28/2022    9:58 AM 09/27/2022    4:40 AM  CBC  WBC 4.0 - 10.5 K/uL 12.0  11.5  10.1   Hemoglobin 12.0 - 15.0 g/dL 57.8  7.9  7.4   Hematocrit 36.0 - 46.0 % 33.9  23.5  21.9   Platelets 150 - 400 K/uL 379  476  302       Latest Ref Rng & Units 10/15/2022    7:54 AM 09/28/2022    9:58 AM 09/27/2022    4:40 AM  CMP  Glucose 70 - 99 mg/dL 469  629  528   BUN 8 - 23 mg/dL 19  8  7    Creatinine 0.44 - 1.00 mg/dL 4.13  2.44  0.10   Sodium 135 - 145 mmol/L 132  131  133   Potassium 3.5 - 5.1 mmol/L 4.3  3.8  3.3   Chloride 98 - 111 mmol/L 93  96  97   CO2 22 - 32 mmol/L 23  27  26    Calcium 8.9 - 10.3 mg/dL 9.1  8.1  8.1   Total Protein 6.5 - 8.1 g/dL 7.2     Total Bilirubin 0.3 - 1.2 mg/dL 1.1     Alkaline Phos 38 - 126 U/L 145     AST 15 - 41 U/L 39      ALT 0 - 44 U/L 30       Imaging studies:   CT Abdomen/Pelvis (10/15/2022) personally reviewed with umbilical hernia containing loops of small bowel and associated SBO, and radiologist report reviewed below:   IMPRESSION: Moderate-sized umbilical hernia containing a small bowel loop causing small bowel obstruction.   Sigmoid diverticulosis.   Aortic atherosclerosis.   Assessment/Plan: (ICD-10's: K61.6) 72 y.o. female with incarcerated ventral/umbilical hernia with associated small bowel obstruction.    - Given this episode of incarceration and associated SBO with leukocytosis and tachycardia, we will plan to proceed more emergent ventral hernia repair with possible small bowel resection with Dr Maurine Minister pending OR/Anesthesia availability   - All risks, benefits, and alternatives to above procedure(s) were discussed with the patient, all of her questions were answered to her expressed satisfaction, patient expresses she wishes to proceed, and informed consent  was obtained.    - NPO + IVF Resuscitation  - IV Abx (Cefoxitin IV 2g) on call to OR  - She does need NGT placement; low-intermittent suction  - Monitor abdominal examination; on-going bowel function  - Pain control prn; antiemetics prn  - Follow up venous lactate  - Would appreciate medicine consultation to help with comorbidities; ordered SSI  All of the above findings and recommendations were discussed with the patient, and all of her questions were answered to her  expressed satisfaction.  -- Lynden Oxford, PA-C [facility] 10/15/2022, 11:38 AM M-F: 7am - 4pm

## 2022-10-15 NOTE — Assessment & Plan Note (Addendum)
Hold losartan for tonight, resume in a.m. if blood pressures goes above target. BP still soft. Moinotr I/o

## 2022-10-15 NOTE — Assessment & Plan Note (Signed)
Hold metformin, insulin sliding scale

## 2022-10-15 NOTE — Anesthesia Procedure Notes (Signed)
Procedure Name: Intubation Date/Time: 10/15/2022 1:10 PM  Performed by: Cheral Bay, CRNAPre-anesthesia Checklist: Patient identified, Emergency Drugs available, Suction available and Patient being monitored Patient Re-evaluated:Patient Re-evaluated prior to induction Oxygen Delivery Method: Circle system utilized Preoxygenation: Pre-oxygenation with 100% oxygen Induction Type: IV induction, Rapid sequence and Cricoid Pressure applied Laryngoscope Size: McGraph and 3 Grade View: Grade I Tube type: Oral Tube size: 7.0 mm Number of attempts: 1 Airway Equipment and Method: Stylet and Oral airway Placement Confirmation: ETT inserted through vocal cords under direct vision, positive ETCO2 and breath sounds checked- equal and bilateral Tube secured with: Tape Dental Injury: Teeth and Oropharynx as per pre-operative assessment

## 2022-10-15 NOTE — Op Note (Addendum)
Abdominal Hernia Repair  Pre-operative Diagnosis: Incarcerated Ventral Hernia  Post-operative Diagnosis: Incarcerated Ventral Hernia total size of defects 4cm and 1cm defects  Surgeon: Baker Pierini, M.D.   Anesthesia: Gen. with endotracheal tube  Assistant:Zach Manus Rudd, PA-C   Findings:  Incarcerated ventral hernia with viable bowel, reduced, hernia defect 4cm, closed primarily with intraperitoneal ventralex hernia mesh (large) used, smaller superior defect approximately 1cm closed primarily   Estimated Blood Loss: 5cs         Drains: None         Specimens: None          Complications: none              Procedure Details  The patient was seen again in the Holding Room. The benefits, complications, treatment options, and expected outcomes were discussed with the patient. The risks of bleeding, infection, recurrence of symptoms, failure to resolve symptoms, bowel injury, mesh placement, mesh infection, any of which could require further surgery were reviewed with the patient. The likelihood of improving the patient's symptoms with return to their baseline status is good.  The patient and/or family concurred with the proposed plan, giving informed consent.  The patient was taken to Operating Room, identified as Rhonda Fields and the procedure verified.  A Time Out was held and the above information confirmed.  Prior to the induction of general anesthesia, antibiotic prophylaxis was administered. VTE prophylaxis was in place. General endotracheal anesthesia was then administered and tolerated well. After the induction, the abdomen was prepped with Chloraprep and draped in the sterile fashion. The patient was positioned in the supine position.   Incision was created with a scalpel over the hernia defect. Electrocautery was used to dissect through subcutaneous tissue, the hernia sac was opened and rises of adhesion was performed with Metzenbaum's scissors. Hernia sac was  excised. The hernia was measured and the mesh was selected. The fascial edges were then cleared circumferentially. A large ventralex round hernia mesh was used. The defect was closed with 0 PDS in an interrupted fashion. The tails of the mesh were incoporated into the closure to ensure good apposition of the mesh to the abdominal wall.  WE then noted a small hernia defect superior to the repaired defect. This measured apprxoimately 1cm. The fascial edges were cleared off and this was repaired primarily with a 0 PDS suture.  Incisions were closed in a 2 layer fashion with 3-0 Vicryl and 4-0 Monocryl. Dermabond was used to coat the skin. Combination liposomal bupivicaine and marcaine was used to inject all the incision sites. Patient tolerated procedure well and there were no immediate complications. Needle and laparotomy counts were correct  Throughout the case the assistance of Laqueta Due PA-C was needed for retraction and closure  Baker Pierini, MD

## 2022-10-15 NOTE — Consult Note (Signed)
Initial Consultation Note   Patient: Rhonda Fields ZOX:[id number] DOB: [birth year] PCP: Rhonda Brooks, MD DOA: 10/15/2022 DOS: the patient was seen and examined on 10/15/2022 Primary service: Rhonda Cocking, MD  Referring physician: Dr. Baker Fields Reason for consult: medical co-management.  Assessment/Plan: Assessment and Plan: * Incarcerated ventral hernia S/p reduction with viable bowel and closure by Sx. Patient seems to be in post op ileus. On NGT. Monitor electrolytes (K and HCO3)  HTN (hypertension) Hold losartan for tonight, resume in a.m. if blood pressures goes above target. BP still soft.  DM2 (diabetes mellitus, type 2) (HCC) Hold metformin, insulin sliding scale  I have reviewed patient home med list from last DC summary 09/28/2022. Ordered relevant meds. Please make adjustments based on pharmacy med collection if any.  Pain regimen per primary team.  I will order lovenox starting AM   TRH will continue to follow the patient.  Rhonda Fields from secondary sources. Patient is in pacu at this time, still somnolent.  Rhonda Fields is a 72 y.o. female with past medical history of diabetes, GERD, hypertension, depression, anxiety, hyperlipidemia and multiple falls at home recent right femoral neck fracutrre in sept 3.  Today : Pt bib AEMS from [facility]. Pt cx of obstipation, nausea and vomting since Tuesday, no cx of abdominal pain. On exam tachycardic to 110s, abdomen is soft, but umbilical bulge non-reducible.  Patinet was diagnosed with incarcercerated ventral hernia with SBO today 10/15/2022 and taken emergenlty to OR  Findings:  Incarcerated ventral hernia with viable bowel, reduced, hernia defect 4cm, closed primarily with intraperitoneal ventralex hernia mesh (large) used, smaller superior defect approximately 1cm closed primarily   Again patient is currently in PACU with NGT in situ, abdominal binder in place and seems to be restful/not  engaging in a conversation.  NGT is connected to intermittent suction.  Per report from RN, patient has been allowed meds with sips of water by surgery.   Review of Systems: unable to review all systems due to the inability of the patient to answer questions. Past Medical History:  Diagnosis Date   Anxiety    Arrhythmia    Chronic headaches    Conjunctivitis 10/31/2012   Depression    Diabetes mellitus without complication (HCC)    Generalized abdominal pain 04/29/2011   Generalized headaches 04/08/2011   GERD (gastroesophageal reflux disease)    Hard of hearing    IBS (irritable bowel syndrome) 01/20/1984   Past Surgical History:  Procedure Laterality Date   ABDOMINAL HYSTERECTOMY     CHOLECYSTECTOMY     ERCP  04/20/2011   Procedure: ENDOSCOPIC RETROGRADE CHOLANGIOPANCREATOGRAPHY (ERCP);  Surgeon: Louis Meckel, MD;  Location: [facility];  Service: Endoscopy;  Laterality: N/A;  case is at 1430 in or   ERCP  04/20/2011   Procedure: ENDOSCOPIC RETROGRADE CHOLANGIOPANCREATOGRAPHY (ERCP);  Surgeon: Louis Meckel, MD;  Location: [facility];  Service: Gastroenterology;  Laterality: N/A;   INTRAMEDULLARY (IM) NAIL INTERTROCHANTERIC Right 09/23/2022   Procedure: INTRAMEDULLARY (IM) NAIL INTERTROCHANTERIC;  Surgeon: Tarry Kos, MD;  Location: [facility];  Service: Orthopedics;  Laterality: Right;   Social History:  reports that she has been smoking cigarettes. She has a 40 pack-year smoking history. She has never used smokeless tobacco. She reports that she does not drink alcohol and does not use drugs.  Allergies  Allergen Reactions   Ivp Dye [Iodinated Contrast Media] Itching and Nausea And Vomiting   Mobic [Meloxicam] Other (See Comments)    Lower  extremity swelling   Penicillins Hives and Itching    Family History  Adopted: Yes  Problem Relation Age of Onset   Diabetes Other        Siblings, both sets of grandparents   Liver disease Mother    Kidney disease Mother    Liver disease  Brother    Malignant hyperthermia Neg Hx     Prior to Admission medications   Medication Sig Start Date End Date Taking? Authorizing Provider  Alcohol Swabs (DROPSAFE ALCOHOL PREP) 70 % PADS USE AS DIRECTED PRIOR TO MONITORING BLOOD GLUCOSE UP TO THREE TIMES DAILY 01/15/22   Rhonda Brooks, MD  alendronate (FOSAMAX) 70 MG tablet TAKE 1 TABLET EVERY 7 DAYS WITH A FULL GLASS OF WATER ON AN EMPTY STOMACH Patient taking differently: Take 70 mg by mouth every Thursday. 08/06/22   Rhonda Brooks, MD  Ascorbic Acid (VITAMIN C PO) Take 1 tablet by mouth daily.    [provider]  aspirin EC 81 MG tablet Take 81 mg by mouth daily.    [provider]  atorvastatin (LIPITOR) 40 MG tablet TAKE 1 TABLET EVERY DAY 11/03/21   Rhonda Brooks, MD  Blood Glucose Calibration (TRUE METRIX LEVEL 1) Low SOLN Use as directed to monitor FSBS 1x daily. Dx: E11.9 02/19/20   Rhonda Brooks, MD  Blood Glucose Monitoring Suppl (TRUE METRIX METER) w/Device KIT USE AS DIRECTED 04/25/20   Rhonda Brooks, MD  Calcium Carb-Cholecalciferol (CALCIUM + VITAMIN D3 PO) Take 1 tablet by mouth daily.    [provider]  CINNAMON PO Take 1 capsule by mouth daily.    [provider]  docusate sodium (COLACE) 100 MG capsule Take 1 capsule (100 mg total) by mouth 2 (two) times daily as needed for mild constipation. 09/28/22   Amin, Ankit C, MD  enoxaparin (LOVENOX) 40 MG/0.4ML injection Inject 0.4 mLs (40 mg total) into the skin daily for 14 days. 09/26/22 10/10/22  Tarry Kos, MD  FLUoxetine (PROZAC) 20 MG capsule Take 1 capsule (20 mg total) by mouth daily. 08/13/22   Rhonda Brooks, MD  fluticasone (FLONASE) 50 MCG/ACT nasal spray USE 2 SPRAYS IN EACH NOSTRIL EVERY DAY Patient taking differently: Place 2 sprays into both nostrils daily as needed for allergies. 06/09/22   Rhonda Brooks, MD  glucose blood test strip Use as instructed 02/15/20   Rhonda Brooks, MD  losartan (COZAAR) 25  MG tablet TAKE 1 TABLET EVERY DAY 03/20/22   Rhonda Brooks, MD  metFORMIN (GLUCOPHAGE) 1000 MG tablet Courtesy refill. Pt need appt w/pcp for future refills.  TAKE 1 TABLET TWICE DAILY WITH MEALS 07/20/22   Rhonda Brooks, MD  Multiple Vitamins-Minerals (ZINC PO) Take 1 tablet by mouth daily.    [provider]  Omega-3 Fatty Acids (FISH OIL PO) Take 2,000 mg by mouth daily.    [provider]  oxybutynin (DITROPAN) 5 MG tablet TAKE 1 TABLET FOUR TIMES DAILY Patient taking differently: Take 10 mg by mouth 2 (two) times daily. 05/06/22   Rhonda Brooks, MD  oxyCODONE (OXY IR/ROXICODONE) 5 MG immediate release tablet Take 1-2 tablets (5-10 mg total) by mouth every 8 (eight) hours as needed for severe pain. 09/26/22   Tarry Kos, MD  pantoprazole (PROTONIX) 40 MG tablet TAKE 1 TABLET TWICE DAILY 08/28/22   Rhonda Brooks, MD  pioglitazone (ACTOS) 30 MG tablet TAKE 1 TABLET EVERY DAY 08/28/22   Rhonda Brooks,  MD  polyethylene glycol (MIRALAX / GLYCOLAX) 17 g packet Take 17 g by mouth daily as needed for moderate constipation or severe constipation. 09/28/22   Amin, Ankit C, MD  triamcinolone cream (KENALOG) 0.1 % Apply topically 2 (two) times daily. Patient taking differently: Apply 1 Application topically 2 (two) times daily as needed (skin irritation, itching). 03/17/22   Rhonda Brooks, MD  TRUEplus Lancets 33G MISC Use as directed to monitor FSBS 1x daily. Dx: E11.9 02/15/20   Rhonda Brooks, MD    Physical Exam: Vitals:   10/15/22 1500 10/15/22 1515 10/15/22 1530 10/15/22 1630  BP: (!) 91/45 110/63 114/77 117/73  Pulse: (!) 109 (!) 107 (!) 108 (!) 101  Resp: 15 16 17 17   Temp:   99 F (37.2 C)   TempSrc:      SpO2: 92% 93% 93% 92%  Weight:      Height:       General: Patient appears to be restful/sleepy, not engaging in a meaningful conversation.  NGT in situ to intermittent suction, abdominal binder in place Respiratory exam: Bilateral  intravesicular Cardiovascular exam S1-S2 normal Abdomen: Binder in place diffusely tender Extremities warm without edema, follows directions with all 4 extremities. Data Reviewed:   Labs on Admission:  Results for orders placed or performed during the hospital encounter of 10/15/22 (from the past 24 hour(s))  Comprehensive metabolic panel     Status: Abnormal   Collection Time: 10/15/22  7:54 AM  Result Value Ref Range   Sodium 132 (L) 135 - 145 mmol/L   Potassium 4.3 3.5 - 5.1 mmol/L   Chloride 93 (L) 98 - 111 mmol/L   CO2 23 22 - 32 mmol/L   Glucose, Bld 136 (H) 70 - 99 mg/dL   BUN 19 8 - 23 mg/dL   Creatinine, Ser 1.61 0.44 - 1.00 mg/dL   Calcium 9.1 8.9 - 09.6 mg/dL   Total Protein 7.2 6.5 - 8.1 g/dL   Albumin 3.3 (L) 3.5 - 5.0 g/dL   AST 39 15 - 41 U/L   ALT 30 0 - 44 U/L   Alkaline Phosphatase 145 (H) 38 - 126 U/L   Total Bilirubin 1.1 0.3 - 1.2 mg/dL   GFR, Estimated >04 >54 mL/min   Anion gap 16 (H) 5 - 15  Lipase, blood     Status: None   Collection Time: 10/15/22  7:54 AM  Result Value Ref Range   Lipase 26 11 - 51 U/L  CBC with Differential     Status: Abnormal   Collection Time: 10/15/22  7:54 AM  Result Value Ref Range   WBC 12.0 (H) 4.0 - 10.5 K/uL   RBC 3.70 (L) 3.87 - 5.11 MIL/uL   Hemoglobin 11.0 (L) 12.0 - 15.0 g/dL   HCT 09.8 (L) 11.9 - 14.7 %   MCV 91.6 80.0 - 100.0 fL   MCH 29.7 26.0 - 34.0 pg   MCHC 32.4 30.0 - 36.0 g/dL   RDW 82.9 56.2 - 13.0 %   Platelets 379 150 - 400 K/uL   nRBC 0.0 0.0 - 0.2 %   Neutrophils Relative % 86 %   Neutro Abs 10.4 (H) 1.7 - 7.7 K/uL   Lymphocytes Relative 7 %   Lymphs Abs 0.8 0.7 - 4.0 K/uL   Monocytes Relative 6 %   Monocytes Absolute 0.7 0.1 - 1.0 K/uL   Eosinophils Relative 0 %   Eosinophils Absolute 0.0 0.0 - 0.5 K/uL   Basophils Relative 0 %  Basophils Absolute 0.0 0.0 - 0.1 K/uL   Immature Granulocytes 1 %   Abs Immature Granulocytes 0.06 0.00 - 0.07 K/uL  Lactic acid, plasma     Status: None    Collection Time: 10/15/22 11:31 AM  Result Value Ref Range   Lactic Acid, Venous 1.5 0.5 - 1.9 mmol/L  CBG monitoring, ED     Status: Abnormal   Collection Time: 10/15/22 12:39 PM  Result Value Ref Range   Glucose-Capillary 127 (H) 70 - 99 mg/dL  CBG monitoring, ED     Status: Abnormal   Collection Time: 10/15/22  2:37 PM  Result Value Ref Range   Glucose-Capillary 135 (H) 70 - 99 mg/dL  Lactic acid, plasma     Status: None   Collection Time: 10/15/22  3:49 PM  Result Value Ref Range   Lactic Acid, Venous 1.1 0.5 - 1.9 mmol/L   Basic Metabolic Panel: Recent Labs  Lab 10/15/22 0754  NA 132*  K 4.3  CL 93*  CO2 23  GLUCOSE 136*  BUN 19  CREATININE 0.84  CALCIUM 9.1   Liver Function Tests: Recent Labs  Lab 10/15/22 0754  AST 39  ALT 30  ALKPHOS 145*  BILITOT 1.1  PROT 7.2  ALBUMIN 3.3*   Recent Labs  Lab 10/15/22 0754  LIPASE 26   No results for input(s): "AMMONIA" in the last 168 hours. CBC: Recent Labs  Lab 10/15/22 0754  WBC 12.0*  NEUTROABS 10.4*  HGB 11.0*  HCT 33.9*  MCV 91.6  PLT 379   Cardiac Enzymes: No results for input(s): "CKTOTAL", "CKMB", "CKMBINDEX", "TROPONINIHS" in the last 168 hours.  BNP (last 3 results) No results for input(s): "PROBNP" in the last 8760 hours. CBG: Recent Labs  Lab 10/15/22 1239 10/15/22 1437  GLUCAP 127* 135*    Radiological Exams on Admission:  DG Abd 1 View  Result Date: 10/15/2022 CLINICAL DATA:  Nasogastric tube placement. EXAM: ABDOMEN - 1 VIEW COMPARISON:  CT earlier today FINDINGS: Tip and side port of the enteric tube below the diaphragm in the stomach. Gaseous distention of small bowel in the left abdomen. Possible external artifact projecting over the right abdomen was not seen on prior CT. IMPRESSION: Tip and side port of the enteric tube below the diaphragm in the stomach. Electronically Signed   By: Narda Rutherford M.D.   On: 10/15/2022 17:09   CT ABDOMEN PELVIS WO CONTRAST  Result Date:  10/15/2022 CLINICAL DATA:  Bowel obstruction suspected.  Nausea EXAM: CT ABDOMEN AND PELVIS WITHOUT CONTRAST TECHNIQUE: Multidetector CT imaging of the abdomen and pelvis was performed following the standard protocol without IV contrast. RADIATION DOSE REDUCTION: This exam was performed according to the departmental dose-optimization program which includes automated exposure control, adjustment of the mA and/or kV according to patient size and/or use of iterative reconstruction technique. COMPARISON:  01/01/2022 FINDINGS: Lower chest: No acute abnormality Hepatobiliary: No focal hepatic abnormality.  Prior cholecystectomy Pancreas: No focal abnormality or ductal dilatation. Spleen: No focal abnormality.  Normal size. Adrenals/Urinary Tract: No adrenal abnormality. No focal renal abnormality. No stones or hydronephrosis. Urinary bladder is unremarkable. Stomach/Bowel: There is an umbilical hernia containing small bowel loop. Associated dilated small bowel proximal to the hernia compatible with small bowel obstruction. Stomach unremarkable. Sigmoid diverticulosis without active diverticulitis. Vascular/Lymphatic: Aortic atherosclerosis. No evidence of aneurysm or adenopathy. Reproductive: Prior hysterectomy.  No adnexal masses. Other: No free fluid or free air. Musculoskeletal: No acute bony abnormality. IMPRESSION: Moderate-sized umbilical hernia containing a small  bowel loop causing small bowel obstruction. Sigmoid diverticulosis. Aortic atherosclerosis. Electronically Signed   By: Charlett Nose M.D.   On: 10/15/2022 11:18       Family Communication: per priamry team Primary team communication: I reached out to Dr. Maurine Minister to address any specific issues he would like me to take care of. Above discussed. Thank you very much for involving [place] in the care of your patient.  Author: Nolberto Hanlon, MD 10/15/2022 5:35 PM  For on call review [email or web address].

## 2022-10-15 NOTE — ED Triage Notes (Signed)
Pt bib AEMS from [facility]. Pt cx of nausea since Tuesday, no cx of abdominal pain. Has a hx of diabetes and abdominal hernia. Ems states that a third party called [facility] "[facility] was not taking care of he properly". 4mg  of zofran was given by EMS.  B/p 138/83 P: 118 Temp: 97.6

## 2022-10-15 NOTE — ED Notes (Signed)
Pt cx of being cold, warm blanket provided.  No new acute distress noted. Pt resting in bed, call light within reach, spo2 monitor reapplied.

## 2022-10-15 NOTE — Transfer of Care (Signed)
Immediate Anesthesia Transfer of Care Note  Patient: Rhonda Fields  Procedure(s) Performed: HERNIA REPAIR VENTRAL ADULT  Patient Location: PACU  Anesthesia Type:General  Level of Consciousness: awake, alert , oriented, and patient cooperative  Airway & Oxygen Therapy: Patient Spontanous Breathing  Post-op Assessment: Report given to RN and Post -op Vital signs reviewed and stable  Post vital signs: Reviewed and stable  Last Vitals:  Vitals Value Taken Time  BP 123/61 10/15/22 1433  Temp 37   Pulse 105 10/15/22 1437  Resp 19 10/15/22 1437  SpO2 95 % 10/15/22 1437  Vitals shown include unfiled device data.  Last Pain:  Vitals:   10/15/22 1245  TempSrc: Temporal  PainSc: 5          Complications: There were no known notable events for this encounter.

## 2022-10-15 NOTE — Assessment & Plan Note (Signed)
S/p reduction with viable bowel and closure by Sx. Patient seems to be in post op ileus. On NGT. Monitor electrolytes (K and HCO3)

## 2022-10-16 ENCOUNTER — Encounter: Payer: Self-pay | Admitting: General Surgery

## 2022-10-16 DIAGNOSIS — K436 Other and unspecified ventral hernia with obstruction, without gangrene: Secondary | ICD-10-CM | POA: Diagnosis not present

## 2022-10-16 LAB — COMPREHENSIVE METABOLIC PANEL
ALT: 24 U/L (ref 0–44)
AST: 29 U/L (ref 15–41)
Albumin: 2.6 g/dL — ABNORMAL LOW (ref 3.5–5.0)
Alkaline Phosphatase: 106 U/L (ref 38–126)
Anion gap: 9 (ref 5–15)
BUN: 14 mg/dL (ref 8–23)
CO2: 26 mmol/L (ref 22–32)
Calcium: 7.8 mg/dL — ABNORMAL LOW (ref 8.9–10.3)
Chloride: 100 mmol/L (ref 98–111)
Creatinine, Ser: 0.83 mg/dL (ref 0.44–1.00)
GFR, Estimated: >60 mL/min (ref >60)
Glucose, Bld: 108 mg/dL — ABNORMAL HIGH (ref 70–99)
Potassium: 4.8 mmol/L (ref 3.5–5.1)
Sodium: 135 mmol/L (ref 135–145)
Total Bilirubin: 0.7 mg/dL (ref 0.3–1.2)
Total Protein: 5.7 g/dL — ABNORMAL LOW (ref 6.5–8.1)

## 2022-10-16 LAB — MAGNESIUM: Magnesium: 1.3 mg/dL — ABNORMAL LOW (ref 1.7–2.4)

## 2022-10-16 LAB — GLUCOSE, CAPILLARY
Glucose-Capillary: 102 mg/dL — ABNORMAL HIGH (ref 70–99)
Glucose-Capillary: 97 mg/dL (ref 70–99)
Glucose-Capillary: 80 mg/dL (ref 70–99)
Glucose-Capillary: 91 mg/dL (ref 70–99)

## 2022-10-16 LAB — PHOSPHORUS: Phosphorus: 3.7 mg/dL (ref 2.5–4.6)

## 2022-10-16 LAB — CBC
HCT: 27.9 % — ABNORMAL LOW (ref 36.0–46.0)
Hemoglobin: 9.2 g/dL — ABNORMAL LOW (ref 12.0–15.0)
MCH: 30.4 pg (ref 26.0–34.0)
MCHC: 33 g/dL (ref 30.0–36.0)
MCV: 92.1 fL (ref 80.0–100.0)
Platelets: 285 10*3/uL (ref 150–400)
RBC: 3.03 MIL/uL — ABNORMAL LOW (ref 3.87–5.11)
RDW: 15.7 % — ABNORMAL HIGH (ref 11.5–15.5)
WBC: 9.3 10*3/uL (ref 4.0–10.5)
nRBC: 0 % (ref 0.0–0.2)

## 2022-10-16 MED ORDER — MAGNESIUM SULFATE 2 GM/50ML IV SOLN
2.0000 g | Freq: Once | INTRAVENOUS | Status: AC
  Administered 2022-10-16: 2 g via INTRAVENOUS
  Filled 2022-10-16: qty 50

## 2022-10-16 NOTE — Progress Notes (Signed)
Mobility Specialist - Progress Note     10/16/22 1734  Mobility  Activity Transferred to/from BSC;Stood at bedside  Level of Assistance Minimal assist, patient does 75% or more  Assistive Device Front wheel walker  Distance Ambulated (ft) 4 ft  Activity Response Tolerated well  Mobility Referral Yes  $Mobility charge 1 Mobility  Mobility Specialist Start Time (ACUTE ONLY) 1722  Mobility Specialist Stop Time (ACUTE ONLY) 1735  Mobility Specialist Time Calculation (min) (ACUTE ONLY) 13 min   Pt in bed upon entry. Pt STS and transfers to [facility] MinA with RW. Pt returned to bed and left with needs in reach. Bed alarm activated.   Johnathan Hausen Mobility Specialist 10/16/22, 5:40 PM

## 2022-10-16 NOTE — Evaluation (Signed)
Physical Therapy Evaluation Patient Details Name: Rhonda Fields MRN: [id number] DOB: 01-09-1951 Today's Date: 10/16/2022  History of Present Illness  presented to ER from [facility] secondary to persistent nausea, abdominal pain; admitted for management of umbilical hernia with associated SBO, s/p ventral hernia repair (9/26).  Of note, recent PMH significant for R hip fracture s/p IMN (9/4), PWB 25%  Clinical Impression  Patient resting in bed upon arrival to room; "best friend" present at bedside throughout session.  Patient alert and oriented, follows commands; agreeable to participation with session.  Endorses abdominal pain 8/10; meds received prior to session.  Bilat UE/LE strength and ROM grossly symmetrical and WFL for basic transfers and gait; R hip/knee mildly guarded due to pain with active movement.  Currently completes bed mobility with close sup (increased time); sit/stand, standing balance and basic transfers (bed/chair, chair/BSC) with RW, cga/min assist.  Demonstrates 3-point gait pattern with good walker position/management; slow and deliberate, but good safety awareness and compensatory strategies throughout. Additional gait distance deferred due to toileting needs, but will continue to assess/progress in subsequent sessions as appropriate. Of note, patient indep verbalizes awareness of R LE WBing restrictions (25%); do anticipate difficulty maintaining this with longer gait distances. Would benefit from skilled PT to address above deficits and promote optimal return to PLOF.; recommend post-acute PT follow up as indicated by interdisciplinary care team.            If plan is discharge home, recommend the following: A little help with walking and/or transfers;A little help with bathing/dressing/bathroom   Can travel by private vehicle        Equipment Recommendations  (has access to RW, 8MVH)  Recommendations for Other Services       Functional Status Assessment Patient  has had a recent decline in their functional status and demonstrates the ability to make significant improvements in function in a reasonable and predictable amount of time.     Precautions / Restrictions Precautions Precautions: Fall Required Braces or Orthoses:  (Abdominal binder) Restrictions Weight Bearing Restrictions: Yes RLE Weight Bearing: Weight bearing as tolerated RLE Partial Weight Bearing Percentage or Pounds: 25      Mobility  Bed Mobility Overal bed mobility: Needs Assistance Bed Mobility: Supine to Sit Rolling: Supervision         General bed mobility comments: prefers transition towards L side of bed; increased time/effort required to complete, but able to do without physical assist (using elevated HOB and bedrails to assist)    Transfers Overall transfer level: Needs assistance Equipment used: Rolling walker (2 wheels) Transfers: Sit to/from Stand, Bed to chair/wheelchair/BSC Sit to Stand: Contact guard assist, Min assist Stand pivot transfers: Min assist, Contact guard assist              Ambulation/Gait               General Gait Details: deferred this date due to toileting needs  Stairs            Wheelchair Mobility     Tilt Bed    Modified Rankin (Stroke Patients Only)       Balance Overall balance assessment: Needs assistance Sitting-balance support: No upper extremity supported, Feet supported Sitting balance-Leahy Scale: Good     Standing balance support: Bilateral upper extremity supported Standing balance-Leahy Scale: Fair  Pertinent Vitals/Pain Pain Assessment Pain Assessment: 0-10 Pain Score: 8  Pain Location: RLE Pain Descriptors / Indicators: Grimacing, Guarding, Sharp, Crying Pain Intervention(s): Limited activity within patient's tolerance, Monitored during session, Premedicated before session, Repositioned    Home Living Family/patient expects to be  discharged to:: Private residence   Available Help at Discharge: Friend(s);Available PRN/intermittently Type of Home: House Home Access: Stairs to enter   Entrance Stairs-Number of Steps: 2   Home Layout: One level Home Equipment: Cane - single Librarian, academic (2 wheels);Rollator (4 wheels) Additional Comments: At baseline, was living with daughter; plans to transition to friends home (layout described above) upon discharge from this hospitalization    Prior Function Prior Level of Function : Independent/Modified Independent             Mobility Comments: Indep with ADLs, household and community mobilization without assist device; does endorse multiple fall history       Extremity/Trunk Assessment   Upper Extremity Assessment Upper Extremity Assessment: Overall WFL for tasks assessed (grossly 4/5 throughout)    Lower Extremity Assessment Lower Extremity Assessment: Generalized weakness (grossly 4-/5 throughout, R hip/knee generally limited by pain)       Communication   Communication Communication: Hearing impairment (hears best from R ear)  Cognition Arousal: Alert Behavior During Therapy: WFL for tasks assessed/performed Overall Cognitive Status: Within Functional Limits for tasks assessed                                          General Comments      Exercises Other Exercises Other Exercises: Toilet transfer, SPT with RW, cga/min assist; sit/stand from [facility] with RW, cga/min assist.  Good safety awareness, good walker control; slow and deliberate with transitional movements   Assessment/Plan    PT Assessment Patient needs continued PT services  PT Problem List Decreased strength;Decreased balance;Decreased knowledge of precautions;Pain;Decreased mobility;Decreased knowledge of use of DME;Decreased activity tolerance       PT Treatment Interventions DME instruction;Functional mobility training;Balance training;Patient/family  education;Therapeutic activities;Gait training;Therapeutic exercise    PT Goals (Current goals can be found in the Care Plan section)  Acute Rehab PT Goals Patient Stated Goal: to get stronger PT Goal Formulation: With patient Time For Goal Achievement: 10/30/22 Potential to Achieve Goals: Good    Frequency Min 1X/week     Co-evaluation               AM-PAC PT "6 Clicks" Mobility  Outcome Measure Help needed turning from your back to your side while in a flat bed without using bedrails?: None Help needed moving from lying on your back to sitting on the side of a flat bed without using bedrails?: None Help needed moving to and from a bed to a chair (including a wheelchair)?: A Little Help needed standing up from a chair using your arms (e.g., wheelchair or bedside chair)?: A Little Help needed to walk in hospital room?: A Little Help needed climbing 3-5 steps with a railing? : A Little 6 Click Score: 20    End of Session   Activity Tolerance: Patient tolerated treatment well Patient left: in chair;with call bell/phone within reach;with chair alarm set Nurse Communication: Mobility status PT Visit Diagnosis: Other abnormalities of gait and mobility (R26.89);Muscle weakness (generalized) (M62.81);Pain;Difficulty in walking, not elsewhere classified (R26.2) Pain - Right/Left: Right Pain - part of body: Hip;Leg    Time: 1610-9604 PT  Time Calculation (min) (ACUTE ONLY): 29 min   Charges:   PT Evaluation $PT Eval Moderate Complexity: 1 Mod PT Treatments $Therapeutic Activity: 8-22 mins PT General Charges $$ ACUTE PT VISIT: 1 Visit        Renell Allum H. Manson Passey, PT, DPT, NCS 10/16/22, 12:01 PM [phone]

## 2022-10-16 NOTE — Progress Notes (Signed)
Progress Note    Rhonda Fields  VHQ:[id number] DOB: 1950-12-28  DOA: 10/15/2022 PCP: Donita Brooks, MD      Brief Narrative:    Medical records reviewed and are as summarized below:  Rhonda Fields is a 72 y.o. female with medical history significant for type II DM, anxiety, arrhythmia, chronic headache, depression, GERD, IBS, hearing impairment, who was brought to the hospital because of nausea, vomiting and abdominal pain.  She was admitted to the hospital for small bowel obstruction secondary to incarcerated ventral hernia.  She underwent abdominal hernia repair on 10/15/2022.  Hospitalist team was consulted to assist with medical management.      Assessment/Plan:   Principal Problem:   Incarcerated ventral hernia Active Problems:   DM2 (diabetes mellitus, type 2) (HCC)   HTN (hypertension)    Body mass index is 25.06 kg/m.   Small bowel obstruction secondary to incarcerated ventral hernia: S/p ventral hernia repair on 10/15/2022.  She is still on p.o. and she is on IV fluids for hydration.  Follow-up with general surgeon.   Type II DM: NovoLog as needed for hyperglycemia.   Acute anemia, probably acute blood loss anemia from recent surgery:  Monitor H&H closely and transfuse as needed.   Hypomagnesemia: Repleted with IV magnesium sulfate.  Monitor electrolytes and replete as needed.   Hypertension: BP still soft.  Hold losartan.   Diet Order             Diet NPO time specified Except for: Sips with Meds  Diet effective now                            Consultants: General surgeon  Procedures: Abdominal hernia repair on 10/15/2022    Medications:    acetaminophen  1,000 mg Oral Q6H   aspirin  81 mg Oral Daily   atorvastatin  40 mg Oral Daily   enoxaparin (LOVENOX) injection  40 mg Subcutaneous Q24H   FLUoxetine  20 mg Oral Daily   insulin aspart  0-15 Units Subcutaneous TID WC   insulin aspart  0-5 Units  Subcutaneous QHS   oxybutynin  5 mg Oral QID   pantoprazole (PROTONIX) IV  40 mg Intravenous QHS   Continuous Infusions:  lactated ringers     lactated ringers 75 mL/hr at 10/16/22 0640   promethazine (PHENERGAN) injection (IM or IVPB) Stopped (10/15/22 1229)     Anti-infectives (From admission, onward)    Start     Dose/Rate Route Frequency Ordered Stop   10/16/22 0600  cefOXitin (MEFOXIN) 2 g in sodium chloride 0.9 % 100 mL IVPB  Status:  Discontinued        2 g 200 mL/hr over 30 Minutes Intravenous On call to O.R. 10/15/22 1207 10/15/22 1208   10/15/22 1215  cefOXitin (MEFOXIN) 2 g in sodium chloride 0.9 % 100 mL IVPB        2 g 200 mL/hr over 30 Minutes Intravenous On call to O.R. 10/15/22 1208 10/16/22 0559              Family Communication/Anticipated D/C date and plan/Code Status   DVT prophylaxis: enoxaparin (LOVENOX) injection 40 mg Start: 10/16/22 0800 SCDs Start: 10/15/22 1205     Code Status: Full Code  Family Communication: None Disposition Plan: Plan to discharge to SNF          Subjective:   Interval events noted.  She complains of  abdominal soreness.  She has not passed gas nor move her bowels.  No vomiting.  Objective:    Vitals:   10/15/22 2117 10/15/22 2344 10/16/22 0352 10/16/22 0738  BP: 98/61 (!) 101/59 (!) 102/56 (!) 109/58  Pulse: 100 85 97 88  Resp: 18 20 20 16   Temp: 97.9 F (36.6 C) 97.8 F (36.6 C) 98 F (36.7 C) 98.7 F (37.1 C)  TempSrc: Oral Oral Oral Oral  SpO2: 96% 94% 96% 96%  Weight:      Height:       No data found.   Intake/Output Summary (Last 24 hours) at 10/16/2022 0934 Last data filed at 10/16/2022 0640 Gross per 24 hour  Intake 1802.38 ml  Output 1510 ml  Net 292.38 ml   Filed Weights   10/15/22 0747  Weight: 62.1 kg    Exam:  GEN: NAD SKIN: Warm and dry EYES: No pallor or icterus ENT: MMM, NG tube in place CV: RRR PULM: CTA B ABD: soft, ND, surgical tenderness, +BS, + abdominal  binder, surgical incision is clean, dry and intact CNS: AAO x 3, non focal EXT: No edema or tenderness        Data Reviewed:   I have personally reviewed following labs and imaging studies:  Labs: Labs show the following:   Basic Metabolic Panel: Recent Labs  Lab 10/15/22 0754 10/16/22 0339  NA 132* 135  K 4.3 4.8  CL 93* 100  CO2 23 26  GLUCOSE 136* 108*  BUN 19 14  CREATININE 0.84 0.83  CALCIUM 9.1 7.8*  MG  --  1.3*  PHOS  --  3.7   GFR Estimated Creatinine Clearance: 53.9 mL/min (by C-G formula based on SCr of 0.83 mg/dL). Liver Function Tests: Recent Labs  Lab 10/15/22 0754 10/16/22 0339  AST 39 29  ALT 30 24  ALKPHOS 145* 106  BILITOT 1.1 0.7  PROT 7.2 5.7*  ALBUMIN 3.3* 2.6*   Recent Labs  Lab 10/15/22 0754  LIPASE 26   No results for input(s): "AMMONIA" in the last 168 hours. Coagulation profile No results for input(s): "INR", "PROTIME" in the last 168 hours.  CBC: Recent Labs  Lab 10/15/22 0754 10/16/22 0339  WBC 12.0* 9.3  NEUTROABS 10.4*  --   HGB 11.0* 9.2*  HCT 33.9* 27.9*  MCV 91.6 92.1  PLT 379 285   Cardiac Enzymes: No results for input(s): "CKTOTAL", "CKMB", "CKMBINDEX", "TROPONINI" in the last 168 hours. BNP (last 3 results) No results for input(s): "PROBNP" in the last 8760 hours. CBG: Recent Labs  Lab 10/15/22 1239 10/15/22 1437 10/15/22 2115 10/16/22 0740  GLUCAP 127* 135* 134* 102*   D-Dimer: No results for input(s): "DDIMER" in the last 72 hours. Hgb A1c: Recent Labs    10/15/22 0754  HGBA1C 5.2   Lipid Profile: No results for input(s): "CHOL", "HDL", "LDLCALC", "TRIG", "CHOLHDL", "LDLDIRECT" in the last 72 hours. Thyroid function studies: No results for input(s): "TSH", "T4TOTAL", "T3FREE", "THYROIDAB" in the last 72 hours.  Invalid input(s): "FREET3" Anemia work up: No results for input(s): "VITAMINB12", "FOLATE", "FERRITIN", "TIBC", "IRON", "RETICCTPCT" in the last 72 hours. Sepsis Labs: Recent  Labs  Lab 10/15/22 0754 10/15/22 1131 10/15/22 1549 10/16/22 0339  WBC 12.0*  --   --  9.3  LATICACIDVEN  --  1.5 1.1  --     Microbiology No results found for this or any previous visit (from the past 240 hour(s)).  Procedures and diagnostic studies:  DG Abd 1 View  Result Date: 10/15/2022 CLINICAL DATA:  Nasogastric tube placement. EXAM: ABDOMEN - 1 VIEW COMPARISON:  CT earlier today FINDINGS: Tip and side port of the enteric tube below the diaphragm in the stomach. Gaseous distention of small bowel in the left abdomen. Possible external artifact projecting over the right abdomen was not seen on prior CT. IMPRESSION: Tip and side port of the enteric tube below the diaphragm in the stomach. Electronically Signed   By: Narda Rutherford M.D.   On: 10/15/2022 17:09   CT ABDOMEN PELVIS WO CONTRAST  Result Date: 10/15/2022 CLINICAL DATA:  Bowel obstruction suspected.  Nausea EXAM: CT ABDOMEN AND PELVIS WITHOUT CONTRAST TECHNIQUE: Multidetector CT imaging of the abdomen and pelvis was performed following the standard protocol without IV contrast. RADIATION DOSE REDUCTION: This exam was performed according to the departmental dose-optimization program which includes automated exposure control, adjustment of the mA and/or kV according to patient size and/or use of iterative reconstruction technique. COMPARISON:  01/01/2022 FINDINGS: Lower chest: No acute abnormality Hepatobiliary: No focal hepatic abnormality.  Prior cholecystectomy Pancreas: No focal abnormality or ductal dilatation. Spleen: No focal abnormality.  Normal size. Adrenals/Urinary Tract: No adrenal abnormality. No focal renal abnormality. No stones or hydronephrosis. Urinary bladder is unremarkable. Stomach/Bowel: There is an umbilical hernia containing small bowel loop. Associated dilated small bowel proximal to the hernia compatible with small bowel obstruction. Stomach unremarkable. Sigmoid diverticulosis without active diverticulitis.  Vascular/Lymphatic: Aortic atherosclerosis. No evidence of aneurysm or adenopathy. Reproductive: Prior hysterectomy.  No adnexal masses. Other: No free fluid or free air. Musculoskeletal: No acute bony abnormality. IMPRESSION: Moderate-sized umbilical hernia containing a small bowel loop causing small bowel obstruction. Sigmoid diverticulosis. Aortic atherosclerosis. Electronically Signed   By: Charlett Nose M.D.   On: 10/15/2022 11:18               LOS: 1 day   Derek Laughter  [facility]   Pager on [email or web address]. If 7PM-7AM, please contact night-coverage at [email or web address]     10/16/2022, 9:34 AM

## 2022-10-16 NOTE — Progress Notes (Addendum)
[facility] SURGICAL PROGRESS NOTE  Hospital Day(s): 1.   Post op day(s): 1 Day Post-Op.   Interval History:  Patient seen and examined No acute events or new complaints overnight.  Patient reports she is feeling better Incisional soreness as expected No fever, chills, nausea, emesis Previously noted leukocytosis normalized; now 9.3K Hgb to 9.2 Renal function normal; sCr - 0.83; UO - 600 ccs Hypomagnesemia 1.3 NGT with 500 ccs out in last 12 hours; output quite thin Minimal flatus reported  Has not been out of bed   Vital signs in last 24 hours: [min-max] current  Temp:  [97.7 F (36.5 C)-99.2 F (37.3 C)] 98 F (36.7 C) (09/27 0352) Pulse Rate:  [85-118] 97 (09/27 0352) Resp:  [12-20] 20 (09/27 0352) BP: (91-124)/(45-78) 102/56 (09/27 0352) SpO2:  [92 %-100 %] 96 % (09/27 0352) Weight:  [62.1 kg] 62.1 kg (09/26 0747)     Height: 5\' 2"  (157.5 cm) Weight: 62.1 kg BMI (Calculated): 25.05   Intake/Output last 2 shifts:  09/26 0701 - 09/27 0700 In: 2802.4 [I.V.:1602.4; IV Piggyback:1200] Out: 1510 [Urine:600; Emesis/NG output:500; Blood:10]   Physical Exam:  Constitutional: alert, cooperative and no distress  HEENT: NGT in place; output thin  Respiratory: breathing non-labored at rest  Cardiovascular: regular rate and sinus rhythm  Gastrointestinal: soft, incisional tenderness, and non-distended, no rebound/guarding Integumentary: Ventral incision is CDI with dermabond, no erythema, no drainage   Labs:     Latest Ref Rng & Units 10/16/2022    3:39 AM 10/15/2022    7:54 AM 09/28/2022    9:58 AM  CBC  WBC 4.0 - 10.5 K/uL 9.3  12.0  11.5   Hemoglobin 12.0 - 15.0 g/dL 9.2  16.1  7.9   Hematocrit 36.0 - 46.0 % 27.9  33.9  23.5   Platelets 150 - 400 K/uL 285  379  476       Latest Ref Rng & Units 10/16/2022    3:39 AM 10/15/2022    7:54 AM 09/28/2022    9:58 AM  CMP  Glucose 70 - 99 mg/dL 096  045  409   BUN 8 - 23 mg/dL 14  19  8    Creatinine 0.44 -  1.00 mg/dL 8.11  9.14  7.82   Sodium 135 - 145 mmol/L 135  132  131   Potassium 3.5 - 5.1 mmol/L 4.8  4.3  3.8   Chloride 98 - 111 mmol/L 100  93  96   CO2 22 - 32 mmol/L 26  23  27    Calcium 8.9 - 10.3 mg/dL 7.8  9.1  8.1   Total Protein 6.5 - 8.1 g/dL 5.7  7.2    Total Bilirubin 0.3 - 1.2 mg/dL 0.7  1.1    Alkaline Phos 38 - 126 U/L 106  145    AST 15 - 41 U/L 29  39    ALT 0 - 44 U/L 24  30       Imaging studies: No new pertinent imaging studies   Assessment/Plan:  72 y.o. female 1 Day Post-Op s/p ventral hernia repair x2 (5 cm total) for incarcerated ventral hernia with associated SBO.   - Will proceed with NGT clamping trial. Clamp NG x4 hours and check residuals. If residuals are <150 ccs, we can remove NGT and allow for sips and ice chips only today - Continue NPO pending clamping trial as above - Continue IVF support; LR at 75 ml.hr - Monitor abdominal examination; on-going bowel  function - Pain control prn; antiemetics prn - Replete Mg2+; morning labs - Mobilize; engage PT - utilize abdominal binder   - Appreciate medicine support with comorbidities; SSI     - Discharge Planning: Will certainly require another 48-72 hours. Of note, she has not been happy with care at [facility]. She wishes to go somewhere different at discharge. We will engage therapy today for ambulation and evaluation of mobility/functional status.    All of the above findings and recommendations were discussed with the patient, and the medical team, and all of patient's questions were answered to her expressed satisfaction.  -- Lynden Oxford, PA-C [facility] 10/16/2022, 7:02 AM M-F: 7am - 4pm

## 2022-10-16 NOTE — Anesthesia Postprocedure Evaluation (Signed)
Anesthesia Post Note  Patient: Rhonda Fields  Procedure(s) Performed: HERNIA REPAIR VENTRAL ADULT  Patient location during evaluation: PACU Anesthesia Type: General Level of consciousness: awake and alert Pain management: pain level controlled Vital Signs Assessment: post-procedure vital signs reviewed and stable Respiratory status: spontaneous breathing, nonlabored ventilation, respiratory function stable and patient connected to nasal cannula oxygen Cardiovascular status: blood pressure returned to baseline and stable Postop Assessment: no apparent nausea or vomiting Anesthetic complications: no   There were no known notable events for this encounter.   Last Vitals:  Vitals:   10/16/22 0352 10/16/22 0738  BP: (!) 102/56 (!) 109/58  Pulse: 97 88  Resp: 20 16  Temp: 36.7 C 37.1 C  SpO2: 96% 96%    Last Pain:  Vitals:   10/16/22 0813  TempSrc:   PainSc: 9                  Cleda Mccreedy Jeanpaul Biehl

## 2022-10-16 NOTE — TOC Initial Note (Signed)
Transition of Care [facility]) - Initial/Assessment Note    Patient Details  Name: Rhonda Fields MRN: [id number] Date of Birth: Jul 08, 1950  Transition of Care [facility]) CM/SW Contact:    Margarito Liner, LCSW Phone Number: 10/16/2022, 4:19 PM  Clinical Narrative:  CSW met with patient. No supports at bedside. CSW introduced role and explained that PT recommendations would be discussed. Patient has been at [facility] since 9/9 and does not want to return. She would prefer to return home with home health. No agency preference. Patient stated [facility] was working on ordering a RW and wheelchair for her. She is also requesting a 3-in-1. Patient reports that between her friend, friend's husband, and friend's son, someone is always home with her. Friend will be the one to pick her up. No further concerns. CSW encouraged patient to contact CSW as needed. CSW will continue to follow patient for support and facilitate return home once stable.                Expected Discharge Plan: Home w Home Health Services Barriers to Discharge: Continued Medical Work up   Patient Goals and CMS Choice            Expected Discharge Plan and Services     Post Acute Care Choice: Home Health Living arrangements for the past 2 months: Single Family Home                                      Prior Living Arrangements/Services Living arrangements for the past 2 months: Single Family Home Lives with:: Friends Patient language and need for interpreter reviewed:: Yes Do you feel safe going back to the place where you live?: Yes      Need for Family Participation in Patient Care: Yes (Comment) Care giver support system in place?: Yes (comment)   Criminal Activity/Legal Involvement Pertinent to Current Situation/Hospitalization: No - Comment as needed  Activities of Daily Living   ADL Screening (condition at time of admission) Does the patient have a NEW difficulty with  bathing/dressing/toileting/self-feeding that is expected to last >3 days?: No Does the patient have a NEW difficulty with getting in/out of bed, walking, or climbing stairs that is expected to last >3 days?: No Does the patient have a NEW difficulty with communication that is expected to last >3 days?: No Is the patient deaf or have difficulty hearing?: Yes Does the patient have difficulty seeing, even when wearing glasses/contacts?: No Does the patient have difficulty concentrating, remembering, or making decisions?: No  Permission Sought/Granted Permission sought to share information with : Oceanographer granted to share information with : Yes, Verbal Permission Granted     Permission granted to share info w AGENCY: Home health agencies        Emotional Assessment Appearance:: Appears stated age Attitude/Demeanor/Rapport: Engaged, Gracious Affect (typically observed): Appropriate, Calm, Pleasant Orientation: : Oriented to Self, Oriented to Place, Oriented to  Time, Oriented to Situation Alcohol / Substance Use: Not Applicable Psych Involvement: No (comment)  Admission diagnosis:  Incarcerated ventral hernia [K43.6] Patient Active Problem List   Diagnosis Date Noted   Incarcerated ventral hernia 10/15/2022   Hip fracture (HCC) 09/23/2022   Displaced spiral fracture of shaft of right femur, initial encounter for closed fracture (HCC) 09/23/2022   Displaced intertrochanteric fracture of right femur, initial encounter for closed fracture (HCC) 09/22/2022   AKI (acute kidney  injury) (HCC) 09/22/2022   Leukocytosis 09/22/2022   GERD (gastroesophageal reflux disease) 09/22/2022   Depression 09/22/2022   Anxiety 09/22/2022   HTN (hypertension) 09/22/2022   HLD (hyperlipidemia) 09/22/2022   DM2 (diabetes mellitus, type 2) (HCC) 01/30/2013   History of total hysterectomy with removal of both tubes and ovaries 04/08/2011   PCP:  Donita Brooks,  MD Pharmacy:   [facility] - [place], [place] - [address] [place] [address] Phone: [phone] Fax: [phone]     Social Determinants of Health (SDOH) Social History: SDOH Screenings   Food Insecurity: No Food Insecurity (10/15/2022)  Housing: Low Risk  (10/15/2022)  Transportation Needs: No Transportation Needs (10/15/2022)  Utilities: Not At Risk (10/15/2022)  Alcohol Screen: Low Risk  (03/06/2022)  Depression (PHQ2-9): Low Risk  (08/04/2022)  Financial Resource Strain: Low Risk  (03/06/2022)  Physical Activity: Insufficiently Active (03/06/2022)  Social Connections: Socially Isolated (03/06/2022)  Stress: No Stress Concern Present (03/06/2022)  Tobacco Use: High Risk (10/15/2022)   SDOH Interventions:     Readmission Risk Interventions     No data to display

## 2022-10-16 NOTE — NC FL2 (Signed)
[place] MEDICAID FL2 LEVEL OF CARE FORM     IDENTIFICATION  Patient Name: Rhonda Fields Birthdate: 1950-03-20 Sex: female Admission Date (Current Location): 10/15/2022  [facility] and [place] Number:  Chiropodist and Address:  [facility], [address] [place], [place] [address]      Provider Number: [id number]  Attending Physician Name and Address:  Kandis Cocking, MD  Relative Name and Phone Number:  Roderic Scarce [phone]    Current Level of Care: Hospital Recommended Level of Care: Skilled Nursing Facility Prior Approval Number:    Date Approved/Denied:   PASRR Number: [id number]  Discharge Plan: SNF    Current Diagnoses: Patient Active Problem List   Diagnosis Date Noted   Incarcerated ventral hernia 10/15/2022   Hip fracture (HCC) 09/23/2022   Displaced spiral fracture of shaft of right femur, initial encounter for closed fracture (HCC) 09/23/2022   Displaced intertrochanteric fracture of right femur, initial encounter for closed fracture (HCC) 09/22/2022   AKI (acute kidney injury) (HCC) 09/22/2022   Leukocytosis 09/22/2022   GERD (gastroesophageal reflux disease) 09/22/2022   Depression 09/22/2022   Anxiety 09/22/2022   HTN (hypertension) 09/22/2022   HLD (hyperlipidemia) 09/22/2022   DM2 (diabetes mellitus, type 2) (HCC) 01/30/2013   History of total hysterectomy with removal of both tubes and ovaries 04/08/2011    Orientation RESPIRATION BLADDER Height & Weight     Time, Self, Situation, Place  Normal Continent Weight: 137 lb (62.1 kg) Height:  5\' 2"  (157.5 cm)  BEHAVIORAL SYMPTOMS/MOOD NEUROLOGICAL BOWEL NUTRITION STATUS      Continent Diet (see discharge summary)  AMBULATORY STATUS COMMUNICATION OF NEEDS Skin   Extensive Assist Verbally Other (Comment), Surgical wounds (incision abdomen)                       Personal Care Assistance Level of Assistance  Bathing, Feeding, Dressing, Total care  Bathing Assistance: Limited assistance Feeding assistance: Limited assistance Dressing Assistance: Limited assistance Total Care Assistance: Limited assistance   Functional Limitations Info  Sight, Hearing, Speech Sight Info: Adequate Hearing Info: Adequate Speech Info: Adequate    SPECIAL CARE FACTORS FREQUENCY  PT (By licensed PT), OT (By licensed OT)     PT Frequency: min 4x weekly OT Frequency: min 4x weekly            Contractures Contractures Info: Not present    Additional Factors Info  Code Status, Allergies Code Status Info: full code Allergies Info: ivp dye (iodinate contrast media), mobic (meloxicam), penicillins           Current Medications (10/16/2022):  This is the current hospital active medication list Current Facility-Administered Medications  Medication Dose Route Frequency Provider Last Rate Last Admin   acetaminophen (TYLENOL) tablet 1,000 mg  1,000 mg Oral Q6H Lynden Oxford R, PA-C   1,000 mg at 10/16/22 1126   aspirin chewable tablet 81 mg  81 mg Oral Daily Nolberto Hanlon, MD   81 mg at 10/16/22 1125   atorvastatin (LIPITOR) tablet 40 mg  40 mg Oral Daily Nolberto Hanlon, MD   40 mg at 10/16/22 1126   enoxaparin (LOVENOX) injection 40 mg  40 mg Subcutaneous Q24H Nolberto Hanlon, MD   40 mg at 10/16/22 0805   FLUoxetine (PROZAC) capsule 20 mg  20 mg Oral Daily Nolberto Hanlon, MD       insulin aspart (novoLOG) injection 0-15 Units  0-15 Units Subcutaneous TID WC Donovan Kail, PA-C  insulin aspart (novoLOG) injection 0-5 Units  0-5 Units Subcutaneous QHS Donovan Kail, PA-C       lactated ringers bolus 1,000 mL  1,000 mL Intravenous Once Lynden Oxford R, PA-C       lactated ringers infusion   Intravenous Continuous Donovan Kail, PA-C 75 mL/hr at 10/16/22 1502 Infusion Verify at 10/16/22 1502   morphine (PF) 2 MG/ML injection 2-4 mg  2-4 mg Intravenous Q3H PRN Donovan Kail, PA-C   2 mg at 10/16/22 0819   ondansetron (ZOFRAN-ODT)  disintegrating tablet 4 mg  4 mg Oral Q6H PRN Donovan Kail, PA-C       Or   ondansetron [facility]) injection 4 mg  4 mg Intravenous Q6H PRN Donovan Kail, PA-C   4 mg at 10/15/22 1414   oxybutynin (DITROPAN) tablet 5 mg  5 mg Oral QID Nolberto Hanlon, MD   5 mg at 10/16/22 1346   oxyCODONE (Oxy IR/ROXICODONE) immediate release tablet 5-10 mg  5-10 mg Oral Q4H PRN Donovan Kail, PA-C       pantoprazole (PROTONIX) injection 40 mg  40 mg Intravenous QHS Lynden Oxford R, PA-C   40 mg at 10/15/22 2223   promethazine (PHENERGAN) 12.5 mg in sodium chloride 0.9 % 50 mL IVPB  12.5 mg Intravenous Q6H PRN Donovan Kail, PA-C   Stopped at 10/15/22 1229     Discharge Medications: Please see discharge summary for a list of discharge medications.  Relevant Imaging Results:  Relevant Lab Results:   Additional Information SSN [id number]  Darolyn Rua, LCSW

## 2022-10-16 NOTE — Plan of Care (Signed)
  Problem: Clinical Measurements: Goal: Postoperative complications will be avoided or minimized Outcome: Progressing   

## 2022-10-17 DIAGNOSIS — K436 Other and unspecified ventral hernia with obstruction, without gangrene: Secondary | ICD-10-CM | POA: Diagnosis not present

## 2022-10-17 LAB — BASIC METABOLIC PANEL
Anion gap: 9 (ref 5–15)
BUN: 8 mg/dL (ref 8–23)
CO2: 24 mmol/L (ref 22–32)
Calcium: 7.8 mg/dL — ABNORMAL LOW (ref 8.9–10.3)
Chloride: 104 mmol/L (ref 98–111)
Creatinine, Ser: 0.68 mg/dL (ref 0.44–1.00)
GFR, Estimated: >60 mL/min (ref >60)
Glucose, Bld: 82 mg/dL (ref 70–99)
Potassium: 3.7 mmol/L (ref 3.5–5.1)
Sodium: 137 mmol/L (ref 135–145)

## 2022-10-17 LAB — CBC
HCT: 31 % — ABNORMAL LOW (ref 36.0–46.0)
Hemoglobin: 9.6 g/dL — ABNORMAL LOW (ref 12.0–15.0)
MCH: 29.2 pg (ref 26.0–34.0)
MCHC: 31 g/dL (ref 30.0–36.0)
MCV: 94.2 fL (ref 80.0–100.0)
Platelets: 258 10*3/uL (ref 150–400)
RBC: 3.29 MIL/uL — ABNORMAL LOW (ref 3.87–5.11)
RDW: 15.4 % (ref 11.5–15.5)
WBC: 7.5 10*3/uL (ref 4.0–10.5)
nRBC: 0 % (ref 0.0–0.2)

## 2022-10-17 LAB — GLUCOSE, CAPILLARY
Glucose-Capillary: 84 mg/dL (ref 70–99)
Glucose-Capillary: 165 mg/dL — ABNORMAL HIGH (ref 70–99)
Glucose-Capillary: 93 mg/dL (ref 70–99)
Glucose-Capillary: 95 mg/dL (ref 70–99)

## 2022-10-17 LAB — MAGNESIUM: Magnesium: 1.8 mg/dL (ref 1.7–2.4)

## 2022-10-17 NOTE — Progress Notes (Signed)
Progress Note    Rhonda Fields  IHK:[id number] DOB: 12/22/1950  DOA: 10/15/2022 PCP: Donita Brooks, MD      Brief Narrative:    Medical records reviewed and are as summarized below:  Rhonda Fields is a 72 y.o. female with medical history significant for type II DM, anxiety, arrhythmia, chronic headache, depression, GERD, IBS, hearing impairment, who was brought to the hospital because of nausea, vomiting and abdominal pain.  She was admitted to the hospital for small bowel obstruction secondary to incarcerated ventral hernia.  She underwent abdominal hernia repair on 10/15/2022.  Hospitalist team was consulted to assist with medical management.      Assessment/Plan:   Principal Problem:   Incarcerated ventral hernia Active Problems:   DM2 (diabetes mellitus, type 2) (HCC)   HTN (hypertension)    Body mass index is 25.06 kg/m.   Small bowel obstruction secondary to incarcerated ventral hernia: S/p ventral hernia repair on 10/15/2022.  She is tolerating clear liquid diet.  Discontinue IV fluids.  Analgesics as needed for pain.  Follow-up with general surgeon.     Type II DM: NovoLog as needed for hyperglycemia.   Acute anemia, probably acute blood loss anemia from recent surgery:  Monitor H&H closely and transfuse as needed.   Hypomagnesemia: Improved  Hypertension: BP is optimal but will hold losartan for now.   Diet Order             Diet clear liquid Room service appropriate? Yes; Fluid consistency: Thin  Diet effective now                            Consultants: General surgeon  Procedures: Abdominal hernia repair on 10/15/2022    Medications:    acetaminophen  1,000 mg Oral Q6H   aspirin  81 mg Oral Daily   atorvastatin  40 mg Oral Daily   enoxaparin (LOVENOX) injection  40 mg Subcutaneous Q24H   FLUoxetine  20 mg Oral Daily   insulin aspart  0-15 Units Subcutaneous TID WC   insulin aspart  0-5 Units  Subcutaneous QHS   oxybutynin  5 mg Oral QID   pantoprazole (PROTONIX) IV  40 mg Intravenous QHS   Continuous Infusions:  lactated ringers     promethazine (PHENERGAN) injection (IM or IVPB) Stopped (10/15/22 1229)     Anti-infectives (From admission, onward)    Start     Dose/Rate Route Frequency Ordered Stop   10/16/22 0600  cefOXitin (MEFOXIN) 2 g in sodium chloride 0.9 % 100 mL IVPB  Status:  Discontinued        2 g 200 mL/hr over 30 Minutes Intravenous On call to O.R. 10/15/22 1207 10/15/22 1208   10/15/22 1215  cefOXitin (MEFOXIN) 2 g in sodium chloride 0.9 % 100 mL IVPB        2 g 200 mL/hr over 30 Minutes Intravenous On call to O.R. 10/15/22 1208 10/16/22 0559              Family Communication/Anticipated D/C date and plan/Code Status   DVT prophylaxis: enoxaparin (LOVENOX) injection 40 mg Start: 10/16/22 0800 SCDs Start: 10/15/22 1205     Code Status: Full Code  Family Communication: None Disposition Plan: Plan to discharge to SNF          Subjective:   Interval events noted.  She feels better today.  She is passing gas and has tolerated clear liquid diet  for breakfast.  She still has some abdominal pain especially when she moves around in bed.  No bowel movement yet.  Objective:    Vitals:   10/16/22 1454 10/16/22 2028 10/17/22 0417 10/17/22 0744  BP: 117/63 133/65 122/60 128/70  Pulse: 76 84 97 79  Resp: 18 18 12 16   Temp: 98.2 F (36.8 C) 98.4 F (36.9 C) 98.6 F (37 C) 98.1 F (36.7 C)  TempSrc: Oral Oral Oral Oral  SpO2: 97% 98% 95% 94%  Weight:      Height:       No data found.   Intake/Output Summary (Last 24 hours) at 10/17/2022 1143 Last data filed at 10/17/2022 0915 Gross per 24 hour  Intake 1985.07 ml  Output 15 ml  Net 1970.07 ml   Filed Weights   10/15/22 0747  Weight: 62.1 kg    Exam:   GEN: NAD SKIN: Warm and dry EYES: No pallor or icterus ENT: MMM CV: RRR PULM: CTA B ABD: soft, ND, appropriate  surgical tenderness, +BS, surgical incision is clean, dry and intact CNS: AAO x 3, non focal EXT: No edema or tenderness      Data Reviewed:   I have personally reviewed following labs and imaging studies:  Labs: Labs show the following:   Basic Metabolic Panel: Recent Labs  Lab 10/15/22 0754 10/16/22 0339 10/17/22 0519  NA 132* 135 137  K 4.3 4.8 3.7  CL 93* 100 104  CO2 23 26 24   GLUCOSE 136* 108* 82  BUN 19 14 8   CREATININE 0.84 0.83 0.68  CALCIUM 9.1 7.8* 7.8*  MG  --  1.3* 1.8  PHOS  --  3.7  --    GFR Estimated Creatinine Clearance: 55.9 mL/min (by C-G formula based on SCr of 0.68 mg/dL). Liver Function Tests: Recent Labs  Lab 10/15/22 0754 10/16/22 0339  AST 39 29  ALT 30 24  ALKPHOS 145* 106  BILITOT 1.1 0.7  PROT 7.2 5.7*  ALBUMIN 3.3* 2.6*   Recent Labs  Lab 10/15/22 0754  LIPASE 26   No results for input(s): "AMMONIA" in the last 168 hours. Coagulation profile No results for input(s): "INR", "PROTIME" in the last 168 hours.  CBC: Recent Labs  Lab 10/15/22 0754 10/16/22 0339 10/17/22 0519  WBC 12.0* 9.3 7.5  NEUTROABS 10.4*  --   --   HGB 11.0* 9.2* 9.6*  HCT 33.9* 27.9* 31.0*  MCV 91.6 92.1 94.2  PLT 379 285 258   Cardiac Enzymes: No results for input(s): "CKTOTAL", "CKMB", "CKMBINDEX", "TROPONINI" in the last 168 hours. BNP (last 3 results) No results for input(s): "PROBNP" in the last 8760 hours. CBG: Recent Labs  Lab 10/16/22 1139 10/16/22 1853 10/16/22 2023 10/17/22 0747 10/17/22 1119  GLUCAP 97 80 91 84 165*   D-Dimer: No results for input(s): "DDIMER" in the last 72 hours. Hgb A1c: Recent Labs    10/15/22 0754  HGBA1C 5.2   Lipid Profile: No results for input(s): "CHOL", "HDL", "LDLCALC", "TRIG", "CHOLHDL", "LDLDIRECT" in the last 72 hours. Thyroid function studies: No results for input(s): "TSH", "T4TOTAL", "T3FREE", "THYROIDAB" in the last 72 hours.  Invalid input(s): "FREET3" Anemia work up: No results  for input(s): "VITAMINB12", "FOLATE", "FERRITIN", "TIBC", "IRON", "RETICCTPCT" in the last 72 hours. Sepsis Labs: Recent Labs  Lab 10/15/22 0754 10/15/22 1131 10/15/22 1549 10/16/22 0339 10/17/22 0519  WBC 12.0*  --   --  9.3 7.5  LATICACIDVEN  --  1.5 1.1  --   --  Microbiology No results found for this or any previous visit (from the past 240 hour(s)).  Procedures and diagnostic studies:  DG Abd 1 View  Result Date: 10/15/2022 CLINICAL DATA:  Nasogastric tube placement. EXAM: ABDOMEN - 1 VIEW COMPARISON:  CT earlier today FINDINGS: Tip and side port of the enteric tube below the diaphragm in the stomach. Gaseous distention of small bowel in the left abdomen. Possible external artifact projecting over the right abdomen was not seen on prior CT. IMPRESSION: Tip and side port of the enteric tube below the diaphragm in the stomach. Electronically Signed   By: Narda Rutherford M.D.   On: 10/15/2022 17:09               LOS: 2 days   Akima Slaugh  [facility]   Pager on [email or web address]. If 7PM-7AM, please contact night-coverage at [email or web address]     10/17/2022, 11:43 AM

## 2022-10-17 NOTE — Plan of Care (Signed)

## 2022-10-17 NOTE — Progress Notes (Signed)
Subjective:  CC: Rhonda Fields is a 72 y.o. female  Hospital stay day 2, 2 Days Post-Op s/p ventral hernia repair x2 (5 cm total) for incarcerated ventral hernia with associated SBO.   HPI: No acute issues reported overnight.  States she still has tenderness along the incision site.  Passing flatus, no BM yet.  ROS:  General: Denies weight loss, weight gain, fatigue, fevers, chills, and night sweats. Heart: Denies chest pain, palpitations, racing heart, irregular heartbeat, leg pain or swelling, and decreased activity tolerance. Respiratory: Denies breathing difficulty, shortness of breath, wheezing, cough, and sputum. GI: Denies change in appetite, heartburn, nausea, vomiting, constipation, diarrhea, and blood in stool. GU: Denies difficulty urinating, pain with urinating, urgency, frequency, blood in urine.   Objective:   Temp:  [98.1 F (36.7 C)-98.6 F (37 C)] 98.1 F (36.7 C) (09/28 0744) Pulse Rate:  [76-97] 79 (09/28 0744) Resp:  [12-18] 16 (09/28 0744) BP: (117-133)/(60-70) 128/70 (09/28 0744) SpO2:  [94 %-98 %] 94 % (09/28 0744)     Height: 5\' 2"  (157.5 cm) Weight: 62.1 kg BMI (Calculated): 25.05   Intake/Output this shift:   Intake/Output Summary (Last 24 hours) at 10/17/2022 0947 Last data filed at 10/17/2022 0915 Gross per 24 hour  Intake 1985.07 ml  Output 15 ml  Net 1970.07 ml    Constitutional :  alert, cooperative, appears stated age, and no distress  Respiratory:  clear to auscultation bilaterally  Cardiovascular:  regular rate and rhythm  Gastrointestinal: Soft, no guarding, moderate tenderness to palpation around midline incision site. .   Skin: Cool and moist.  Incision site clean dry and intact.  No erythema, induration, discharge indicate any active infection  Psychiatric: Normal affect, non-agitated, not confused       LABS:     Latest Ref Rng & Units 10/17/2022    5:19 AM 10/16/2022    3:39 AM 10/15/2022    7:54 AM  CMP  Glucose 70 - 99  mg/dL 82  213  086   BUN 8 - 23 mg/dL 8  14  19    Creatinine 0.44 - 1.00 mg/dL 5.78  4.69  6.29   Sodium 135 - 145 mmol/L 137  135  132   Potassium 3.5 - 5.1 mmol/L 3.7  4.8  4.3   Chloride 98 - 111 mmol/L 104  100  93   CO2 22 - 32 mmol/L 24  26  23    Calcium 8.9 - 10.3 mg/dL 7.8  7.8  9.1   Total Protein 6.5 - 8.1 g/dL  5.7  7.2   Total Bilirubin 0.3 - 1.2 mg/dL  0.7  1.1   Alkaline Phos 38 - 126 U/L  106  145   AST 15 - 41 U/L  29  39   ALT 0 - 44 U/L  24  30       Latest Ref Rng & Units 10/17/2022    5:19 AM 10/16/2022    3:39 AM 10/15/2022    7:54 AM  CBC  WBC 4.0 - 10.5 K/uL 7.5  9.3  12.0   Hemoglobin 12.0 - 15.0 g/dL 9.6  9.2  52.8   Hematocrit 36.0 - 46.0 % 31.0  27.9  33.9   Platelets 150 - 400 K/uL 258  285  379     RADS: N/a Assessment:   S/p ventral hernia repair x2 (5 cm total) for incarcerated ventral hernia with associated SBO.  No specific concerns today.  Pain level as expected.  Will advance to clear liquid diet due to report of flatus and continue to monitor.  labs/images/medications/previous chart entries reviewed personally and relevant changes/updates noted above.

## 2022-10-18 DIAGNOSIS — K436 Other and unspecified ventral hernia with obstruction, without gangrene: Secondary | ICD-10-CM | POA: Diagnosis not present

## 2022-10-18 LAB — GLUCOSE, CAPILLARY
Glucose-Capillary: 91 mg/dL (ref 70–99)
Glucose-Capillary: 113 mg/dL — ABNORMAL HIGH (ref 70–99)
Glucose-Capillary: 179 mg/dL — ABNORMAL HIGH (ref 70–99)
Glucose-Capillary: 136 mg/dL — ABNORMAL HIGH (ref 70–99)

## 2022-10-18 NOTE — Progress Notes (Signed)
Subjective:  CC: Rhonda Fields is a 71 y.o. female  Hospital stay day 3, 3 Days Post-Op s/p ventral hernia repair x2 (5 cm total) for incarcerated ventral hernia with associated SBO.   HPI: No acute issues reported overnight.  Feeling better, flatus, but still no BM yet.  Tolerating clears.  ROS:  General: Denies weight loss, weight gain, fatigue, fevers, chills, and night sweats. Heart: Denies chest pain, palpitations, racing heart, irregular heartbeat, leg pain or swelling, and decreased activity tolerance. Respiratory: Denies breathing difficulty, shortness of breath, wheezing, cough, and sputum. GI: Denies change in appetite, heartburn, nausea, vomiting, constipation, diarrhea, and blood in stool. GU: Denies difficulty urinating, pain with urinating, urgency, frequency, blood in urine.   Objective:   Temp:  [98 F (36.7 C)-98.3 F (36.8 C)] 98 F (36.7 C) (09/29 0806) Pulse Rate:  [62-69] 68 (09/29 1631) Resp:  [16-17] 16 (09/29 1631) BP: (100-131)/(60-67) 100/67 (09/29 1631) SpO2:  [96 %-100 %] 98 % (09/29 1631)     Height: 5\' 2"  (157.5 cm) Weight: 62.1 kg BMI (Calculated): 25.05   Intake/Output this shift:   Intake/Output Summary (Last 24 hours) at 10/18/2022 1654 Last data filed at 10/18/2022 1348 Gross per 24 hour  Intake 180 ml  Output 950 ml  Net -770 ml    Constitutional :  alert, cooperative, appears stated age, and no distress  Respiratory:  clear to auscultation bilaterally  Cardiovascular:  regular rate and rhythm  Gastrointestinal: Soft, no guarding, moderate tenderness to palpation around midline incision site. .   Skin: Cool and moist.  Incision site clean dry and intact.  No erythema, induration, discharge indicate any active infection  Psychiatric: Normal affect, non-agitated, not confused       LABS:     Latest Ref Rng & Units 10/17/2022    5:19 AM 10/16/2022    3:39 AM 10/15/2022    7:54 AM  CMP  Glucose 70 - 99 mg/dL 82  782  956   BUN 8  - 23 mg/dL 8  14  19    Creatinine 0.44 - 1.00 mg/dL 2.13  0.86  5.78   Sodium 135 - 145 mmol/L 137  135  132   Potassium 3.5 - 5.1 mmol/L 3.7  4.8  4.3   Chloride 98 - 111 mmol/L 104  100  93   CO2 22 - 32 mmol/L 24  26  23    Calcium 8.9 - 10.3 mg/dL 7.8  7.8  9.1   Total Protein 6.5 - 8.1 g/dL  5.7  7.2   Total Bilirubin 0.3 - 1.2 mg/dL  0.7  1.1   Alkaline Phos 38 - 126 U/L  106  145   AST 15 - 41 U/L  29  39   ALT 0 - 44 U/L  24  30       Latest Ref Rng & Units 10/17/2022    5:19 AM 10/16/2022    3:39 AM 10/15/2022    7:54 AM  CBC  WBC 4.0 - 10.5 K/uL 7.5  9.3  12.0   Hemoglobin 12.0 - 15.0 g/dL 9.6  9.2  46.9   Hematocrit 36.0 - 46.0 % 31.0  27.9  33.9   Platelets 150 - 400 K/uL 258  285  379     RADS: N/a Assessment:   S/p ventral hernia repair x2 (5 cm total) for incarcerated ventral hernia with associated SBO.  No specific concerns today.  Pain improving on cleras.  Will advance to full  liquid diet while waiting for BM.   labs/images/medications/previous chart entries reviewed personally and relevant changes/updates noted above.

## 2022-10-18 NOTE — Plan of Care (Signed)
  Problem: Pain Management: Goal: Pain level will decrease Outcome: Progressing   Problem: Activity: Goal: Ability to ambulate and perform ADLs will improve Outcome: Progressing   

## 2022-10-18 NOTE — Plan of Care (Signed)
  Problem: Activity: Goal: Ability to ambulate and perform ADLs will improve Outcome: Progressing   Problem: Clinical Measurements: Goal: Postoperative complications will be avoided or minimized Outcome: Progressing   Problem: Self-Concept: Goal: Ability to maintain and perform role responsibilities to the fullest extent possible will improve Outcome: Progressing   Problem: Pain Management: Goal: Pain level will decrease Outcome: Progressing   

## 2022-10-18 NOTE — Progress Notes (Signed)
Progress Note    CATRICE BERRIDGE  TGG:[id number] DOB: 1950/03/28  DOA: 10/15/2022 PCP: Donita Brooks, MD      Brief Narrative:    Medical records reviewed and are as summarized below:  Rhonda Fields is a 72 y.o. female with medical history significant for type II DM, anxiety, arrhythmia, chronic headache, depression, GERD, IBS, hearing impairment, who was brought to the hospital because of nausea, vomiting and abdominal pain.  She was admitted to the hospital for small bowel obstruction secondary to incarcerated ventral hernia.  She underwent abdominal hernia repair on 10/15/2022.  Hospitalist team was consulted to assist with medical management.      Assessment/Plan:   Principal Problem:   Incarcerated ventral hernia Active Problems:   DM2 (diabetes mellitus, type 2) (HCC)   HTN (hypertension)    Body mass index is 25.06 kg/m.   Small bowel obstruction secondary to incarcerated ventral hernia: S/p ventral hernia repair on 10/15/2022. She is still on clear liquid diet.  Analgesics as needed for pain.  Follow-up with general surgeon.     Type II DM: NovoLog as needed for hyperglycemia.   Acute anemia, probably acute blood loss anemia from recent surgery: H&H is stable.  Transfuse as needed.   Hypomagnesemia: Improved  Hypertension: BP is optimal but will hold losartan for now.   Diet Order             Diet clear liquid Room service appropriate? Yes; Fluid consistency: Thin  Diet effective now                            Consultants: General surgeon  Procedures: Abdominal hernia repair on 10/15/2022    Medications:    acetaminophen  1,000 mg Oral Q6H   aspirin  81 mg Oral Daily   atorvastatin  40 mg Oral Daily   enoxaparin (LOVENOX) injection  40 mg Subcutaneous Q24H   FLUoxetine  20 mg Oral Daily   insulin aspart  0-15 Units Subcutaneous TID WC   insulin aspart  0-5 Units Subcutaneous QHS   oxybutynin  5 mg  Oral QID   pantoprazole (PROTONIX) IV  40 mg Intravenous QHS   Continuous Infusions:  lactated ringers     promethazine (PHENERGAN) injection (IM or IVPB) Stopped (10/15/22 1229)     Anti-infectives (From admission, onward)    Start     Dose/Rate Route Frequency Ordered Stop   10/16/22 0600  cefOXitin (MEFOXIN) 2 g in sodium chloride 0.9 % 100 mL IVPB  Status:  Discontinued        2 g 200 mL/hr over 30 Minutes Intravenous On call to O.R. 10/15/22 1207 10/15/22 1208   10/15/22 1215  cefOXitin (MEFOXIN) 2 g in sodium chloride 0.9 % 100 mL IVPB        2 g 200 mL/hr over 30 Minutes Intravenous On call to O.R. 10/15/22 1208 10/16/22 0559              Family Communication/Anticipated D/C date and plan/Code Status   DVT prophylaxis: enoxaparin (LOVENOX) injection 40 mg Start: 10/16/22 0800 SCDs Start: 10/15/22 1205     Code Status: Full Code  Family Communication: None Disposition Plan: Plan to discharge to SNF          Subjective:   Interval events noted.  She is tolerating clear liquid diet.  She still has some abdominal pain especially when she moves around.  No  bowel movement yet although she is passing gas.  Objective:    Vitals:   10/17/22 1542 10/17/22 2018 10/18/22 0453 10/18/22 0806  BP: 107/62 119/61 116/65 131/60  Pulse: 75 62 69 62  Resp: 18 16 17 16   Temp: 98.5 F (36.9 C) 98.3 F (36.8 C) 98.1 F (36.7 C) 98 F (36.7 C)  TempSrc: Oral   Oral  SpO2: 100% 100% 96% 100%  Weight:      Height:       No data found.   Intake/Output Summary (Last 24 hours) at 10/18/2022 1204 Last data filed at 10/18/2022 1100 Gross per 24 hour  Intake 180 ml  Output 500 ml  Net -320 ml   Filed Weights   10/15/22 0747  Weight: 62.1 kg    Exam:  GEN: NAD SKIN: Warm and dry EYES: No pallor or icterus ENT: MMM CV: RRR PULM: CTA B ABD: soft, ND, mild surgical tenderness, +BS, surgical incision is clean, dry and intact CNS: AAO x 3, non focal EXT:  No edema or tenderness    Data Reviewed:   I have personally reviewed following labs and imaging studies:  Labs: Labs show the following:   Basic Metabolic Panel: Recent Labs  Lab 10/15/22 0754 10/16/22 0339 10/17/22 0519  NA 132* 135 137  K 4.3 4.8 3.7  CL 93* 100 104  CO2 23 26 24   GLUCOSE 136* 108* 82  BUN 19 14 8   CREATININE 0.84 0.83 0.68  CALCIUM 9.1 7.8* 7.8*  MG  --  1.3* 1.8  PHOS  --  3.7  --    GFR Estimated Creatinine Clearance: 55.9 mL/min (by C-G formula based on SCr of 0.68 mg/dL). Liver Function Tests: Recent Labs  Lab 10/15/22 0754 10/16/22 0339  AST 39 29  ALT 30 24  ALKPHOS 145* 106  BILITOT 1.1 0.7  PROT 7.2 5.7*  ALBUMIN 3.3* 2.6*   Recent Labs  Lab 10/15/22 0754  LIPASE 26   No results for input(s): "AMMONIA" in the last 168 hours. Coagulation profile No results for input(s): "INR", "PROTIME" in the last 168 hours.  CBC: Recent Labs  Lab 10/15/22 0754 10/16/22 0339 10/17/22 0519  WBC 12.0* 9.3 7.5  NEUTROABS 10.4*  --   --   HGB 11.0* 9.2* 9.6*  HCT 33.9* 27.9* 31.0*  MCV 91.6 92.1 94.2  PLT 379 285 258   Cardiac Enzymes: No results for input(s): "CKTOTAL", "CKMB", "CKMBINDEX", "TROPONINI" in the last 168 hours. BNP (last 3 results) No results for input(s): "PROBNP" in the last 8760 hours. CBG: Recent Labs  Lab 10/17/22 1119 10/17/22 1737 10/17/22 2125 10/18/22 0746 10/18/22 1148  GLUCAP 165* 93 95 91 113*   D-Dimer: No results for input(s): "DDIMER" in the last 72 hours. Hgb A1c: No results for input(s): "HGBA1C" in the last 72 hours.  Lipid Profile: No results for input(s): "CHOL", "HDL", "LDLCALC", "TRIG", "CHOLHDL", "LDLDIRECT" in the last 72 hours. Thyroid function studies: No results for input(s): "TSH", "T4TOTAL", "T3FREE", "THYROIDAB" in the last 72 hours.  Invalid input(s): "FREET3" Anemia work up: No results for input(s): "VITAMINB12", "FOLATE", "FERRITIN", "TIBC", "IRON", "RETICCTPCT" in the  last 72 hours. Sepsis Labs: Recent Labs  Lab 10/15/22 0754 10/15/22 1131 10/15/22 1549 10/16/22 0339 10/17/22 0519  WBC 12.0*  --   --  9.3 7.5  LATICACIDVEN  --  1.5 1.1  --   --     Microbiology No results found for this or any previous visit (from the past  240 hour(s)).  Procedures and diagnostic studies:  No results found.             LOS: 3 days   Alioune Hodgkin  [facility]   Pager on [email or web address]. If 7PM-7AM, please contact night-coverage at [email or web address]     10/18/2022, 12:04 PM

## 2022-10-18 NOTE — Progress Notes (Signed)
Mobility Specialist - Progress Note    10/18/22 1459  Mobility  Activity Ambulated independently in hallway;Stood at bedside  Level of Assistance Standby assist, set-up cues, supervision of patient - no hands on  Assistive Device Front wheel walker (+1 assist with chair follow)  Distance Ambulated (ft) 140 ft  Activity Response Tolerated well  Mobility Referral Yes  $Mobility charge 1 Mobility   Pt resting in recliner on RA upon entry. Pt STS and ambulates to hallway SBA with RW around NS. Pt gait is slow, moderately steady taking one-at-a-time steps. Pt returned to room left with needs in reach in recliner. Bed alarm activated.   Johnathan Hausen Mobility Specialist 10/18/22, 3:32 PM

## 2022-10-19 DIAGNOSIS — K436 Other and unspecified ventral hernia with obstruction, without gangrene: Secondary | ICD-10-CM | POA: Diagnosis not present

## 2022-10-19 LAB — GLUCOSE, CAPILLARY
Glucose-Capillary: 108 mg/dL — ABNORMAL HIGH (ref 70–99)
Glucose-Capillary: 151 mg/dL — ABNORMAL HIGH (ref 70–99)
Glucose-Capillary: 81 mg/dL (ref 70–99)
Glucose-Capillary: 118 mg/dL — ABNORMAL HIGH (ref 70–99)

## 2022-10-19 LAB — URINALYSIS, ROUTINE W REFLEX MICROSCOPIC
Bilirubin Urine: NEGATIVE
Glucose, UA: NEGATIVE mg/dL
Hgb urine dipstick: NEGATIVE
Ketones, ur: NEGATIVE mg/dL
Leukocytes,Ua: NEGATIVE
Nitrite: NEGATIVE
Protein, ur: NEGATIVE mg/dL
Specific Gravity, Urine: 1.005 (ref 1.005–1.030)
pH: 6 (ref 5.0–8.0)

## 2022-10-19 MED ORDER — DOCUSATE SODIUM 100 MG PO CAPS
100.0000 mg | ORAL_CAPSULE | Freq: Every day | ORAL | Status: DC
  Administered 2022-10-19 – 2022-10-20 (×2): 100 mg via ORAL
  Filled 2022-10-19 (×2): qty 1

## 2022-10-19 MED ORDER — POLYETHYLENE GLYCOL 3350 17 G PO PACK
17.0000 g | PACK | Freq: Every day | ORAL | Status: DC
  Administered 2022-10-19 – 2022-10-20 (×2): 17 g via ORAL
  Filled 2022-10-19 (×2): qty 1

## 2022-10-19 NOTE — Progress Notes (Signed)
[facility] SURGICAL PROGRESS NOTE  Hospital Day(s): 4.   Post op day(s): 4 Days Post-Op.   Interval History:  Patient seen and examined No acute events or new complaints overnight.  Patient reports she is feeling much better More alert; sitting in chair Abdominal soreness expectedly No fever, chills, nausea, emesis No new labs this morning She is on FLD: tolerating Flatus but no BM yet Working with therapies; likely home with home health   Vital signs in last 24 hours: [min-max] current  Temp:  [97.7 F (36.5 C)-98.3 F (36.8 C)] 97.7 F (36.5 C) (09/30 0322) Pulse Rate:  [62-81] 67 (09/30 0322) Resp:  [16-18] 18 (09/30 0322) BP: (100-131)/(58-72) 109/58 (09/30 0322) SpO2:  [95 %-100 %] 99 % (09/30 0322)     Height: 5\' 2"  (157.5 cm) Weight: 62.1 kg BMI (Calculated): 25.05   Intake/Output last 2 shifts:  09/29 0701 - 09/30 0700 In: 180 [P.O.:180] Out: 1950 [Urine:1950]   Physical Exam:  Constitutional: alert, cooperative and no distress  Respiratory: breathing non-labored at rest  Cardiovascular: regular rate and sinus rhythm  Gastrointestinal: soft, incisional tenderness, and non-distended, no rebound/guarding Integumentary: midline incision is CDI with dermabond, no erythema, no drainage   Labs:     Latest Ref Rng & Units 10/17/2022    5:19 AM 10/16/2022    3:39 AM 10/15/2022    7:54 AM  CBC  WBC 4.0 - 10.5 K/uL 7.5  9.3  12.0   Hemoglobin 12.0 - 15.0 g/dL 9.6  9.2  95.6   Hematocrit 36.0 - 46.0 % 31.0  27.9  33.9   Platelets 150 - 400 K/uL 258  285  379       Latest Ref Rng & Units 10/17/2022    5:19 AM 10/16/2022    3:39 AM 10/15/2022    7:54 AM  CMP  Glucose 70 - 99 mg/dL 82  387  564   BUN 8 - 23 mg/dL 8  14  19    Creatinine 0.44 - 1.00 mg/dL 3.32  9.51  8.84   Sodium 135 - 145 mmol/L 137  135  132   Potassium 3.5 - 5.1 mmol/L 3.7  4.8  4.3   Chloride 98 - 111 mmol/L 104  100  93   CO2 22 - 32 mmol/L 24  26  23    Calcium 8.9 - 10.3  mg/dL 7.8  7.8  9.1   Total Protein 6.5 - 8.1 g/dL  5.7  7.2   Total Bilirubin 0.3 - 1.2 mg/dL  0.7  1.1   Alkaline Phos 38 - 126 U/L  106  145   AST 15 - 41 U/L  29  39   ALT 0 - 44 U/L  24  30      Imaging studies: No new pertinent imaging studies   Assessment/Plan:  72 y.o. female 4 Days Post-Op s/p ventral hernia repair x2 (5 cm total) for incarcerated ventral hernia with associated SBO.   - Will advance to soft diet  - Add bowel regimen - Monitor abdominal examination; on-going bowel function - Pain control prn; antiemetics prn - Mobilize; PT following  - utilize abdominal binder   - Appreciate medicine support with comorbidities; SSI     - Discharge Planning: She is doing much better; diet advancing. Will add bowel regimen. Home health and DME orders placed. Anticipate home tomorrow (10/01) morning.   All of the above findings and recommendations were discussed with the patient, and the medical team,  and all of patient's questions were answered to her expressed satisfaction.  -- Lynden Oxford, PA-C [facility] 10/19/2022, 7:09 AM M-F: 7am - 4pm

## 2022-10-19 NOTE — Care Management Important Message (Signed)
Important Message  Patient Details  Name: Rhonda Fields MRN: [id number] Date of Birth: 09/03/1950   Important Message Given:  N/A - LOS <3 / Initial given by admissions     [name] 10/19/2022, 12:04 PM

## 2022-10-19 NOTE — Progress Notes (Signed)
Physical Therapy Treatment Patient Details Name: Rhonda Fields MRN: [id number] DOB: December 18, 1950 Today's Date: 10/19/2022   History of Present Illness presented to ER from STR secondary to persistent nausea, abdominal pain; admitted for management of umbilical hernia with associated SBO, s/p ventral hernia repair (9/26).  Of note, recent PMH significant for R hip fracture s/p IMN (9/4), PWB 25%    PT Comments  Patient in recliner on arrival and agreeable to PT treatment session. Ambulatory to bathroom with RW and supervision then into hallway with good adherence to [facility] on R LE. Eager to discharge home with her "best friend" and husband who are available to assist. Discharge plan remains appropriate although patient requesting to discharge home. Equipment recommendations updated.     If plan is discharge home, recommend the following: A little help with walking and/or transfers;A little help with bathing/dressing/bathroom   Can travel by private vehicle        Equipment Recommendations  Rolling Mikele Sifuentes (2 wheels);Wheelchair (measurements PT);Wheelchair cushion (measurements PT);BSC/3in1    Recommendations for Other Services       Precautions / Restrictions Precautions Precautions: Fall Restrictions Weight Bearing Restrictions: Yes RLE Weight Bearing: Partial weight bearing RLE Partial Weight Bearing Percentage or Pounds: 25%     Mobility  Bed Mobility               General bed mobility comments: in recliner on arrival and at end of session    Transfers Overall transfer level: Needs assistance Equipment used: Rolling Jahson Emanuele (2 wheels) Transfers: Sit to/from Stand Sit to Stand: Supervision                Ambulation/Gait Ambulation/Gait assistance: Supervision Gait Distance (Feet): 100 Feet Assistive device: Rolling Myca Perno (2 wheels) Gait Pattern/deviations: Step-to pattern, Decreased stride length Gait velocity: decreased     General Gait Details:  supervision for safety. Good adherence to PWB on R LE   Stairs             Wheelchair Mobility     Tilt Bed    Modified Rankin (Stroke Patients Only)       Balance Overall balance assessment: Mild deficits observed, not formally tested                                          Cognition Arousal: Alert Behavior During Therapy: WFL for tasks assessed/performed Overall Cognitive Status: Within Functional Limits for tasks assessed                                          Exercises      General Comments        Pertinent Vitals/Pain Pain Assessment Pain Assessment: Faces Faces Pain Scale: Hurts little more Pain Location: RLE Pain Descriptors / Indicators: Grimacing, Guarding, Sharp, Crying Pain Intervention(s): Monitored during session, Limited activity within patient's tolerance, Repositioned    Home Living                          Prior Function            PT Goals (current goals can now be found in the care plan section) Acute Rehab PT Goals Patient Stated Goal: to get stronger PT Goal Formulation: With patient Time For Goal  Achievement: 10/30/22 Potential to Achieve Goals: Good Progress towards PT goals: Progressing toward goals    Frequency    Min 1X/week      PT Plan      Co-evaluation              AM-PAC PT "6 Clicks" Mobility   Outcome Measure  Help needed turning from your back to your side while in a flat bed without using bedrails?: None Help needed moving from lying on your back to sitting on the side of a flat bed without using bedrails?: None Help needed moving to and from a bed to a chair (including a wheelchair)?: A Little Help needed standing up from a chair using your arms (e.g., wheelchair or bedside chair)?: A Little Help needed to walk in hospital room?: A Little Help needed climbing 3-5 steps with a railing? : A Little 6 Click Score: 20    End of Session   Activity  Tolerance: Patient tolerated treatment well Patient left: in chair;with call bell/phone within reach Nurse Communication: Mobility status PT Visit Diagnosis: Other abnormalities of gait and mobility (R26.89);Muscle weakness (generalized) (M62.81);Pain;Difficulty in walking, not elsewhere classified (R26.2) Pain - Right/Left: Right Pain - part of body: Hip;Leg     Time: 4098-1191 PT Time Calculation (min) (ACUTE ONLY): 30 min  Charges:    $Gait Training: 8-22 mins $Therapeutic Activity: 8-22 mins PT General Charges $$ ACUTE PT VISIT: 1 Visit                     Maylon Peppers, PT, DPT Physical Therapist - [facility]  [facility]    Chakara Bognar A Brenisha Tsui 10/19/2022, 12:10 PM

## 2022-10-19 NOTE — Plan of Care (Signed)
  Problem: Education: Goal: Verbalization of understanding the information provided (i.e., activity precautions, restrictions, etc) will improve Outcome: Progressing Goal: Individualized Educational Video(s) Outcome: Progressing   Problem: Activity: Goal: Ability to ambulate and perform ADLs will improve Outcome: Progressing   Problem: Clinical Measurements: Goal: Postoperative complications will be avoided or minimized Outcome: Progressing   Problem: Self-Concept: Goal: Ability to maintain and perform role responsibilities to the fullest extent possible will improve Outcome: Progressing   Problem: Pain Management: Goal: Pain level will decrease Outcome: Progressing   Problem: Education: Goal: Ability to describe self-care measures that may prevent or decrease complications (Diabetes Survival Skills Education) will improve Outcome: Progressing Goal: Individualized Educational Video(s) Outcome: Progressing   Problem: Coping: Goal: Ability to adjust to condition or change in health will improve Outcome: Progressing   Problem: Fluid Volume: Goal: Ability to maintain a balanced intake and output will improve Outcome: Progressing   Problem: Health Behavior/Discharge Planning: Goal: Ability to identify and utilize available resources and services will improve Outcome: Progressing Goal: Ability to manage health-related needs will improve Outcome: Progressing   Problem: Metabolic: Goal: Ability to maintain appropriate glucose levels will improve Outcome: Progressing   Problem: Nutritional: Goal: Maintenance of adequate nutrition will improve Outcome: Progressing Goal: Progress toward achieving an optimal weight will improve Outcome: Progressing   Problem: Skin Integrity: Goal: Risk for impaired skin integrity will decrease Outcome: Progressing   Problem: Tissue Perfusion: Goal: Adequacy of tissue perfusion will improve Outcome: Progressing   Problem:  Education: Goal: Knowledge of General Education information will improve Description: Including pain rating scale, medication(s)/side effects and non-pharmacologic comfort measures Outcome: Progressing   Problem: Health Behavior/Discharge Planning: Goal: Ability to manage health-related needs will improve Outcome: Progressing   Problem: Clinical Measurements: Goal: Ability to maintain clinical measurements within normal limits will improve Outcome: Progressing Goal: Will remain free from infection Outcome: Progressing Goal: Diagnostic test results will improve Outcome: Progressing Goal: Respiratory complications will improve Outcome: Progressing Goal: Cardiovascular complication will be avoided Outcome: Progressing   Problem: Activity: Goal: Risk for activity intolerance will decrease Outcome: Progressing   Problem: Nutrition: Goal: Adequate nutrition will be maintained Outcome: Progressing   Problem: Elimination: Goal: Will not experience complications related to bowel motility Outcome: Progressing Goal: Will not experience complications related to urinary retention Outcome: Progressing   Problem: Pain Managment: Goal: General experience of comfort will improve Outcome: Progressing   Problem: Skin Integrity: Goal: Risk for impaired skin integrity will decrease Outcome: Progressing   Problem: Safety: Goal: Ability to remain free from injury will improve Outcome: Progressing

## 2022-10-19 NOTE — Progress Notes (Signed)
Progress Note    Rhonda Fields  ZOX:[id number] DOB: 09/23/1950  DOA: 10/15/2022 PCP: Donita Brooks, MD      Brief Narrative:    Medical records reviewed and are as summarized below:  Rhonda Fields is a 71 y.o. female with medical history significant for type II DM, anxiety, arrhythmia, chronic headache, depression, GERD, IBS, hearing impairment, who was brought to the hospital because of nausea, vomiting and abdominal pain.  She was admitted to the hospital for small bowel obstruction secondary to incarcerated ventral hernia.  She underwent abdominal hernia repair on 10/15/2022.  Hospitalist team was consulted to assist with medical management.      Assessment/Plan:   Principal Problem:   Incarcerated ventral hernia Active Problems:   DM2 (diabetes mellitus, type 2) (HCC)   HTN (hypertension)    Body mass index is 25.06 kg/m.   Small bowel obstruction secondary to incarcerated ventral hernia: S/p ventral hernia repair on 10/15/2022.  Diet has been advanced to soft diet. Follow-up with general surgeon.     Type II DM: NovoLog as needed for hyperglycemia.   Acute anemia, probably acute blood loss anemia from recent surgery: H&H is stable.  Transfuse as needed.   Hypomagnesemia: Improved   Hypertension: Continue to hold losartan because BP is soft.   Diet Order             DIET SOFT Fluid consistency: Thin  Diet effective now                            Consultants: General surgeon  Procedures: Abdominal hernia repair on 10/15/2022    Medications:    acetaminophen  1,000 mg Oral Q6H   aspirin  81 mg Oral Daily   atorvastatin  40 mg Oral Daily   docusate sodium  100 mg Oral Daily   enoxaparin (LOVENOX) injection  40 mg Subcutaneous Q24H   insulin aspart  0-15 Units Subcutaneous TID WC   insulin aspart  0-5 Units Subcutaneous QHS   oxybutynin  5 mg Oral QID   pantoprazole (PROTONIX) IV  40 mg Intravenous QHS    polyethylene glycol  17 g Oral Daily   Continuous Infusions:  promethazine (PHENERGAN) injection (IM or IVPB) Stopped (10/15/22 1229)     Anti-infectives (From admission, onward)    Start     Dose/Rate Route Frequency Ordered Stop   10/16/22 0600  cefOXitin (MEFOXIN) 2 g in sodium chloride 0.9 % 100 mL IVPB  Status:  Discontinued        2 g 200 mL/hr over 30 Minutes Intravenous On call to O.R. 10/15/22 1207 10/15/22 1208   10/15/22 1215  cefOXitin (MEFOXIN) 2 g in sodium chloride 0.9 % 100 mL IVPB        2 g 200 mL/hr over 30 Minutes Intravenous On call to O.R. 10/15/22 1208 10/16/22 0559              Family Communication/Anticipated D/C date and plan/Code Status   DVT prophylaxis: enoxaparin (LOVENOX) injection 40 mg Start: 10/16/22 0800 SCDs Start: 10/15/22 1205     Code Status: Full Code  Family Communication: None Disposition Plan: Plan to discharge to SNF          Subjective:   Interval events noted.  She says she is passing a lot of gas but has not had any bowel movement.  Abdominal pain is better.  No vomiting.  She is  hoping she can "go home" tomorrow.  Objective:    Vitals:   10/18/22 1631 10/18/22 2020 10/19/22 0322 10/19/22 0808  BP: 100/67 110/72 (!) 109/58 (!) 108/52  Pulse: 68 81 67 69  Resp: 16 18 18 18   Temp:  98.3 F (36.8 C) 97.7 F (36.5 C) 98.3 F (36.8 C)  TempSrc:  Oral  Oral  SpO2: 98% 95% 99% 96%  Weight:      Height:       No data found.   Intake/Output Summary (Last 24 hours) at 10/19/2022 1422 Last data filed at 10/19/2022 1200 Gross per 24 hour  Intake 480 ml  Output 1300 ml  Net -820 ml   Filed Weights   10/15/22 0747  Weight: 62.1 kg    Exam:  GEN: NAD, sitting up in the chair SKIN: Warm and dry EYES: No pallor or icterus ENT: MMM CV: RRR PULM: CTA B ABD: soft, ND, NT, +BS, surgical incisional wound is clean, dry and intact CNS: AAO x 3, non focal EXT: No edema or tenderness   Data Reviewed:    I have personally reviewed following labs and imaging studies:  Labs: Labs show the following:   Basic Metabolic Panel: Recent Labs  Lab 10/15/22 0754 10/16/22 0339 10/17/22 0519  NA 132* 135 137  K 4.3 4.8 3.7  CL 93* 100 104  CO2 23 26 24   GLUCOSE 136* 108* 82  BUN 19 14 8   CREATININE 0.84 0.83 0.68  CALCIUM 9.1 7.8* 7.8*  MG  --  1.3* 1.8  PHOS  --  3.7  --    GFR Estimated Creatinine Clearance: 55.9 mL/min (by C-G formula based on SCr of 0.68 mg/dL). Liver Function Tests: Recent Labs  Lab 10/15/22 0754 10/16/22 0339  AST 39 29  ALT 30 24  ALKPHOS 145* 106  BILITOT 1.1 0.7  PROT 7.2 5.7*  ALBUMIN 3.3* 2.6*   Recent Labs  Lab 10/15/22 0754  LIPASE 26   No results for input(s): "AMMONIA" in the last 168 hours. Coagulation profile No results for input(s): "INR", "PROTIME" in the last 168 hours.  CBC: Recent Labs  Lab 10/15/22 0754 10/16/22 0339 10/17/22 0519  WBC 12.0* 9.3 7.5  NEUTROABS 10.4*  --   --   HGB 11.0* 9.2* 9.6*  HCT 33.9* 27.9* 31.0*  MCV 91.6 92.1 94.2  PLT 379 285 258   Cardiac Enzymes: No results for input(s): "CKTOTAL", "CKMB", "CKMBINDEX", "TROPONINI" in the last 168 hours. BNP (last 3 results) No results for input(s): "PROBNP" in the last 8760 hours. CBG: Recent Labs  Lab 10/18/22 1148 10/18/22 1630 10/18/22 2150 10/19/22 0806 10/19/22 1308  GLUCAP 113* 179* 136* 108* 151*   D-Dimer: No results for input(s): "DDIMER" in the last 72 hours. Hgb A1c: No results for input(s): "HGBA1C" in the last 72 hours.  Lipid Profile: No results for input(s): "CHOL", "HDL", "LDLCALC", "TRIG", "CHOLHDL", "LDLDIRECT" in the last 72 hours. Thyroid function studies: No results for input(s): "TSH", "T4TOTAL", "T3FREE", "THYROIDAB" in the last 72 hours.  Invalid input(s): "FREET3" Anemia work up: No results for input(s): "VITAMINB12", "FOLATE", "FERRITIN", "TIBC", "IRON", "RETICCTPCT" in the last 72 hours. Sepsis Labs: Recent Labs   Lab 10/15/22 0754 10/15/22 1131 10/15/22 1549 10/16/22 0339 10/17/22 0519  WBC 12.0*  --   --  9.3 7.5  LATICACIDVEN  --  1.5 1.1  --   --     Microbiology No results found for this or any previous visit (from the past  240 hour(s)).  Procedures and diagnostic studies:  No results found.             LOS: 4 days   Karlton Maya  [facility]   Pager on [email or web address]. If 7PM-7AM, please contact night-coverage at [email or web address]     10/19/2022, 2:22 PM

## 2022-10-20 LAB — GLUCOSE, CAPILLARY: Glucose-Capillary: 182 mg/dL — ABNORMAL HIGH (ref 70–99)

## 2022-10-20 MED ORDER — IBUPROFEN 600 MG PO TABS: 600.0000 mg | ORAL_TABLET | Freq: Four times a day (QID) | ORAL | 0 refills | Status: DC | PRN

## 2022-10-20 MED ORDER — OXYCODONE HCL 5 MG PO TABS: 5.0000 mg | ORAL_TABLET | Freq: Four times a day (QID) | ORAL | 0 refills | Status: DC | PRN

## 2022-10-20 MED ORDER — ONDANSETRON 4 MG PO TBDP: 4.0000 mg | ORAL_TABLET | Freq: Four times a day (QID) | ORAL | 0 refills | Status: AC | PRN

## 2022-10-20 NOTE — Plan of Care (Signed)
  Problem: Education: Goal: Verbalization of understanding the information provided (i.e., activity precautions, restrictions, etc) will improve Outcome: Progressing Goal: Individualized Educational Video(s) Outcome: Progressing   Problem: Activity: Goal: Ability to ambulate and perform ADLs will improve Outcome: Progressing   Problem: Clinical Measurements: Goal: Postoperative complications will be avoided or minimized Outcome: Progressing   Problem: Self-Concept: Goal: Ability to maintain and perform role responsibilities to the fullest extent possible will improve Outcome: Progressing   Problem: Pain Management: Goal: Pain level will decrease Outcome: Progressing   Problem: Education: Goal: Ability to describe self-care measures that may prevent or decrease complications (Diabetes Survival Skills Education) will improve Outcome: Progressing Goal: Individualized Educational Video(s) Outcome: Progressing   Problem: Coping: Goal: Ability to adjust to condition or change in health will improve Outcome: Progressing   Problem: Fluid Volume: Goal: Ability to maintain a balanced intake and output will improve Outcome: Progressing   Problem: Health Behavior/Discharge Planning: Goal: Ability to identify and utilize available resources and services will improve Outcome: Progressing Goal: Ability to manage health-related needs will improve Outcome: Progressing   Problem: Metabolic: Goal: Ability to maintain appropriate glucose levels will improve Outcome: Progressing   Problem: Nutritional: Goal: Maintenance of adequate nutrition will improve Outcome: Progressing Goal: Progress toward achieving an optimal weight will improve Outcome: Progressing   Problem: Skin Integrity: Goal: Risk for impaired skin integrity will decrease Outcome: Progressing   Problem: Tissue Perfusion: Goal: Adequacy of tissue perfusion will improve Outcome: Progressing   Problem:  Education: Goal: Knowledge of General Education information will improve Description: Including pain rating scale, medication(s)/side effects and non-pharmacologic comfort measures Outcome: Progressing   Problem: Health Behavior/Discharge Planning: Goal: Ability to manage health-related needs will improve Outcome: Progressing   Problem: Clinical Measurements: Goal: Ability to maintain clinical measurements within normal limits will improve Outcome: Progressing Goal: Will remain free from infection Outcome: Progressing Goal: Diagnostic test results will improve Outcome: Progressing Goal: Respiratory complications will improve Outcome: Progressing Goal: Cardiovascular complication will be avoided Outcome: Progressing   Problem: Activity: Goal: Risk for activity intolerance will decrease Outcome: Progressing   Problem: Nutrition: Goal: Adequate nutrition will be maintained Outcome: Progressing   Problem: Coping: Goal: Level of anxiety will decrease Outcome: Progressing   Problem: Elimination: Goal: Will not experience complications related to bowel motility Outcome: Progressing Goal: Will not experience complications related to urinary retention Outcome: Progressing   Problem: Pain Managment: Goal: General experience of comfort will improve Outcome: Progressing   Problem: Safety: Goal: Ability to remain free from injury will improve Outcome: Progressing   Problem: Skin Integrity: Goal: Risk for impaired skin integrity will decrease Outcome: Progressing   

## 2022-10-20 NOTE — TOC Transition Note (Signed)
Transition of Care [facility]) - CM/SW Discharge Note   Patient Details  Name: Rhonda Fields MRN: [id number] Date of Birth: 02/24/1950  [facility] CM/SW Contact:  Darolyn Rua, LCSW Phone Number: 10/20/2022, 8:57 AM   Clinical Narrative:     Patient to discharge home with [facility] PT today (patient declined snf).   She reports she believes [facility] had previously ordered her a wheelchair but is unsure.   RW and 3in1 ordered through Jon with [employer] for bedside delivery, he will follow up on the wheelchair.   No additional discharge needs at this time, family to pick up at dc.     Final next level of care: Home w Home Health Services Barriers to Discharge: No Barriers Identified   Patient Goals and CMS Choice CMS Medicare.gov Compare Post Acute Care list provided to:: Patient Choice offered to / list presented to : Patient  Discharge Placement                         Discharge Plan and Services Additional resources added to the After Visit Summary for       Post Acute Care Choice: Home Health          DME Arranged: 3-N-1, Walker rolling   Date DME Agency Contacted: 10/20/22 Time DME Agency Contacted: [phone] Representative spoke with at DME Agency: Osvaldo Angst Arranged: PT HH Agency: [facility] Date [facility] Agency Contacted: 10/20/22 Time HH Agency Contacted: [phone] Representative spoke with at [facility] Agency: Kandee Keen  Social Determinants of Health (SDOH) Interventions SDOH Screenings   Food Insecurity: No Food Insecurity (10/15/2022)  Housing: Low Risk  (10/15/2022)  Transportation Needs: No Transportation Needs (10/15/2022)  Utilities: Not At Risk (10/15/2022)  Alcohol Screen: Low Risk  (03/06/2022)  Depression (PHQ2-9): Low Risk  (08/04/2022)  Financial Resource Strain: Low Risk  (03/06/2022)  Physical Activity: Insufficiently Active (03/06/2022)  Social Connections: Socially Isolated (03/06/2022)  Stress: No Stress Concern Present (03/06/2022)   Tobacco Use: High Risk (10/15/2022)     Readmission Risk Interventions     No data to display

## 2022-10-20 NOTE — Discharge Summary (Signed)
[facility] SURGICAL DISCHARGE SUMMARY  Patient ID: Rhonda Fields MRN: [id number] DOB/AGE: April 10, 1950 72 y.o.  Admit date: 10/15/2022 Discharge date: 10/20/2022  Discharge Diagnoses Patient Active Problem List   Diagnosis Date Noted   Incarcerated ventral hernia 10/15/2022    Consultants None  Procedures 10/15/2022:Incarcerated ventral Hernia Repair x2 (total 5 cm)  HPI: 72 y.o. female presented to [facility] ED today for nausea/emesis and concern over care at [facility]. Patient reports she has had significant nausea, foul smelling emesis, and intermittent abdominal pain since Tuesday of this week. She was getting "something under the tongue" at [facility] without improvement. Abdominal pain primarily located near umbilicus. She denied any associated fever, chills, CP, SOB. She is passing a small amount of flatus. No BM since Tuesday. She has a known history of this umbilical hernia for a while now. She has been following with her PCP for this but it has always previously been reducible. Of note, she is currently at [facility] secondary to a right hip fracture surgically repaired at [facility] on 09/04. She was discharged to [facility] on 09/09 with 14 days of Lovenox. Otherwise, she is not on any anticoagulation. Previous intra-abdominal surgeries positive for open cholecystectomy and hysterectomy. Work up in the ED revealed a leukocytosis to 12.0K, Hgb to 11.0, sCr - 0.84, hyponatremia to 132. Venous lactate is pending. CT Abdomen/Pelvis is concerning for umbilical hernia with associated small bowel obstruction. She has also been tachycardic in the ED.   Hospital Course: Informed consent was obtained and documented, and patient underwent uneventful ventral hernia repair (total 5 cm) (Dr Maurine Minister, 10/15/2022).  Post-operatively, patient did well. NGT removed on POD1 after passing clamping trial. Advancement of patient's diet and ambulation were well-tolerated. The remainder of patient's hospital course was  essentially unremarkable, and discharge planning was initiated accordingly with patient safely able to be discharged home with appropriate discharge instructions, pain control, and outpatient follow-up after all of her questions were answered to her expressed satisfaction.   Discharge Condition: Good   Physical Examination:  Constitutional: alert, cooperative and no distress  Respiratory: breathing non-labored at rest  Cardiovascular: regular rate and sinus rhythm  Gastrointestinal: soft, incisional tenderness, and non-distended, no rebound/guarding Integumentary: midline incision is CDI with dermabond, no erythema, no drainage    Allergies as of 10/20/2022       Reactions   Ivp Dye [iodinated Contrast Media] Itching, Nausea And Vomiting   Mobic [meloxicam] Other (See Comments)   Lower extremity swelling   Penicillins Hives, Itching        Medication List     TAKE these medications    alendronate 70 MG tablet Commonly known as: FOSAMAX TAKE 1 TABLET EVERY 7 DAYS WITH A FULL GLASS OF WATER ON AN EMPTY STOMACH What changed: See the new instructions.   aspirin EC 81 MG tablet Take 81 mg by mouth daily.   atorvastatin 40 MG tablet Commonly known as: LIPITOR TAKE 1 TABLET EVERY DAY   CALCIUM + VITAMIN D3 PO Take 1 tablet by mouth daily.   CINNAMON PO Take 1 capsule by mouth daily.   docusate sodium 100 MG capsule Commonly known as: COLACE Take 1 capsule (100 mg total) by mouth 2 (two) times daily as needed for mild constipation.   DropSafe Alcohol Prep 70 % Pads USE AS DIRECTED PRIOR TO MONITORING BLOOD GLUCOSE UP TO THREE TIMES DAILY   FISH OIL PO Take 2,000 mg by mouth daily.   FLUoxetine 20 MG capsule Commonly known as: PROZAC  Take 1 capsule (20 mg total) by mouth daily.   fluticasone 50 MCG/ACT nasal spray Commonly known as: FLONASE USE 2 SPRAYS IN EACH NOSTRIL EVERY DAY What changed:  when to take this reasons to take this   glucose blood test  strip Use as instructed   ibuprofen 600 MG tablet Commonly known as: ADVIL Take 1 tablet (600 mg total) by mouth every 6 (six) hours as needed.   losartan 25 MG tablet Commonly known as: COZAAR TAKE 1 TABLET EVERY DAY   metFORMIN 1000 MG tablet Commonly known as: GLUCOPHAGE Courtesy refill. Pt need appt w/pcp for future refills.  TAKE 1 TABLET TWICE DAILY WITH MEALS   ondansetron 4 MG disintegrating tablet Commonly known as: ZOFRAN-ODT Take 1 tablet (4 mg total) by mouth every 6 (six) hours as needed for nausea.   oxybutynin 5 MG tablet Commonly known as: DITROPAN TAKE 1 TABLET FOUR TIMES DAILY What changed: See the new instructions.   oxyCODONE 5 MG immediate release tablet Commonly known as: Oxy IR/ROXICODONE Take 1 tablet (5 mg total) by mouth every 6 (six) hours as needed for severe pain or breakthrough pain. What changed:  how much to take when to take this reasons to take this   pantoprazole 40 MG tablet Commonly known as: PROTONIX TAKE 1 TABLET TWICE DAILY   pioglitazone 30 MG tablet Commonly known as: ACTOS TAKE 1 TABLET EVERY DAY   polyethylene glycol 17 g packet Commonly known as: MIRALAX / GLYCOLAX Take 17 g by mouth daily as needed for moderate constipation or severe constipation.   triamcinolone cream 0.1 % Commonly known as: KENALOG Apply topically 2 (two) times daily. What changed:  how much to take when to take this reasons to take this   True Metrix Level 1 Low Soln Use as directed to monitor FSBS 1x daily. Dx: E11.9   True Metrix Meter w/Device Kit USE AS DIRECTED   TRUEplus Lancets 33G Misc Use as directed to monitor FSBS 1x daily. Dx: E11.9   VITAMIN C PO Take 1 tablet by mouth daily.   ZINC PO Take 1 tablet by mouth daily.               Durable Medical Equipment  (From admission, onward)           Start     Ordered   10/19/22 0737  For home use only DME Walker rolling  Once       Question Answer Comment   Walker: With 5 Inch Wheels   Patient needs a walker to treat with the following condition Physical deconditioning      10/19/22 0736   10/19/22 0736  For home use only DME Bedside commode  Once       Question:  Patient needs a bedside commode to treat with the following condition  Answer:  Physical deconditioning   10/19/22 0736              Follow-up Information     Kandis Cocking, MD. Go on 11/05/2022.   Specialty: General Surgery Why: Go to appointment on 10/17 at 130 PM Contact information: [address] [place] [address] [phone]                  Time spent on discharge management including discussion of hospital course, clinical condition, outpatient instructions, prescriptions, and follow up with the patient and members of the medical team: >30 minutes  -- Lynden Oxford , PA-C [facility]  10/20/2022, 7:35 AM [phone] M-F: 7am - 4pm

## 2022-10-20 NOTE — Progress Notes (Signed)
    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  A walker will not resolve issue with performing activities of daily living. A wheelchair will allow patient to safely perform daily activities. Patient can safely propel the wheelchair in the home or has a caregiver who can provide assistance. Length of need 12 months . Accessories: elevating leg rests (ELRs), wheel locks, extensions and anti-tippers.   10/20/22 1200   10/19/22 0737  For home use only DME Walker rolling  Once       Question Answer Comment  Walker: With 5 Inch Wheels   Patient needs a walker to treat with the following condition Physical deconditioning      10/19/22 0736   10/19/22 0736  For home use only DME Bedside commode  Once       Question:  Patient needs a bedside commode to treat with the following condition  Answer:  Physical deconditioning   10/19/22 0736            Darolyn Rua, LCSW, MSW, [place] [phone]

## 2022-10-20 NOTE — Discharge Instructions (Signed)
In addition to included general post-operative instructions,  Diet: Resume home diet.   Activity: No heavy lifting >20 pounds (children, pets, laundry, garbage) or strenuous activity for 6 weeks from date of surgery, but light activity and walking are encouraged. Do not drive or drink alcohol if taking narcotic pain medications or having pain that might distract from driving. Please utilize abdominal binder at home. Okay to work with therapies.   Wound care: You may shower/get incision wet with soapy water and pat dry (do not rub incisions), but no baths or submerging incision underwater until follow-up.   Medications: Resume all home medications. For mild to moderate pain: acetaminophen (Tylenol) or ibuprofen/naproxen (if no kidney disease). Combining Tylenol with alcohol can substantially increase your risk of causing liver disease. Narcotic pain medications, if prescribed, can be used for severe pain, though may cause nausea, constipation, and drowsiness. Do not combine Tylenol and Percocet (or similar) within a 6 hour period as Percocet (and similar) contain(s) Tylenol. If you do not need the narcotic pain medication, you do not need to fill the prescription.  Call office [phone] / [phone]) at any time if any questions, worsening pain, fevers/chills, bleeding, drainage from incision site, or other concerns.

## 2022-10-20 NOTE — Consult Note (Signed)
[facility] Customer service manager [facility]) Accountable Care Organization (ACO) [facility] Liaison Note  10/20/2022  MANDOLIN FALWELL [birth year] [id number]  Location: [facility] RN Hospital Liaison met patient at bedside at [facility].  Insurance: [facility] HMO   Rhonda Fields is a 72 y.o. female who is a Primary Care Patient of Pickard, Priscille Heidelberg, MD [facility]). The patient was screened for 30 day readmission hospitalization with noted low risk score for unplanned readmission risk with 2 IP in 6 months.  The patient was assessed for potential [facility]) Care Management service needs for post hospital transition for care coordination. Review of patient's electronic medical record reveals patient was admitted for Incarcerated Ventral Hernia. Liaison spoke with pt at bedside and pt requested liaison to remove the face mask so she could "read my lips" when speaking due to her [facility] Liaison removed mask and spoke alittle louder for pt to hear with the introduction and educated pt on community care management services. Pt declined with no needs however had lots of questions concerning post discharged services. TOC team member available and liaison and TOC spoke with pt again at bedside as all questions were addressed with no additional needs.   Plan: [facility] Liaison will continue to follow progress and disposition to asess for post hospital community care coordination/management needs.  Referral request for community care coordination: pending disposition.   [facility] Care Management/Population Health does not replace or interfere with any arrangements made by the Inpatient Transition of Care team.   For questions contact:   Elliot Cousin, RN, [facility] Liaison [place]   Population Health Office Hours MTWF  8:00 am-6:00 pm [phone] mobile [phone] [Office toll free line] Office Hours are M-F 8:30 - 5  pm [email or web address]

## 2022-10-20 NOTE — Progress Notes (Signed)
Patient is not able to walk the distance required to go the bathroom, or he/she is unable to safely negotiate stairs required to access the bathroom.  A 3in1 BSC will alleviate this problem  Wanda Cellucci Holcomb, LCSW, MSW, MHA [phone]  

## 2022-10-21 ENCOUNTER — Telehealth: Payer: Self-pay | Admitting: Family Medicine

## 2022-10-21 NOTE — Transitions of Care (Post Inpatient/ED Visit) (Signed)
10/21/2022  Name: BRENNEN BRINGER MRN: [id number] DOB: 1950-05-08  Today's TOC FU Call Status: Today's TOC FU Call Status:: Unsuccessful Call (1st Attempt)  Attempted to reach the patient regarding the most recent Inpatient/ED visit.  Follow Up Plan: Additional outreach attempts will be made to reach the patient to complete the Transitions of Care (Post Inpatient/ED visit) call.   Signature [employer], RN RN CARE MANAGER VBCI-POPULATION [phone]

## 2022-10-22 ENCOUNTER — Telehealth: Payer: Self-pay | Admitting: Family Medicine

## 2022-10-22 NOTE — Transitions of Care (Post Inpatient/ED Visit) (Signed)
10/22/2022  Name: Rhonda Fields MRN: [id number] DOB: 1950-09-29  Today's TOC FU Call Status: Today's TOC FU Call Status:: Successful TOC FU Call Completed TOC FU Call Complete Date: 10/22/22 Patient's Name and Date of Birth confirmed.  Transition Care Management Follow-up Telephone Call Date of Discharge: 10/20/22 Discharge Facility: [facility]) Type of Discharge: Inpatient Admission Primary Inpatient Discharge Diagnosis:: Unspecified ventral hernia with obstructions How have you been since you were released from the hospital?: Better Any questions or concerns?: No  Items Reviewed: Did you receive and understand the discharge instructions provided?: Yes Medications obtained,verified, and reconciled?: Yes (Medications Reviewed) Any new allergies since your discharge?: No Dietary orders reviewed?: Yes Type of Diet Ordered:: Diabetic Diet Do you have support at home?: Yes People in Home: friend(s) Name of Support/Comfort Primary Source: Mary  Medications Reviewed Today: Medications Reviewed Today     Reviewed by Redge Gainer on 10/22/22 at 1540  Med List Status: <None>   Medication Order Taking? Sig Documenting Provider Last Dose Status Informant  Alcohol Swabs (DROPSAFE ALCOHOL PREP) 70 % PADS [id number] Yes USE AS DIRECTED PRIOR TO MONITORING BLOOD GLUCOSE UP TO THREE TIMES DAILY Donita Brooks, MD Taking Active Self, Pharmacy Records  alendronate (FOSAMAX) 70 MG tablet [id number] Yes TAKE 1 TABLET EVERY 7 DAYS WITH A FULL GLASS OF WATER ON AN EMPTY STOMACH  Patient taking differently: Take 70 mg by mouth every Thursday.   Donita Brooks, MD Taking Active Self, Pharmacy Records  Ascorbic Acid (VITAMIN C PO) [id number] Yes Take 1 tablet by mouth daily. [provider] Taking Active Self, Pharmacy Records  aspirin EC 81 MG tablet [id number] Yes Take 81 mg by mouth daily. [provider] Taking Active Self, Pharmacy  Records  atorvastatin (LIPITOR) 40 MG tablet [id number] Yes TAKE 1 TABLET EVERY DAY Pickard, Priscille Heidelberg, MD Taking Active Self, Pharmacy Records  Blood Glucose Calibration (TRUE METRIX LEVEL 1) Low SOLN [id number] Yes Use as directed to monitor FSBS 1x daily. Dx: E11.9 Donita Brooks, MD Taking Active Self, Pharmacy Records  Blood Glucose Monitoring Suppl (TRUE METRIX METER) w/Device Andria Rhein [id number] Yes USE AS DIRECTED Donita Brooks, MD Taking Active Self, Pharmacy Records  Calcium Carb-Cholecalciferol (CALCIUM + VITAMIN D3 PO) [id number] Yes Take 1 tablet by mouth daily. [provider] Taking Active Self, Pharmacy Records  CINNAMON PO [id number] Yes Take 1 capsule by mouth daily. [provider] Taking Active Self, Pharmacy Records  docusate sodium (COLACE) 100 MG capsule [id number] Yes Take 1 capsule (100 mg total) by mouth 2 (two) times daily as needed for mild constipation. Miguel Rota, MD Taking Active   FLUoxetine (PROZAC) 20 MG capsule [id number] No Take 1 capsule (20 mg total) by mouth daily.  Patient not taking: Reported on 10/22/2022   Donita Brooks, MD Not Taking Active Self, Pharmacy Records  fluticasone [facility]) 50 MCG/ACT nasal spray [id number] Yes USE 2 SPRAYS IN EACH NOSTRIL EVERY DAY  Patient taking differently: Place 2 sprays into both nostrils daily as needed for allergies.   Donita Brooks, MD Taking Active Self, Pharmacy Records  glucose blood test strip [id number] Yes Use as instructed Donita Brooks, MD Taking Active Self, Pharmacy Records  ibuprofen (ADVIL) 600 MG tablet [id number] Yes Take 1 tablet (600 mg total) by mouth every 6 (six) hours as needed. Donovan Kail, PA-C Taking Active   losartan (COZAAR) 25 MG tablet [id number] Yes TAKE 1 TABLET EVERY DAY Donita Brooks, MD  Taking Active Self, Pharmacy Records  metFORMIN (GLUCOPHAGE) 1000 MG tablet [id number] Yes Courtesy refill. Pt need appt w/pcp for future refills.  TAKE 1 TABLET TWICE  DAILY WITH MEALS Pickard, Priscille Heidelberg, MD Taking Active Self, Pharmacy Records  Multiple Vitamins-Minerals (ZINC PO) [id number] Yes Take 1 tablet by mouth daily. [provider] Taking Active Self, Pharmacy Records  Omega-3 Fatty Acids (FISH OIL PO) [id number] Yes Take 2,000 mg by mouth daily. [provider] Taking Active Self, Pharmacy Records  ondansetron (ZOFRAN-ODT) 4 MG disintegrating tablet [id number] No Take 1 tablet (4 mg total) by mouth every 6 (six) hours as needed for nausea.  Patient not taking: Reported on 10/22/2022   Donovan Kail, PA-C Not Taking Active   oxybutynin (DITROPAN) 5 MG tablet [id number] Yes TAKE 1 TABLET FOUR TIMES DAILY  Patient taking differently: Take 10 mg by mouth 2 (two) times daily.   Donita Brooks, MD Taking Active Self, Pharmacy Records  oxyCODONE (OXY IR/ROXICODONE) 5 MG immediate release tablet [id number] Yes Take 1 tablet (5 mg total) by mouth every 6 (six) hours as needed for severe pain or breakthrough pain. Donovan Kail, PA-C Taking Active   pantoprazole (PROTONIX) 40 MG tablet [id number] Yes TAKE 1 TABLET TWICE DAILY Donita Brooks, MD Taking Active Self, Pharmacy Records  pioglitazone (ACTOS) 30 MG tablet [id number] Yes TAKE 1 TABLET EVERY DAY Donita Brooks, MD Taking Active Self, Pharmacy Records  polyethylene glycol (MIRALAX / GLYCOLAX) 17 g packet [id number] Yes Take 17 g by mouth daily as needed for moderate constipation or severe constipation. Miguel Rota, MD Taking Active   triamcinolone cream (KENALOG) 0.1 % [id number] Yes Apply topically 2 (two) times daily.  Patient taking differently: Apply 1 Application topically 2 (two) times daily as needed (skin irritation, itching).   Donita Brooks, MD Taking Active Self, Pharmacy Records  TRUEplus Lancets 33G MISC [id number] Yes Use as directed to monitor FSBS 1x daily. Dx: E11.9 Donita Brooks, MD Taking Active Self, Pharmacy Records            Home Care and  Equipment/Supplies: Were Home Health Services Ordered?: Yes Name of Home Health Agency:: [facility] Has Agency set up a time to come to your home?: No EMR reviewed for Home Health Orders: Orders present/patient has not received call (refer to CM for follow-up) (The patient has had her phone off and does not know if they have called) Any new equipment or medical supplies ordered?: Yes Name of Medical supply agency?: [facility] Were you able to get the equipment/medical supplies?: Yes Do you have any questions related to the use of the equipment/supplies?: No  Functional Questionnaire: Do you need assistance with bathing/showering or dressing?: No Do you need assistance with meal preparation?: No Do you need assistance with eating?: No Do you have difficulty maintaining continence: No (One accident when she first got home) Do you need assistance with getting out of bed/getting out of a chair/moving?: No Do you have difficulty managing or taking your medications?: No  Follow up appointments reviewed: PCP Follow-up appointment confirmed?: No (The patient states she will call and set it up. CRN to follow up next week) MD Provider Line Number:[phone] Given: No Specialist Hospital Follow-up appointment confirmed?: Yes Date of Specialist follow-up appointment?: 11/05/22 Follow-Up Specialty Provider:: Willeen Niece Do you need transportation to your follow-up appointment?: No Do you understand care options if your condition(s) worsen?: Yes-patient verbalized understanding  SDOH Interventions Today    Flowsheet Row  Most Recent Value  SDOH Interventions   Food Insecurity Interventions Intervention Not Indicated       Goals Addressed             This Visit's Progress    TOC Care Plan       Current Barriers:  Chronic Disease Management support and education needs related to DMII   RNCM Clinical Goal(s):  Patient will work with the Care Management team over the next 30 days  to address Transition of Care Barriers: Diet/Nutrition/Food Resources Provider appointments Home Health services through collaboration with RN Care manager, provider, and care team.   Interventions: Evaluation of current treatment plan related to  self management and patient's adherence to plan as established by provider   Diabetes Interventions:   Short Term Goal Assessed patient's understanding of A1c goal: <8% Reviewed medications with patient and discussed importance of medication adherence Lab Results  Component Value Date   HGBA1C 5.2 10/15/2022    Patient Goals/Self-Care Activities: Participate in Transition of Care Program/Attend [facility] scheduled calls Notify RN Care Manager of [facility] call rescheduling needs Take all medications as prescribed  Follow Up Plan:  Telephone follow up appointment with care management team member scheduled for:  10/29/2022        Deidre Ala, RN RN Care Manager VBCI-Population Health [phone]

## 2022-10-23 ENCOUNTER — Telehealth: Payer: Self-pay

## 2022-10-23 NOTE — Telephone Encounter (Signed)
Mel PT with [facility] called to get VO for pt as follows:  HH PT for: 2xW for 3wks 1xW for 3wks  Please call on secure line at [phone]

## 2022-10-27 ENCOUNTER — Other Ambulatory Visit: Payer: Self-pay | Admitting: Family Medicine

## 2022-10-27 ENCOUNTER — Other Ambulatory Visit: Payer: Self-pay

## 2022-10-27 ENCOUNTER — Telehealth: Payer: Self-pay

## 2022-10-27 DIAGNOSIS — F32A Depression, unspecified: Secondary | ICD-10-CM

## 2022-10-27 DIAGNOSIS — E7800 Pure hypercholesterolemia, unspecified: Secondary | ICD-10-CM

## 2022-10-27 DIAGNOSIS — N3281 Overactive bladder: Secondary | ICD-10-CM

## 2022-10-27 DIAGNOSIS — K219 Gastro-esophageal reflux disease without esophagitis: Secondary | ICD-10-CM

## 2022-10-27 DIAGNOSIS — E118 Type 2 diabetes mellitus with unspecified complications: Secondary | ICD-10-CM

## 2022-10-27 DIAGNOSIS — E119 Type 2 diabetes mellitus without complications: Secondary | ICD-10-CM

## 2022-10-27 DIAGNOSIS — F419 Anxiety disorder, unspecified: Secondary | ICD-10-CM

## 2022-10-27 MED ORDER — FLUOXETINE HCL 20 MG PO CAPS
20.0000 mg | ORAL_CAPSULE | Freq: Every day | ORAL | 1 refills | Status: DC
Start: 2022-10-27 — End: 2023-09-09

## 2022-10-27 MED ORDER — ATORVASTATIN CALCIUM 40 MG PO TABS: 40.0000 mg | ORAL_TABLET | Freq: Every day | ORAL | 1 refills | Status: DC

## 2022-10-27 MED ORDER — PANTOPRAZOLE SODIUM 40 MG PO TBEC
40.0000 mg | DELAYED_RELEASE_TABLET | Freq: Two times a day (BID) | ORAL | 1 refills | Status: DC
Start: 2022-10-27 — End: 2023-06-16

## 2022-10-27 MED ORDER — PIOGLITAZONE HCL 30 MG PO TABS: 30.0000 mg | ORAL_TABLET | Freq: Every day | ORAL | 1 refills | Status: DC

## 2022-10-27 MED ORDER — LOSARTAN POTASSIUM 25 MG PO TABS: ORAL_TABLET | ORAL | 1 refills | Status: AC

## 2022-10-27 MED ORDER — OXYCODONE HCL 5 MG PO TABS: 5.0000 mg | ORAL_TABLET | Freq: Four times a day (QID) | ORAL | 0 refills | Status: DC | PRN

## 2022-10-27 MED ORDER — OXYBUTYNIN CHLORIDE 5 MG PO TABS: 10.0000 mg | ORAL_TABLET | Freq: Two times a day (BID) | ORAL | 1 refills | Status: DC

## 2022-10-27 NOTE — Telephone Encounter (Signed)
Pt called in wanting to speak with nurse/pcp about getting refills of all meds. Pt stated that she recently had 2 surgeries and has sinced moved. Her meds have been placed in storage, and pt is unable to get to them. Pt states that she spoke with [facility] and was informed in order to get refills she would need to speak with provider to get them re-sent. Pt would also like to know if pcp would refill a pain med oxyCODONE (OXY IR/ROXICODONE) 5 MG immediate release that was prescribed while in hospital to pharmacy. Pt would like pain med to be sent to [facility] pharmacy in [place]. Please advise.  Cb#: [phone]

## 2022-10-28 ENCOUNTER — Telehealth: Payer: Self-pay

## 2022-10-28 ENCOUNTER — Other Ambulatory Visit: Payer: Self-pay | Admitting: Family Medicine

## 2022-10-28 DIAGNOSIS — E119 Type 2 diabetes mellitus without complications: Secondary | ICD-10-CM

## 2022-10-28 MED ORDER — HYDROCODONE-ACETAMINOPHEN 5-325 MG PO TABS: 1.0000 | ORAL_TABLET | Freq: Four times a day (QID) | ORAL | 0 refills | Status: DC | PRN

## 2022-10-28 MED ORDER — METFORMIN HCL 1000 MG PO TABS: ORAL_TABLET | ORAL | 0 refills | Status: DC

## 2022-10-28 NOTE — Telephone Encounter (Signed)
Requested Prescriptions  Pending Prescriptions Disp Refills   metFORMIN (GLUCOPHAGE) 1000 MG tablet 60 tablet 0    Sig: Courtesy refill. Pt need appt w/pcp for future refills.  TAKE 1 TABLET TWICE DAILY WITH MEALS     Endocrinology:  Diabetes - Biguanides Failed - 10/28/2022  3:52 PM      Failed - B12 Level in normal range and within 720 days    No results found for: "VITAMINB12"       Failed - Valid encounter within last 6 months    Recent Outpatient Visits           2 years ago OAB (overactive bladder)   [facility] Donita Brooks, MD   2 years ago Diabetes mellitus without complication (HCC)   [facility] Donita Brooks, MD   2 years ago Foot pain, right   [facility] Pickard, Priscille Heidelberg, MD   2 years ago Diabetes mellitus without complication (HCC)   [facility] Pickard, Priscille Heidelberg, MD   3 years ago Pure hypercholesterolemia   [facility] Pickard, Priscille Heidelberg, MD       Future Appointments             In 6 days Pickard, Priscille Heidelberg, MD [facility]            Failed - CBC within normal limits and completed in the last 12 months    WBC  Date Value Ref Range Status  10/17/2022 7.5 4.0 - 10.5 K/uL Final   RBC  Date Value Ref Range Status  10/17/2022 3.29 (L) 3.87 - 5.11 MIL/uL Final   Hemoglobin  Date Value Ref Range Status  10/17/2022 9.6 (L) 12.0 - 15.0 g/dL Final   HCT  Date Value Ref Range Status  10/17/2022 31.0 (L) 36.0 - 46.0 % Final   MCHC  Date Value Ref Range Status  10/17/2022 31.0 30.0 - 36.0 g/dL Final   [facility]  Date Value Ref Range Status  10/17/2022 29.2 26.0 - 34.0 pg Final   MCV  Date Value Ref Range Status  10/17/2022 94.2 80.0 - 100.0 fL Final   No results found for: "PLTCOUNTKUC", "LABPLAT", "POCPLA" RDW  Date Value Ref Range Status  10/17/2022 15.4 11.5 - 15.5 % Final         Passed - Cr in normal range and within  360 days    Creat  Date Value Ref Range Status  08/04/2022 1.07 (H) 0.60 - 1.00 mg/dL Final   Creatinine, Ser  Date Value Ref Range Status  10/17/2022 0.68 0.44 - 1.00 mg/dL Final   Creatinine, Urine  Date Value Ref Range Status  08/04/2022 41 20 - 275 mg/dL Final         Passed - HBA1C is between 0 and 7.9 and within 180 days    Hgb A1c MFr Bld  Date Value Ref Range Status  10/15/2022 5.2 4.8 - 5.6 % Final    Comment:    (NOTE) Pre diabetes:          5.7%-6.4%  Diabetes:              >6.4%  Glycemic control for   <7.0% adults with diabetes          Passed - eGFR in normal range and within 360 days    GFR, Est African American  Date Value Ref Range Status  05/13/2020 64 >  OR = 60 mL/min/1.46m2 Final   GFR, Est Non African American  Date Value Ref Range Status  05/13/2020 55 (L) > OR = 60 mL/min/1.8m2 Final   GFR, Estimated  Date Value Ref Range Status  10/17/2022 >60 >60 mL/min Final    Comment:    (NOTE) Calculated using the CKD-EPI Creatinine Equation (2021)    GFR  Date Value Ref Range Status  04/27/2011 83.88 >60.00 mL/min Final   eGFR  Date Value Ref Range Status  08/04/2022 56 (L) > OR = 60 mL/min/1.66m2 Final

## 2022-10-28 NOTE — Telephone Encounter (Signed)
Prescription Request  10/28/2022  LOV: 08/04/2022  What is the name of the medication or equipment?   metFORMIN (GLUCOPHAGE) 1000 MG tablet  **new script requested**  Have you contacted your pharmacy to request a refill? Yes   Which pharmacy would you like this sent to?  [facility] - [place], [place] - [address] [place] [address] Phone: [phone] Fax: [phone]    Patient notified that their request is being sent to the clinical staff for review and that they should receive a response within 2 business days.   Please advise pharmacist.

## 2022-10-28 NOTE — Telephone Encounter (Signed)
Pt called and asks if pain medication can be changed form Oxycodone to the Hydrocodone 5/325 that she was previously on. Pt states the Oxycodone does not work long enough for her pain.  Her last RF was 08/04/2022 of the Hydrocodone.  Please send to [facility] in [place]. Thank you.

## 2022-10-29 ENCOUNTER — Telehealth: Payer: Self-pay

## 2022-10-29 ENCOUNTER — Other Ambulatory Visit: Payer: Medicare PPO

## 2022-10-29 NOTE — Telephone Encounter (Addendum)
Telephone encounter was:  Successful.  10/29/2022 Name: LUCRESHA CARLYON MRN: [id number] DOB: 12-10-1950  LACHINA BRUN is a 72 y.o. year old female who is a primary care patient of Pickard, Priscille Heidelberg, MD . The community resource team was consulted for assistance with Transportation Needs   Care guide performed the following interventions: Spoke with patient about transportation needs. Patient stated she does not have anyone at home or anyone else to assist with transportation. Patient was busy and asked if I could call her tomorrow morning. I will make a 3-way call to [facility] to verify if patient has a transportation benefit. Verified home address updated new address [address] [place] [address].  Follow Up Plan:   I will call patient after 9am tomorrow as requested.  Troi Florendo [facility]  [facility], [facility] Guide Direct Dial: [phone]  Website: [email or web address]

## 2022-10-29 NOTE — Patient Outreach (Signed)
Care Management  Transitions of Care Program Transitions of Care Post-discharge week 2   10/29/2022 Name: EDELYNN HYDER MRN: [id number] DOB: [birth year]  Subjective: MALESHA BENVENUTO is a 72 y.o. year old female who is a primary care patient of Pickard, Priscille Heidelberg, MD. The Care Management team Engaged with patient Engaged with patient by telephone to assess and address transitions of care needs.   Consent to Services:  Patient was given information about care management services, agreed to services, and gave verbal consent to participate.   Assessment:  Date of Discharge: 10/20/22 Discharge Facility: [facility]) Type of Discharge: Inpatient Admission Primary Inpatient Discharge Diagnosis:: Hernia Repair  SDOH Interventions    Flowsheet Row Telephone from 10/29/2022 in [place] POPULATION HEALTH DEPARTMENT Telephone from 10/22/2022 in  POPULATION HEALTH DEPARTMENT Clinical Support from 03/06/2022 in [facility] Clinical Support from 02/28/2021 in [facility]  SDOH Interventions      Food Insecurity Interventions Intervention Not Indicated Intervention Not Indicated Intervention Not Indicated Intervention Not Indicated  Housing Interventions -- -- Intervention Not Indicated Intervention Not Indicated  Transportation Interventions AMB Referral -- Intervention Not Indicated Intervention Not Indicated  Utilities Interventions -- -- Intervention Not Indicated --  Alcohol Usage Interventions -- -- Intervention Not Indicated (Score <7) --  Financial Strain Interventions -- -- Intervention Not Indicated Intervention Not Indicated  Physical Activity Interventions -- -- Intervention Not Indicated Other (Comments)  [Encouraged pt to do chair exercises and stretching.]  Stress Interventions -- -- Intervention Not Indicated Intervention Not Indicated  [Due to daughter's health but states she is  handling is okay.]  Social Connections Interventions -- -- -- Other (Comment)  [Encouraged pt [facility] the [facility] [place].]        Goals Addressed             This Visit's Progress    Patient Stated       Patient Stated       Current Barriers:  Transportation barriers  RNCM Clinical Goal(s):  Patient will work with the Care Management team over the next 30 days to address Transition of Care Barriers: Medication Management Provider appointments Home Health services Transportation continue to work with RN Care Manager to address care management and care coordination needs related to  DMII and Pain as evidenced by adherence to CM Team Scheduled appointments through collaboration with RN Care manager, provider, and care team.   Interventions: Evaluation of current treatment plan related to  self management and patient's adherence to plan as established by provider   Diabetes Interventions:  (Status:  New goal.) Short Term Goal Assessed patient's understanding of A1c goal: <7% Reviewed medications with patient and discussed importance of medication adherence Discussed plans with patient for ongoing care management follow up and provided patient with direct contact information for care management team Lab Results  Component Value Date   HGBA1C 5.2 10/15/2022    Pain Interventions:  (Status:  New goal.) Short Term Goal Pain assessment performed Medications reviewed Reviewed provider established plan for pain management Discussed importance of adherence to all scheduled medical appointments Counseled on the importance of reporting any/all new or changed pain symptoms or management strategies to pain management provider  Patient Goals/Self-Care Activities: Participate in Transition of Care Program/Attend [facility] scheduled calls Notify RN Care Manager of [facility] call rescheduling needs Take all medications as prescribed Attend all scheduled provider  appointments  Follow Up Plan:  Telephone  follow up appointment with care management team member scheduled for:  October 18th 11:00am          Deidre Ala, RN RN Care Manager [facility] [phone]

## 2022-10-30 ENCOUNTER — Telehealth: Payer: Self-pay

## 2022-10-30 NOTE — Telephone Encounter (Addendum)
Telephone encounter was:  Successful.  10/30/2022 Name: Rhonda Fields MRN: [id number] DOB: November 02, 1950  Rhonda Fields is a 72 y.o. year old female who is a primary care patient of Pickard, Priscille Heidelberg, MD . The community resource team was consulted for assistance with Transportation Needs   Care guide performed the following interventions: Made a three way call to [facility] spoke with Britney who confirmed patient has transportation benefit and also updated patient's home address. Patient stated she did not know when her appointments were and I informed her of the dates and times in Epic.   She stated she would have to confirm the dates and times and would call [employer] back to schedule. She had to answer a long distance call and disconnected.  Britney did not get the opportunity to tell her she would need to call [employer] 3 business days in advance of  her appointments. Patient was given the number to call [phone]. Also informed patient that Marcelino Duster at [place] would be contacting her today regarding transportation for 11/05/22.  Patient stated she thought she might have missed the call, gave her the number to call MIchelle at [facility] [phone] she stated she would return the call and follow-up.  Spoke with Deidre Ala, RN  to update on referral status. Thayer Ohm stated it is ok to close the referral and she will follow-up with the patient.  Follow Up Plan:  No further follow up planned at this time. The patient has been provided with needed resources.  Cayman Kielbasa [facility]  [facility], [facility] Guide Direct Dial: [phone]  Website: [email or web address]

## 2022-10-30 NOTE — Telephone Encounter (Signed)
Telephone encounter was:  Unsuccessful.  10/30/2022 Name: Rhonda Fields MRN: [id number] DOB: 1950/03/14  Unsuccessful outbound call made today to assist with:  Transportation Needs   Outreach Attempt:  1st Attempt  A HIPAA compliant voice message was left requesting a return call.  Instructed patient to call back at [phone]. Left message on patient's voicemail to return my call regarding transportation assistance at her earliest convenience.  Aero Drummonds [facility]  [facility], [facility] Guide Direct Dial: [phone]  Website: [email or web address]

## 2022-10-30 NOTE — Telephone Encounter (Signed)
Telephone encounter was:  Successful.  10/30/2022 Name: Rhonda Fields MRN: [id number] DOB: [birth year]  [name] is a 72 y.o. year old female who is a primary care patient of Tanya Nones, Priscille Heidelberg, MD . The community resource team was consulted for assistance with Transportation Needs   Care guide performed the following interventions: Spoke with Marcelino Duster at [place] to arrange transportation for 11/05/22 1:30pm appointment at [facility]. Marcelino Duster stated she will send [name] the Nature conservation officer to the patient's house to determine if the Zenaida Niece can get in and out of the driveway . [name] will call the patient today to set up a time and take the application with him to be filled out today.   Follow Up Plan:   [facility] will reach out to the patient to schedule home visit today.  Lorice Lafave [facility]  [facility], [facility] Guide Direct Dial: [phone]  Website: [email or web address]

## 2022-11-03 ENCOUNTER — Telehealth: Payer: Self-pay

## 2022-11-03 ENCOUNTER — Encounter: Payer: Self-pay | Admitting: Family Medicine

## 2022-11-03 ENCOUNTER — Ambulatory Visit (INDEPENDENT_AMBULATORY_CARE_PROVIDER_SITE_OTHER): Payer: Medicare PPO | Admitting: Family Medicine

## 2022-11-03 ENCOUNTER — Other Ambulatory Visit: Payer: Self-pay

## 2022-11-03 VITALS — BP 128/76 | HR 69 | Temp 98.5°F | Ht 62.0 in | Wt 147.6 lb

## 2022-11-03 DIAGNOSIS — E7800 Pure hypercholesterolemia, unspecified: Secondary | ICD-10-CM | POA: Diagnosis not present

## 2022-11-03 DIAGNOSIS — Z7984 Long term (current) use of oral hypoglycemic drugs: Secondary | ICD-10-CM

## 2022-11-03 DIAGNOSIS — Z0001 Encounter for general adult medical examination with abnormal findings: Secondary | ICD-10-CM

## 2022-11-03 DIAGNOSIS — M7989 Other specified soft tissue disorders: Secondary | ICD-10-CM

## 2022-11-03 DIAGNOSIS — Z0000 Encounter for general adult medical examination without abnormal findings: Secondary | ICD-10-CM

## 2022-11-03 DIAGNOSIS — E119 Type 2 diabetes mellitus without complications: Secondary | ICD-10-CM

## 2022-11-03 MED ORDER — FUROSEMIDE 40 MG PO TABS: 40.0000 mg | ORAL_TABLET | Freq: Every day | ORAL | 0 refills | Status: DC | PRN

## 2022-11-03 MED ORDER — METFORMIN HCL 1000 MG PO TABS: 1000.0000 mg | ORAL_TABLET | Freq: Two times a day (BID) | ORAL | 1 refills | Status: DC

## 2022-11-03 NOTE — Telephone Encounter (Signed)
Prescription Request  11/03/2022  LOV: 11/03/22  What is the name of the medication or equipment? metFORMIN (GLUCOPHAGE) 1000 MG tablet [[id number]]   Have you contacted your pharmacy to request a refill? Yes   Which pharmacy would you like this sent to?  [facility] - [place], [place] - [address] [place] [address] Phone: [phone] Fax: [phone]    Patient notified that their request is being sent to the clinical staff for review and that they should receive a response within 2 business days.   Please advise at [facility] [phone]

## 2022-11-03 NOTE — Progress Notes (Signed)
Subjective:    Patient ID: Rhonda Fields, female    DOB: 1950-09-22, 72 y.o.   MRN: [id number]  Recently admitted: Admit date: 10/15/2022 Discharge date: 10/20/2022   Discharge Diagnoses     Patient Active Problem List    Diagnosis Date Noted  . Incarcerated ventral hernia 10/15/2022      Consultants None   Procedures 10/15/2022:Incarcerated ventral Hernia Repair x2 (total 5 cm)   HPI: 72 y.o. female presented to [facility] ED today for nausea/emesis and concern over care at [facility]. Patient reports she has had significant nausea, foul smelling emesis, and intermittent abdominal pain since Tuesday of this week. She was getting "something under the tongue" at [facility] without improvement. Abdominal pain primarily located near umbilicus. She denied any associated fever, chills, CP, SOB. She is passing a small amount of flatus. No BM since Tuesday. She has a known history of this umbilical hernia for a while now. She has been following with her PCP for this but it has always previously been reducible. Of note, she is currently at [facility] secondary to a right hip fracture surgically repaired at [facility] on 09/04. She was discharged to [facility] on 09/09 with 14 days of Lovenox. Otherwise, she is not on any anticoagulation. Previous intra-abdominal surgeries positive for open cholecystectomy and hysterectomy. Work up in the ED revealed a leukocytosis to 12.0K, Hgb to 11.0, sCr - 0.84, hyponatremia to 132. Venous lactate is pending. CT Abdomen/Pelvis is concerning for umbilical hernia with associated small bowel obstruction. She has also been tachycardic in the ED.    Hospital Course: Informed consent was obtained and documented, and patient underwent uneventful ventral hernia repair (total 5 cm) (Dr Maurine Minister, 10/15/2022).  Post-operatively, patient did well. NGT removed on POD1 after passing clamping trial. Advancement of patient's diet and ambulation were well-tolerated. The remainder of patient's hospital course was  essentially unremarkable, and discharge planning was initiated accordingly with patient safely able to be discharged home with appropriate discharge instructions, pain control, and outpatient follow-up after all of her questions were answered to her expressed satisfaction.   11/03/22 Patient is here today for physical exam.  Since being discharged from the hospital she reports constipation.  She states that her stools are hard.  She is going 2 or 3 days without having a bowel movement.  She denies any blood in the stool.  She denies any abdominal pain.  She denies any nausea or vomiting.  She is due for a flu shot.  She is due for shingles vaccine.  She declines both of these today.  She is due for mammogram.  She is also overdue for colonoscopy.  I explained this to her.  The patient does not want to schedule these tests right now.  She does have +1 pitting edema in both legs to the knees.  She denies any chest pain or shortness of breath or dyspnea on exertion or pleurisy.   Past Medical History:  Diagnosis Date  . Anxiety   . Arrhythmia   . Chronic headaches   . Conjunctivitis 10/31/2012  . Depression   . Diabetes mellitus without complication (HCC)   . Generalized abdominal pain 04/29/2011  . Generalized headaches 04/08/2011  . GERD (gastroesophageal reflux disease)   . Hard of hearing   . IBS (irritable bowel syndrome) 01/20/1984   Past Surgical History:  Procedure Laterality Date  . ABDOMINAL HYSTERECTOMY    . CHOLECYSTECTOMY    . ERCP  04/20/2011   Procedure: ENDOSCOPIC RETROGRADE CHOLANGIOPANCREATOGRAPHY (ERCP);  Surgeon: Louis Meckel, MD;  Location: [facility];  Service: Endoscopy;  Laterality: N/A;  case is at 1430 in or  . ERCP  04/20/2011   Procedure: ENDOSCOPIC RETROGRADE CHOLANGIOPANCREATOGRAPHY (ERCP);  Surgeon: Louis Meckel, MD;  Location: [facility];  Service: Gastroenterology;  Laterality: N/A;  . INTRAMEDULLARY (IM) NAIL INTERTROCHANTERIC Right 09/23/2022   Procedure:  INTRAMEDULLARY (IM) NAIL INTERTROCHANTERIC;  Surgeon: Tarry Kos, MD;  Location: [facility] OR;  Service: Orthopedics;  Laterality: Right;  . VENTRAL HERNIA REPAIR N/A 10/15/2022   Procedure: HERNIA REPAIR VENTRAL ADULT;  Surgeon: Kandis Cocking, MD;  Location: [facility] ORS;  Service: General;  Laterality: N/A;   Current Outpatient Medications on File Prior to Visit  Medication Sig Dispense Refill  . Alcohol Swabs (DROPSAFE ALCOHOL PREP) 70 % PADS USE AS DIRECTED PRIOR TO MONITORING BLOOD GLUCOSE UP TO THREE TIMES DAILY 300 each 3  . alendronate (FOSAMAX) 70 MG tablet TAKE 1 TABLET EVERY 7 DAYS WITH A FULL GLASS OF WATER ON AN EMPTY STOMACH (Patient taking differently: Take 70 mg by mouth every Thursday.) 12 tablet 3  . Ascorbic Acid (VITAMIN C PO) Take 1 tablet by mouth daily.    Marland Kitchen aspirin EC 81 MG tablet Take 81 mg by mouth daily.    Marland Kitchen atorvastatin (LIPITOR) 40 MG tablet Take 1 tablet (40 mg total) by mouth daily. 90 tablet 1  . Blood Glucose Calibration (TRUE METRIX LEVEL 1) Low SOLN Use as directed to monitor FSBS 1x daily. Dx: E11.9 100 each 3  . Blood Glucose Monitoring Suppl (TRUE METRIX METER) w/Device KIT USE AS DIRECTED 1 kit 3  . Calcium Carb-Cholecalciferol (CALCIUM + VITAMIN D3 PO) Take 1 tablet by mouth daily.    Marland Kitchen CINNAMON PO Take 1 capsule by mouth daily.    Marland Kitchen docusate sodium (COLACE) 100 MG capsule Take 1 capsule (100 mg total) by mouth 2 (two) times daily as needed for mild constipation.    Marland Kitchen FLUoxetine (PROZAC) 20 MG capsule Take 1 capsule (20 mg total) by mouth daily. 90 capsule 1  . fluticasone (FLONASE) 50 MCG/ACT nasal spray USE 2 SPRAYS IN EACH NOSTRIL EVERY DAY (Patient taking differently: Place 2 sprays into both nostrils daily as needed for allergies.) 48 g 1  . glucose blood test strip Use as instructed 100 each 3  . HYDROcodone-acetaminophen (NORCO) 5-325 MG tablet Take 1 tablet by mouth every 6 (six) hours as needed for moderate pain. 30 tablet 0  . ibuprofen (ADVIL) 600 MG  tablet Take 1 tablet (600 mg total) by mouth every 6 (six) hours as needed. 30 tablet 0  . losartan (COZAAR) 25 MG tablet TAKE 1 TABLET EVERY DAY 90 tablet 1  . metFORMIN (GLUCOPHAGE) 1000 MG tablet Courtesy refill. Pt need appt w/pcp for future refills.  TAKE 1 TABLET TWICE DAILY WITH MEALS 60 tablet 0  . Multiple Vitamins-Minerals (ZINC PO) Take 1 tablet by mouth daily.    . Omega-3 Fatty Acids (FISH OIL PO) Take 2,000 mg by mouth daily.    . ondansetron (ZOFRAN-ODT) 4 MG disintegrating tablet Take 1 tablet (4 mg total) by mouth every 6 (six) hours as needed for nausea. 20 tablet 0  . oxybutynin (DITROPAN) 5 MG tablet Take 2 tablets (10 mg total) by mouth 2 (two) times daily. 180 tablet 1  . pantoprazole (PROTONIX) 40 MG tablet Take 1 tablet (40 mg total) by mouth 2 (two) times daily. 180 tablet 1  . pioglitazone (ACTOS) 30 MG tablet Take 1  tablet (30 mg total) by mouth daily. 90 tablet 1  . polyethylene glycol (MIRALAX / GLYCOLAX) 17 g packet Take 17 g by mouth daily as needed for moderate constipation or severe constipation.    . triamcinolone cream (KENALOG) 0.1 % Apply topically 2 (two) times daily. (Patient taking differently: Apply 1 Application topically 2 (two) times daily as needed (skin irritation, itching).) 30 g 3  . TRUEplus Lancets 33G MISC Use as directed to monitor FSBS 1x daily. Dx: E11.9 100 each 3  . oxyCODONE (OXY IR/ROXICODONE) 5 MG immediate release tablet Take 5 mg by mouth 4 (four) times daily as needed. (Patient not taking: Reported on 11/03/2022)     No current facility-administered medications on file prior to visit.   Allergies  Allergen Reactions  . Ivp Dye [Iodinated Contrast Media] Itching and Nausea And Vomiting  . Mobic [Meloxicam] Other (See Comments)    Lower extremity swelling  . Penicillins Hives and Itching   Social History   Socioeconomic History  . Marital status: Widowed    Spouse name: Not on file  . Number of children: 4  . Years of  education: Not on file  . Highest education level: Not on file  Occupational History  . Occupation: Unemployed  Tobacco Use  . Smoking status: Every Day    Current packs/day: 1.00    Average packs/day: 1 pack/day for 40.0 years (40.0 ttl pk-yrs)    Types: Cigarettes  . Smokeless tobacco: Never  Substance and Sexual Activity  . Alcohol use: No  . Drug use: No  . Sexual activity: Not on file  Other Topics Concern  . Not on file  Social History Narrative   Daughter lives next door.   Widowed. Was married x 37 years.   Social Determinants of Health   Financial Resource Strain: Low Risk  (03/06/2022)   Overall Financial Resource Strain (CARDIA)   . Difficulty of Paying Living Expenses: Not very hard  Food Insecurity: No Food Insecurity (10/29/2022)   Hunger Vital Sign   . Worried About Programme researcher, broadcasting/film/video in the Last Year: Never true   . Ran Out of Food in the Last Year: Never true  Transportation Needs: No Transportation Needs (10/30/2022)   PRAPARE - Transportation   . Lack of Transportation (Medical): No   . Lack of Transportation (Non-Medical): No  Physical Activity: Insufficiently Active (03/06/2022)   Exercise Vital Sign   . Days of Exercise per Week: 2 days   . Minutes of Exercise per Session: 10 min  Stress: No Stress Concern Present (03/06/2022)   [employer] of Occupational Health - Occupational Stress Questionnaire   . Feeling of Stress : Only a little  Social Connections: Socially Isolated (03/06/2022)   Social Connection and Isolation Panel [NHANES]   . Frequency of Communication with Friends and Family: More than three times a week   . Frequency of Social Gatherings with Friends and Family: More than three times a week   . Attends Religious Services: Never   . Active Member of Clubs or Organizations: No   . Attends Banker Meetings: Never   . Marital Status: Widowed  Intimate Partner Violence: Not At Risk (10/15/2022)   Humiliation, Afraid,  Rape, and Kick questionnaire   . Fear of Current or Ex-Partner: No   . Emotionally Abused: No   . Physically Abused: No   . Sexually Abused: No     Review of Systems  All other systems reviewed and are negative.  Objective:   Physical Exam Vitals reviewed.  Constitutional:      Appearance: Normal appearance. She is normal weight.  Cardiovascular:     Rate and Rhythm: Normal rate and regular rhythm.     Heart sounds: Normal heart sounds.  Pulmonary:     Effort: Pulmonary effort is normal. No respiratory distress.     Breath sounds: Normal breath sounds. No wheezing or rales.  Chest:     Chest wall: No tenderness.  Abdominal:     General: Bowel sounds are normal. There is no distension.     Palpations: Abdomen is soft.     Tenderness: There is no abdominal tenderness. There is no guarding or rebound.     Hernia: No hernia is present.  Musculoskeletal:     Right lower leg: Edema present.     Left lower leg: Edema present.  Neurological:     Mental Status: She is alert.          Assessment & Plan:  Pure hypercholesterolemia - Plan: CBC with Differential/Platelet, COMPLETE METABOLIC PANEL WITH GFR, Lipid panel  Diabetes mellitus without complication (HCC)  General medical exam  Leg swelling For the leg swelling, I recommended Lasix 40 mg daily as needed.  I anticipate 3 to 4 days she will be able to stop this.  The swelling is bilateral so I do not suspect a blood clot.  Her hemoglobin A1c on September 24 was outstanding at less than 6.  Therefore she can discontinue pioglitazone.  I want her to add MiraLAX twice daily for constipation.  If this is not helpful she can add Dulcolax as a stimulant laxative.  Recommended a flu shot.  Recommended Shingrix recommended a mammogram.  Recommended colonoscopy.  She declines these today.  I will repeat a CBC and a CMP along with a lipid panel.

## 2022-11-04 ENCOUNTER — Other Ambulatory Visit (INDEPENDENT_AMBULATORY_CARE_PROVIDER_SITE_OTHER): Payer: Medicare PPO

## 2022-11-04 ENCOUNTER — Ambulatory Visit: Payer: Medicare PPO | Admitting: Physician Assistant

## 2022-11-04 ENCOUNTER — Encounter: Payer: Self-pay | Admitting: Orthopaedic Surgery

## 2022-11-04 ENCOUNTER — Telehealth: Payer: Self-pay

## 2022-11-04 DIAGNOSIS — S72141A Displaced intertrochanteric fracture of right femur, initial encounter for closed fracture: Secondary | ICD-10-CM

## 2022-11-04 DIAGNOSIS — M25551 Pain in right hip: Secondary | ICD-10-CM | POA: Diagnosis not present

## 2022-11-04 LAB — LIPID PANEL
Cholesterol: 120 mg/dL (ref <200)
HDL: 50 mg/dL (ref >=50)
LDL Cholesterol (Calc): 47 mg/dL
Non-HDL Cholesterol (Calc): 70 mg/dL (ref <130)
Total CHOL/HDL Ratio: 2.4 (calc) (ref <5.0)
Triglycerides: 156 mg/dL — ABNORMAL HIGH (ref <150)

## 2022-11-04 LAB — CBC WITH DIFFERENTIAL/PLATELET
Absolute Lymphocytes: 1299 {cells}/uL (ref 850–3900)
Absolute Monocytes: 338 {cells}/uL (ref 200–950)
Basophils Absolute: 53 {cells}/uL (ref 0–200)
Basophils Relative: 0.6 %
Eosinophils Absolute: 178 {cells}/uL (ref 15–500)
Eosinophils Relative: 2 %
HCT: 32 % — ABNORMAL LOW (ref 35.0–45.0)
Hemoglobin: 9.8 g/dL — ABNORMAL LOW (ref 11.7–15.5)
MCH: 29.2 pg (ref 27.0–33.0)
MCHC: 30.6 g/dL — ABNORMAL LOW (ref 32.0–36.0)
MCV: 95.2 fL (ref 80.0–100.0)
MPV: 8.4 fL (ref 7.5–12.5)
Monocytes Relative: 3.8 %
Neutro Abs: 7031 {cells}/uL (ref 1500–7800)
Neutrophils Relative %: 79 %
Platelets: 485 10*3/uL — ABNORMAL HIGH (ref 140–400)
RBC: 3.36 10*6/uL — ABNORMAL LOW (ref 3.80–5.10)
RDW: 14 % (ref 11.0–15.0)
Total Lymphocyte: 14.6 %
WBC: 8.9 10*3/uL (ref 3.8–10.8)

## 2022-11-04 LAB — COMPLETE METABOLIC PANEL WITH GFR
AG Ratio: 1.1 (calc) (ref 1.0–2.5)
ALT: 15 U/L (ref 6–29)
AST: 17 U/L (ref 10–35)
Albumin: 3.1 g/dL — ABNORMAL LOW (ref 3.6–5.1)
Alkaline phosphatase (APISO): 145 U/L (ref 37–153)
BUN: 9 mg/dL (ref 7–25)
CO2: 20 mmol/L (ref 20–32)
Calcium: 8.9 mg/dL (ref 8.6–10.4)
Chloride: 108 mmol/L (ref 98–110)
Creat: 0.98 mg/dL (ref 0.60–1.00)
Globulin: 2.8 g/dL (ref 1.9–3.7)
Glucose, Bld: 138 mg/dL — ABNORMAL HIGH (ref 65–99)
Potassium: 5 mmol/L (ref 3.5–5.3)
Sodium: 139 mmol/L (ref 135–146)
Total Bilirubin: 0.2 mg/dL (ref 0.2–1.2)
Total Protein: 5.9 g/dL — ABNORMAL LOW (ref 6.1–8.1)
eGFR: 62 mL/min/{1.73_m2} (ref >=60)

## 2022-11-04 NOTE — Telephone Encounter (Signed)
Called patient. No answer. LMOM that [facility] is going to be reaching out to schedule HHPT.

## 2022-11-04 NOTE — Addendum Note (Signed)
Addended by: Wendi Maya on: 11/04/2022 10:53 AM   Modules accepted: Orders

## 2022-11-04 NOTE — Progress Notes (Signed)
Post-Op Visit Note   Patient: Rhonda Fields           Date of Birth: Apr 03, 1950           MRN: [id number] Visit Date: 11/04/2022 PCP: Donita Brooks, MD   Assessment & Plan:  Chief Complaint:  Chief Complaint  Patient presents with   Right Hip - Follow-up    IM intertroch nailing 09/23/2022   Visit Diagnoses:  1. Pain in right hip   2. Displaced intertrochanteric fracture of right femur, initial encounter for closed fracture [facility])     Plan: Patient is a pleasant 72 year old female who comes in today approximately 6 weeks status post right hip IM nail from intertrochanteric and midshaft femur fractures close doing surgery on 09/23/2022.  She was still having some discomfort and is taking hydrocodone.  She is ambulating with a walker and does not appear to be 25% weightbearing.  She has not had any home health physical therapy.  She does tell me she was hospitalized for small bowel obstruction a few weeks after surgery.  Examination of her right hip reveals painless hip flexion although this is somewhat limited.  She is neurovascularly intact distally.  At this point, we will look into home health physical therapy as we will allow her to officially begin weightbearing as tolerated.  She will follow-up with [place] in 6 weeks for repeat evaluation and x-rays of the right femur.  Call with concerns or questions.  Follow-Up Instructions: Return in about 6 weeks (around 12/16/2022).   Orders:  Orders Placed This Encounter  Procedures   XR FEMUR, MIN 2 VIEWS RIGHT   No orders of the defined types were placed in this encounter.   Imaging: XR FEMUR, MIN 2 VIEWS RIGHT  Result Date: 11/04/2022 X-rays demonstrate stable alignment of the intertrochanteric and midshaft femur fractures.  No hardware complication.   PMFS History: Patient Active Problem List   Diagnosis Date Noted   Incarcerated ventral hernia 10/15/2022   Hip fracture (HCC) 09/23/2022   Displaced spiral fracture of  shaft of right femur, initial encounter for closed fracture (HCC) 09/23/2022   Displaced intertrochanteric fracture of right femur, initial encounter for closed fracture (HCC) 09/22/2022   AKI (acute kidney injury) (HCC) 09/22/2022   Leukocytosis 09/22/2022   GERD (gastroesophageal reflux disease) 09/22/2022   Depression 09/22/2022   Anxiety 09/22/2022   HTN (hypertension) 09/22/2022   HLD (hyperlipidemia) 09/22/2022   DM2 (diabetes mellitus, type 2) (HCC) 01/30/2013   History of total hysterectomy with removal of both tubes and ovaries 04/08/2011   Past Medical History:  Diagnosis Date   Anxiety    Arrhythmia    Chronic headaches    Conjunctivitis 10/31/2012   Depression    Diabetes mellitus without complication (HCC)    Generalized abdominal pain 04/29/2011   Generalized headaches 04/08/2011   GERD (gastroesophageal reflux disease)    Hard of hearing    IBS (irritable bowel syndrome) 01/20/1984    Family History  Adopted: Yes  Problem Relation Age of Onset   Diabetes Other        Siblings, both sets of grandparents   Liver disease Mother    Kidney disease Mother    Liver disease Brother    Malignant hyperthermia Neg Hx     Past Surgical History:  Procedure Laterality Date   ABDOMINAL HYSTERECTOMY     CHOLECYSTECTOMY     ERCP  04/20/2011   Procedure: ENDOSCOPIC RETROGRADE CHOLANGIOPANCREATOGRAPHY (ERCP);  Surgeon: Barbette Hair  Arlyce Dice, MD;  Location: [facility];  Service: Endoscopy;  Laterality: N/A;  case is at 1430 in or   ERCP  04/20/2011   Procedure: ENDOSCOPIC RETROGRADE CHOLANGIOPANCREATOGRAPHY (ERCP);  Surgeon: Louis Meckel, MD;  Location: [facility];  Service: Gastroenterology;  Laterality: N/A;   INTRAMEDULLARY (IM) NAIL INTERTROCHANTERIC Right 09/23/2022   Procedure: INTRAMEDULLARY (IM) NAIL INTERTROCHANTERIC;  Surgeon: Tarry Kos, MD;  Location: [facility];  Service: Orthopedics;  Laterality: Right;   VENTRAL HERNIA REPAIR N/A 10/15/2022   Procedure: HERNIA REPAIR  VENTRAL ADULT;  Surgeon: Kandis Cocking, MD;  Location: [facility] ORS;  Service: General;  Laterality: N/A;   Social History   Occupational History   Occupation: Unemployed  Tobacco Use   Smoking status: Every Day    Current packs/day: 1.00    Average packs/day: 1 pack/day for 40.0 years (40.0 ttl pk-yrs)    Types: Cigarettes   Smokeless tobacco: Never  Substance and Sexual Activity   Alcohol use: No   Drug use: No   Sexual activity: Not on file

## 2022-11-05 ENCOUNTER — Other Ambulatory Visit: Payer: Medicare PPO

## 2022-11-05 ENCOUNTER — Encounter: Payer: Self-pay | Admitting: General Surgery

## 2022-11-05 ENCOUNTER — Ambulatory Visit (INDEPENDENT_AMBULATORY_CARE_PROVIDER_SITE_OTHER): Payer: Medicare PPO | Admitting: General Surgery

## 2022-11-05 VITALS — BP 105/61 | HR 103 | Temp 98.6°F | Ht 62.0 in | Wt 142.4 lb

## 2022-11-05 DIAGNOSIS — K436 Other and unspecified ventral hernia with obstruction, without gangrene: Secondary | ICD-10-CM | POA: Diagnosis not present

## 2022-11-05 DIAGNOSIS — Z09 Encounter for follow-up examination after completed treatment for conditions other than malignant neoplasm: Secondary | ICD-10-CM | POA: Diagnosis not present

## 2022-11-05 NOTE — Patient Instructions (Signed)

## 2022-11-05 NOTE — Progress Notes (Signed)
Rhonda Fields returns to the office today status post exploratory laparotomy with ventral hernia repair with mesh secondary to incarcerated ventral hernia.  She is staying with her friend and reports doing well.  She is tolerating a diet and having bowel movements.  She denies any nausea or vomiting.  She denies any bulges.  She denies any erythema from around her wound.  BP 105/61 (BP Location: Left Arm, Patient Position: Sitting, Cuff Size: Small)   Pulse (!) 103   Temp 98.6 F (37 C) (Oral)   Ht 5\' 2"  (1.575 m)   Wt 142 lb 6.4 oz (64.6 kg)   SpO2 96%   BMI 26.05 kg/m  Abdomen is soft, nontender, nondistended.  There is some induration over her scar but no erythema or fluctuance.  There is no evidence of recurrence of hernia at this time.   Status post ventral hernia repair for incarcerated hernia.  Doing well.  She will have home health PT come and see her given that she also recently had her hip replaced.  Continue with regular diet.  I will plan to see her in 3 months to check open on how she is doing.  Reiterated her lifting restrictions including no heavy lifting greater than 10 to 15 pounds for 6 weeks after surgery.

## 2022-11-06 ENCOUNTER — Telehealth: Payer: Self-pay

## 2022-11-06 ENCOUNTER — Telehealth: Payer: Self-pay | Admitting: Physician Assistant

## 2022-11-06 ENCOUNTER — Other Ambulatory Visit: Payer: Self-pay

## 2022-11-06 MED ORDER — IRON (FERROUS SULFATE) 325 (65 FE) MG PO TABS: 325.0000 mg | ORAL_TABLET | Freq: Every day | ORAL | 3 refills | Status: DC

## 2022-11-06 NOTE — Patient Outreach (Signed)
Care Management  Transitions of Care Program Transitions of Care Post-discharge week 3   11/06/2022 Name: Rhonda Fields MRN: [id number] DOB: 07/15/1950  Subjective: Rhonda Fields is a 72 y.o. year old female who is a primary care patient of Pickard, Priscille Heidelberg, MD. The Care Management team Engaged with patient Engaged with patient by telephone to assess and address transitions of care needs.   Consent to Services:  Patient was given information about care management services, agreed to services, and gave verbal consent to participate.   Assessment:           SDOH Interventions    Flowsheet Row Patient Outreach from 11/06/2022 in [place] POPULATION [facility] DEPARTMENT Telephone from 10/30/2022 in [facility] Community Care Coordination Telephone from 10/29/2022 in [place] POPULATION HEALTH DEPARTMENT Telephone from 10/22/2022 in [place] POPULATION HEALTH DEPARTMENT Clinical Support from 03/06/2022 in [facility] Clinical Support from 02/28/2021 in [facility]  SDOH Interventions        Food Insecurity Interventions Intervention Not Indicated -- Intervention Not Indicated Intervention Not Indicated Intervention Not Indicated Intervention Not Indicated  Housing Interventions Intervention Not Indicated -- -- -- Intervention Not Indicated Intervention Not Indicated  Transportation Interventions --  [The patient has been connected to transportation through her insurance] Other (Comment)  [Verified via 3 way call to [facility] that patient has transportation benefit, also contacted Music therapist at [employer] for in county appointments.] AMB Referral -- Intervention Not Indicated Intervention Not Indicated  Utilities Interventions Intervention Not Indicated -- -- -- Intervention Not Indicated --  Alcohol Usage Interventions Intervention Not Indicated (Score <7) -- -- -- Intervention Not Indicated (Score <7) --  Financial  Strain Interventions Intervention Not Indicated -- -- -- Intervention Not Indicated Intervention Not Indicated  Physical Activity Interventions Patient Declined -- -- -- Intervention Not Indicated Other (Comments)  [Encouraged pt to do chair exercises and stretching.]  Stress Interventions Intervention Not Indicated -- -- -- Intervention Not Indicated Intervention Not Indicated  [Due to daughter's health but states she is handling is okay.]  Social Connections Interventions Intervention Not Indicated -- -- -- -- Other (Comment)  [Encouraged pt [facility] [place]  Health Literacy Interventions Intervention Not Indicated -- -- -- -- --        Goals Addressed             This Visit's Progress    Patient Stated       Current Barriers:  Transportation barriers  RNCM Clinical Goal(s):  Patient will work with the Care Management team over the next 30 days to address Transition of Care Barriers: Medication Management Provider appointments Home Health services Transportation continue to work with RN Care Manager to address care management and care coordination needs related to  DMII and Pain as evidenced by adherence to CM Team Scheduled appointments through collaboration with RN Care manager, provider, and care team.   Interventions: Evaluation of current treatment plan related to  self management and patient's adherence to plan as established by provider   Diabetes Interventions:  (Status:  New goal.) Short Term Goal Assessed patient's understanding of A1c goal: <7% Reviewed medications with patient and discussed importance of medication adherence Discussed plans with patient for ongoing care management follow up and provided patient with direct contact information for care management team Lab Results  Component Value Date   HGBA1C 5.2 10/15/2022    Pain Interventions:  (Status:  New goal.) Short  Term Goal Pain assessment performed Medications  reviewed Reviewed provider established plan for pain management Discussed importance of adherence to all scheduled medical appointments Counseled on the importance of reporting any/all new or changed pain symptoms or management strategies to pain management provider Instructed the patient to medicate prior to HHPT appointments  Patient Goals/Self-Care Activities: Participate in Transition of Care Program/Attend [facility] scheduled calls Notify RN Care Manager of [facility] call rescheduling needs Take all medications as prescribed Attend all scheduled provider appointments  Follow Up Plan:  Telephone follow up appointment with care management team member scheduled for:  October 25, 11:00am          Plan: Telephone follow up appointment with care management team member scheduled for: November 13, 2022 at 11:00am  Deidre Ala, RN RN Care Manager [facility] [phone]

## 2022-11-06 NOTE — Telephone Encounter (Signed)
Pt called in to let pcp know that her swelling is going down a little bit. Pt stated that she is still a few muscle spasms but not as bad as they were. Pt states that she will continue to take med through weekend and will let pcp know how much progress it has made on Monday 11/09/22.  Cb#: [phone]

## 2022-11-06 NOTE — Patient Instructions (Addendum)
Visit Information  Thank you for taking time to visit with me today. Please don't hesitate to contact me if I can be of assistance to you before our next scheduled telephone appointment.  Our next appointment is by telephone  Following is a copy of your care plan:   Goals Addressed             This Visit's Progress    Patient Stated       Current Barriers:  Transportation barriers  RNCM Clinical Goal(s):  Patient will work with the Care Management team over the next 30 days to address Transition of Care Barriers: Medication Management Provider appointments Home Health services Transportation continue to work with RN Care Manager to address care management and care coordination needs related to  DMII and Pain as evidenced by adherence to CM Team Scheduled appointments through collaboration with RN Care manager, provider, and care team.   Interventions: Evaluation of current treatment plan related to  self management and patient's adherence to plan as established by provider   Diabetes Interventions:  (Status:  New goal.) Short Term Goal Assessed patient's understanding of A1c goal: <7% Reviewed medications with patient and discussed importance of medication adherence Discussed plans with patient for ongoing care management follow up and provided patient with direct contact information for care management team Lab Results  Component Value Date   HGBA1C 5.2 10/15/2022    Pain Interventions:  (Status:  New goal.) Short Term Goal Pain assessment performed Medications reviewed Reviewed provider established plan for pain management Discussed importance of adherence to all scheduled medical appointments Counseled on the importance of reporting any/all new or changed pain symptoms or management strategies to pain management provider Instructed the patient to medicate prior to HHPT appointments  Patient Goals/Self-Care Activities: Participate in Transition of Care Program/Attend  [facility] scheduled calls Notify RN Care Manager of [facility] call rescheduling needs Take all medications as prescribed Attend all scheduled provider appointments  Follow Up Plan:  Telephone follow up appointment with care management team member scheduled for:  October 25, 11:00am          Patient verbalizes understanding of instructions and care plan provided today and agrees to view in [place]. Active MyChart status and patient understanding of how to access instructions and care plan via MyChart confirmed with patient.     The patient has been provided with contact information for the care management team and has been advised to call with any health related questions or concerns.   Please call the care guide team at [phone] if you need to cancel or reschedule your appointment.   Please call the [place] National Suicide Prevention Lifeline: 2725883371 or TTY: 4705500111 TTY [phone]) [phone]) to talk to a trained counselor if you are experiencing a Mental Health or Behavioral Health Crisis or need someone to talk to.  Deidre Ala, RN Medical illustrator [facility]Population Health [phone]

## 2022-11-09 ENCOUNTER — Telehealth: Payer: Self-pay | Admitting: *Deleted

## 2022-11-09 DIAGNOSIS — Z7984 Long term (current) use of oral hypoglycemic drugs: Secondary | ICD-10-CM | POA: Diagnosis not present

## 2022-11-09 DIAGNOSIS — F32A Depression, unspecified: Secondary | ICD-10-CM | POA: Diagnosis not present

## 2022-11-09 DIAGNOSIS — F419 Anxiety disorder, unspecified: Secondary | ICD-10-CM | POA: Diagnosis not present

## 2022-11-09 DIAGNOSIS — E785 Hyperlipidemia, unspecified: Secondary | ICD-10-CM | POA: Diagnosis not present

## 2022-11-09 DIAGNOSIS — K589 Irritable bowel syndrome without diarrhea: Secondary | ICD-10-CM | POA: Diagnosis not present

## 2022-11-09 DIAGNOSIS — I10 Essential (primary) hypertension: Secondary | ICD-10-CM | POA: Diagnosis not present

## 2022-11-09 DIAGNOSIS — S72142D Displaced intertrochanteric fracture of left femur, subsequent encounter for closed fracture with routine healing: Secondary | ICD-10-CM | POA: Diagnosis not present

## 2022-11-09 DIAGNOSIS — E119 Type 2 diabetes mellitus without complications: Secondary | ICD-10-CM | POA: Diagnosis not present

## 2022-11-09 DIAGNOSIS — M25551 Pain in right hip: Secondary | ICD-10-CM | POA: Diagnosis not present

## 2022-11-09 NOTE — Telephone Encounter (Signed)
Pt called back in to give an update. Pt stated that above the knee is feeling good, below the knee is better but still a bit hard. Pt stated that she has terrible spasms at night that are becoming unbearable. Pt is still not able to drive as of yet to come in for an appt. Pt wanted to know if pcp would please send in a muscle relaxer to help with the spasm. Please advise  LOV: 11/03/22 CPE  PHARMACY: [facility] #[id number] Cheree Ditto, [place] - [address] AT [facility] [address] [facility] [address] [place], [place] [address] Phone: [phone]  Fax: [phone] DEA #: [id number]    CB#: [id number]

## 2022-11-09 NOTE — Telephone Encounter (Signed)
Received call from [place] with [facility] requesting orders for PT. Frequency 2 x week for 4 weeks confirmed and verbal orders given.

## 2022-11-11 DIAGNOSIS — K589 Irritable bowel syndrome without diarrhea: Secondary | ICD-10-CM | POA: Diagnosis not present

## 2022-11-11 DIAGNOSIS — S72142D Displaced intertrochanteric fracture of left femur, subsequent encounter for closed fracture with routine healing: Secondary | ICD-10-CM | POA: Diagnosis not present

## 2022-11-11 DIAGNOSIS — E119 Type 2 diabetes mellitus without complications: Secondary | ICD-10-CM | POA: Diagnosis not present

## 2022-11-11 DIAGNOSIS — M25551 Pain in right hip: Secondary | ICD-10-CM | POA: Diagnosis not present

## 2022-11-11 DIAGNOSIS — I10 Essential (primary) hypertension: Secondary | ICD-10-CM | POA: Diagnosis not present

## 2022-11-11 DIAGNOSIS — F419 Anxiety disorder, unspecified: Secondary | ICD-10-CM | POA: Diagnosis not present

## 2022-11-11 DIAGNOSIS — Z7984 Long term (current) use of oral hypoglycemic drugs: Secondary | ICD-10-CM | POA: Diagnosis not present

## 2022-11-11 DIAGNOSIS — F32A Depression, unspecified: Secondary | ICD-10-CM | POA: Diagnosis not present

## 2022-11-11 DIAGNOSIS — E785 Hyperlipidemia, unspecified: Secondary | ICD-10-CM | POA: Diagnosis not present

## 2022-11-12 ENCOUNTER — Telehealth: Payer: Self-pay

## 2022-11-12 ENCOUNTER — Other Ambulatory Visit: Payer: Self-pay

## 2022-11-12 ENCOUNTER — Other Ambulatory Visit: Payer: Self-pay | Admitting: Family Medicine

## 2022-11-12 MED ORDER — CYCLOBENZAPRINE HCL 5 MG PO TABS: 5.0000 mg | ORAL_TABLET | Freq: Three times a day (TID) | ORAL | 1 refills | Status: DC | PRN

## 2022-11-12 NOTE — Telephone Encounter (Signed)
Pt called in stating that this med cyclobenzaprine (FLEXERIL) 5 MG tablet [[id number]] was sent to the wrong pharmacy in [place]. Pt stated that she tried to have this med refill transferred to the [facility] pharmacy in [place], but pt was informed that the refill had been cancelled and her insurance would not cover it. Pt would like to know if this could be resent to this pharmacy please.  PHARMACY: [facility] #[id number] Cheree Ditto, [place] - [address] AT [facility] OF [address] [facility] [address] [place], [place] [address] Phone: [phone]  Fax: [phone] DEA #: [id number]  CB#: [phone]

## 2022-11-12 NOTE — Telephone Encounter (Signed)
error 

## 2022-11-12 NOTE — Patient Outreach (Signed)
Care Management  Transitions of Care Program Transitions of Care Post-discharge week 4   11/12/2022 Name: Rhonda Fields MRN: [id number] DOB: 09/25/1950  Subjective: Rhonda Fields is a 72 y.o. year old female who is a primary care patient of Pickard, Priscille Heidelberg, MD. The Care Management team Engaged with patient Engaged with patient by telephone to assess and address transitions of care needs.   Consent to Services:  Patient was given information about care management services, agreed to services, and gave verbal consent to participate.   Assessment:           SDOH Interventions    Flowsheet Row Patient Outreach from 11/06/2022 in [place] POPULATION HEALTH DEPARTMENT Telephone from 10/30/2022 in [facility] Community Care Coordination Telephone from 10/29/2022 in [place] POPULATION HEALTH DEPARTMENT Telephone from 10/22/2022 in [place] POPULATION HEALTH DEPARTMENT Clinical Support from 03/06/2022 in [facility] Clinical Support from 02/28/2021 in [facility]  SDOH Interventions        Food Insecurity Interventions Intervention Not Indicated -- Intervention Not Indicated Intervention Not Indicated Intervention Not Indicated Intervention Not Indicated  Housing Interventions Intervention Not Indicated -- -- -- Intervention Not Indicated Intervention Not Indicated  Transportation Interventions --  [The patient has been connected to transportation through her insurance] Other (Comment)  [Verified via 3 way call to [facility] that patient has transportation benefit, also contacted Music therapist at [employer] for in county appointments.] AMB Referral -- Intervention Not Indicated Intervention Not Indicated  Utilities Interventions Intervention Not Indicated -- -- -- Intervention Not Indicated --  Alcohol Usage Interventions Intervention Not Indicated (Score <7) -- -- -- Intervention Not Indicated (Score <7) --  Financial  Strain Interventions Intervention Not Indicated -- -- -- Intervention Not Indicated Intervention Not Indicated  Physical Activity Interventions Patient Declined -- -- -- Intervention Not Indicated Other (Comments)  [Encouraged pt to do chair exercises and stretching.]  Stress Interventions Intervention Not Indicated -- -- -- Intervention Not Indicated Intervention Not Indicated  [Due to daughter's health but states she is handling is okay.]  Social Connections Interventions Intervention Not Indicated -- -- -- -- Other (Comment)  [Encouraged pt to [facility] into the [facility] in [place].]  Health Literacy Interventions Intervention Not Indicated -- -- -- -- --        Goals Addressed             This Visit's Progress    Patient Stated       Current Barriers:  Transportation barriers  RNCM Clinical Goal(s):  Patient will work with the Care Management team over the next 30 days to address Transition of Care Barriers: Medication Management Provider appointments Home Health services Transportation continue to work with RN Care Manager to address care management and care coordination needs related to  DMII and Pain as evidenced by adherence to CM Team Scheduled appointments through collaboration with RN Care manager, provider, and care team.   Interventions: Evaluation of current treatment plan related to  self management and patient's adherence to plan as established by provider   Diabetes Interventions:  (Status:  New goal.) Short Term Goal Assessed patient's understanding of A1c goal: <7% Reviewed medications with patient and discussed importance of medication adherence Discussed plans with patient for ongoing care management follow up and provided patient with direct contact information for care management team Lab Results  Component Value Date   HGBA1C 5.2 10/15/2022    Pain Interventions:  (Status:  New goal.) Short  Term Goal Pain assessment performed Medications  reviewed Reviewed provider established plan for pain management Discussed importance of adherence to all scheduled medical appointments Counseled on the importance of reporting any/all new or changed pain symptoms or management strategies to pain management provider Instructed the patient to medicate prior to HHPT appointments  Patient Goals/Self-Care Activities: Participate in Transition of Care Program/Attend [facility] scheduled calls Notify RN Care Manager of [facility] call rescheduling needs Take all medications as prescribed Attend all scheduled provider appointments  Follow Up Plan:  RNCM to follow up with the patient Friday November 1 around 11:00am     [facility] Care Plan       Current Barriers:  Chronic Disease Management support and education needs related to DMII   RNCM Clinical Goal(s):  Patient will work with the Care Management team over the next 30 days to address Transition of Care Barriers: Diet/Nutrition/Food Resources Provider appointments Home Health services through collaboration with RN Care manager, provider, and care team.   Interventions: Evaluation of current treatment plan related to  self management and patient's adherence to plan as established by provider   Diabetes Interventions:   Short Term Goal Assessed patient's understanding of A1c goal: <8% Reviewed medications with patient and discussed importance of medication adherence Lab Results  Component Value Date   HGBA1C 5.2 10/15/2022    Patient Goals/Self-Care Activities: Participate in Transition of Care Program/Attend [facility] scheduled calls Notify RN Care Manager of [facility] call rescheduling needs Take all medications as prescribed  Follow Up Plan:  Telephone follow up appointment with care management team member scheduled for:  11/20/2022       The patient is progressing slowly in her recovery from her hip fracture. Home Health PT is coming twice a week and she is slowly increasing her activity and walking more  and further. She is able to take a shower by using the standard walker in the shower and she did a small amount of driving. Flexeril was added to her medication list for muscle spasms in the evening.   Deidre Ala, RN Medical illustrator [facility]Population Health [phone]

## 2022-11-12 NOTE — Patient Instructions (Signed)
Visit Information  Thank you for taking time to visit with me today. Please don't hesitate to contact me if I can be of assistance to you before our next scheduled telephone appointment.  Our next appointment is by telephone on November 20, 2022 at 11:00am  Following is a copy of your care plan:   Goals Addressed             This Visit's Progress    Patient Stated       Current Barriers:  Transportation barriers  RNCM Clinical Goal(s):  Patient will work with the Care Management team over the next 30 days to address Transition of Care Barriers: Medication Management Provider appointments Home Health services Transportation continue to work with RN Care Manager to address care management and care coordination needs related to  DMII and Pain as evidenced by adherence to CM Team Scheduled appointments through collaboration with RN Care manager, provider, and care team.   Interventions: Evaluation of current treatment plan related to  self management and patient's adherence to plan as established by provider   Diabetes Interventions:  (Status:  New goal.) Short Term Goal Assessed patient's understanding of A1c goal: <7% Reviewed medications with patient and discussed importance of medication adherence Discussed plans with patient for ongoing care management follow up and provided patient with direct contact information for care management team Lab Results  Component Value Date   HGBA1C 5.2 10/15/2022    Pain Interventions:  (Status:  New goal.) Short Term Goal Pain assessment performed Medications reviewed Reviewed provider established plan for pain management Discussed importance of adherence to all scheduled medical appointments Counseled on the importance of reporting any/all new or changed pain symptoms or management strategies to pain management provider Instructed the patient to medicate prior to HHPT appointments  Patient Goals/Self-Care Activities: Participate in  Transition of Care Program/Attend [facility] scheduled calls Notify RN Care Manager of [facility] call rescheduling needs Take all medications as prescribed Attend all scheduled provider appointments  Follow Up Plan:  RNCM to follow up with the patient Friday November 1 around 11:00am     [facility] Care Plan       Current Barriers:  Chronic Disease Management support and education needs related to DMII   RNCM Clinical Goal(s):  Patient will work with the Care Management team over the next 30 days to address Transition of Care Barriers: Diet/Nutrition/Food Resources Provider appointments Home Health services through collaboration with RN Care manager, provider, and care team.   Interventions: Evaluation of current treatment plan related to  self management and patient's adherence to plan as established by provider   Diabetes Interventions:   Short Term Goal Assessed patient's understanding of A1c goal: <8% Reviewed medications with patient and discussed importance of medication adherence Lab Results  Component Value Date   HGBA1C 5.2 10/15/2022    Patient Goals/Self-Care Activities: Participate in Transition of Care Program/Attend [facility] scheduled calls Notify RN Care Manager of [facility] call rescheduling needs Take all medications as prescribed  Follow Up Plan:  Telephone follow up appointment with care management team member scheduled for:  11/20/2022        Patient verbalizes understanding of instructions and care plan provided today and agrees to view in MyChart. Active MyChart status and patient understanding of how to access instructions and care plan via MyChart confirmed with patient.      Please call the care guide team at [phone] if you need to cancel or reschedule your appointment.   Please call the  [place] National Suicide Prevention Lifeline: 8590499629 or TTY: 925-591-6015 TTY (250)317-1176) to talk to a trained counselor if you are experiencing a Mental Health or Behavioral Health  Crisis or need someone to talk to.  Deidre Ala, RN Medical illustrator [facility]BC[facility]-Population Health [phone]

## 2022-11-16 DIAGNOSIS — E785 Hyperlipidemia, unspecified: Secondary | ICD-10-CM | POA: Diagnosis not present

## 2022-11-16 DIAGNOSIS — F419 Anxiety disorder, unspecified: Secondary | ICD-10-CM | POA: Diagnosis not present

## 2022-11-16 DIAGNOSIS — E119 Type 2 diabetes mellitus without complications: Secondary | ICD-10-CM | POA: Diagnosis not present

## 2022-11-16 DIAGNOSIS — F32A Depression, unspecified: Secondary | ICD-10-CM | POA: Diagnosis not present

## 2022-11-16 DIAGNOSIS — K589 Irritable bowel syndrome without diarrhea: Secondary | ICD-10-CM | POA: Diagnosis not present

## 2022-11-16 DIAGNOSIS — I10 Essential (primary) hypertension: Secondary | ICD-10-CM | POA: Diagnosis not present

## 2022-11-16 DIAGNOSIS — S72142D Displaced intertrochanteric fracture of left femur, subsequent encounter for closed fracture with routine healing: Secondary | ICD-10-CM | POA: Diagnosis not present

## 2022-11-16 DIAGNOSIS — Z7984 Long term (current) use of oral hypoglycemic drugs: Secondary | ICD-10-CM | POA: Diagnosis not present

## 2022-11-16 DIAGNOSIS — M25551 Pain in right hip: Secondary | ICD-10-CM | POA: Diagnosis not present

## 2022-11-18 DIAGNOSIS — I10 Essential (primary) hypertension: Secondary | ICD-10-CM | POA: Diagnosis not present

## 2022-11-18 DIAGNOSIS — E119 Type 2 diabetes mellitus without complications: Secondary | ICD-10-CM | POA: Diagnosis not present

## 2022-11-18 DIAGNOSIS — F419 Anxiety disorder, unspecified: Secondary | ICD-10-CM | POA: Diagnosis not present

## 2022-11-18 DIAGNOSIS — E785 Hyperlipidemia, unspecified: Secondary | ICD-10-CM | POA: Diagnosis not present

## 2022-11-18 DIAGNOSIS — F32A Depression, unspecified: Secondary | ICD-10-CM | POA: Diagnosis not present

## 2022-11-18 DIAGNOSIS — K589 Irritable bowel syndrome without diarrhea: Secondary | ICD-10-CM | POA: Diagnosis not present

## 2022-11-18 DIAGNOSIS — M25551 Pain in right hip: Secondary | ICD-10-CM | POA: Diagnosis not present

## 2022-11-18 DIAGNOSIS — Z7984 Long term (current) use of oral hypoglycemic drugs: Secondary | ICD-10-CM | POA: Diagnosis not present

## 2022-11-18 DIAGNOSIS — S72142D Displaced intertrochanteric fracture of left femur, subsequent encounter for closed fracture with routine healing: Secondary | ICD-10-CM | POA: Diagnosis not present

## 2022-11-19 ENCOUNTER — Ambulatory Visit (INDEPENDENT_AMBULATORY_CARE_PROVIDER_SITE_OTHER): Payer: Medicare PPO | Admitting: Family Medicine

## 2022-11-19 ENCOUNTER — Other Ambulatory Visit: Payer: Self-pay | Admitting: Family Medicine

## 2022-11-19 ENCOUNTER — Encounter: Payer: Self-pay | Admitting: Family Medicine

## 2022-11-19 VITALS — BP 110/60 | HR 107 | Temp 98.1°F | Ht 62.0 in | Wt 136.0 lb

## 2022-11-19 DIAGNOSIS — M7989 Other specified soft tissue disorders: Secondary | ICD-10-CM | POA: Diagnosis not present

## 2022-11-19 DIAGNOSIS — E119 Type 2 diabetes mellitus without complications: Secondary | ICD-10-CM

## 2022-11-19 LAB — BASIC METABOLIC PANEL WITH GFR
BUN/Creatinine Ratio: 20 (calc) (ref 6–22)
BUN: 26 mg/dL — ABNORMAL HIGH (ref 7–25)
CO2: 27 mmol/L (ref 20–32)
Calcium: 9.1 mg/dL (ref 8.6–10.4)
Chloride: 102 mmol/L (ref 98–110)
Creat: 1.29 mg/dL — ABNORMAL HIGH (ref 0.60–1.00)
Glucose, Bld: 100 mg/dL — ABNORMAL HIGH (ref 65–99)
Potassium: 4.7 mmol/L (ref 3.5–5.3)
Sodium: 140 mmol/L (ref 135–146)
eGFR: 44 mL/min/{1.73_m2} — ABNORMAL LOW (ref >=60)

## 2022-11-19 LAB — D-DIMER, QUANTITATIVE: D-Dimer, Quant: 2.08 ug{FEU}/mL — ABNORMAL HIGH (ref <0.50)

## 2022-11-19 MED ORDER — METFORMIN HCL 1000 MG PO TABS: 1000.0000 mg | ORAL_TABLET | Freq: Two times a day (BID) | ORAL | 1 refills | Status: DC

## 2022-11-19 NOTE — Telephone Encounter (Signed)
Prescription Request  11/19/2022  LOV: 11/19/2022  What is the name of the medication or equipment? metFORMIN (GLUCOPHAGE) 1000 MG tablet   Have you contacted your pharmacy to request a refill? Yes   Which pharmacy would you like this sent to?  [facility] - [place], [place] - [address] [place] [address] Phone: [phone] Fax: [phone]    Patient notified that their request is being sent to the clinical staff for review and that they should receive a response within 2 business days.   Please advise at [facility] [phone]

## 2022-11-19 NOTE — Telephone Encounter (Signed)
Prescription Request  11/19/2022  LOV: 11/19/2022  What is the name of the medication or equipment? triamcinolone cream (KENALOG) 0.1 %   Have you contacted your pharmacy to request a refill? Yes   Which pharmacy would you like this sent to?  [facility] - [place], [place] - [address] [place] [address] Phone: [phone] Fax: [phone]    Patient notified that their request is being sent to the clinical staff for review and that they should receive a response within 2 business days.   Please advise at [facility] [phone]

## 2022-11-19 NOTE — Telephone Encounter (Signed)
Requested medications are due for refill today.  yes  Requested medications are on the active medications list.  yes  Last refill. 03/17/2022 30g 3 rf  Future visit scheduled.   no  Notes to clinic.  Refill not delegated.    Requested Prescriptions  Pending Prescriptions Disp Refills   triamcinolone cream (KENALOG) 0.1 % 30 g 3    Sig: Apply topically 2 (two) times daily.     Not Delegated - Dermatology:  Corticosteroids Failed - 11/19/2022 10:04 AM      Failed - This refill cannot be delegated      Failed - Valid encounter within last 12 months    Recent Outpatient Visits           2 years ago OAB (overactive bladder)   [facility] Donita Brooks, MD   2 years ago Diabetes mellitus without complication [facility])   [facility] Pickard, Priscille Heidelberg, MD   2 years ago Foot pain, right   [facility] Donita Brooks, MD   2 years ago Diabetes mellitus without complication [facility])   [facility] Pickard, Priscille Heidelberg, MD   3 years ago Pure hypercholesterolemia   [facility] Pickard, Priscille Heidelberg, MD

## 2022-11-19 NOTE — Telephone Encounter (Signed)
Due to a system glitch the last office visit for this practice is not detected correctly.    LOV 11/19/2022 but also seen on 11/03/2022 for CPE.    Pt. Requesting this be sent to [facility].  Requested Prescriptions  Pending Prescriptions Disp Refills   metFORMIN (GLUCOPHAGE) 1000 MG tablet 180 tablet 1    Sig: Take 1 tablet (1,000 mg total) by mouth 2 (two) times daily with a meal.     Endocrinology:  Diabetes - Biguanides Failed - 11/19/2022 10:06 AM      Failed - B12 Level in normal range and within 720 days    No results found for: "VITAMINB12"       Failed - Valid encounter within last 6 months    Recent Outpatient Visits           2 years ago OAB (overactive bladder)   [facility] Donita Brooks, MD   2 years ago Diabetes mellitus without complication (HCC)   [facility] Donita Brooks, MD   2 years ago Foot pain, right   [facility] Pickard, Priscille Heidelberg, MD   2 years ago Diabetes mellitus without complication (HCC)   [facility] Pickard, Priscille Heidelberg, MD   3 years ago Pure hypercholesterolemia   [facility] Pickard, Priscille Heidelberg, MD              Passed - Cr in normal range and within 360 days    Creat  Date Value Ref Range Status  11/03/2022 0.98 0.60 - 1.00 mg/dL Final   Creatinine, Urine  Date Value Ref Range Status  08/04/2022 41 20 - 275 mg/dL Final         Passed - HBA1C is between 0 and 7.9 and within 180 days    Hgb A1c MFr Bld  Date Value Ref Range Status  10/15/2022 5.2 4.8 - 5.6 % Final    Comment:    (NOTE) Pre diabetes:          5.7%-6.4%  Diabetes:              >6.4%  Glycemic control for   <7.0% adults with diabetes          Passed - eGFR in normal range and within 360 days    GFR, Est African American  Date Value Ref Range Status  05/13/2020 64 > OR = 60 mL/min/1.62m2 Final   GFR, Est Non African American  Date Value Ref Range  Status  05/13/2020 55 (L) > OR = 60 mL/min/1.37m2 Final   GFR, Estimated  Date Value Ref Range Status  10/17/2022 >60 >60 mL/min Final    Comment:    (NOTE) Calculated using the CKD-EPI Creatinine Equation (2021)    GFR  Date Value Ref Range Status  04/27/2011 83.88 >60.00 mL/min Final   eGFR  Date Value Ref Range Status  11/03/2022 62 > OR = 60 mL/min/1.30m2 Final         Passed - CBC within normal limits and completed in the last 12 months    WBC  Date Value Ref Range Status  11/03/2022 8.9 3.8 - 10.8 Thousand/uL Final   RBC  Date Value Ref Range Status  11/03/2022 3.36 (L) 3.80 - 5.10 Million/uL Final   Hemoglobin  Date Value Ref Range Status  11/03/2022 9.8 (L) 11.7 - 15.5 g/dL Final   HCT  Date Value Ref Range Status  11/03/2022 32.0 (  L) 35.0 - 45.0 % Final   MCHC  Date Value Ref Range Status  11/03/2022 30.6 (L) 32.0 - 36.0 g/dL Final    Comment:    For adults, a slight decrease in the calculated MCHC value (in the range of 30 to 32 g/dL) is most likely not clinically significant; however, it should be interpreted with caution in correlation with other red cell parameters and the patient's clinical condition.    [facility]  Date Value Ref Range Status  11/03/2022 29.2 27.0 - 33.0 pg Final   MCV  Date Value Ref Range Status  11/03/2022 95.2 80.0 - 100.0 fL Final   No results found for: "PLTCOUNTKUC", "LABPLAT", "POCPLA" RDW  Date Value Ref Range Status  11/03/2022 14.0 11.0 - 15.0 % Final

## 2022-11-19 NOTE — Progress Notes (Signed)
Subjective:    Patient ID: Rhonda Fields, female    DOB: 30-Oct-1950, 72 y.o.   MRN: [id number]   11/03/22 Recently admitted with incarcerated ventral hernia.   Since being discharged from the hospital she reports constipation.  She states that her stools are hard.  She is going 2 or 3 days without having a bowel movement.  She denies any blood in the stool.  She denies any abdominal pain.  She denies any nausea or vomiting.  She is due for a flu shot.  She is due for shingles vaccine.  She declines both of these today.  She is due for mammogram.  She is also overdue for colonoscopy.  I explained this to her.  The patient does not want to schedule these tests right now.  She does have +1 pitting edema in both legs to the knees.  She denies any chest pain or shortness of breath or dyspnea on exertion or pleurisy.  At that time, my plan was: For the leg swelling, I recommended Lasix 40 mg daily as needed.  I anticipate 3 to 4 days she will be able to stop this.  The swelling is bilateral so I do not suspect a blood clot.  Her hemoglobin A1c on September 24 was outstanding at less than 6.  Therefore she can discontinue pioglitazone.  I want her to add MiraLAX twice daily for constipation.  If this is not helpful she can add Dulcolax as a stimulant laxative.  Recommended a flu shot.  Recommended Shingrix recommended a mammogram.  Recommended colonoscopy.  She declines these today.  I will repeat a CBC and a CMP along with a lipid panel.  11/19/22    Wt Readings from Last 3 Encounters:  11/19/22 136 lb (61.7 kg)  11/05/22 142 lb 6.4 oz (64.6 kg)  11/03/22 147 lb 9.6 oz (67 kg)   On lasix, patient has lost 6 lbs. patient continues to have +1 edema in both legs distal to the knee.  She also complains of cramps and spasms and pain in both legs distal to the knee.  There is no erythema or warmth.  She has negative Homans' sign bilaterally.  She denies any chest pain or shortness of breath.  She is  taking Lasix for the last 2 weeks with only minimal improvement. Past Medical History:  Diagnosis Date   Anxiety    Arrhythmia    Chronic headaches    Conjunctivitis 10/31/2012   Depression    Diabetes mellitus without complication (HCC)    Generalized abdominal pain 04/29/2011   Generalized headaches 04/08/2011   GERD (gastroesophageal reflux disease)    Hard of hearing    IBS (irritable bowel syndrome) 01/20/1984   Past Surgical History:  Procedure Laterality Date   ABDOMINAL HYSTERECTOMY     CHOLECYSTECTOMY     ERCP  04/20/2011   Procedure: ENDOSCOPIC RETROGRADE CHOLANGIOPANCREATOGRAPHY (ERCP);  Surgeon: Louis Meckel, MD;  Location: [facility];  Service: Endoscopy;  Laterality: N/A;  case is at 1430 in or   ERCP  04/20/2011   Procedure: ENDOSCOPIC RETROGRADE CHOLANGIOPANCREATOGRAPHY (ERCP);  Surgeon: Louis Meckel, MD;  Location: [facility];  Service: Gastroenterology;  Laterality: N/A;   INTRAMEDULLARY (IM) NAIL INTERTROCHANTERIC Right 09/23/2022   Procedure: INTRAMEDULLARY (IM) NAIL INTERTROCHANTERIC;  Surgeon: Tarry Kos, MD;  Location: [facility];  Service: Orthopedics;  Laterality: Right;   VENTRAL HERNIA REPAIR N/A 10/15/2022   Procedure: HERNIA REPAIR VENTRAL ADULT;  Surgeon: Kandis Cocking, MD;  Location: [facility];  Service: General;  Laterality: N/A;   Current Outpatient Medications on File Prior to Visit  Medication Sig Dispense Refill   Alcohol Swabs (DROPSAFE ALCOHOL PREP) 70 % PADS USE AS DIRECTED PRIOR TO MONITORING BLOOD GLUCOSE UP TO THREE TIMES DAILY 300 each 3   alendronate (FOSAMAX) 70 MG tablet TAKE 1 TABLET EVERY 7 DAYS WITH A FULL GLASS OF WATER ON AN EMPTY STOMACH (Patient taking differently: Take 70 mg by mouth every Thursday.) 12 tablet 3   Ascorbic Acid (VITAMIN C PO) Take 1 tablet by mouth daily.     aspirin EC 81 MG tablet Take 81 mg by mouth daily.     atorvastatin (LIPITOR) 40 MG tablet Take 1 tablet (40 mg total) by mouth daily. 90 tablet 1   Blood  Glucose Calibration (TRUE METRIX LEVEL 1) Low SOLN Use as directed to monitor FSBS 1x daily. Dx: E11.9 100 each 3   Blood Glucose Monitoring Suppl (TRUE METRIX METER) w/Device KIT USE AS DIRECTED 1 kit 3   Calcium Carb-Cholecalciferol (CALCIUM + VITAMIN D3 PO) Take 1 tablet by mouth daily.     cyclobenzaprine (FLEXERIL) 5 MG tablet Take 1 tablet (5 mg total) by mouth 3 (three) times daily as needed for muscle spasms. 30 tablet 1   docusate sodium (COLACE) 100 MG capsule Take 1 capsule (100 mg total) by mouth 2 (two) times daily as needed for mild constipation.     FLUoxetine (PROZAC) 20 MG capsule Take 1 capsule (20 mg total) by mouth daily. 90 capsule 1   fluticasone (FLONASE) 50 MCG/ACT nasal spray USE 2 SPRAYS IN EACH NOSTRIL EVERY DAY (Patient taking differently: Place 2 sprays into both nostrils daily as needed for allergies.) 48 g 1   furosemide (LASIX) 40 MG tablet Take 1 tablet (40 mg total) by mouth daily as needed for edema. 30 tablet 0   glucose blood test strip Use as instructed 100 each 3   HYDROcodone-acetaminophen (NORCO) 5-325 MG tablet Take 1 tablet by mouth every 6 (six) hours as needed for moderate pain. 30 tablet 0   ibuprofen (ADVIL) 600 MG tablet Take 1 tablet (600 mg total) by mouth every 6 (six) hours as needed. 30 tablet 0   Iron, Ferrous Sulfate, 325 (65 Fe) MG TABS Take 325 mg by mouth daily. 30 tablet 3   losartan (COZAAR) 25 MG tablet TAKE 1 TABLET EVERY DAY 90 tablet 1   metFORMIN (GLUCOPHAGE) 1000 MG tablet Take 1 tablet (1,000 mg total) by mouth 2 (two) times daily with a meal. 180 tablet 1   Multiple Vitamins-Minerals (ZINC PO) Take 1 tablet by mouth daily.     Omega-3 Fatty Acids (FISH OIL PO) Take 2,000 mg by mouth daily.     ondansetron (ZOFRAN-ODT) 4 MG disintegrating tablet Take 1 tablet (4 mg total) by mouth every 6 (six) hours as needed for nausea. 20 tablet 0   oxybutynin (DITROPAN) 5 MG tablet Take 2 tablets (10 mg total) by mouth 2 (two) times daily.  (Patient not taking: Reported on 11/06/2022) 180 tablet 1   oxyCODONE (OXY IR/ROXICODONE) 5 MG immediate release tablet Take 5 mg by mouth 4 (four) times daily as needed. (Patient not taking: Reported on 11/06/2022)     pantoprazole (PROTONIX) 40 MG tablet Take 1 tablet (40 mg total) by mouth 2 (two) times daily. 180 tablet 1   pioglitazone (ACTOS) 30 MG tablet Take 1 tablet (30 mg total) by mouth daily. (Patient not taking: Reported on  11/06/2022) 90 tablet 1   polyethylene glycol (MIRALAX / GLYCOLAX) 17 g packet Take 17 g by mouth daily as needed for moderate constipation or severe constipation.     triamcinolone cream (KENALOG) 0.1 % Apply topically 2 (two) times daily. (Patient taking differently: Apply 1 Application topically 2 (two) times daily as needed (skin irritation, itching).) 30 g 3   TRUEplus Lancets 33G MISC Use as directed to monitor FSBS 1x daily. Dx: E11.9 100 each 3   No current facility-administered medications on file prior to visit.   Allergies  Allergen Reactions   Ivp Dye [Iodinated Contrast Media] Itching and Nausea And Vomiting   Mobic [Meloxicam] Other (See Comments)    Lower extremity swelling   Penicillins Hives and Itching   Social History   Socioeconomic History   Marital status: Widowed    Spouse name: Not on file   Number of children: 4   Years of education: Not on file   Highest education level: Not on file  Occupational History   Occupation: Unemployed  Tobacco Use   Smoking status: Former    Current packs/day: 1.00    Average packs/day: 1 pack/day for 40.0 years (40.0 ttl pk-yrs)    Types: Cigarettes   Smokeless tobacco: Never  Substance and Sexual Activity   Alcohol use: No   Drug use: No   Sexual activity: Not Currently  Other Topics Concern   Not on file  Social History Narrative   Daughter lives next door.   Widowed. Was married x 37 years.   Social Determinants of Health   Financial Resource Strain: Low Risk  (11/06/2022)    Overall Financial Resource Strain (CARDIA)    Difficulty of Paying Living Expenses: Not hard at all  Food Insecurity: No Food Insecurity (11/06/2022)   Hunger Vital Sign    Worried About Running Out of Food in the Last Year: Never true    Ran Out of Food in the Last Year: Never true  Transportation Needs: Unmet Transportation Needs (11/06/2022)   PRAPARE - Administrator, Civil Service (Medical): Yes    Lack of Transportation (Non-Medical): Yes  Physical Activity: Inactive (11/06/2022)   Exercise Vital Sign    Days of Exercise per Week: 0 days    Minutes of Exercise per Session: 0 min  Stress: No Stress Concern Present (11/06/2022)   [employer] of Occupational Health - Occupational Stress Questionnaire    Feeling of Stress : Only a little  Social Connections: Socially Isolated (11/06/2022)   Social Connection and Isolation Panel [NHANES]    Frequency of Communication with Friends and Family: More than three times a week    Frequency of Social Gatherings with Friends and Family: More than three times a week    Attends Religious Services: Never    Database administrator or Organizations: No    Attends Banker Meetings: Never    Marital Status: Widowed  Intimate Partner Violence: Not At Risk (11/06/2022)   Humiliation, Afraid, Rape, and Kick questionnaire    Fear of Current or Ex-Partner: No    Emotionally Abused: No    Physically Abused: No    Sexually Abused: No     Review of Systems  All other systems reviewed and are negative.      Objective:   Physical Exam Vitals reviewed.  Constitutional:      Appearance: Normal appearance. She is normal weight.  Cardiovascular:     Rate and Rhythm: Normal rate and  regular rhythm.     Heart sounds: Normal heart sounds.  Pulmonary:     Effort: Pulmonary effort is normal. No respiratory distress.     Breath sounds: Normal breath sounds. No wheezing or rales.  Chest:     Chest wall: No tenderness.   Abdominal:     General: Bowel sounds are normal. There is no distension.     Palpations: Abdomen is soft.     Tenderness: There is no abdominal tenderness. There is no guarding or rebound.     Hernia: No hernia is present.  Musculoskeletal:     Right lower leg: Edema present.     Left lower leg: Edema present.  Neurological:     Mental Status: She is alert.           Assessment & Plan:  Leg swelling - Plan: Brain natriuretic peptide, D-dimer, quantitative, BASIC METABOLIC PANEL WITH GFR I will check a D-dimer.  If positive I will get an ultrasound of the legs and treat the patient empirically with blood thinners until a DVT is ruled out.  Check a BNP and if elevated get echocardiogram to evaluate for heart failure.  Check BMP to monitor renal function on Lasix

## 2022-11-20 ENCOUNTER — Other Ambulatory Visit: Payer: Self-pay

## 2022-11-20 ENCOUNTER — Other Ambulatory Visit: Payer: Self-pay | Admitting: Family Medicine

## 2022-11-20 DIAGNOSIS — M7989 Other specified soft tissue disorders: Secondary | ICD-10-CM

## 2022-11-20 DIAGNOSIS — R5381 Other malaise: Secondary | ICD-10-CM | POA: Diagnosis not present

## 2022-11-20 DIAGNOSIS — R7989 Other specified abnormal findings of blood chemistry: Secondary | ICD-10-CM

## 2022-11-20 DIAGNOSIS — M79604 Pain in right leg: Secondary | ICD-10-CM

## 2022-11-20 LAB — BRAIN NATRIURETIC PEPTIDE: Brain Natriuretic Peptide: 50 pg/mL (ref <100)

## 2022-11-20 MED ORDER — RIVAROXABAN 15 MG PO TABS: 15.0000 mg | ORAL_TABLET | Freq: Two times a day (BID) | ORAL | 3 refills | Status: DC

## 2022-11-20 NOTE — Patient Outreach (Signed)
Care Management  Transitions of Care Program Transitions of Care Post-discharge week 4   11/20/2022 Name: Rhonda Fields MRN: [id number] DOB: 1950/11/01  Subjective: Rhonda Fields is a 72 y.o. year old female who is a primary care patient of Pickard, Priscille Heidelberg, MD. The Care Management team Engaged with patient Engaged with patient by telephone to assess and address transitions of care needs.   Consent to Services:  Patient was given information about care management services, agreed to services, and gave verbal consent to participate.   Assessment:  Date of Discharge: 10/15/22 Discharge Facility: [facility]      SDOH Interventions    Flowsheet Row Patient Outreach from 11/06/2022 in [place] POPULATION HEALTH DEPARTMENT Telephone from 10/30/2022 in [facility] Community Care Coordination Telephone from 10/29/2022 in [place] POPULATION HEALTH DEPARTMENT Telephone from 10/22/2022 in  POPULATION HEALTH DEPARTMENT Clinical Support from 03/06/2022 in [facility] Clinical Support from 02/28/2021 in [facility]  SDOH Interventions        Food Insecurity Interventions Intervention Not Indicated -- Intervention Not Indicated Intervention Not Indicated Intervention Not Indicated Intervention Not Indicated  Housing Interventions Intervention Not Indicated -- -- -- Intervention Not Indicated Intervention Not Indicated  Transportation Interventions --  [The patient has been connected to transportation through her insurance] Other (Comment)  [Verified via 3 way call to [facility] that patient has transportation benefit, also contacted Marcelino Duster at [employer] for in county appointments.] AMB Referral -- Intervention Not Indicated Intervention Not Indicated  Utilities Interventions Intervention Not Indicated -- -- -- Intervention Not Indicated --  Alcohol Usage Interventions Intervention Not  Indicated (Score <7) -- -- -- Intervention Not Indicated (Score <7) --  Financial Strain Interventions Intervention Not Indicated -- -- -- Intervention Not Indicated Intervention Not Indicated  Physical Activity Interventions Patient Declined -- -- -- Intervention Not Indicated Other (Comments)  [Encouraged pt to do chair exercises and stretching.]  Stress Interventions Intervention Not Indicated -- -- -- Intervention Not Indicated Intervention Not Indicated  [Due to daughter's health but states she is handling is okay.]  Social Connections Interventions Intervention Not Indicated -- -- -- -- Other (Comment)  [[facility]our[facility] pt [facility] [place].]  Health Literacy Interventions Intervention Not Indicated -- -- -- -- --        Goals Addressed             This Visit's Progress    COMPLETED: Patient Stated       Current Barriers:  Transportation barriers  RNCM Clinical Goal(s):  Patient will work with the Care Management team over the next 30 days to address Transition of Care Barriers: Medication Management Provider appointments Home Health services Transportation continue to work with RN Care Manager to address care management and care coordination needs related to  DMII and Pain as evidenced by adherence to CM Team Scheduled appointments through collaboration with RN Care manager, provider, and care team.   Interventions: Evaluation of current treatment plan related to  self management and patient's adherence to plan as established by provider   Diabetes Interventions:  (Status:  New goal.) Short Term Goal Assessed patient's understanding of A1c goal: <7% Reviewed medications with patient and discussed importance of medication adherence Discussed plans with patient for ongoing care management follow up and provided patient with direct contact information for care management team Lab Results  Component Value Date   HGBA1C 5.2 10/15/2022  Pain  Interventions:  (Status:  New goal.) Short Term Goal Pain assessment performed Medications reviewed Reviewed provider established plan for pain management Discussed importance of adherence to all scheduled medical appointments Counseled on the importance of reporting any/all new or changed pain symptoms or management strategies to pain management provider Instructed the patient to medicate prior to HHPT appointments  Patient Goals/Self-Care Activities: Participate in Transition of Care Program/Attend [facility] scheduled calls Notify RN Care Manager of TOC call rescheduling needs Take all medications as prescribed Attend all scheduled provider appointments  Follow Up Plan:  RNCM to follow up with the patient Friday November 1 around 11:00am     COMPLETED: [facility] Care Plan       Current Barriers:  Chronic Disease Management support and education needs related to DMII   RNCM Clinical Goal(s):  Patient will work with the Care Management team over the next 30 days to address Transition of Care Barriers: Diet/Nutrition/Food Resources Provider appointments Home Health services through collaboration with RN Care manager, provider, and care team.   Interventions: Evaluation of current treatment plan related to  self management and patient's adherence to plan as established by provider   Diabetes Interventions:   Short Term Goal Assessed patient's understanding of A1c goal: <8% Reviewed medications with patient and discussed importance of medication adherence Lab Results  Component Value Date   HGBA1C 5.2 10/15/2022    Patient Goals/Self-Care Activities: Participate in Transition of Care Program/Attend [facility] scheduled calls Notify RN Care Manager of TOC call rescheduling needs Take all medications as prescribed  Follow Up Plan:  Telephone follow up appointment with care management team member scheduled for:  11/20/2022        Plan:  No further visits with [facility] nurse. The patient declines  transition to Longitudinal Care Team  Deidre Ala, RN RN Care Manager [facility] [phone]

## 2022-11-20 NOTE — Patient Instructions (Signed)
Visit Information  Thank you for taking time to visit with me today. Please don't hesitate to contact me if I can be of assistance to you before our next scheduled telephone appointment.  Following is a copy of your care plan:   Goals Addressed             This Visit's Progress    COMPLETED: Patient Stated       Current Barriers:  Transportation barriers  RNCM Clinical Goal(s):  Patient will work with the Care Management team over the next 30 days to address Transition of Care Barriers: Medication Management Provider appointments Home Health services Transportation continue to work with RN Care Manager to address care management and care coordination needs related to  DMII and Pain as evidenced by adherence to CM Team Scheduled appointments through collaboration with RN Care manager, provider, and care team.   Interventions: Evaluation of current treatment plan related to  self management and patient's adherence to plan as established by provider   Diabetes Interventions:  (Status:  New goal.) Short Term Goal Assessed patient's understanding of A1c goal: <7% Reviewed medications with patient and discussed importance of medication adherence Discussed plans with patient for ongoing care management follow up and provided patient with direct contact information for care management team Lab Results  Component Value Date   HGBA1C 5.2 10/15/2022    Pain Interventions:  (Status:  New goal.) Short Term Goal Pain assessment performed Medications reviewed Reviewed provider established plan for pain management Discussed importance of adherence to all scheduled medical appointments Counseled on the importance of reporting any/all new or changed pain symptoms or management strategies to pain management provider Instructed the patient to medicate prior to HHPT appointments  Patient Goals/Self-Care Activities: Participate in Transition of Care Program/Attend [facility] scheduled calls Notify  RN Care Manager of TOC call rescheduling needs Take all medications as prescribed Attend all scheduled provider appointments  Follow Up Plan:  RNCM to follow up with the patient Friday November 1 around 11:00am     COMPLETED: [facility] Care Plan       Current Barriers:  Chronic Disease Management support and education needs related to DMII   RNCM Clinical Goal(s):  Patient will work with the Care Management team over the next 30 days to address Transition of Care Barriers: Diet/Nutrition/Food Resources Provider appointments Home Health services through collaboration with RN Care manager, provider, and care team.   Interventions: Evaluation of current treatment plan related to  self management and patient's adherence to plan as established by provider   Diabetes Interventions:   Short Term Goal Assessed patient's understanding of A1c goal: <8% Reviewed medications with patient and discussed importance of medication adherence Lab Results  Component Value Date   HGBA1C 5.2 10/15/2022    Patient Goals/Self-Care Activities: Participate in Transition of Care Program/Attend [facility] scheduled calls Notify RN Care Manager of [facility] call rescheduling needs Take all medications as prescribed  Follow Up Plan:  Telephone follow up appointment with care management team member scheduled for:  11/20/2022        The patient verbalized understanding of instructions, educational materials, and care plan provided today and agreed to receive a mailed copy of patient instructions, educational materials, and care plan.    Please call the care guide team at [phone] if you need to cancel or reschedule your appointment.   Please call the Suicide and Crisis Lifeline: 988 call the [place] National Suicide Prevention Lifeline: (319) 718-8890 or TTY: 9172358086 TTY (952)[phone]) to talk  to a trained counselor if you are experiencing a Mental Health or Behavioral Health Crisis or need someone to talk  to.  Deidre Ala, RN Medical illustrator [facility]Population [facility] [phone]

## 2022-11-20 NOTE — Telephone Encounter (Signed)
Requested Prescriptions  Pending Prescriptions Disp Refills   furosemide (LASIX) 40 MG tablet [Pharmacy Med Name: Furosemide Oral Tablet 40 MG] 90 tablet 0    Sig: TAKE 1 TABLET EVERY DAY AS NEEDED FOR EDEMA     Cardiovascular:  Diuretics - Loop Failed - 11/20/2022 12:17 PM      Failed - Cr in normal range and within 180 days    Creat  Date Value Ref Range Status  11/19/2022 1.29 (H) 0.60 - 1.00 mg/dL Final   Creatinine, Urine  Date Value Ref Range Status  08/04/2022 41 20 - 275 mg/dL Final         Failed - Valid encounter within last 6 months    Recent Outpatient Visits           2 years ago OAB (overactive bladder)   [facility] Donita Brooks, MD   2 years ago Diabetes mellitus without complication (HCC)   [facility] Donita Brooks, MD   2 years ago Foot pain, right   [facility] Pickard, Priscille Heidelberg, MD   2 years ago Diabetes mellitus without complication (HCC)   [facility] Pickard, Priscille Heidelberg, MD   3 years ago Pure hypercholesterolemia   [facility] Pickard, Priscille Heidelberg, MD              Passed - K in normal range and within 180 days    Potassium  Date Value Ref Range Status  11/19/2022 4.7 3.5 - 5.3 mmol/L Final         Passed - Ca in normal range and within 180 days    Calcium  Date Value Ref Range Status  11/19/2022 9.1 8.6 - 10.4 mg/dL Final         Passed - Na in normal range and within 180 days    Sodium  Date Value Ref Range Status  11/19/2022 140 135 - 146 mmol/L Final         Passed - Cl in normal range and within 180 days    Chloride  Date Value Ref Range Status  11/19/2022 102 98 - 110 mmol/L Final         Passed - Mg Level in normal range and within 180 days    Magnesium  Date Value Ref Range Status  10/17/2022 1.8 1.7 - 2.4 mg/dL Final    Comment:    Performed at [facility], [address] [place], [place] [address]         Passed -  Last BP in normal range    BP Readings from Last 1 Encounters:  11/19/22 110/60

## 2022-11-21 ENCOUNTER — Other Ambulatory Visit: Payer: Self-pay | Admitting: Family Medicine

## 2022-11-21 DIAGNOSIS — J019 Acute sinusitis, unspecified: Secondary | ICD-10-CM

## 2022-11-23 ENCOUNTER — Other Ambulatory Visit: Payer: Self-pay | Admitting: Family Medicine

## 2022-11-23 DIAGNOSIS — S72142D Displaced intertrochanteric fracture of left femur, subsequent encounter for closed fracture with routine healing: Secondary | ICD-10-CM | POA: Diagnosis not present

## 2022-11-23 DIAGNOSIS — F32A Depression, unspecified: Secondary | ICD-10-CM | POA: Diagnosis not present

## 2022-11-23 DIAGNOSIS — M25551 Pain in right hip: Secondary | ICD-10-CM | POA: Diagnosis not present

## 2022-11-23 DIAGNOSIS — Z7984 Long term (current) use of oral hypoglycemic drugs: Secondary | ICD-10-CM | POA: Diagnosis not present

## 2022-11-23 DIAGNOSIS — F419 Anxiety disorder, unspecified: Secondary | ICD-10-CM | POA: Diagnosis not present

## 2022-11-23 DIAGNOSIS — E119 Type 2 diabetes mellitus without complications: Secondary | ICD-10-CM | POA: Diagnosis not present

## 2022-11-23 DIAGNOSIS — I10 Essential (primary) hypertension: Secondary | ICD-10-CM | POA: Diagnosis not present

## 2022-11-23 DIAGNOSIS — E785 Hyperlipidemia, unspecified: Secondary | ICD-10-CM | POA: Diagnosis not present

## 2022-11-23 DIAGNOSIS — K589 Irritable bowel syndrome without diarrhea: Secondary | ICD-10-CM | POA: Diagnosis not present

## 2022-11-23 NOTE — Telephone Encounter (Signed)
Requested Prescriptions  Pending Prescriptions Disp Refills   fluticasone (FLONASE) 50 MCG/ACT nasal spray [Pharmacy Med Name: Fluticasone Propionate Nasal Suspension 50 MCG/ACT] 48 g 3    Sig: USE 2 SPRAYS IN EACH NOSTRIL EVERY DAY     Ear, Nose, and Throat: Nasal Preparations - Corticosteroids Failed - 11/21/2022  3:09 AM      Failed - Valid encounter within last 12 months    Recent Outpatient Visits           2 years ago OAB (overactive bladder)   [facility] Donita Brooks, MD   2 years ago Diabetes mellitus without complication [facility])   [facility] Pickard, Priscille Heidelberg, MD   2 years ago Foot pain, right   [facility] Donita Brooks, MD   2 years ago Diabetes mellitus without complication [facility])   [facility] Pickard, Priscille Heidelberg, MD   3 years ago Pure hypercholesterolemia   [facility] Pickard, Priscille Heidelberg, MD

## 2022-11-24 ENCOUNTER — Telehealth: Payer: Self-pay

## 2022-11-24 ENCOUNTER — Other Ambulatory Visit: Payer: Medicare PPO

## 2022-11-24 ENCOUNTER — Ambulatory Visit
Admission: RE | Admit: 2022-11-24 | Discharge: 2022-11-24 | Disposition: A | Discharge status: Home / Self Care | Payer: Medicare PPO | Source: Ambulatory Visit | Attending: Family Medicine | Admitting: Family Medicine

## 2022-11-24 DIAGNOSIS — M79604 Pain in right leg: Secondary | ICD-10-CM

## 2022-11-24 DIAGNOSIS — R7989 Other specified abnormal findings of blood chemistry: Secondary | ICD-10-CM | POA: Diagnosis not present

## 2022-11-24 DIAGNOSIS — M79606 Pain in leg, unspecified: Secondary | ICD-10-CM | POA: Diagnosis not present

## 2022-11-24 DIAGNOSIS — M7989 Other specified soft tissue disorders: Secondary | ICD-10-CM

## 2022-11-24 NOTE — Telephone Encounter (Signed)
Pt called in to let pcp know that she just completed the imaging of her leg, and wanted to report that there were no blood clots found. Please advise.  Cb#: [phone]

## 2022-11-25 DIAGNOSIS — K589 Irritable bowel syndrome without diarrhea: Secondary | ICD-10-CM | POA: Diagnosis not present

## 2022-11-25 DIAGNOSIS — Z7984 Long term (current) use of oral hypoglycemic drugs: Secondary | ICD-10-CM | POA: Diagnosis not present

## 2022-11-25 DIAGNOSIS — I10 Essential (primary) hypertension: Secondary | ICD-10-CM | POA: Diagnosis not present

## 2022-11-25 DIAGNOSIS — S72142D Displaced intertrochanteric fracture of left femur, subsequent encounter for closed fracture with routine healing: Secondary | ICD-10-CM | POA: Diagnosis not present

## 2022-11-25 DIAGNOSIS — F419 Anxiety disorder, unspecified: Secondary | ICD-10-CM | POA: Diagnosis not present

## 2022-11-25 DIAGNOSIS — E785 Hyperlipidemia, unspecified: Secondary | ICD-10-CM | POA: Diagnosis not present

## 2022-11-25 DIAGNOSIS — E119 Type 2 diabetes mellitus without complications: Secondary | ICD-10-CM | POA: Diagnosis not present

## 2022-11-25 DIAGNOSIS — F32A Depression, unspecified: Secondary | ICD-10-CM | POA: Diagnosis not present

## 2022-11-25 DIAGNOSIS — M25551 Pain in right hip: Secondary | ICD-10-CM | POA: Diagnosis not present

## 2022-11-26 ENCOUNTER — Other Ambulatory Visit: Payer: Self-pay | Admitting: Family Medicine

## 2022-11-26 MED ORDER — HYDROCODONE-ACETAMINOPHEN 5-325 MG PO TABS: 1.0000 | ORAL_TABLET | Freq: Four times a day (QID) | ORAL | 0 refills | Status: DC | PRN

## 2022-11-29 ENCOUNTER — Other Ambulatory Visit: Payer: Self-pay | Admitting: Family Medicine

## 2022-11-30 DIAGNOSIS — E119 Type 2 diabetes mellitus without complications: Secondary | ICD-10-CM | POA: Diagnosis not present

## 2022-11-30 DIAGNOSIS — M25551 Pain in right hip: Secondary | ICD-10-CM | POA: Diagnosis not present

## 2022-11-30 DIAGNOSIS — F32A Depression, unspecified: Secondary | ICD-10-CM | POA: Diagnosis not present

## 2022-11-30 DIAGNOSIS — I10 Essential (primary) hypertension: Secondary | ICD-10-CM | POA: Diagnosis not present

## 2022-11-30 DIAGNOSIS — F419 Anxiety disorder, unspecified: Secondary | ICD-10-CM | POA: Diagnosis not present

## 2022-11-30 DIAGNOSIS — S72142D Displaced intertrochanteric fracture of left femur, subsequent encounter for closed fracture with routine healing: Secondary | ICD-10-CM | POA: Diagnosis not present

## 2022-11-30 DIAGNOSIS — Z7984 Long term (current) use of oral hypoglycemic drugs: Secondary | ICD-10-CM | POA: Diagnosis not present

## 2022-11-30 DIAGNOSIS — K589 Irritable bowel syndrome without diarrhea: Secondary | ICD-10-CM | POA: Diagnosis not present

## 2022-11-30 DIAGNOSIS — E785 Hyperlipidemia, unspecified: Secondary | ICD-10-CM | POA: Diagnosis not present

## 2022-12-01 NOTE — Telephone Encounter (Signed)
Refused Lasix 20 mg because it was sent to [facility].   This request is from [facility] [id number].

## 2022-12-02 ENCOUNTER — Telehealth: Payer: Self-pay | Admitting: Orthopaedic Surgery

## 2022-12-02 DIAGNOSIS — K589 Irritable bowel syndrome without diarrhea: Secondary | ICD-10-CM | POA: Diagnosis not present

## 2022-12-02 DIAGNOSIS — S72142D Displaced intertrochanteric fracture of left femur, subsequent encounter for closed fracture with routine healing: Secondary | ICD-10-CM | POA: Diagnosis not present

## 2022-12-02 DIAGNOSIS — M25551 Pain in right hip: Secondary | ICD-10-CM | POA: Diagnosis not present

## 2022-12-02 DIAGNOSIS — I10 Essential (primary) hypertension: Secondary | ICD-10-CM | POA: Diagnosis not present

## 2022-12-02 DIAGNOSIS — E785 Hyperlipidemia, unspecified: Secondary | ICD-10-CM | POA: Diagnosis not present

## 2022-12-02 DIAGNOSIS — F419 Anxiety disorder, unspecified: Secondary | ICD-10-CM | POA: Diagnosis not present

## 2022-12-02 DIAGNOSIS — F32A Depression, unspecified: Secondary | ICD-10-CM | POA: Diagnosis not present

## 2022-12-02 DIAGNOSIS — Z7984 Long term (current) use of oral hypoglycemic drugs: Secondary | ICD-10-CM | POA: Diagnosis not present

## 2022-12-02 DIAGNOSIS — E119 Type 2 diabetes mellitus without complications: Secondary | ICD-10-CM | POA: Diagnosis not present

## 2022-12-02 NOTE — Telephone Encounter (Signed)
Ok, thanks.

## 2022-12-02 NOTE — Telephone Encounter (Signed)
Tommy (PTA) from [facility] called to report 7 out of 10 pains. Pt went to bday party and has not taking pain meds. Tommy phone number is [phone].

## 2022-12-07 ENCOUNTER — Telehealth: Payer: Self-pay

## 2022-12-07 NOTE — Patient Outreach (Signed)
Attempted to contact patient regarding DM eye. Left voicemail for patient to return my call at [phone].  Nicholes Rough, CMA Care Guide [employer]

## 2022-12-08 ENCOUNTER — Telehealth: Payer: Self-pay | Admitting: Orthopaedic Surgery

## 2022-12-08 DIAGNOSIS — E119 Type 2 diabetes mellitus without complications: Secondary | ICD-10-CM | POA: Diagnosis not present

## 2022-12-08 DIAGNOSIS — F419 Anxiety disorder, unspecified: Secondary | ICD-10-CM | POA: Diagnosis not present

## 2022-12-08 DIAGNOSIS — K589 Irritable bowel syndrome without diarrhea: Secondary | ICD-10-CM | POA: Diagnosis not present

## 2022-12-08 DIAGNOSIS — E785 Hyperlipidemia, unspecified: Secondary | ICD-10-CM | POA: Diagnosis not present

## 2022-12-08 DIAGNOSIS — M25551 Pain in right hip: Secondary | ICD-10-CM | POA: Diagnosis not present

## 2022-12-08 DIAGNOSIS — S72142D Displaced intertrochanteric fracture of left femur, subsequent encounter for closed fracture with routine healing: Secondary | ICD-10-CM | POA: Diagnosis not present

## 2022-12-08 DIAGNOSIS — Z7984 Long term (current) use of oral hypoglycemic drugs: Secondary | ICD-10-CM | POA: Diagnosis not present

## 2022-12-08 DIAGNOSIS — F32A Depression, unspecified: Secondary | ICD-10-CM | POA: Diagnosis not present

## 2022-12-08 DIAGNOSIS — I10 Essential (primary) hypertension: Secondary | ICD-10-CM | POA: Diagnosis not present

## 2022-12-08 NOTE — Telephone Encounter (Signed)
Patient called and said that her hip has been hurting since the surgery in September. She said when she stands up and she hears something popping and it takes her a while to be able to get up and walk again. CB#[phone]

## 2022-12-20 DIAGNOSIS — R5381 Other malaise: Secondary | ICD-10-CM | POA: Diagnosis not present

## 2022-12-23 ENCOUNTER — Ambulatory Visit: Payer: Medicare PPO | Admitting: Physician Assistant

## 2022-12-23 ENCOUNTER — Encounter: Payer: Self-pay | Admitting: Orthopaedic Surgery

## 2022-12-23 ENCOUNTER — Other Ambulatory Visit (INDEPENDENT_AMBULATORY_CARE_PROVIDER_SITE_OTHER): Payer: Medicare PPO

## 2022-12-23 DIAGNOSIS — S72141A Displaced intertrochanteric fracture of right femur, initial encounter for closed fracture: Secondary | ICD-10-CM

## 2022-12-23 DIAGNOSIS — M25551 Pain in right hip: Secondary | ICD-10-CM

## 2022-12-23 NOTE — Progress Notes (Signed)
Post-Op Visit Note   Patient: Rhonda Fields           Date of Birth: May 20, 1950           MRN: [id number] Visit Date: 12/23/2022 PCP: Donita Brooks, MD   Assessment & Plan:  Chief Complaint:  Chief Complaint  Patient presents with   Right Hip - Follow-up   Visit Diagnoses:  1. Displaced intertrochanteric fracture of right femur, initial encounter for closed fracture (HCC)   2. Pain in right hip     Plan: Patient is a pleasant 72 year old female who comes in today approximately 3 months status post right hip IM nail from intertrochanteric femur fracture in addition to an intraoperative femoral shaft fracture, date of surgery 09/23/2022.  She has been improving although she notes popping to the lateral hip.  She is also complaining of occasional pain when she is rolling on her right side in the bed at night.  She takes an occasional Norco.  She has been in physical therapy where range of motion and gait training have improved.  Examination of her right hip reveals painless hip flexion.  No pain with logroll.  She is neurovascularly intact distally.  At this point, patient is clinically improving.  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  We would like to get approval for a bone stimulator.  She will follow-up with [place] in 6 weeks for repeat evaluation and x-rays of the right femur.  Call with concerns or questions.  Follow-Up Instructions: Return in about 6 weeks (around 02/03/2023).   Orders:  Orders Placed This Encounter  Procedures   XR FEMUR, MIN 2 VIEWS RIGHT   No orders of the defined types were placed in this encounter.   Imaging: No results found.  PMFS History: Patient Active Problem List   Diagnosis Date Noted   Incarcerated ventral hernia 10/15/2022   Hip fracture (HCC) 09/23/2022   Displaced spiral fracture of shaft of right femur, initial encounter for closed fracture (HCC) 09/23/2022   Displaced  intertrochanteric fracture of right femur, initial encounter for closed fracture (HCC) 09/22/2022   AKI (acute kidney injury) (HCC) 09/22/2022   Leukocytosis 09/22/2022   GERD (gastroesophageal reflux disease) 09/22/2022   Depression 09/22/2022   Anxiety 09/22/2022   HTN (hypertension) 09/22/2022   HLD (hyperlipidemia) 09/22/2022   DM2 (diabetes mellitus, type 2) (HCC) 01/30/2013   History of total hysterectomy with removal of both tubes and ovaries 04/08/2011   Past Medical History:  Diagnosis Date   Anxiety    Arrhythmia    Chronic headaches    Conjunctivitis 10/31/2012   Depression    Diabetes mellitus without complication (HCC)    Generalized abdominal pain 04/29/2011   Generalized headaches 04/08/2011   GERD (gastroesophageal reflux disease)    Hard of hearing    IBS (irritable bowel syndrome) 01/20/1984    Family History  Adopted: Yes  Problem Relation Age of Onset   Diabetes Other        Siblings, both sets of grandparents   Liver disease Mother    Kidney disease Mother    Liver disease Brother    Malignant hyperthermia Neg Hx     Past Surgical History:  Procedure Laterality Date   ABDOMINAL HYSTERECTOMY     CHOLECYSTECTOMY     ERCP  04/20/2011   Procedure: ENDOSCOPIC RETROGRADE CHOLANGIOPANCREATOGRAPHY (ERCP);  Surgeon: Louis Meckel, MD;  Location: [facility];  Service: Endoscopy;  Laterality: N/A;  case  is at 1430 in or   ERCP  04/20/2011   Procedure: ENDOSCOPIC RETROGRADE CHOLANGIOPANCREATOGRAPHY (ERCP);  Surgeon: Louis Meckel, MD;  Location: [facility];  Service: Gastroenterology;  Laterality: N/A;   INTRAMEDULLARY (IM) NAIL INTERTROCHANTERIC Right 09/23/2022   Procedure: INTRAMEDULLARY (IM) NAIL INTERTROCHANTERIC;  Surgeon: Tarry Kos, MD;  [facility];  Service: Orthopedics;  Laterality: Right;   VENTRAL HERNIA REPAIR N/A 10/15/2022   Procedure: HERNIA REPAIR VENTRAL ADULT;  Surgeon: Kandis Cocking, MD;  Location: [facility];  Service: General;   Laterality: N/A;   Social History   Occupational History   Occupation: Unemployed  Tobacco Use   Smoking status: Former    Current packs/day: 1.00    Average packs/day: 1 pack/day for 40.0 years (40.0 ttl pk-yrs)    Types: Cigarettes   Smokeless tobacco: Never  Substance and Sexual Activity   Alcohol use: No   Drug use: No   Sexual activity: Not Currently

## 2022-12-24 DIAGNOSIS — E785 Hyperlipidemia, unspecified: Secondary | ICD-10-CM | POA: Diagnosis not present

## 2022-12-24 DIAGNOSIS — F419 Anxiety disorder, unspecified: Secondary | ICD-10-CM | POA: Diagnosis not present

## 2022-12-24 DIAGNOSIS — M25551 Pain in right hip: Secondary | ICD-10-CM | POA: Diagnosis not present

## 2022-12-24 DIAGNOSIS — F32A Depression, unspecified: Secondary | ICD-10-CM | POA: Diagnosis not present

## 2022-12-24 DIAGNOSIS — I10 Essential (primary) hypertension: Secondary | ICD-10-CM | POA: Diagnosis not present

## 2022-12-24 DIAGNOSIS — E119 Type 2 diabetes mellitus without complications: Secondary | ICD-10-CM | POA: Diagnosis not present

## 2022-12-24 DIAGNOSIS — S72142D Displaced intertrochanteric fracture of left femur, subsequent encounter for closed fracture with routine healing: Secondary | ICD-10-CM | POA: Diagnosis not present

## 2022-12-24 DIAGNOSIS — Z7984 Long term (current) use of oral hypoglycemic drugs: Secondary | ICD-10-CM | POA: Diagnosis not present

## 2022-12-24 DIAGNOSIS — K589 Irritable bowel syndrome without diarrhea: Secondary | ICD-10-CM | POA: Diagnosis not present

## 2022-12-28 ENCOUNTER — Telehealth: Payer: Self-pay

## 2022-12-28 MED ORDER — HYDROCODONE-ACETAMINOPHEN 5-325 MG PO TABS: 1.0000 | ORAL_TABLET | Freq: Four times a day (QID) | ORAL | 0 refills | Status: DC | PRN

## 2022-12-28 NOTE — Telephone Encounter (Signed)
Per email

## 2022-12-28 NOTE — Telephone Encounter (Signed)
See message from [place].      Good morning Audree Camel you had a wonderful weekend.  Patient: Rhonda Fields MRN: [id number]  I have called and spoken with Mrs. Karas. However, she says she never heard about getting referred out for a bone stimulator. She's saying she doesn't want to do anything until she talks with y'all. I'm sure Mardella Layman PA and/or Dr. Roda Shutters discuss this with her, she mentioned about calling on Monday to touch base with you all about this. Not sure when she'll call but if you also want to reach out to her and let her know that they do want her to use the bone stimulator to help with the healing and hopefully I can get her situated this week. Thanks so much for your help and please let me know if you have any questions.  Sincerely,   Thomos Lemons

## 2022-12-28 NOTE — Telephone Encounter (Signed)
Called patient no answer. Per Mardella Layman they did discuss Bone stim last visit.  If she calls back okay to proceed with Bone stim or no?

## 2022-12-30 DIAGNOSIS — F419 Anxiety disorder, unspecified: Secondary | ICD-10-CM | POA: Diagnosis not present

## 2022-12-30 DIAGNOSIS — F32A Depression, unspecified: Secondary | ICD-10-CM | POA: Diagnosis not present

## 2022-12-30 DIAGNOSIS — K589 Irritable bowel syndrome without diarrhea: Secondary | ICD-10-CM | POA: Diagnosis not present

## 2022-12-30 DIAGNOSIS — E785 Hyperlipidemia, unspecified: Secondary | ICD-10-CM | POA: Diagnosis not present

## 2022-12-30 DIAGNOSIS — S72142D Displaced intertrochanteric fracture of left femur, subsequent encounter for closed fracture with routine healing: Secondary | ICD-10-CM | POA: Diagnosis not present

## 2022-12-30 DIAGNOSIS — E119 Type 2 diabetes mellitus without complications: Secondary | ICD-10-CM | POA: Diagnosis not present

## 2022-12-30 DIAGNOSIS — I10 Essential (primary) hypertension: Secondary | ICD-10-CM | POA: Diagnosis not present

## 2022-12-30 DIAGNOSIS — Z7984 Long term (current) use of oral hypoglycemic drugs: Secondary | ICD-10-CM | POA: Diagnosis not present

## 2022-12-30 DIAGNOSIS — M25551 Pain in right hip: Secondary | ICD-10-CM | POA: Diagnosis not present

## 2022-12-30 NOTE — Telephone Encounter (Signed)
Sent patient a Mychart message.

## 2023-01-01 DIAGNOSIS — F32A Depression, unspecified: Secondary | ICD-10-CM | POA: Diagnosis not present

## 2023-01-01 DIAGNOSIS — E785 Hyperlipidemia, unspecified: Secondary | ICD-10-CM | POA: Diagnosis not present

## 2023-01-01 DIAGNOSIS — I10 Essential (primary) hypertension: Secondary | ICD-10-CM | POA: Diagnosis not present

## 2023-01-01 DIAGNOSIS — Z7984 Long term (current) use of oral hypoglycemic drugs: Secondary | ICD-10-CM | POA: Diagnosis not present

## 2023-01-01 DIAGNOSIS — K589 Irritable bowel syndrome without diarrhea: Secondary | ICD-10-CM | POA: Diagnosis not present

## 2023-01-01 DIAGNOSIS — E119 Type 2 diabetes mellitus without complications: Secondary | ICD-10-CM | POA: Diagnosis not present

## 2023-01-01 DIAGNOSIS — S72142D Displaced intertrochanteric fracture of left femur, subsequent encounter for closed fracture with routine healing: Secondary | ICD-10-CM | POA: Diagnosis not present

## 2023-01-01 DIAGNOSIS — F419 Anxiety disorder, unspecified: Secondary | ICD-10-CM | POA: Diagnosis not present

## 2023-01-01 DIAGNOSIS — M25551 Pain in right hip: Secondary | ICD-10-CM | POA: Diagnosis not present

## 2023-01-04 ENCOUNTER — Other Ambulatory Visit: Payer: Self-pay | Admitting: Family Medicine

## 2023-01-04 DIAGNOSIS — E7800 Pure hypercholesterolemia, unspecified: Secondary | ICD-10-CM

## 2023-01-05 DIAGNOSIS — S72142D Displaced intertrochanteric fracture of left femur, subsequent encounter for closed fracture with routine healing: Secondary | ICD-10-CM | POA: Diagnosis not present

## 2023-01-05 DIAGNOSIS — F419 Anxiety disorder, unspecified: Secondary | ICD-10-CM | POA: Diagnosis not present

## 2023-01-05 DIAGNOSIS — F32A Depression, unspecified: Secondary | ICD-10-CM | POA: Diagnosis not present

## 2023-01-05 DIAGNOSIS — E119 Type 2 diabetes mellitus without complications: Secondary | ICD-10-CM | POA: Diagnosis not present

## 2023-01-05 DIAGNOSIS — E785 Hyperlipidemia, unspecified: Secondary | ICD-10-CM | POA: Diagnosis not present

## 2023-01-05 DIAGNOSIS — M25551 Pain in right hip: Secondary | ICD-10-CM | POA: Diagnosis not present

## 2023-01-05 DIAGNOSIS — I10 Essential (primary) hypertension: Secondary | ICD-10-CM | POA: Diagnosis not present

## 2023-01-05 DIAGNOSIS — Z7984 Long term (current) use of oral hypoglycemic drugs: Secondary | ICD-10-CM | POA: Diagnosis not present

## 2023-01-05 DIAGNOSIS — K589 Irritable bowel syndrome without diarrhea: Secondary | ICD-10-CM | POA: Diagnosis not present

## 2023-01-07 DIAGNOSIS — Z7984 Long term (current) use of oral hypoglycemic drugs: Secondary | ICD-10-CM | POA: Diagnosis not present

## 2023-01-07 DIAGNOSIS — I10 Essential (primary) hypertension: Secondary | ICD-10-CM | POA: Diagnosis not present

## 2023-01-07 DIAGNOSIS — F419 Anxiety disorder, unspecified: Secondary | ICD-10-CM | POA: Diagnosis not present

## 2023-01-07 DIAGNOSIS — E119 Type 2 diabetes mellitus without complications: Secondary | ICD-10-CM | POA: Diagnosis not present

## 2023-01-07 DIAGNOSIS — K589 Irritable bowel syndrome without diarrhea: Secondary | ICD-10-CM | POA: Diagnosis not present

## 2023-01-07 DIAGNOSIS — F32A Depression, unspecified: Secondary | ICD-10-CM | POA: Diagnosis not present

## 2023-01-07 DIAGNOSIS — S72142D Displaced intertrochanteric fracture of left femur, subsequent encounter for closed fracture with routine healing: Secondary | ICD-10-CM | POA: Diagnosis not present

## 2023-01-07 DIAGNOSIS — E785 Hyperlipidemia, unspecified: Secondary | ICD-10-CM | POA: Diagnosis not present

## 2023-01-07 DIAGNOSIS — M25551 Pain in right hip: Secondary | ICD-10-CM | POA: Diagnosis not present

## 2023-01-20 DIAGNOSIS — R5381 Other malaise: Secondary | ICD-10-CM | POA: Diagnosis not present

## 2023-02-04 ENCOUNTER — Ambulatory Visit: Payer: Medicare PPO | Admitting: General Surgery

## 2023-02-10 ENCOUNTER — Other Ambulatory Visit: Payer: Self-pay | Admitting: Family Medicine

## 2023-02-10 MED ORDER — FUROSEMIDE 40 MG PO TABS: ORAL_TABLET | ORAL | 0 refills | Status: DC

## 2023-02-10 NOTE — Telephone Encounter (Signed)
Requested medication (s) are due for refill today: Yes  Requested medication (s) are on the active medication list: Yes  Last refill:  11/06/22  Future visit scheduled: No  Notes to clinic:  Unable to refill per protocol due to failed labs, no updated results.      Requested Prescriptions  Pending Prescriptions Disp Refills   FEROSUL 325 (65 Fe) MG tablet [Pharmacy Med Name: FeroSul Oral Tablet 325 (65 Fe) MG] 90 tablet 3    Sig: TAKE 1 TABLET EVERY DAY     Endocrinology:  Minerals - Iron Supplementation Failed - 02/10/2023  3:11 PM      Failed - HGB in normal range and within 360 days    Hemoglobin  Date Value Ref Range Status  11/03/2022 9.8 (L) 11.7 - 15.5 g/dL Final         Failed - HCT in normal range and within 360 days    HCT  Date Value Ref Range Status  11/03/2022 32.0 (L) 35.0 - 45.0 % Final         Failed - RBC in normal range and within 360 days    RBC  Date Value Ref Range Status  11/03/2022 3.36 (L) 3.80 - 5.10 Million/uL Final         Failed - Fe (serum) in normal range and within 360 days    No results found for: "IRON", "IRONPCTSAT"       Failed - Ferritin in normal range and within 360 days    No results found for: "FERRITIN"       Failed - Valid encounter within last 12 months    Recent Outpatient Visits           2 years ago OAB (overactive bladder)   [facility] Donita Brooks, MD   2 years ago Diabetes mellitus without complication [facility])   [facility] Pickard, Priscille Heidelberg, MD   2 years ago Foot pain, right   [facility] Donita Brooks, MD   2 years ago Diabetes mellitus without complication [facility])   [facility] Pickard, Priscille Heidelberg, MD   3 years ago Pure hypercholesterolemia   [facility] Pickard, Priscille Heidelberg, MD

## 2023-02-20 DIAGNOSIS — R5381 Other malaise: Secondary | ICD-10-CM | POA: Diagnosis not present

## 2023-02-22 DIAGNOSIS — E119 Type 2 diabetes mellitus without complications: Secondary | ICD-10-CM | POA: Diagnosis not present

## 2023-02-22 LAB — HM DIABETES EYE EXAM

## 2023-02-25 ENCOUNTER — Encounter: Payer: Self-pay | Admitting: Family Medicine

## 2023-02-25 ENCOUNTER — Encounter: Payer: Self-pay | Admitting: *Deleted

## 2023-02-25 ENCOUNTER — Other Ambulatory Visit: Payer: Self-pay

## 2023-02-25 MED ORDER — FUROSEMIDE 40 MG PO TABS: ORAL_TABLET | ORAL | 0 refills | Status: DC

## 2023-03-20 DIAGNOSIS — R5381 Other malaise: Secondary | ICD-10-CM | POA: Diagnosis not present

## 2023-04-06 ENCOUNTER — Other Ambulatory Visit: Payer: Self-pay | Admitting: Family Medicine

## 2023-04-06 DIAGNOSIS — N3281 Overactive bladder: Secondary | ICD-10-CM

## 2023-04-07 NOTE — Telephone Encounter (Signed)
 Last OV 11/12/22, within protocol.  Requested Prescriptions  Pending Prescriptions Disp Refills   oxybutynin (DITROPAN) 5 MG tablet [Pharmacy Med Name: oxyBUTYnin Chloride Oral Tablet 5 MG] 360 tablet 0    Sig: TAKE 1 TABLET FOUR TIMES DAILY     Urology:  Bladder Agents Failed - 04/07/2023  3:37 PM      Failed - Valid encounter within last 12 months    Recent Outpatient Visits           2 years ago OAB (overactive bladder)   [facility] Donita Brooks, MD   2 years ago Diabetes mellitus without complication [facility])   [facility] Pickard, Priscille Heidelberg, MD   2 years ago Foot pain, right   [facility] Donita Brooks, MD   2 years ago Diabetes mellitus without complication [facility])   [facility] Pickard, Priscille Heidelberg, MD   3 years ago Pure hypercholesterolemia   [facility] Pickard, Priscille Heidelberg, MD

## 2023-04-20 DIAGNOSIS — R5381 Other malaise: Secondary | ICD-10-CM | POA: Diagnosis not present

## 2023-04-22 ENCOUNTER — Encounter: Payer: Self-pay | Admitting: Family Medicine

## 2023-04-22 ENCOUNTER — Ambulatory Visit: Admitting: Family Medicine

## 2023-04-22 VITALS — BP 120/72 | HR 71 | Temp 98.2°F | Ht 62.0 in | Wt 136.8 lb

## 2023-04-22 DIAGNOSIS — E119 Type 2 diabetes mellitus without complications: Secondary | ICD-10-CM | POA: Diagnosis not present

## 2023-04-22 MED ORDER — HYDROCODONE-ACETAMINOPHEN 5-325 MG PO TABS: 1.0000 | ORAL_TABLET | Freq: Four times a day (QID) | ORAL | 0 refills | Status: DC | PRN

## 2023-04-22 NOTE — Progress Notes (Signed)
 Subjective:    Patient ID: Rhonda Fields, female    DOB: Feb 24, 1950, 73 y.o.   MRN: [id number]  Patient is here today for follow-up of her diabetes.  She states that she is not checking her blood sugar.  However she denies any symptoms of hypoglycemia.  She would like to come off the metformin.  Her blood pressure today is excellent at 120/72.  She denies any shortness of breath or chest pain.  She declines the shingles vaccine.  Her pneumonia shot is up-to-date.  Diabetic foot exam was performed today and was normal.  She denies any neuropathy in her feet.  She denies any polyuria polydipsia or blurry vision. Past Medical History:  Diagnosis Date   Anxiety    Arrhythmia    Chronic headaches    Conjunctivitis 10/31/2012   Depression    Diabetes mellitus without complication (HCC)    Generalized abdominal pain 04/29/2011   Generalized headaches 04/08/2011   GERD (gastroesophageal reflux disease)    Hard of hearing    IBS (irritable bowel syndrome) 01/20/1984   Past Surgical History:  Procedure Laterality Date   ABDOMINAL HYSTERECTOMY     CHOLECYSTECTOMY     ERCP  04/20/2011   Procedure: ENDOSCOPIC RETROGRADE CHOLANGIOPANCREATOGRAPHY (ERCP);  Surgeon: Louis Meckel, MD;  Location: [facility];  Service: Endoscopy;  Laterality: N/A;  case is at 1430 in or   ERCP  04/20/2011   Procedure: ENDOSCOPIC RETROGRADE CHOLANGIOPANCREATOGRAPHY (ERCP);  Surgeon: Louis Meckel, MD;  Location: [facility];  Service: Gastroenterology;  Laterality: N/A;   INTRAMEDULLARY (IM) NAIL INTERTROCHANTERIC Right 09/23/2022   Procedure: INTRAMEDULLARY (IM) NAIL INTERTROCHANTERIC;  Surgeon: Tarry Kos, MD;  [facility];  Service: Orthopedics;  Laterality: Right;   VENTRAL HERNIA REPAIR N/A 10/15/2022   Procedure: HERNIA REPAIR VENTRAL ADULT;  Surgeon: Kandis Cocking, MD;  Location: [facility];  Service: General;  Laterality: N/A;   Current Outpatient Medications on File Prior to Visit  Medication Sig  Dispense Refill   Alcohol Swabs (DROPSAFE ALCOHOL PREP) 70 % PADS USE AS DIRECTED PRIOR TO MONITORING BLOOD GLUCOSE UP TO THREE TIMES DAILY 300 each 3   alendronate (FOSAMAX) 70 MG tablet TAKE 1 TABLET EVERY 7 DAYS WITH A FULL GLASS OF WATER ON AN EMPTY STOMACH (Patient taking differently: Take 70 mg by mouth every Thursday.) 12 tablet 3   Ascorbic Acid (VITAMIN C PO) Take 1 tablet by mouth daily.     aspirin EC 81 MG tablet Take 81 mg by mouth daily.     atorvastatin (LIPITOR) 40 MG tablet TAKE 1 TABLET EVERY DAY 90 tablet 3   Blood Glucose Calibration (TRUE METRIX LEVEL 1) Low SOLN Use as directed to monitor FSBS 1x daily. Dx: E11.9 100 each 3   Blood Glucose Monitoring Suppl (TRUE METRIX METER) w/Device KIT USE AS DIRECTED 1 kit 3   Calcium Carb-Cholecalciferol (CALCIUM + VITAMIN D3 PO) Take 1 tablet by mouth daily.     cyclobenzaprine (FLEXERIL) 5 MG tablet Take 1 tablet (5 mg total) by mouth 3 (three) times daily as needed for muscle spasms. 30 tablet 1   docusate sodium (COLACE) 100 MG capsule Take 1 capsule (100 mg total) by mouth 2 (two) times daily as needed for mild constipation.     FEROSUL 325 (65 Fe) MG tablet TAKE 1 TABLET EVERY DAY 90 tablet 3   FLUoxetine (PROZAC) 20 MG capsule Take 1 capsule (20 mg total) by mouth daily. 90 capsule 1  fluticasone (FLONASE) 50 MCG/ACT nasal spray USE 2 SPRAYS IN EACH NOSTRIL EVERY DAY 48 g 3   furosemide (LASIX) 40 MG tablet TAKE 1 TABLET EVERY DAY AS NEEDED FOR EDEMA 90 tablet 0   glucose blood test strip Use as instructed 100 each 3   losartan (COZAAR) 25 MG tablet TAKE 1 TABLET EVERY DAY 90 tablet 1   metFORMIN (GLUCOPHAGE) 1000 MG tablet Take 1 tablet (1,000 mg total) by mouth 2 (two) times daily with a meal. 180 tablet 1   Multiple Vitamins-Minerals (ZINC PO) Take 1 tablet by mouth daily.     Omega-3 Fatty Acids (FISH OIL PO) Take 2,000 mg by mouth daily.     ondansetron (ZOFRAN-ODT) 4 MG disintegrating tablet Take 1 tablet (4 mg total) by  mouth every 6 (six) hours as needed for nausea. 20 tablet 0   oxybutynin (DITROPAN) 5 MG tablet TAKE 1 TABLET FOUR TIMES DAILY 360 tablet 0   pantoprazole (PROTONIX) 40 MG tablet Take 1 tablet (40 mg total) by mouth 2 (two) times daily. 180 tablet 1   pioglitazone (ACTOS) 30 MG tablet Take 1 tablet (30 mg total) by mouth daily. 90 tablet 1   triamcinolone cream (KENALOG) 0.1 % Apply topically 2 (two) times daily. (Patient taking differently: Apply 1 Application topically 2 (two) times daily as needed (skin irritation, itching).) 30 g 3   TRUEplus Lancets 33G MISC Use as directed to monitor FSBS 1x daily. Dx: E11.9 100 each 3   HYDROcodone-acetaminophen (NORCO) 5-325 MG tablet Take 1 tablet by mouth every 6 (six) hours as needed for moderate pain (pain score 4-6). (Patient not taking: Reported on 04/22/2023) 30 tablet 0   ibuprofen (ADVIL) 600 MG tablet Take 1 tablet (600 mg total) by mouth every 6 (six) hours as needed. (Patient not taking: Reported on 04/22/2023) 30 tablet 0   polyethylene glycol (MIRALAX / GLYCOLAX) 17 g packet Take 17 g by mouth daily as needed for moderate constipation or severe constipation. (Patient not taking: Reported on 04/22/2023)     No current facility-administered medications on file prior to visit.   Allergies  Allergen Reactions   Ivp Dye [Iodinated Contrast Media] Itching and Nausea And Vomiting   Mobic [Meloxicam] Other (See Comments)    Lower extremity swelling   Penicillins Hives and Itching   Social History   Socioeconomic History   Marital status: Widowed    Spouse name: Not on file   Number of children: 4   Years of education: Not on file   Highest education level: Not on file  Occupational History   Occupation: Unemployed  Tobacco Use   Smoking status: Former    Current packs/day: 1.00    Average packs/day: 1 pack/day for 40.0 years (40.0 ttl pk-yrs)    Types: Cigarettes   Smokeless tobacco: Never  Substance and Sexual Activity   Alcohol use: No    Drug use: No   Sexual activity: Not Currently  Other Topics Concern   Not on file  Social History Narrative   Daughter lives next door.   Widowed. Was married x 37 years.   Social Drivers of Corporate investment banker Strain: Low Risk  (11/06/2022)   Overall Financial Resource Strain (CARDIA)    Difficulty of Paying Living Expenses: Not hard at all  Food Insecurity: No Food Insecurity (11/06/2022)   Hunger Vital Sign    Worried About Running Out of Food in the Last Year: Never true    Ran Out of  Food in the Last Year: Never true  Transportation Needs: Unmet Transportation Needs (11/06/2022)   PRAPARE - Transportation    Lack of Transportation (Medical): Yes    Lack of Transportation (Non-Medical): Yes  Physical Activity: Inactive (11/06/2022)   Exercise Vital Sign    Days of Exercise per Week: 0 days    Minutes of Exercise per Session: 0 min  Stress: No Stress Concern Present (11/06/2022)   [employer] of Occupational Health - Occupational Stress Questionnaire    Feeling of Stress : Only a little  Social Connections: Socially Isolated (11/06/2022)   Social Connection and Isolation Panel [NHANES]    Frequency of Communication with Friends and Family: More than three times a week    Frequency of Social Gatherings with Friends and Family: More than three times a week    Attends Religious Services: Never    Database administrator or Organizations: No    Attends Banker Meetings: Never    Marital Status: Widowed  Intimate Partner Violence: Not At Risk (11/06/2022)   Humiliation, Afraid, Rape, and Kick questionnaire    Fear of Current or Ex-Partner: No    Emotionally Abused: No    Physically Abused: No    Sexually Abused: No     Review of Systems  All other systems reviewed and are negative.      Objective:   Physical Exam Vitals reviewed.  Constitutional:      Appearance: Normal appearance. She is normal weight.  Cardiovascular:     Rate and  Rhythm: Normal rate and regular rhythm.     Heart sounds: Normal heart sounds.  Pulmonary:     Effort: Pulmonary effort is normal. No respiratory distress.     Breath sounds: Normal breath sounds. No wheezing or rales.  Chest:     Chest wall: No tenderness.  Abdominal:     General: Bowel sounds are normal. There is no distension.     Palpations: Abdomen is soft.     Tenderness: There is no abdominal tenderness. There is no guarding or rebound.     Hernia: No hernia is present.  Musculoskeletal:     Right lower leg: Edema present.     Left lower leg: Edema present.  Neurological:     Mental Status: She is alert.           Assessment & Plan:  Diabetes mellitus without complication (HCC) - Plan: Hemoglobin A1c, CBC with Differential/Platelet, COMPLETE METABOLIC PANEL WITHOUT GFR, Lipid panel, Microalbumin/Creatinine Ratio, Urine Blood pressure today is excellent.  I will check a hemoglobin A1c.  Goal A1c is less than 7.  I will also check a fasting lipid panel.  I would like to see her LDL cholesterol less than 865.  I will also check a microalbumin to creatinine ratio.  If elevated consider adding [place].  Recommended the shingles vaccine but the patient declined.

## 2023-04-23 LAB — COMPLETE METABOLIC PANEL WITHOUT GFR
AG Ratio: 1.6 (calc) (ref 1.0–2.5)
ALT: 27 U/L (ref 6–29)
AST: 29 U/L (ref 10–35)
Albumin: 4 g/dL (ref 3.6–5.1)
Alkaline phosphatase (APISO): 75 U/L (ref 37–153)
BUN: 14 mg/dL (ref 7–25)
CO2: 22 mmol/L (ref 20–32)
Calcium: 9.6 mg/dL (ref 8.6–10.4)
Chloride: 100 mmol/L (ref 98–110)
Creat: 0.93 mg/dL (ref 0.60–1.00)
Globulin: 2.5 g/dL (ref 1.9–3.7)
Glucose, Bld: 93 mg/dL (ref 65–99)
Potassium: 5.2 mmol/L (ref 3.5–5.3)
Sodium: 134 mmol/L — ABNORMAL LOW (ref 135–146)
Total Bilirubin: 0.3 mg/dL (ref 0.2–1.2)
Total Protein: 6.5 g/dL (ref 6.1–8.1)

## 2023-04-23 LAB — LIPID PANEL
Cholesterol: 101 mg/dL (ref <200)
HDL: 42 mg/dL — ABNORMAL LOW (ref >=50)
LDL Cholesterol (Calc): 32 mg/dL
Non-HDL Cholesterol (Calc): 59 mg/dL (ref <130)
Total CHOL/HDL Ratio: 2.4 (calc) (ref <5.0)
Triglycerides: 209 mg/dL — ABNORMAL HIGH (ref <150)

## 2023-04-23 LAB — CBC WITH DIFFERENTIAL/PLATELET
Absolute Lymphocytes: 1504 {cells}/uL (ref 850–3900)
Absolute Monocytes: 504 {cells}/uL (ref 200–950)
Basophils Absolute: 84 {cells}/uL (ref 0–200)
Basophils Relative: 1 %
Eosinophils Absolute: 252 {cells}/uL (ref 15–500)
Eosinophils Relative: 3 %
HCT: 37 % (ref 35.0–45.0)
Hemoglobin: 11.8 g/dL (ref 11.7–15.5)
MCH: 29.7 pg (ref 27.0–33.0)
MCHC: 31.9 g/dL — ABNORMAL LOW (ref 32.0–36.0)
MCV: 93.2 fL (ref 80.0–100.0)
MPV: 9.6 fL (ref 7.5–12.5)
Monocytes Relative: 6 %
Neutro Abs: 6056 {cells}/uL (ref 1500–7800)
Neutrophils Relative %: 72.1 %
Platelets: 299 10*3/uL (ref 140–400)
RBC: 3.97 10*6/uL (ref 3.80–5.10)
RDW: 13 % (ref 11.0–15.0)
Total Lymphocyte: 17.9 %
WBC: 8.4 10*3/uL (ref 3.8–10.8)

## 2023-04-23 LAB — HEMOGLOBIN A1C
Hgb A1c MFr Bld: 6.1 %{Hb} — ABNORMAL HIGH (ref <5.7)
Mean Plasma Glucose: 128 mg/dL
eAG (mmol/L): 7.1 mmol/L

## 2023-04-23 LAB — MICROALBUMIN / CREATININE URINE RATIO
Creatinine, Urine: 53 mg/dL (ref 20–275)
Microalb Creat Ratio: 8 mg/g{creat} (ref <30)
Microalb, Ur: 0.4 mg/dL

## 2023-05-20 DIAGNOSIS — R5381 Other malaise: Secondary | ICD-10-CM | POA: Diagnosis not present

## 2023-05-26 ENCOUNTER — Encounter (HOSPITAL_COMMUNITY): Payer: Self-pay

## 2023-05-30 ENCOUNTER — Other Ambulatory Visit: Payer: Self-pay | Admitting: Family Medicine

## 2023-05-30 DIAGNOSIS — N3281 Overactive bladder: Secondary | ICD-10-CM

## 2023-06-01 NOTE — Telephone Encounter (Signed)
 Requested Prescriptions  Pending Prescriptions Disp Refills   oxybutynin  (DITROPAN ) 5 MG tablet [Pharmacy Med Name: oxyBUTYnin  Chloride Oral Tablet 5 MG] 360 tablet 1    Sig: TAKE 1 TABLET FOUR TIMES DAILY     Urology:  Bladder Agents Passed - 06/01/2023  2:15 PM      Passed - Valid encounter within last 12 months    Recent Outpatient Visits           1 month ago Diabetes mellitus without complication [facility])   [facility] Pickard, Cisco Crest, MD   6 months ago Leg swelling   [place] [facility] Cheril Cork, Cisco Crest, MD   7 months ago Pure hypercholesterolemia   [place] [facility] Austine Lefort, MD   10 months ago Diabetes mellitus without complication [facility])    [facility] Pickard, Cisco Crest, MD   1 year ago Abdominal pain, right lateral    [facility] Jenelle Mis, [place]

## 2023-06-02 ENCOUNTER — Ambulatory Visit

## 2023-06-02 VITALS — Ht 62.0 in | Wt 136.0 lb

## 2023-06-02 DIAGNOSIS — Z0000 Encounter for general adult medical examination without abnormal findings: Secondary | ICD-10-CM

## 2023-06-02 NOTE — Patient Instructions (Signed)
 Ms. Gorka , Thank you for taking time out of your busy schedule to complete your Annual Wellness Visit with me. I enjoyed our conversation and look forward to speaking with you again next year. I, as well as your care team,  appreciate your ongoing commitment to your health goals. Please review the following plan we discussed and let me know if I can assist you in the future. Your Game plan/ To Do List    Follow up Visits: Next Medicare AWV with our clinical staff: In 1 year   Have you seen your provider in the last 6 months (3 months if uncontrolled diabetes)? Yes Next Office Visit with your provider: Scheduled for 10/28/23  Clinician Recommendations:  Aim for 30 minutes of exercise or brisk walking, 6-8 glasses of water, and 5 servings of fruits and vegetables each day.       This is a list of the screening recommended for you and due dates:  Health Maintenance  Topic Date Due   Screening for Lung Cancer  Never done   Mammogram  Never done   Zoster (Shingles) Vaccine (1 of 2) Never done   Colon Cancer Screening  05/04/2014   Pneumonia Vaccine (2 of 2 - PCV) 08/09/2020   COVID-19 Vaccine (3 - 2024-25 season) 09/20/2022   Complete foot exam   08/04/2023   Flu Shot  08/20/2023   Hemoglobin A1C  10/22/2023   Eye exam for diabetics  02/22/2024   Yearly kidney function blood test for diabetes  04/21/2024   Yearly kidney health urinalysis for diabetes  04/21/2024   Medicare Annual Wellness Visit  06/01/2024   DEXA scan (bone density measurement)  Completed   Hepatitis C Screening  Completed   HPV Vaccine  Aged Out   Meningitis B Vaccine  Aged Out   DTaP/Tdap/Td vaccine  Discontinued    Advanced directives: (ACP Link)Information on Advanced Care Planning can be found at [place]  Secretary of [facility] Advance Health Care Directives Advance Health Care Directives. [email or web address]   Advance Care Planning is important because it:  [x]  Makes sure you receive the medical care that is  consistent with your values, goals, and preferences  [x]  It provides guidance to your family and loved ones and reduces their decisional burden about whether or not they are making the right decisions based on your wishes.  Follow the link provided in your after visit summary or read over the paperwork we have mailed to you to help you started getting your Advance Directives in place. If you need assistance in completing these, please reach out to us  so that we can help you!  See attachments for Preventive Care and Fall Prevention Tips.

## 2023-06-02 NOTE — Progress Notes (Signed)
 ickard  Subjective:   Rhonda Fields is a 73 y.o. who presents for a Medicare Wellness preventive visit.  As a reminder, Annual Wellness Visits don't include a physical exam, and some assessments may be limited, especially if this visit is performed virtually. We may recommend an in-person visit if needed.  Visit Complete: Virtual I connected with  [name] on 06/02/23 by a audio enabled telemedicine application and verified that I am speaking with the correct person using two identifiers.  Patient Location: Home  Provider Location: Home Office  I discussed the limitations of evaluation and management by telemedicine. The patient expressed understanding and agreed to proceed.  Vital Signs: Because this visit was a virtual/telehealth visit, some criteria may be missing or patient reported. Any vitals not documented were not able to be obtained and vitals that have been documented are patient reported.  VideoDeclined- This patient declined Librarian, academic. Therefore the visit was completed with audio only.  Persons Participating in Visit: Patient.  AWV Questionnaire: No: Patient Medicare AWV questionnaire was not completed prior to this visit.  Cardiac Risk Factors include: advanced age (>8men, >17 women);diabetes mellitus;dyslipidemia;sedentary lifestyle;hypertension     Objective:     Today's Vitals   06/02/23 1152  Weight: 136 lb (61.7 kg)  Height: 5\' 2"  (1.575 m)   Body mass index is 24.87 kg/m.     06/02/2023    1:44 PM 10/15/2022   12:41 PM 10/15/2022    7:53 AM 09/22/2022    5:31 PM 09/22/2022    5:27 PM 03/06/2022    8:47 AM 01/01/2022   11:23 AM  Advanced Directives  Does Patient Have a Medical Advance Directive? No No No  No No No  Would patient like information on creating a medical advance directive? Yes (MAU/Ambulatory/Procedural Areas - Information given) No - Patient declined  No - Patient declined No -  Patient declined  No - Patient declined    Current Medications (verified) Outpatient Encounter Medications as of 06/02/2023  Medication Sig   Alcohol Swabs  (DROPSAFE ALCOHOL PREP) 70 % PADS USE AS DIRECTED PRIOR TO MONITORING BLOOD GLUCOSE UP TO THREE TIMES DAILY   alendronate  (FOSAMAX ) 70 MG tablet TAKE 1 TABLET EVERY 7 DAYS WITH A FULL GLASS OF WATER ON AN EMPTY STOMACH (Patient taking differently: Take 70 mg by mouth every Thursday.)   Ascorbic Acid (VITAMIN C PO) Take 1 tablet by mouth daily.   aspirin  EC 81 MG tablet Take 81 mg by mouth daily.   atorvastatin  (LIPITOR) 40 MG tablet TAKE 1 TABLET EVERY DAY   Blood Glucose Calibration (TRUE METRIX LEVEL 1) Low SOLN Use as directed to monitor FSBS 1x daily. Dx: E11.9   Blood Glucose Monitoring Suppl (TRUE METRIX METER) w/Device KIT USE AS DIRECTED   Calcium  Carb-Cholecalciferol (CALCIUM  + VITAMIN D3 PO) Take 1 tablet by mouth daily.   cyclobenzaprine  (FLEXERIL ) 5 MG tablet Take 1 tablet (5 mg total) by mouth 3 (three) times daily as needed for muscle spasms.   docusate sodium  (COLACE) 100 MG capsule Take 1 capsule (100 mg total) by mouth 2 (two) times daily as needed for mild constipation.   FEROSUL 325 (65 Fe) MG tablet TAKE 1 TABLET EVERY DAY   FLUoxetine  (PROZAC ) 20 MG capsule Take 1 capsule (20 mg total) by mouth daily.   fluticasone  (FLONASE ) 50 MCG/ACT nasal spray USE 2 SPRAYS IN EACH NOSTRIL EVERY DAY   furosemide  (LASIX ) 40 MG tablet TAKE 1 TABLET EVERY DAY AS  NEEDED FOR EDEMA   glucose blood test strip Use as instructed   HYDROcodone -acetaminophen  (NORCO) 5-325 MG tablet Take 1 tablet by mouth every 6 (six) hours as needed for moderate pain (pain score 4-6).   losartan  (COZAAR ) 25 MG tablet TAKE 1 TABLET EVERY DAY   metFORMIN  (GLUCOPHAGE ) 1000 MG tablet Take 1 tablet (1,000 mg total) by mouth 2 (two) times daily with a meal.   Multiple Vitamins-Minerals (ZINC PO) Take 1 tablet by mouth daily.   Omega-3 Fatty Acids (FISH OIL PO) Take  2,000 mg by mouth daily.   ondansetron  (ZOFRAN -ODT) 4 MG disintegrating tablet Take 1 tablet (4 mg total) by mouth every 6 (six) hours as needed for nausea.   oxybutynin  (DITROPAN ) 5 MG tablet TAKE 1 TABLET FOUR TIMES DAILY   pantoprazole  (PROTONIX ) 40 MG tablet Take 1 tablet (40 mg total) by mouth 2 (two) times daily.   pioglitazone  (ACTOS ) 30 MG tablet Take 1 tablet (30 mg total) by mouth daily.   triamcinolone  cream (KENALOG ) 0.1 % Apply topically 2 (two) times daily. (Patient taking differently: Apply 1 Application topically 2 (two) times daily as needed (skin irritation, itching).)   TRUEplus Lancets 33G MISC Use as directed to monitor FSBS 1x daily. Dx: E11.9   HYDROcodone -acetaminophen  (NORCO) 5-325 MG tablet Take 1 tablet by mouth every 6 (six) hours as needed for moderate pain (pain score 4-6). (Patient not taking: Reported on 06/02/2023)   ibuprofen  (ADVIL ) 600 MG tablet Take 1 tablet (600 mg total) by mouth every 6 (six) hours as needed. (Patient not taking: Reported on 06/02/2023)   polyethylene glycol (MIRALAX  / GLYCOLAX ) 17 g packet Take 17 g by mouth daily as needed for moderate constipation or severe constipation. (Patient not taking: Reported on 06/02/2023)   No facility-administered encounter medications on file as of 06/02/2023.    Allergies (verified) Ivp dye [iodinated contrast media], Mobic [meloxicam], and Penicillins   History: Past Medical History:  Diagnosis Date   Anxiety    Arrhythmia    Chronic headaches    Conjunctivitis 10/31/2012   Depression    Diabetes mellitus without complication (HCC)    Generalized abdominal pain 04/29/2011   Generalized headaches 04/08/2011   GERD (gastroesophageal reflux disease)    Hard of hearing    IBS (irritable bowel syndrome) 01/20/1984   Past Surgical History:  Procedure Laterality Date   ABDOMINAL HYSTERECTOMY     CHOLECYSTECTOMY     ERCP  04/20/2011   Procedure: ENDOSCOPIC RETROGRADE CHOLANGIOPANCREATOGRAPHY (ERCP);   Surgeon: Claudette Cue, MD;  Location: [facility];  Service: Endoscopy;  Laterality: N/A;  case is at 1430 in or   ERCP  04/20/2011   Procedure: ENDOSCOPIC RETROGRADE CHOLANGIOPANCREATOGRAPHY (ERCP);  Surgeon: Claudette Cue, MD;  Location: [facility];  Service: Gastroenterology;  Laterality: N/A;   INTRAMEDULLARY (IM) NAIL INTERTROCHANTERIC Right 09/23/2022   Procedure: INTRAMEDULLARY (IM) NAIL INTERTROCHANTERIC;  Surgeon: Wes Hamman, MD;  Location: [facility];  Service: Orthopedics;  Laterality: Right;   VENTRAL HERNIA REPAIR N/A 10/15/2022   Procedure: HERNIA REPAIR VENTRAL ADULT;  Surgeon: Barrett Lick, MD;  Location: [facility];  Service: General;  Laterality: N/A;   Family History  Adopted: Yes  Problem Relation Age of Onset   Diabetes Other        Siblings, both sets of grandparents   Liver disease Mother    Kidney disease Mother    Liver disease Brother    Malignant hyperthermia Neg Hx    Social History  Socioeconomic History   Marital status: Widowed    Spouse name: Not on file   Number of children: 4   Years of education: Not on file   Highest education level: Not on file  Occupational History   Occupation: Unemployed  Tobacco Use   Smoking status: Former    Current packs/day: 1.00    Average packs/day: 1 pack/day for 40.0 years (40.0 ttl pk-yrs)    Types: Cigarettes   Smokeless tobacco: Never  Substance and Sexual Activity   Alcohol use: No   Drug use: No   Sexual activity: Not Currently  Other Topics Concern   Not on file  Social History Narrative   Daughter lives next door.   Widowed. Was married x 37 years.   Social Drivers of Corporate investment banker Strain: Low Risk  (06/02/2023)   Overall Financial Resource Strain (CARDIA)    Difficulty of Paying Living Expenses: Not hard at all  Food Insecurity: No Food Insecurity (06/02/2023)   Hunger Vital Sign    Worried About Running Out of Food in the Last Year: Never true    Ran Out of Food in the Last Year:  Never true  Transportation Needs: Unmet Transportation Needs (06/02/2023)   PRAPARE - Administrator, Civil Service (Medical): Yes    Lack of Transportation (Non-Medical): Yes  Physical Activity: Inactive (06/02/2023)   Exercise Vital Sign    Days of Exercise per Week: 0 days    Minutes of Exercise per Session: 0 min  Stress: No Stress Concern Present (06/02/2023)   [employer] of Occupational Health - Occupational Stress Questionnaire    Feeling of Stress : Only a little  Social Connections: Socially Isolated (06/02/2023)   Social Connection and Isolation Panel [NHANES]    Frequency of Communication with Friends and Family: More than three times a week    Frequency of Social Gatherings with Friends and Family: More than three times a week    Attends Religious Services: Never    Database administrator or Organizations: No    Attends Banker Meetings: Never    Marital Status: Widowed    Tobacco Counseling Counseling given: Not Answered    Clinical Intake:  Pre-visit preparation completed: Yes  Pain : No/denies pain     Diabetes: Yes CBG done?: No Did pt. bring in CBG monitor from home?: No  Lab Results  Component Value Date   HGBA1C 6.1 (H) 04/22/2023   HGBA1C 5.2 10/15/2022   HGBA1C 6.5 (H) 08/04/2022     How often do you need to have someone help you when you read instructions, pamphlets, or other written materials from your doctor or pharmacy?: 1 - Never  Interpreter Needed?: No  Information entered by :: Seabron Cypress LPN   Activities of Daily Living     06/02/2023    1:43 PM 10/15/2022   10:50 PM  In your present state of health, do you have any difficulty performing the following activities:  Hearing? 0 1  Vision? 0 0  Difficulty concentrating or making decisions? 0 0  Walking or climbing stairs? 0   Dressing or bathing? 0   Doing errands, shopping? 0   Preparing Food and eating ? N   Using the Toilet? N   In the past  six months, have you accidently leaked urine? N   Do you have problems with loss of bowel control? N   Managing your Medications? N   Managing your Finances?  N   Housekeeping or managing your Housekeeping? N     Patient Care Team: Austine Lefort, MD as PCP - General (Family Medicine) Myrle Aspen, [facility] (Inactive) as Pharmacist (Pharmacist) Amber Juniper, Candice Chalet, RN as VBCI Care Management  Indicate any recent Medical Services you may have received from other than [facility] providers in the past year (date may be approximate).     Assessment:    This is a routine wellness examination for [place].  Hearing/Vision screen Hearing Screening - Comments:: Denies hearing difficulties   Vision Screening - Comments:: Wears rx glasses - up to date with routine eye exams with [facility]    Goals Addressed   None    Depression Screen     06/02/2023    1:38 PM 11/19/2022    8:28 AM 11/06/2022   10:55 AM 08/04/2022   10:00 AM 03/06/2022    8:45 AM 03/06/2022    8:44 AM 09/29/2021   12:19 PM  PHQ 2/9 Scores  PHQ - 2 Score 0  0 0 0 0 2  PHQ- 9 Score     0 0 8  Exception Documentation  Patient refusal         Fall Risk     06/02/2023    1:43 PM 11/19/2022    8:27 AM 08/04/2022   10:00 AM 09/29/2021   12:20 PM 02/28/2021    8:43 AM  Fall Risk   Falls in the past year? 0 1 0 1 1  Number falls in past yr: 0 0 0 1 0  Injury with Fall? 0 1 0 1 0  Risk for fall due to : No Fall Risks  No Fall Risks History of fall(s);Impaired balance/gait Impaired balance/gait;History of fall(s)  Follow up Falls prevention discussed;Education provided;Falls evaluation completed  Falls prevention discussed Falls prevention discussed Falls prevention discussed    MEDICARE RISK AT HOME:  Medicare Risk at Home Any stairs in or around the home?: No If so, are there any without handrails?: No Home free of loose throw rugs in walkways, pet beds, electrical cords, etc?: Yes Adequate lighting in your home  to reduce risk of falls?: Yes Life alert?: No Use of a cane, walker or w/c?: No Grab bars in the bathroom?: Yes Shower chair or bench in shower?: No Elevated toilet seat or a handicapped toilet?: Yes  TIMED UP AND GO:  Was the test performed?  No  Cognitive Function: 6CIT completed        06/02/2023    1:43 PM 03/06/2022    8:50 AM 02/28/2021    8:46 AM  6CIT Screen  What Year? 0 points 0 points 0 points  What month? 0 points 0 points 0 points  What time? 0 points 0 points 0 points  Count back from 20 0 points 0 points 0 points  Months in reverse 2 points 0 points 0 points  Repeat phrase 0 points 2 points 2 points  Total Score 2 points 2 points 2 points    Immunizations Immunization History  Administered Date(s) Administered   PFIZER(Purple Top)SARS-COV-2 Vaccination 03/15/2019, 04/12/2019   Pneumococcal Polysaccharide-23 07/30/2014, 08/10/2019    Screening Tests Health Maintenance  Topic Date Due   Lung Cancer Screening  Never done   MAMMOGRAM  Never done   Zoster Vaccines- Shingrix (1 of 2) Never done   Colonoscopy  05/04/2014   Pneumonia Vaccine 19+ Years old (2 of 2 - PCV) 08/09/2020   COVID-19 Vaccine (3 - 2024-25 season) 09/20/2022  FOOT EXAM  08/04/2023   INFLUENZA VACCINE  08/20/2023   HEMOGLOBIN A1C  10/22/2023   OPHTHALMOLOGY EXAM  02/22/2024   Diabetic kidney evaluation - eGFR measurement  04/21/2024   Diabetic kidney evaluation - Urine ACR  04/21/2024   Medicare Annual Wellness (AWV)  06/01/2024   DEXA SCAN  Completed   Hepatitis C Screening  Completed   HPV VACCINES  Aged Out   Meningococcal B Vaccine  Aged Out   DTaP/Tdap/Td  Discontinued    Health Maintenance  Health Maintenance Due  Topic Date Due   Lung Cancer Screening  Never done   MAMMOGRAM  Never done   Zoster Vaccines- Shingrix (1 of 2) Never done   Colonoscopy  05/04/2014   Pneumonia Vaccine 44+ Years old (2 of 2 - PCV) 08/09/2020   COVID-19 Vaccine (3 - 2024-25 season)  09/20/2022   Health Maintenance Items Addressed: Patient declines mammogram, colon screening and lung ct scan as well as vaccines at this time   Additional Screening:  Vision Screening: Recommended annual ophthalmology exams for early detection of glaucoma and other disorders of the eye.  Dental Screening: Recommended annual dental exams for proper oral hygiene  Community Resource Referral / Chronic Care Management: CRR required this visit?  No   CCM required this visit?  No   Plan:    I have personally reviewed and noted the following in the patient's chart:   Medical and social history Use of alcohol, tobacco or illicit drugs  Current medications and supplements including opioid prescriptions. Patient is not currently taking opioid prescriptions. Functional ability and status Nutritional status Physical activity Advanced directives List of other physicians Hospitalizations, surgeries, and ER visits in previous 12 months Vitals Screenings to include cognitive, depression, and falls Referrals and appointments  In addition, I have reviewed and discussed with patient certain preventive protocols, quality metrics, and best practice recommendations. A written personalized care plan for preventive services as well as general preventive health recommendations were provided to patient.   Seabron Cypress [place], [place]   4/54/0981   After Visit Summary: (MyChart) Due to this being a telephonic visit, the after visit summary with patients personalized plan was offered to patient via MyChart   Notes: Nothing significant to report at this time.

## 2023-06-05 ENCOUNTER — Other Ambulatory Visit: Payer: Self-pay | Admitting: Family Medicine

## 2023-06-05 DIAGNOSIS — E119 Type 2 diabetes mellitus without complications: Secondary | ICD-10-CM

## 2023-06-08 NOTE — Telephone Encounter (Signed)
 Requested Prescriptions  Pending Prescriptions Disp Refills   metFORMIN  (GLUCOPHAGE ) 1000 MG tablet [Pharmacy Med Name: metFORMIN  HCl Oral Tablet 1000 MG] 180 tablet 1    Sig: TAKE 1 TABLET TWICE DAILY WITH MEALS     Endocrinology:  Diabetes - Biguanides Failed - 06/08/2023  9:42 AM      Failed - eGFR in normal range and within 360 days    GFR, Est African American  Date Value Ref Range Status  05/13/2020 64 > OR = 60 mL/min/1.51m2 Final   GFR, Est Non African American  Date Value Ref Range Status  05/13/2020 55 (L) > OR = 60 mL/min/1.61m2 Final   GFR, Estimated  Date Value Ref Range Status  10/17/2022 >60 >60 mL/min Final    Comment:    (NOTE) Calculated using the CKD-EPI Creatinine Equation (2021)    GFR  Date Value Ref Range Status  04/27/2011 83.88 >60.00 mL/min Final   eGFR  Date Value Ref Range Status  11/19/2022 44 (L) > OR = 60 mL/min/1.106m2 Final         Failed - B12 Level in normal range and within 720 days    No results found for: "VITAMINB12"       Failed - Valid encounter within last 6 months    Recent Outpatient Visits           1 month ago Diabetes mellitus without complication (HCC)   [place] [facility] Austine Lefort, MD   6 months ago Leg swelling   [place] [facility] Austine Lefort, MD   7 months ago Pure hypercholesterolemia   [place] [facility] Cheril Cork, Cisco Crest, MD   10 months ago Diabetes mellitus without complication [facility])   [place] [facility] Austine Lefort, MD   1 year ago Abdominal pain, right lateral    [facility] Jenelle Mis, FNP              Passed - Cr in normal range and within 360 days    Creat  Date Value Ref Range Status  04/22/2023 0.93 0.60 - 1.00 mg/dL Final   Creatinine, Urine  Date Value Ref Range Status  04/22/2023 53 20 - 275 mg/dL Final         Passed - HBA1C is between 0 and 7.9  and within 180 days    Hgb A1c MFr Bld  Date Value Ref Range Status  04/22/2023 6.1 (H) <5.7 % of total Hgb Final    Comment:    For someone without known diabetes, a hemoglobin  A1c value between 5.7% and 6.4% is consistent with prediabetes and should be confirmed with a  follow-up test. . For someone with known diabetes, a value <7% indicates that their diabetes is well controlled. A1c targets should be individualized based on duration of diabetes, age, comorbid conditions, and other considerations. . This assay result is consistent with an increased risk of diabetes. . Currently, no consensus exists regarding use of hemoglobin A1c for diagnosis of diabetes for children. .          Passed - CBC within normal limits and completed in the last 12 months    WBC  Date Value Ref Range Status  04/22/2023 8.4 3.8 - 10.8 Thousand/uL Final   RBC  Date Value Ref Range Status  04/22/2023 3.97 3.80 - 5.10 Million/uL Final   Hemoglobin  Date Value Ref Range Status  04/22/2023 11.8 11.7 - 15.5 g/dL Final   HCT  Date Value Ref Range Status  04/22/2023 37.0 35.0 - 45.0 % Final   MCHC  Date Value Ref Range Status  04/22/2023 31.9 (L) 32.0 - 36.0 g/dL Final    Comment:    For adults, a slight decrease in the calculated MCHC value (in the range of 30 to 32 g/dL) is most likely not clinically significant; however, it should be interpreted with caution in correlation with other red cell parameters and the patient's clinical condition.    [facility]  Date Value Ref Range Status  04/22/2023 29.7 27.0 - 33.0 pg Final   MCV  Date Value Ref Range Status  04/22/2023 93.2 80.0 - 100.0 fL Final   No results found for: "PLTCOUNTKUC", "LABPLAT", "POCPLA" RDW  Date Value Ref Range Status  04/22/2023 13.0 11.0 - 15.0 % Final

## 2023-06-10 ENCOUNTER — Other Ambulatory Visit: Payer: Self-pay | Admitting: Family Medicine

## 2023-06-16 ENCOUNTER — Other Ambulatory Visit: Payer: Self-pay | Admitting: Family Medicine

## 2023-06-16 DIAGNOSIS — K219 Gastro-esophageal reflux disease without esophagitis: Secondary | ICD-10-CM

## 2023-06-16 DIAGNOSIS — E118 Type 2 diabetes mellitus with unspecified complications: Secondary | ICD-10-CM

## 2023-06-20 DIAGNOSIS — R5381 Other malaise: Secondary | ICD-10-CM | POA: Diagnosis not present

## 2023-07-03 ENCOUNTER — Other Ambulatory Visit: Payer: Self-pay | Admitting: Family Medicine

## 2023-07-20 DIAGNOSIS — R5381 Other malaise: Secondary | ICD-10-CM | POA: Diagnosis not present

## 2023-08-20 DIAGNOSIS — R5381 Other malaise: Secondary | ICD-10-CM | POA: Diagnosis not present

## 2023-09-07 ENCOUNTER — Other Ambulatory Visit: Payer: Self-pay | Admitting: Family Medicine

## 2023-09-07 DIAGNOSIS — F32A Depression, unspecified: Secondary | ICD-10-CM

## 2023-09-07 DIAGNOSIS — F419 Anxiety disorder, unspecified: Secondary | ICD-10-CM

## 2023-09-08 ENCOUNTER — Other Ambulatory Visit: Payer: Self-pay | Admitting: Family Medicine

## 2023-09-08 DIAGNOSIS — J019 Acute sinusitis, unspecified: Secondary | ICD-10-CM

## 2023-09-09 NOTE — Telephone Encounter (Signed)
 Requested Prescriptions  Pending Prescriptions Disp Refills   FLUoxetine  (PROZAC ) 20 MG capsule [Pharmacy Med Name: FLUOXETINE  HYDROCHLORIDE 20 MG Oral Capsule] 90 capsule 0    Sig: TAKE 1 CAPSULE EVERY DAY     Psychiatry:  Antidepressants - SSRI Passed - 09/09/2023 11:44 AM      Passed - Completed PHQ-2 or PHQ-9 in the last 360 days      Passed - Valid encounter within last 6 months    Recent Outpatient Visits           4 months ago Diabetes mellitus without complication [facility])   [place] [facility] Pickard, Butler DASEN, MD   9 months ago Leg swelling   [place] [facility] Pickard, Butler DASEN, MD   10 months ago Pure hypercholesterolemia   [place] [facility] Duanne Butler DASEN, MD   1 year ago Diabetes mellitus without complication [facility])   [place] [facility] Pickard, Butler DASEN, MD   1 year ago Abdominal pain, right lateral   [place] [facility] Kayla Jeoffrey RAMAN, [place]

## 2023-09-09 NOTE — Telephone Encounter (Signed)
 Requested Prescriptions  Pending Prescriptions Disp Refills   fluticasone  (FLONASE ) 50 MCG/ACT nasal spray [Pharmacy Med Name: FLUTICASONE  PROPIONATE 50 MCG/ACT Nasal Suspension] 48 g 0    Sig: USE 2 SPRAYS IN EACH NOSTRIL EVERY DAY     Ear, Nose, and Throat: Nasal Preparations - Corticosteroids Passed - 09/09/2023  1:19 PM      Passed - Valid encounter within last 12 months    Recent Outpatient Visits           4 months ago Diabetes mellitus without complication [facility])   [place] [facility] Pickard, Butler DASEN, MD   9 months ago Leg swelling   [place] [facility] Pickard, Butler DASEN, MD   10 months ago Pure hypercholesterolemia   [place] [facility] Duanne Butler DASEN, MD   1 year ago Diabetes mellitus without complication [facility])   [place] [facility] Pickard, Butler DASEN, MD   1 year ago Abdominal pain, right lateral   [place] [facility] Kayla Jeoffrey RAMAN, [place]

## 2023-09-20 DIAGNOSIS — R5381 Other malaise: Secondary | ICD-10-CM | POA: Diagnosis not present

## 2023-10-20 ENCOUNTER — Other Ambulatory Visit: Payer: Self-pay | Admitting: Family Medicine

## 2023-10-20 DIAGNOSIS — E7800 Pure hypercholesterolemia, unspecified: Secondary | ICD-10-CM

## 2023-10-20 DIAGNOSIS — R5381 Other malaise: Secondary | ICD-10-CM | POA: Diagnosis not present

## 2023-10-27 ENCOUNTER — Other Ambulatory Visit

## 2023-10-27 DIAGNOSIS — D72829 Elevated white blood cell count, unspecified: Secondary | ICD-10-CM

## 2023-10-27 DIAGNOSIS — M7989 Other specified soft tissue disorders: Secondary | ICD-10-CM

## 2023-10-27 DIAGNOSIS — E785 Hyperlipidemia, unspecified: Secondary | ICD-10-CM

## 2023-10-27 DIAGNOSIS — E7800 Pure hypercholesterolemia, unspecified: Secondary | ICD-10-CM

## 2023-10-27 DIAGNOSIS — N179 Acute kidney failure, unspecified: Secondary | ICD-10-CM | POA: Diagnosis not present

## 2023-10-27 DIAGNOSIS — I10 Essential (primary) hypertension: Secondary | ICD-10-CM | POA: Diagnosis not present

## 2023-10-27 DIAGNOSIS — E119 Type 2 diabetes mellitus without complications: Secondary | ICD-10-CM | POA: Diagnosis not present

## 2023-10-27 DIAGNOSIS — Z0000 Encounter for general adult medical examination without abnormal findings: Secondary | ICD-10-CM

## 2023-10-28 ENCOUNTER — Ambulatory Visit: Payer: Self-pay | Admitting: Family Medicine

## 2023-10-28 ENCOUNTER — Ambulatory Visit: Admitting: Family Medicine

## 2023-10-28 ENCOUNTER — Encounter: Payer: Self-pay | Admitting: Family Medicine

## 2023-10-28 VITALS — BP 132/76 | HR 113 | Temp 99.0°F | Ht 62.0 in | Wt 136.8 lb

## 2023-10-28 DIAGNOSIS — Z7984 Long term (current) use of oral hypoglycemic drugs: Secondary | ICD-10-CM

## 2023-10-28 DIAGNOSIS — E119 Type 2 diabetes mellitus without complications: Secondary | ICD-10-CM

## 2023-10-28 LAB — COMPLETE METABOLIC PANEL WITHOUT GFR
AG Ratio: 1.5 (calc) (ref 1.0–2.5)
ALT: 13 U/L (ref 6–29)
AST: 17 U/L (ref 10–35)
Albumin: 4.3 g/dL (ref 3.6–5.1)
Alkaline phosphatase (APISO): 57 U/L (ref 37–153)
BUN/Creatinine Ratio: 11 (calc) (ref 6–22)
BUN: 11 mg/dL (ref 7–25)
CO2: 21 mmol/L (ref 20–32)
Calcium: 9.5 mg/dL (ref 8.6–10.4)
Chloride: 96 mmol/L — ABNORMAL LOW (ref 98–110)
Creat: 1.04 mg/dL — ABNORMAL HIGH (ref 0.60–1.00)
Globulin: 2.8 g/dL (ref 1.9–3.7)
Glucose, Bld: 110 mg/dL — ABNORMAL HIGH (ref 65–99)
Potassium: 4.5 mmol/L (ref 3.5–5.3)
Sodium: 130 mmol/L — ABNORMAL LOW (ref 135–146)
Total Bilirubin: 0.5 mg/dL (ref 0.2–1.2)
Total Protein: 7.1 g/dL (ref 6.1–8.1)

## 2023-10-28 LAB — LIPID PANEL
Cholesterol: 99 mg/dL (ref <200)
HDL: 43 mg/dL — ABNORMAL LOW (ref >=50)
LDL Cholesterol (Calc): 27 mg/dL
Non-HDL Cholesterol (Calc): 56 mg/dL (ref <130)
Total CHOL/HDL Ratio: 2.3 (calc) (ref <5.0)
Triglycerides: 235 mg/dL — ABNORMAL HIGH (ref <150)

## 2023-10-28 LAB — CBC WITH DIFFERENTIAL/PLATELET
Absolute Lymphocytes: 1924 {cells}/uL (ref 850–3900)
Absolute Monocytes: 903 {cells}/uL (ref 200–950)
Basophils Absolute: 44 {cells}/uL (ref 0–200)
Basophils Relative: 0.3 %
Eosinophils Absolute: 222 {cells}/uL (ref 15–500)
Eosinophils Relative: 1.5 %
HCT: 36.6 % (ref 35.0–45.0)
Hemoglobin: 12.2 g/dL (ref 11.7–15.5)
MCH: 30.6 pg (ref 27.0–33.0)
MCHC: 33.3 g/dL (ref 32.0–36.0)
MCV: 91.7 fL (ref 80.0–100.0)
MPV: 9 fL (ref 7.5–12.5)
Monocytes Relative: 6.1 %
Neutro Abs: 11707 {cells}/uL — ABNORMAL HIGH (ref 1500–7800)
Neutrophils Relative %: 79.1 %
Platelets: 292 Thousand/uL (ref 140–400)
RBC: 3.99 Million/uL (ref 3.80–5.10)
RDW: 13.1 % (ref 11.0–15.0)
Total Lymphocyte: 13 %
WBC: 14.8 Thousand/uL — ABNORMAL HIGH (ref 3.8–10.8)

## 2023-10-28 LAB — HEMOGLOBIN A1C
Hgb A1c MFr Bld: 6.1 % — ABNORMAL HIGH (ref <5.7)
Mean Plasma Glucose: 128 mg/dL
eAG (mmol/L): 7.1 mmol/L

## 2023-10-28 LAB — VITAMIN B12: Vitamin B-12: 343 pg/mL (ref 200–1100)

## 2023-10-28 LAB — MICROALBUMIN / CREATININE URINE RATIO
Creatinine, Urine: 42 mg/dL (ref 20–275)
Microalb Creat Ratio: 14 mg/g{creat} (ref <30)
Microalb, Ur: 0.6 mg/dL

## 2023-10-28 MED ORDER — HYDROCODONE-ACETAMINOPHEN 5-325 MG PO TABS: 1.0000 | ORAL_TABLET | Freq: Four times a day (QID) | ORAL | 0 refills | Status: DC | PRN

## 2023-10-28 NOTE — Progress Notes (Signed)
 Subjective:    Patient ID: Rhonda Fields, female    DOB: November 12, 1950, 73 y.o.   MRN: [id number]  Patient is here today for follow-up of her diabetes.  She states that she is not checking her blood sugar.  However she denies any symptoms of hypoglycemia.  Over the weekend, the patient had a severe stomach bug.  She states that she had 3 to 4 days of vomiting and diarrhea.  This likely explains her elevated white blood cell count.  She is slightly tachycardic today but I feel that she is dehydrated.  She denies any abdominal pain.  She denies any further nausea or vomiting.  She denies any cough or shortness of breath or chest pain.  She denies any dysuria.  Her blood pressure today is 132/76.  She refuses a pneumonia vaccine, she refuses a flu shot Past Medical History:  Diagnosis Date   Anxiety    Arrhythmia    Chronic headaches    Conjunctivitis 10/31/2012   Depression    Diabetes mellitus without complication (HCC)    Generalized abdominal pain 04/29/2011   Generalized headaches 04/08/2011   GERD (gastroesophageal reflux disease)    Hard of hearing    IBS (irritable bowel syndrome) 01/20/1984   Past Surgical History:  Procedure Laterality Date   ABDOMINAL HYSTERECTOMY     CHOLECYSTECTOMY     ERCP  04/20/2011   Procedure: ENDOSCOPIC RETROGRADE CHOLANGIOPANCREATOGRAPHY (ERCP);  Surgeon: Lamar JONETTA Aho, MD;  Location: [facility];  Service: Endoscopy;  Laterality: N/A;  case is at 1430 in or   ERCP  04/20/2011   Procedure: ENDOSCOPIC RETROGRADE CHOLANGIOPANCREATOGRAPHY (ERCP);  Surgeon: Lamar JONETTA Aho, MD;  Location: [facility];  Service: Gastroenterology;  Laterality: N/A;   INTRAMEDULLARY (IM) NAIL INTERTROCHANTERIC Right 09/23/2022   Procedure: INTRAMEDULLARY (IM) NAIL INTERTROCHANTERIC;  Surgeon: Jerri Kay HERO, MD;  Location: [facility];  Service: Orthopedics;  Laterality: Right;   VENTRAL HERNIA REPAIR N/A 10/15/2022   Procedure: HERNIA REPAIR VENTRAL ADULT;  Surgeon: Marinda Jayson KIDD, MD;  Location: [facility];  Service: General;  Laterality: N/A;   Current Outpatient Medications on File Prior to Visit  Medication Sig Dispense Refill   Alcohol Swabs  (DROPSAFE ALCOHOL PREP) 70 % PADS USE AS DIRECTED PRIOR TO MONITORING BLOOD GLUCOSE UP TO THREE TIMES DAILY 300 each 3   alendronate  (FOSAMAX ) 70 MG tablet TAKE 1 TABLET EVERY 7 DAYS WITH A FULL GLASS OF WATER ON AN EMPTY STOMACH 12 tablet 3   Ascorbic Acid (VITAMIN C PO) Take 1 tablet by mouth daily.     aspirin  EC 81 MG tablet Take 81 mg by mouth daily.     atorvastatin  (LIPITOR) 40 MG tablet TAKE 1 TABLET EVERY DAY 90 tablet 1   Blood Glucose Calibration (TRUE METRIX LEVEL 1) Low SOLN Use as directed to monitor FSBS 1x daily. Dx: E11.9 100 each 3   Blood Glucose Monitoring Suppl (TRUE METRIX METER) w/Device KIT USE AS DIRECTED 1 kit 3   Calcium  Carb-Cholecalciferol (CALCIUM  + VITAMIN D3 PO) Take 1 tablet by mouth daily.     cyclobenzaprine  (FLEXERIL ) 5 MG tablet Take 1 tablet (5 mg total) by mouth 3 (three) times daily as needed for muscle spasms. 30 tablet 1   docusate sodium  (COLACE) 100 MG capsule Take 1 capsule (100 mg total) by mouth 2 (two) times daily as needed for mild constipation.     FEROSUL 325 (65 Fe) MG tablet TAKE 1 TABLET EVERY DAY 90 tablet 3  FLUoxetine  (PROZAC ) 20 MG capsule TAKE 1 CAPSULE EVERY DAY 90 capsule 0   fluticasone  (FLONASE ) 50 MCG/ACT nasal spray USE 2 SPRAYS IN EACH NOSTRIL EVERY DAY 48 g 0   furosemide  (LASIX ) 40 MG tablet TAKE 1 TABLET EVERY DAY AS NEEDED FOR EDEMA 90 tablet 3   glucose blood test strip Use as instructed 100 each 3   losartan  (COZAAR ) 25 MG tablet TAKE 1 TABLET EVERY DAY 90 tablet 1   metFORMIN  (GLUCOPHAGE ) 1000 MG tablet TAKE 1 TABLET TWICE DAILY WITH MEALS 180 tablet 1   Multiple Vitamins-Minerals (ZINC PO) Take 1 tablet by mouth daily.     Omega-3 Fatty Acids (FISH OIL PO) Take 2,000 mg by mouth daily.     ondansetron  (ZOFRAN -ODT) 4 MG disintegrating tablet Take 1 tablet  (4 mg total) by mouth every 6 (six) hours as needed for nausea. 20 tablet 0   oxybutynin  (DITROPAN ) 5 MG tablet TAKE 1 TABLET FOUR TIMES DAILY 360 tablet 1   pantoprazole  (PROTONIX ) 40 MG tablet TAKE 1 TABLET TWICE DAILY 180 tablet 3   pioglitazone  (ACTOS ) 30 MG tablet TAKE 1 TABLET EVERY DAY 90 tablet 3   triamcinolone  cream (KENALOG ) 0.1 % Apply topically 2 (two) times daily. (Patient taking differently: Apply 1 Application topically 2 (two) times daily as needed (skin irritation, itching).) 30 g 3   TRUEplus Lancets 33G MISC Use as directed to monitor FSBS 1x daily. Dx: E11.9 100 each 3   ibuprofen  (ADVIL ) 600 MG tablet Take 1 tablet (600 mg total) by mouth every 6 (six) hours as needed. (Patient not taking: Reported on 10/28/2023) 30 tablet 0   polyethylene glycol (MIRALAX  / GLYCOLAX ) 17 g packet Take 17 g by mouth daily as needed for moderate constipation or severe constipation. (Patient not taking: Reported on 10/28/2023)     No current facility-administered medications on file prior to visit.   Allergies  Allergen Reactions   Ivp Dye [Iodinated Contrast Media] Itching and Nausea And Vomiting   Mobic [Meloxicam] Other (See Comments)    Lower extremity swelling   Penicillins Hives and Itching   Social History   Socioeconomic History   Marital status: Widowed    Spouse name: Not on file   Number of children: 4   Years of education: Not on file   Highest education level: Not on file  Occupational History   Occupation: Unemployed  Tobacco Use   Smoking status: Former    Current packs/day: 1.00    Average packs/day: 1 pack/day for 40.0 years (40.0 ttl pk-yrs)    Types: Cigarettes   Smokeless tobacco: Never  Substance and Sexual Activity   Alcohol use: No   Drug use: No   Sexual activity: Not Currently  Other Topics Concern   Not on file  Social History Narrative   Daughter lives next door.   Widowed. Was married x 37 years.   Social Drivers of Corporate investment banker  Strain: Low Risk  (06/02/2023)   Overall Financial Resource Strain (CARDIA)    Difficulty of Paying Living Expenses: Not hard at all  Food Insecurity: No Food Insecurity (06/02/2023)   Hunger Vital Sign    Worried About Running Out of Food in the Last Year: Never true    Ran Out of Food in the Last Year: Never true  Transportation Needs: Unmet Transportation Needs (06/02/2023)   PRAPARE - Administrator, Civil Service (Medical): Yes    Lack of Transportation (Non-Medical): Yes  Physical  Activity: Inactive (06/02/2023)   Exercise Vital Sign    Days of Exercise per Week: 0 days    Minutes of Exercise per Session: 0 min  Stress: No Stress Concern Present (06/02/2023)   [employer] of Occupational Health - Occupational Stress Questionnaire    Feeling of Stress : Only a little  Social Connections: Socially Isolated (06/02/2023)   Social Connection and Isolation Panel    Frequency of Communication with Friends and Family: More than three times a week    Frequency of Social Gatherings with Friends and Family: More than three times a week    Attends Religious Services: Never    Database administrator or Organizations: No    Attends Banker Meetings: Never    Marital Status: Widowed  Intimate Partner Violence: Not At Risk (06/02/2023)   Humiliation, Afraid, Rape, and Kick questionnaire    Fear of Current or Ex-Partner: No    Emotionally Abused: No    Physically Abused: No    Sexually Abused: No     Review of Systems  All other systems reviewed and are negative.      Objective:   Physical Exam Vitals reviewed.  Constitutional:      Appearance: Normal appearance. She is normal weight.  Cardiovascular:     Rate and Rhythm: Normal rate and regular rhythm.     Heart sounds: Normal heart sounds.  Pulmonary:     Effort: Pulmonary effort is normal. No respiratory distress.     Breath sounds: Normal breath sounds. No wheezing or rales.  Chest:     Chest wall:  No tenderness.  Abdominal:     General: Bowel sounds are normal. There is no distension.     Palpations: Abdomen is soft.     Tenderness: There is no abdominal tenderness. There is no guarding or rebound.     Hernia: No hernia is present.  Musculoskeletal:     Right lower leg: Edema present.     Left lower leg: Edema present.  Skin:    Findings: No rash.  Neurological:     General: No focal deficit present.     Mental Status: She is alert and oriented to person, place, and time. Mental status is at baseline.           Assessment & Plan:  Diabetes mellitus without complication (HCC) Blood pressure today is outstanding.  Her most recent lab work is listed below Lab on 10/27/2023  Component Date Value Ref Range Status   WBC 10/27/2023 14.8 (H)  3.8 - 10.8 Thousand/uL Final   RBC 10/27/2023 3.99  3.80 - 5.10 Million/uL Final   Hemoglobin 10/27/2023 12.2  11.7 - 15.5 g/dL Final   HCT 89/91/7974 36.6  35.0 - 45.0 % Final   MCV 10/27/2023 91.7  80.0 - 100.0 fL Final   MCH 10/27/2023 30.6  27.0 - 33.0 pg Final   MCHC 10/27/2023 33.3  32.0 - 36.0 g/dL Final   Comment: For adults, a slight decrease in the calculated MCHC value (in the range of 30 to 32 g/dL) is most likely not clinically significant; however, it should be interpreted with caution in correlation with other red cell parameters and the patient's clinical condition.    RDW 10/27/2023 13.1  11.0 - 15.0 % Final   Platelets 10/27/2023 292  140 - 400 Thousand/uL Final   MPV 10/27/2023 9.0  7.5 - 12.5 fL Final   Neutro Abs 10/27/2023 11,707 (H)  1,500 - 7,800 cells/uL  Final   Absolute Lymphocytes 10/27/2023 1,924  850 - 3,900 cells/uL Final   Absolute Monocytes 10/27/2023 903  200 - 950 cells/uL Final   Eosinophils Absolute 10/27/2023 222  15 - 500 cells/uL Final   Basophils Absolute 10/27/2023 44  0 - 200 cells/uL Final   Neutrophils Relative % 10/27/2023 79.1  % Final   Total Lymphocyte 10/27/2023 13.0  % Final    Monocytes Relative 10/27/2023 6.1  % Final   Eosinophils Relative 10/27/2023 1.5  % Final   Basophils Relative 10/27/2023 0.3  % Final   Glucose, Bld 10/27/2023 110 (H)  65 - 99 mg/dL Final   Comment: .            Fasting reference interval . For someone without known diabetes, a glucose value between 100 and 125 mg/dL is consistent with prediabetes and should be confirmed with a follow-up test. .    BUN 10/27/2023 11  7 - 25 mg/dL Final   Creat 89/91/7974 1.04 (H)  0.60 - 1.00 mg/dL Final   BUN/Creatinine Ratio 10/27/2023 11  6 - 22 (calc) Final   Sodium 10/27/2023 130 (L)  135 - 146 mmol/L Final   Potassium 10/27/2023 4.5  3.5 - 5.3 mmol/L Final   Chloride 10/27/2023 96 (L)  98 - 110 mmol/L Final   CO2 10/27/2023 21  20 - 32 mmol/L Final   Calcium  10/27/2023 9.5  8.6 - 10.4 mg/dL Final   Total Protein 89/91/7974 7.1  6.1 - 8.1 g/dL Final   Albumin 89/91/7974 4.3  3.6 - 5.1 g/dL Final   Globulin 89/91/7974 2.8  1.9 - 3.7 g/dL (calc) Final   AG Ratio 10/27/2023 1.5  1.0 - 2.5 (calc) Final   Total Bilirubin 10/27/2023 0.5  0.2 - 1.2 mg/dL Final   Alkaline phosphatase (APISO) 10/27/2023 57  37 - 153 U/L Final   AST 10/27/2023 17  10 - 35 U/L Final   ALT 10/27/2023 13  6 - 29 U/L Final   Hgb A1c MFr Bld 10/27/2023 6.1 (H)  <5.7 % Final   Comment: For someone without known diabetes, a hemoglobin  A1c value between 5.7% and 6.4% is consistent with prediabetes and should be confirmed with a  follow-up test. . For someone with known diabetes, a value <7% indicates that their diabetes is well controlled. A1c targets should be individualized based on duration of diabetes, age, comorbid conditions, and other considerations. . This assay result is consistent with an increased risk of diabetes. . Currently, no consensus exists regarding use of hemoglobin A1c for diagnosis of diabetes for children. .    Mean Plasma Glucose 10/27/2023 128  mg/dL Final   eAG (mmol/L) 89/91/7974 7.1   mmol/L Final   Cholesterol 10/27/2023 99  <200 mg/dL Final   HDL 89/91/7974 43 (L)  > OR = 50 mg/dL Final   Triglycerides 89/91/7974 235 (H)  <150 mg/dL Final   Comment: . If a non-fasting specimen was collected, consider repeat triglyceride testing on a fasting specimen if clinically indicated.  Veatrice et al. J. of Clin. Lipidol. 2015;9:129-169. SABRA    LDL Cholesterol (Calc) 10/27/2023 27  mg/dL (calc) Final   Comment: Reference range: <100 . Desirable range <100 mg/dL for primary prevention;   <70 mg/dL for patients with CHD or diabetic patients  with > or = 2 CHD risk factors. SABRA LDL-C is now calculated using the Martin-Hopkins  calculation, which is a validated novel method providing  better accuracy than the Friedewald equation in  the  estimation of LDL-C.  Gladis APPLETHWAITE et al. SANDREA. 7986;689(80): 2061-2068  (http://education.[facility].com/faq/FAQ164)    Total CHOL/HDL Ratio 10/27/2023 2.3  <4.9 (calc) Final   Non-HDL Cholesterol (Calc) 10/27/2023 56  <130 mg/dL (calc) Final   Comment: For patients with diabetes plus 1 major ASCVD risk  factor, treating to a non-HDL-C goal of <100 mg/dL  (LDL-C of <29 mg/dL) is considered a therapeutic  option.    Creatinine, Urine 10/27/2023 42  20 - 275 mg/dL Final   Microalb, Ur 89/91/7974 0.6  mg/dL Final   Comment: Reference Range Not established    Microalb Creat Ratio 10/27/2023 14  <30 mg/g creat Final   Comment: . The ADA defines abnormalities in albumin excretion as follows: SABRA Albuminuria Category        Result (mg/g creatinine) . Normal to Mildly increased   <30 Moderately increased         30-299  Severely increased           > OR = 300 . The ADA recommends that at least two of three specimens collected within a 3-6 month period be abnormal before considering a patient to be within a diagnostic category.    Vitamin B-12 10/27/2023 343  200 - 1,100 pg/mL Final   Comment: . Please Note: Although the reference  range for vitamin B12 is [phone] pg/mL, it has been reported that between 5 and 10% of patients with values between 200 and 400 pg/mL may experience neuropsychiatric and hematologic abnormalities due to occult B12 deficiency; less than 1% of patients with values above 400 pg/mL will have symptoms. .    A1c is outstanding at 6.1.  Urine microalbumin to creatinine ratio is excellent at 14.  LDL cholesterol is outstanding.  I believe the elevated white blood cell count was due to viral gastroenteritis.  She is feeling much better today and declines to repeat a CBC.  She declines a flu shot or pneumonia vaccine.  She does request that I refill her pain medication that she takes daily for her back pain.  I refilled her 30 tablets of hydrocodone 

## 2023-11-01 ENCOUNTER — Other Ambulatory Visit: Payer: Self-pay | Admitting: Family Medicine

## 2023-11-01 DIAGNOSIS — E119 Type 2 diabetes mellitus without complications: Secondary | ICD-10-CM

## 2023-11-21 ENCOUNTER — Other Ambulatory Visit: Payer: Self-pay | Admitting: Family Medicine

## 2023-11-21 DIAGNOSIS — F419 Anxiety disorder, unspecified: Secondary | ICD-10-CM

## 2023-11-21 DIAGNOSIS — J019 Acute sinusitis, unspecified: Secondary | ICD-10-CM

## 2023-11-21 DIAGNOSIS — F32A Depression, unspecified: Secondary | ICD-10-CM

## 2023-11-22 ENCOUNTER — Encounter: Payer: Self-pay | Admitting: Radiology

## 2023-12-02 DIAGNOSIS — K219 Gastro-esophageal reflux disease without esophagitis: Secondary | ICD-10-CM | POA: Diagnosis not present

## 2023-12-02 DIAGNOSIS — E1122 Type 2 diabetes mellitus with diabetic chronic kidney disease: Secondary | ICD-10-CM | POA: Diagnosis not present

## 2023-12-02 DIAGNOSIS — N1832 Chronic kidney disease, stage 3b: Secondary | ICD-10-CM | POA: Diagnosis not present

## 2023-12-02 DIAGNOSIS — F325 Major depressive disorder, single episode, in full remission: Secondary | ICD-10-CM | POA: Diagnosis not present

## 2023-12-02 DIAGNOSIS — E785 Hyperlipidemia, unspecified: Secondary | ICD-10-CM | POA: Diagnosis not present

## 2023-12-02 DIAGNOSIS — E1165 Type 2 diabetes mellitus with hyperglycemia: Secondary | ICD-10-CM | POA: Diagnosis not present

## 2023-12-02 DIAGNOSIS — J439 Emphysema, unspecified: Secondary | ICD-10-CM | POA: Diagnosis not present

## 2023-12-02 DIAGNOSIS — E1143 Type 2 diabetes mellitus with diabetic autonomic (poly)neuropathy: Secondary | ICD-10-CM | POA: Diagnosis not present

## 2023-12-02 DIAGNOSIS — E1142 Type 2 diabetes mellitus with diabetic polyneuropathy: Secondary | ICD-10-CM | POA: Diagnosis not present

## 2023-12-16 ENCOUNTER — Emergency Department

## 2023-12-16 ENCOUNTER — Other Ambulatory Visit: Payer: Self-pay

## 2023-12-16 ENCOUNTER — Inpatient Hospital Stay
Admission: EM | Admit: 2023-12-16 | Discharge: 2023-12-21 | DRG: 481 | Disposition: A | Discharge status: Skilled Nursing Facility | Attending: Internal Medicine | Admitting: Internal Medicine

## 2023-12-16 DIAGNOSIS — E1122 Type 2 diabetes mellitus with diabetic chronic kidney disease: Secondary | ICD-10-CM | POA: Diagnosis present

## 2023-12-16 DIAGNOSIS — F32A Depression, unspecified: Secondary | ICD-10-CM | POA: Diagnosis present

## 2023-12-16 DIAGNOSIS — Z9889 Other specified postprocedural states: Secondary | ICD-10-CM | POA: Diagnosis not present

## 2023-12-16 DIAGNOSIS — E860 Dehydration: Secondary | ICD-10-CM | POA: Diagnosis present

## 2023-12-16 DIAGNOSIS — Z5982 Transportation insecurity: Secondary | ICD-10-CM

## 2023-12-16 DIAGNOSIS — Z7984 Long term (current) use of oral hypoglycemic drugs: Secondary | ICD-10-CM

## 2023-12-16 DIAGNOSIS — Z79899 Other long term (current) drug therapy: Secondary | ICD-10-CM

## 2023-12-16 DIAGNOSIS — W1830XA Fall on same level, unspecified, initial encounter: Secondary | ICD-10-CM | POA: Diagnosis present

## 2023-12-16 DIAGNOSIS — S72002A Fracture of unspecified part of neck of left femur, initial encounter for closed fracture: Principal | ICD-10-CM | POA: Diagnosis present

## 2023-12-16 DIAGNOSIS — I10 Essential (primary) hypertension: Secondary | ICD-10-CM | POA: Diagnosis present

## 2023-12-16 DIAGNOSIS — N182 Chronic kidney disease, stage 2 (mild): Secondary | ICD-10-CM | POA: Diagnosis present

## 2023-12-16 DIAGNOSIS — Z6826 Body mass index (BMI) 26.0-26.9, adult: Secondary | ICD-10-CM

## 2023-12-16 DIAGNOSIS — Z8379 Family history of other diseases of the digestive system: Secondary | ICD-10-CM

## 2023-12-16 DIAGNOSIS — M25552 Pain in left hip: Secondary | ICD-10-CM | POA: Diagnosis not present

## 2023-12-16 DIAGNOSIS — Z8419 Family history of other disorders of kidney and ureter: Secondary | ICD-10-CM

## 2023-12-16 DIAGNOSIS — F418 Other specified anxiety disorders: Secondary | ICD-10-CM | POA: Diagnosis present

## 2023-12-16 DIAGNOSIS — F419 Anxiety disorder, unspecified: Secondary | ICD-10-CM | POA: Diagnosis present

## 2023-12-16 DIAGNOSIS — N179 Acute kidney failure, unspecified: Secondary | ICD-10-CM | POA: Diagnosis present

## 2023-12-16 DIAGNOSIS — S72142A Displaced intertrochanteric fracture of left femur, initial encounter for closed fracture: Principal | ICD-10-CM | POA: Diagnosis present

## 2023-12-16 DIAGNOSIS — M84459A Pathological fracture, hip, unspecified, initial encounter for fracture: Secondary | ICD-10-CM | POA: Diagnosis not present

## 2023-12-16 DIAGNOSIS — D72829 Elevated white blood cell count, unspecified: Secondary | ICD-10-CM | POA: Diagnosis not present

## 2023-12-16 DIAGNOSIS — K589 Irritable bowel syndrome without diarrhea: Secondary | ICD-10-CM | POA: Diagnosis present

## 2023-12-16 DIAGNOSIS — R350 Frequency of micturition: Secondary | ICD-10-CM | POA: Diagnosis present

## 2023-12-16 DIAGNOSIS — K219 Gastro-esophageal reflux disease without esophagitis: Secondary | ICD-10-CM | POA: Diagnosis present

## 2023-12-16 DIAGNOSIS — H9190 Unspecified hearing loss, unspecified ear: Secondary | ICD-10-CM | POA: Diagnosis present

## 2023-12-16 DIAGNOSIS — Z7982 Long term (current) use of aspirin: Secondary | ICD-10-CM

## 2023-12-16 DIAGNOSIS — Z604 Social exclusion and rejection: Secondary | ICD-10-CM | POA: Diagnosis present

## 2023-12-16 DIAGNOSIS — E1165 Type 2 diabetes mellitus with hyperglycemia: Secondary | ICD-10-CM | POA: Diagnosis present

## 2023-12-16 DIAGNOSIS — Z79891 Long term (current) use of opiate analgesic: Secondary | ICD-10-CM

## 2023-12-16 DIAGNOSIS — E872 Acidosis, unspecified: Secondary | ICD-10-CM | POA: Diagnosis present

## 2023-12-16 DIAGNOSIS — I129 Hypertensive chronic kidney disease with stage 1 through stage 4 chronic kidney disease, or unspecified chronic kidney disease: Secondary | ICD-10-CM | POA: Diagnosis not present

## 2023-12-16 DIAGNOSIS — Y92009 Unspecified place in unspecified non-institutional (private) residence as the place of occurrence of the external cause: Secondary | ICD-10-CM

## 2023-12-16 DIAGNOSIS — I2510 Atherosclerotic heart disease of native coronary artery without angina pectoris: Secondary | ICD-10-CM | POA: Diagnosis present

## 2023-12-16 DIAGNOSIS — G8929 Other chronic pain: Secondary | ICD-10-CM | POA: Diagnosis present

## 2023-12-16 DIAGNOSIS — Z87891 Personal history of nicotine dependence: Secondary | ICD-10-CM | POA: Diagnosis not present

## 2023-12-16 DIAGNOSIS — W19XXXA Unspecified fall, initial encounter: Secondary | ICD-10-CM

## 2023-12-16 DIAGNOSIS — S72122A Displaced fracture of lesser trochanter of left femur, initial encounter for closed fracture: Secondary | ICD-10-CM | POA: Diagnosis not present

## 2023-12-16 DIAGNOSIS — Z880 Allergy status to penicillin: Secondary | ICD-10-CM

## 2023-12-16 DIAGNOSIS — Z91041 Radiographic dye allergy status: Secondary | ICD-10-CM

## 2023-12-16 DIAGNOSIS — E1129 Type 2 diabetes mellitus with other diabetic kidney complication: Secondary | ICD-10-CM | POA: Diagnosis not present

## 2023-12-16 DIAGNOSIS — Z043 Encounter for examination and observation following other accident: Secondary | ICD-10-CM | POA: Diagnosis not present

## 2023-12-16 DIAGNOSIS — Z7983 Long term (current) use of bisphosphonates: Secondary | ICD-10-CM

## 2023-12-16 DIAGNOSIS — E663 Overweight: Secondary | ICD-10-CM | POA: Diagnosis present

## 2023-12-16 DIAGNOSIS — Z7401 Bed confinement status: Secondary | ICD-10-CM | POA: Diagnosis not present

## 2023-12-16 DIAGNOSIS — E785 Hyperlipidemia, unspecified: Secondary | ICD-10-CM | POA: Diagnosis present

## 2023-12-16 DIAGNOSIS — Z886 Allergy status to analgesic agent status: Secondary | ICD-10-CM

## 2023-12-16 DIAGNOSIS — F17210 Nicotine dependence, cigarettes, uncomplicated: Secondary | ICD-10-CM | POA: Diagnosis present

## 2023-12-16 DIAGNOSIS — Z833 Family history of diabetes mellitus: Secondary | ICD-10-CM

## 2023-12-16 LAB — COMPREHENSIVE METABOLIC PANEL WITH GFR
ALT: 14 U/L (ref 0–44)
AST: 24 U/L (ref 15–41)
Albumin: 3.9 g/dL (ref 3.5–5.0)
Alkaline Phosphatase: 74 U/L (ref 38–126)
Anion gap: 13 (ref 5–15)
BUN: 19 mg/dL (ref 8–23)
CO2: 19 mmol/L — ABNORMAL LOW (ref 22–32)
Calcium: 8.6 mg/dL — ABNORMAL LOW (ref 8.9–10.3)
Chloride: 106 mmol/L (ref 98–111)
Creatinine, Ser: 1.22 mg/dL — ABNORMAL HIGH (ref 0.44–1.00)
GFR, Estimated: 47 mL/min — ABNORMAL LOW (ref >60)
Glucose, Bld: 132 mg/dL — ABNORMAL HIGH (ref 70–99)
Potassium: 4.4 mmol/L (ref 3.5–5.1)
Sodium: 138 mmol/L (ref 135–145)
Total Bilirubin: 0.2 mg/dL (ref 0.0–1.2)
Total Protein: 6.8 g/dL (ref 6.5–8.1)

## 2023-12-16 LAB — CBC WITH DIFFERENTIAL/PLATELET
Abs Immature Granulocytes: 0.12 K/uL — ABNORMAL HIGH (ref 0.00–0.07)
Basophils Absolute: 0.1 K/uL (ref 0.0–0.1)
Basophils Relative: 1 %
Eosinophils Absolute: 0.3 K/uL (ref 0.0–0.5)
Eosinophils Relative: 3 %
HCT: 34.7 % — ABNORMAL LOW (ref 36.0–46.0)
Hemoglobin: 11.3 g/dL — ABNORMAL LOW (ref 12.0–15.0)
Immature Granulocytes: 1 %
Lymphocytes Relative: 23 %
Lymphs Abs: 3.2 K/uL (ref 0.7–4.0)
MCH: 29.8 pg (ref 26.0–34.0)
MCHC: 32.6 g/dL (ref 30.0–36.0)
MCV: 91.6 fL (ref 80.0–100.0)
Monocytes Absolute: 0.6 K/uL (ref 0.1–1.0)
Monocytes Relative: 5 %
Neutro Abs: 9.4 K/uL — ABNORMAL HIGH (ref 1.7–7.7)
Neutrophils Relative %: 67 %
Platelets: 324 K/uL (ref 150–400)
RBC: 3.79 MIL/uL — ABNORMAL LOW (ref 3.87–5.11)
RDW: 13.1 % (ref 11.5–15.5)
WBC: 13.7 K/uL — ABNORMAL HIGH (ref 4.0–10.5)
nRBC: 0 % (ref 0.0–0.2)

## 2023-12-16 LAB — APTT: aPTT: 31 s (ref 24–36)

## 2023-12-16 LAB — TYPE AND SCREEN
ABO/RH(D): O POS
Antibody Screen: NEGATIVE

## 2023-12-16 LAB — LACTIC ACID, PLASMA: Lactic Acid, Venous: 2.5 mmol/L (ref 0.5–1.9)

## 2023-12-16 LAB — GLUCOSE, CAPILLARY: Glucose-Capillary: 160 mg/dL — ABNORMAL HIGH (ref 70–99)

## 2023-12-16 LAB — PROTIME-INR
INR: 1 (ref 0.8–1.2)
Prothrombin Time: 13.2 s (ref 11.4–15.2)

## 2023-12-16 MED ORDER — SODIUM CHLORIDE 0.9 % IV BOLUS
500.0000 mL | Freq: Once | INTRAVENOUS | Status: AC
  Administered 2023-12-16: 500 mL via INTRAVENOUS

## 2023-12-16 MED ORDER — SODIUM CHLORIDE 0.9 % IV SOLN: INTRAVENOUS | Status: DC

## 2023-12-16 MED ORDER — FENTANYL CITRATE (PF) 50 MCG/ML IJ SOSY
25.0000 ug | PREFILLED_SYRINGE | Freq: Once | INTRAMUSCULAR | Status: AC
  Administered 2023-12-16: 25 ug via INTRAVENOUS
  Filled 2023-12-16: qty 1

## 2023-12-16 MED ORDER — TRANEXAMIC ACID-NACL 1000-0.7 MG/100ML-% IV SOLN
1000.0000 mg | INTRAVENOUS | Status: AC
  Administered 2023-12-17: 1000 mg via INTRAVENOUS
  Filled 2023-12-16: qty 100

## 2023-12-16 MED ORDER — ACETAMINOPHEN 325 MG PO TABS
650.0000 mg | ORAL_TABLET | Freq: Four times a day (QID) | ORAL | Status: DC | PRN
  Administered 2023-12-18 – 2023-12-19 (×2): 650 mg via ORAL
  Filled 2023-12-16 (×2): qty 2

## 2023-12-16 MED ORDER — SENNOSIDES-DOCUSATE SODIUM 8.6-50 MG PO TABS: 1.0000 | ORAL_TABLET | Freq: Every evening | ORAL | Status: DC | PRN

## 2023-12-16 MED ORDER — MORPHINE SULFATE (PF) 2 MG/ML IV SOLN
2.0000 mg | Freq: Once | INTRAVENOUS | Status: AC | PRN
  Administered 2023-12-16: 2 mg via INTRAVENOUS
  Filled 2023-12-16: qty 1

## 2023-12-16 MED ORDER — CHLORHEXIDINE GLUCONATE CLOTH 2 % EX PADS
6.0000 | MEDICATED_PAD | Freq: Every day | CUTANEOUS | Status: DC
  Administered 2023-12-16 – 2023-12-17 (×2): 6 via TOPICAL
  Filled 2023-12-16: qty 6

## 2023-12-16 MED ORDER — PANTOPRAZOLE SODIUM 40 MG PO TBEC
40.0000 mg | DELAYED_RELEASE_TABLET | Freq: Two times a day (BID) | ORAL | Status: DC
  Administered 2023-12-16 – 2023-12-21 (×10): 40 mg via ORAL
  Filled 2023-12-16 (×10): qty 1

## 2023-12-16 MED ORDER — ONDANSETRON HCL 4 MG/2ML IJ SOLN
4.0000 mg | Freq: Three times a day (TID) | INTRAMUSCULAR | Status: DC | PRN
  Administered 2023-12-17 – 2023-12-20 (×2): 4 mg via INTRAVENOUS
  Filled 2023-12-16 (×2): qty 2

## 2023-12-16 MED ORDER — MORPHINE SULFATE (PF) 2 MG/ML IV SOLN
2.0000 mg | Freq: Once | INTRAVENOUS | Status: AC
  Administered 2023-12-16: 2 mg via INTRAVENOUS
  Filled 2023-12-16: qty 1

## 2023-12-16 MED ORDER — OXYBUTYNIN CHLORIDE 5 MG PO TABS
5.0000 mg | ORAL_TABLET | Freq: Four times a day (QID) | ORAL | Status: DC
  Administered 2023-12-16 – 2023-12-21 (×17): 5 mg via ORAL
  Filled 2023-12-16 (×20): qty 1

## 2023-12-16 MED ORDER — ATORVASTATIN CALCIUM 20 MG PO TABS
40.0000 mg | ORAL_TABLET | Freq: Every day | ORAL | Status: DC
  Administered 2023-12-17 – 2023-12-21 (×5): 40 mg via ORAL
  Filled 2023-12-16 (×5): qty 2

## 2023-12-16 MED ORDER — LOSARTAN POTASSIUM 25 MG PO TABS
25.0000 mg | ORAL_TABLET | Freq: Every day | ORAL | Status: DC
  Administered 2023-12-17 – 2023-12-21 (×5): 25 mg via ORAL
  Filled 2023-12-16 (×5): qty 1

## 2023-12-16 MED ORDER — LIDOCAINE 5 % EX PTCH
1.0000 | MEDICATED_PATCH | CUTANEOUS | Status: DC
  Administered 2023-12-16 – 2023-12-18 (×3): 1 via TRANSDERMAL
  Filled 2023-12-16 (×4): qty 1

## 2023-12-16 MED ORDER — MORPHINE SULFATE (PF) 2 MG/ML IV SOLN: 2.0000 mg | INTRAVENOUS | Status: DC | PRN

## 2023-12-16 MED ORDER — SODIUM CHLORIDE 0.9 % IV BOLUS
1000.0000 mL | Freq: Once | INTRAVENOUS | Status: AC
Start: 2023-12-16 — End: 2023-12-16
  Administered 2023-12-16: 1000 mL via INTRAVENOUS

## 2023-12-16 MED ORDER — FLUOXETINE HCL 20 MG PO CAPS
20.0000 mg | ORAL_CAPSULE | Freq: Every day | ORAL | Status: DC
  Administered 2023-12-17 – 2023-12-21 (×5): 20 mg via ORAL
  Filled 2023-12-16 (×5): qty 1

## 2023-12-16 MED ORDER — MORPHINE SULFATE (PF) 2 MG/ML IV SOLN
2.0000 mg | INTRAVENOUS | Status: DC | PRN
  Administered 2023-12-16 – 2023-12-19 (×7): 2 mg via INTRAVENOUS
  Filled 2023-12-16 (×7): qty 1

## 2023-12-16 MED ORDER — INSULIN ASPART 100 UNIT/ML IJ SOLN: 0.0000 [IU] | Freq: Three times a day (TID) | INTRAMUSCULAR | Status: DC

## 2023-12-16 MED ORDER — INSULIN ASPART 100 UNIT/ML IJ SOLN: 0.0000 [IU] | Freq: Every day | INTRAMUSCULAR | Status: DC

## 2023-12-16 MED ORDER — FLUTICASONE PROPIONATE 50 MCG/ACT NA SUSP: 2.0000 | Freq: Every day | NASAL | Status: DC | PRN

## 2023-12-16 MED ORDER — CEFAZOLIN SODIUM-DEXTROSE 2-4 GM/100ML-% IV SOLN
2.0000 g | INTRAVENOUS | Status: AC
  Administered 2023-12-17: 2 g via INTRAVENOUS

## 2023-12-16 MED ORDER — FERROUS SULFATE 325 (65 FE) MG PO TABS
325.0000 mg | ORAL_TABLET | Freq: Every day | ORAL | Status: DC
  Administered 2023-12-17 – 2023-12-21 (×5): 325 mg via ORAL
  Filled 2023-12-16 (×5): qty 1

## 2023-12-16 MED ORDER — HYDRALAZINE HCL 20 MG/ML IJ SOLN: 5.0000 mg | INTRAMUSCULAR | Status: DC | PRN

## 2023-12-16 MED ORDER — HYDROCODONE-ACETAMINOPHEN 5-325 MG PO TABS
1.0000 | ORAL_TABLET | ORAL | Status: DC | PRN
  Administered 2023-12-18 – 2023-12-19 (×5): 1 via ORAL
  Filled 2023-12-16 (×5): qty 1

## 2023-12-16 MED ORDER — CYCLOBENZAPRINE HCL 10 MG PO TABS
5.0000 mg | ORAL_TABLET | Freq: Three times a day (TID) | ORAL | Status: DC
  Administered 2023-12-16: 5 mg via ORAL
  Filled 2023-12-16: qty 1

## 2023-12-16 NOTE — ED Notes (Signed)
 Patient transported to floor with NT

## 2023-12-16 NOTE — Progress Notes (Signed)
 Consultation received for left hip pain from Emergency Department. Xray and CT imaging of the left hip demonstrate intertrochanteric femur fracture.   Plan for left intertrochanteric femur intramedullary nail tomorrow, 11/28. NPO midnight Full consultation to follow

## 2023-12-16 NOTE — ED Provider Notes (Signed)
 [facility] Provider Note    Event Date/Time   First MD Initiated Contact with Patient 12/16/23 1537     (approximate)   History   Fall   HPI  Rhonda Fields is a 73 year old female with history of T2DM, HTN presenting to the emergency department for evaluation after a fall.  Patient was in the bathroom at [facility] when she got a cramp in her leg causing her to fall.  Initially denied hitting head in triage, tells me she is unsure if she hit her head.  Did report significant hip pain with difficulty moving her hip.  Received fentanyl  with EMS as well as Zofran .     Physical Exam   Triage Vital Signs: ED Triage Vitals  Encounter Vitals Group     BP 12/16/23 1545 (!) 162/74     Girls Systolic BP Percentile --      Girls Diastolic BP Percentile --      Boys Systolic BP Percentile --      Boys Diastolic BP Percentile --      Pulse Rate 12/16/23 1545 92     Resp 12/16/23 1545 16     Temp 12/16/23 1545 98.3 F (36.8 C)     Temp Source 12/16/23 1545 Oral     SpO2 12/16/23 1545 93 %     Weight 12/16/23 1546 137 lb (62.1 kg)     Height 12/16/23 1546 5' 2 (1.575 m)     Head Circumference --      Peak Flow --      Pain Score 12/16/23 1545 7     Pain Loc --      Pain Education --      Exclude from Growth Chart --     Most recent vital signs: Vitals:   12/16/23 1545 12/16/23 1852  BP: (!) 162/74 (!) 167/85  Pulse: 92 94  Resp: 16 20  Temp: 98.3 F (36.8 C)   SpO2: 93% 94%    Nursing notes and vital signs reviewed.  General: Adult female, lying in bed, awake interactive Head: Atraumatic Chest: Symmetric chest rise, no tenderness to palpation.  Cardiac: Regular rhythm and rate.  Respiratory: Lungs clear to auscultation Abdomen: Soft, nondistended. No tenderness to palpation.  Pelvis: Stable in AP and lateral compression.  MSK: There is tenderness to palpation along the left proximal femur with limited range of motion secondary to pain.   Even to fully range right lower and bilateral upper extremities.  No tenderness palpation along the distal left lower extremity. Neuro: Alert, oriented. GCS 15. Skin: No evidence of burns or lacerations.   ED Results / Procedures / Treatments   Labs (all labs ordered are listed, but only abnormal results are displayed) Labs Reviewed  CBC WITH DIFFERENTIAL/PLATELET - Abnormal; Notable for the following components:      Result Value   WBC 13.7 (*)    RBC 3.79 (*)    Hemoglobin 11.3 (*)    HCT 34.7 (*)    Neutro Abs 9.4 (*)    Abs Immature Granulocytes 0.12 (*)    All other components within normal limits  COMPREHENSIVE METABOLIC PANEL WITH GFR - Abnormal; Notable for the following components:   CO2 19 (*)    Glucose, Bld 132 (*)    Creatinine, Ser 1.22 (*)    Calcium  8.6 (*)    GFR, Estimated 47 (*)    All other components within normal limits  PROTIME-INR  APTT  TYPE AND  SCREEN     EKG EKG independently reviewed and interpreted by myself demonstrates:    RADIOLOGY Imaging independently reviewed and interpreted by myself demonstrates:  CT head without acute bleed CT C-spine without acute fracture Hip x-Bentleigh Waren with questionable femoral neck fracture  Formal Radiology Read:  CT Hip Left Wo Contrast Result Date: 12/16/2023 EXAM: CT OF THE LEFT HIP WITHOUT IV CONTRAST 12/16/2023 06:28:04 PM TECHNIQUE: CT of the left hip was performed without the administration of intravenous contrast. Multiplanar reformatted images are provided for review. Automated exposure control, iterative reconstruction, and/or weight based adjustment of the mA/kV was utilized to reduce the radiation dose to as low as reasonably achievable. COMPARISON: Comparison with left hip radiographs 12/16/2023. CLINICAL HISTORY: Hip trauma, fracture suspected, xray done. FINDINGS: BONES: Comminuted intertrochanteric fractures of the femur left hip with mildly displaced lesser trochanteric fragment and mild impaction of  fracture fragments. No dislocation at the hip joint. Visualized pubic rami and left hemipelvis appear intact. No aggressive appearing osseous abnormality or periostitis. SOFT TISSUE: No significant soft tissue edema or fluid collections. No soft tissue mass. JOINT: No significant degenerative changes. No osseous erosions. INTRAPELVIC CONTENTS: Limited images of the intrapelvic contents are unremarkable. IMPRESSION: 1. Comminuted intertrochanteric fractures of the left femur with mildly displaced lesser trochanteric fragment and mild impaction of fracture fragments. No dislocation at the hip joint. Electronically signed by: Elsie Gravely MD 12/16/2023 06:33 PM EST RP Workstation: [id number]   DG Hip Unilat With Pelvis 2-3 Views Left Result Date: 12/16/2023 EXAM: 2 or 3 VIEW(S) XRAY OF THE LEFT HIP 12/16/2023 05:19:00 PM COMPARISON: None available. CLINICAL HISTORY: Pain after a fall. FINDINGS: BONES AND JOINTS: The left hip is somewhat rotated. Skin folds overlying limit examination, but there appears to be a persistent linear lucency along the base of the Femoral Neck and extending to the Greater Trochanteric Region with a slight cortical step-off. This suggests a nondisplaced fracture. CT is suggested for correlation and confirmation. The hip joint is maintained. No significant degenerative changes. Contralateral right hip demonstrates postoperative fixation, incompletely visualized. The pelvis and sacrum appear intact. The Sacroiliac Joints and Symphysis Pubis are not displaced. SOFT TISSUES: Skin folds overlying limit examination. Vascular calcifications. IMPRESSION: 1. Possible nondisplaced fracture of the left femoral neck extending to the greater trochanteric region with slight cortical step-off. Recommend CT for confirmation. 2. Postoperative fixation in the contralateral right hip, incompletely visualized. Electronically signed by: Elsie Gravely MD 12/16/2023 05:29 PM EST RP Workstation: [id number]    CT Head Wo Contrast Result Date: 12/16/2023 CLINICAL DATA:  Fall. EXAM: CT HEAD WITHOUT CONTRAST CT CERVICAL SPINE WITHOUT CONTRAST TECHNIQUE: Multidetector CT imaging of the head and cervical spine was performed following the standard protocol without intravenous contrast. Multiplanar CT image reconstructions of the cervical spine were also generated. RADIATION DOSE REDUCTION: This exam was performed according to the departmental dose-optimization program which includes automated exposure control, adjustment of the mA and/or kV according to patient size and/or use of iterative reconstruction technique. COMPARISON:  Head and cervical CT, 09/22/2022 FINDINGS: CT HEAD FINDINGS Brain: No evidence of acute infarction, hemorrhage, hydrocephalus, extra-axial collection or mass lesion/mass effect. Vascular: No hyperdense vessel or unexpected calcification. Skull: Normal. Negative for fracture or focal lesion. Sinuses/Orbits: Globes and orbits are unremarkable. Sinuses are essentially clear. Other: None. CT CERVICAL SPINE FINDINGS Alignment: Slight degenerative anterolisthesis of C4 on C5. No other malalignment. Skull base and vertebrae: No acute fracture. No primary bone lesion or focal pathologic process. Soft tissues and spinal canal: No  prevertebral fluid or swelling. No visible canal hematoma. Disc levels: Mild loss of disc height at C5-C6 with moderate loss of disc height at C6-C7. Facet degenerative change noted bilaterally most evident along the mid cervical spine. No significant disc bulging or convincing disc herniation. Upper chest: No acute findings. Other: None. IMPRESSION: HEAD CT 1. No acute intracranial abnormalities. CERVICAL CT 1. No fracture or acute finding. Electronically Signed   By: Alm Parkins M.D.   On: 12/16/2023 16:59   CT Cervical Spine Wo Contrast Result Date: 12/16/2023 CLINICAL DATA:  Fall. EXAM: CT HEAD WITHOUT CONTRAST CT CERVICAL SPINE WITHOUT CONTRAST TECHNIQUE: Multidetector  CT imaging of the head and cervical spine was performed following the standard protocol without intravenous contrast. Multiplanar CT image reconstructions of the cervical spine were also generated. RADIATION DOSE REDUCTION: This exam was performed according to the departmental dose-optimization program which includes automated exposure control, adjustment of the mA and/or kV according to patient size and/or use of iterative reconstruction technique. COMPARISON:  Head and cervical CT, 09/22/2022 FINDINGS: CT HEAD FINDINGS Brain: No evidence of acute infarction, hemorrhage, hydrocephalus, extra-axial collection or mass lesion/mass effect. Vascular: No hyperdense vessel or unexpected calcification. Skull: Normal. Negative for fracture or focal lesion. Sinuses/Orbits: Globes and orbits are unremarkable. Sinuses are essentially clear. Other: None. CT CERVICAL SPINE FINDINGS Alignment: Slight degenerative anterolisthesis of C4 on C5. No other malalignment. Skull base and vertebrae: No acute fracture. No primary bone lesion or focal pathologic process. Soft tissues and spinal canal: No prevertebral fluid or swelling. No visible canal hematoma. Disc levels: Mild loss of disc height at C5-C6 with moderate loss of disc height at C6-C7. Facet degenerative change noted bilaterally most evident along the mid cervical spine. No significant disc bulging or convincing disc herniation. Upper chest: No acute findings. Other: None. IMPRESSION: HEAD CT 1. No acute intracranial abnormalities. CERVICAL CT 1. No fracture or acute finding. Electronically Signed   By: Alm Parkins M.D.   On: 12/16/2023 16:59    PROCEDURES:  Critical Care performed: No  Procedures   MEDICATIONS ORDERED IN ED: Medications  morphine  (PF) 2 MG/ML injection 2 mg (has no administration in time range)  Chlorhexidine  Gluconate Cloth 2 % PADS 6 each (has no administration in time range)  tranexamic acid  (CYKLOKAPRON ) IVPB 1,000 mg (has no  administration in time range)  ceFAZolin  (ANCEF ) IVPB 2g/100 mL premix (has no administration in time range)  morphine  (PF) 2 MG/ML injection 2 mg (has no administration in time range)  HYDROcodone -acetaminophen  (NORCO/VICODIN) 5-325 MG per tablet 1 tablet (has no administration in time range)  cyclobenzaprine  (FLEXERIL ) tablet 5 mg (has no administration in time range)  lidocaine  (LIDODERM ) 5 % 1 patch (has no administration in time range)  ondansetron  (ZOFRAN ) injection 4 mg (has no administration in time range)  hydrALAZINE  (APRESOLINE ) injection 5 mg (has no administration in time range)  acetaminophen  (TYLENOL ) tablet 650 mg (has no administration in time range)  insulin  aspart (novoLOG ) injection 0-5 Units (has no administration in time range)  insulin  aspart (novoLOG ) injection 0-9 Units (has no administration in time range)  morphine  (PF) 2 MG/ML injection 2 mg (2 mg Intravenous Given 12/16/23 1757)  morphine  (PF) 2 MG/ML injection 2 mg (2 mg Intravenous Given 12/16/23 1853)  sodium chloride  0.9 % bolus 1,000 mL (1,000 mLs Intravenous New Bag/Given 12/16/23 1810)     IMPRESSION / MDM / ASSESSMENT AND PLAN / ED COURSE  I reviewed the triage vital signs and the nursing notes.  Differential diagnosis includes, but is not limited to, intracranial bleed, spine fracture, no evidence of thoracoabdominal trauma, left hip fracture, dislocation, soft tissue injury, electrolyte abnormality, dehydration  Patient's presentation is most consistent with acute presentation with potential threat to life or bodily function.  73 year old female presenting to the emergency department for evaluation after a fall in the setting of cramping.  Stable vitals on presentation.  Labs with mild leukocytosis, stable anemia.  CMP with mild AKI.  INR normal.  Ordered for CT head, C-spine, hip x-Mauricio Dahlen.  Ordered for morphine  and fluids.  Clinical Course as of 12/16/23 1953  Thu Dec 16, 2023  1751 DG Hip Unilat With  Pelvis 2-3 Views Left CT head and C-spine negative.  Hip x-[facility] Crutcher with questionable femoral neck fracture with radiology recommendation for CT.  CT ordered to further evaluate. [NR]  1848 CT does demonstrate a left intertrochanteric fracture.  Case reviewed with Dr. Gust with orthopedics.  Recommends hospitalist admission for likely operative intervention tomorrow. [NR]  1908 Reviewed results of workup with patient.  Agreeable with plan for admission.  Will reach out to hospitalist team. [NR]  1951 Case discussed with hospitalist team. They will evaluate for anticipated admission.  [NR]    Clinical Course User Index [NR] Levander Slate, MD     FINAL CLINICAL IMPRESSION(S) / ED DIAGNOSES   Final diagnoses:  Closed fracture of left hip, initial encounter [facility])     Rx / DC Orders   ED Discharge Orders     None        Note:  This document was prepared using Dragon voice recognition software and may include unintentional dictation errors.   Levander Slate, MD 12/16/23 [id number]

## 2023-12-16 NOTE — H&P (Addendum)
 History and Physical    Rhonda Fields FMW:[id number] DOB: 1950-02-06 DOA: 12/16/2023  Referring MD/NP/PA:   PCP: Duanne Butler DASEN, MD   Patient coming from:  The patient is coming from home.     Chief Complaint: fall and left hip pain  HPI: Rhonda Fields is a 73 y.o. female with medical history significant of HTN, HLD, DM, depression with anxiety, CKD-2, IBS, hard of hearing, headache, deficiency anemia, who presents with fall, left hip pain.  Patient states that she had leg cramps when she was in gas station bathroom, and fell, injured her left hip, causing pain in left hip.  No LOC.  She is not sure if she injured her head or not.  Denies headache or neck pain.  Her left hip pain is constant, severe, sharp, nonradiating, aggravated by movement. Patient does not have chest pain, cough, SOB.  She has nausea, no vomiting, diarrhea or abdominal pain.  No symptoms of UTI.   Data reviewed independently and ED Course: pt was found to have WBC 13.7, slightly worsening renal function, temperature normal, blood pressure 162/85, heart rate 94, RR 20, oxygen saturation 94% on room air.  CT of head and CT of C-spine negative for acute injury.  X-ray of left hip showed possible left hip fracture which is confirmed by CT scan of left hip.  Dr. Gust of Ortho is consulted.  CT of left hip: 1. Comminuted intertrochanteric fractures of the left femur with mildly displaced lesser trochanteric fragment and mild impaction of fracture fragments. No dislocation at the hip joint.    EKG: Not done in ED, will get one.    Review of Systems:   General: no fevers, chills, no body weight gain, has fatigue HEENT: no blurry vision or sore throat Respiratory: no dyspnea, coughing, wheezing CV: no chest pain, no palpitations GI: no nausea, vomiting, abdominal pain, diarrhea, constipation GU: no dysuria, burning on urination, increased urinary frequency, hematuria  Ext: no leg  edema Neuro: no unilateral weakness, numbness, or tingling, no vision change. Has fall Skin: no rash, no skin tear. MSK: has left hip pain Heme: No easy bruising.  Travel history: No recent long distant travel.   Allergy:  Allergies  Allergen Reactions   Ivp Dye [Iodinated Contrast Media] Itching and Nausea And Vomiting   Mobic [Meloxicam] Other (See Comments)    Lower extremity swelling   Penicillins Hives and Itching    Past Medical History:  Diagnosis Date   Anxiety    Arrhythmia    Chronic headaches    Conjunctivitis 10/31/2012   Depression    Diabetes mellitus without complication (HCC)    Generalized abdominal pain 04/29/2011   Generalized headaches 04/08/2011   GERD (gastroesophageal reflux disease)    Hard of hearing    IBS (irritable bowel syndrome) 01/20/1984    Past Surgical History:  Procedure Laterality Date   ABDOMINAL HYSTERECTOMY     CHOLECYSTECTOMY     ERCP  04/20/2011   Procedure: ENDOSCOPIC RETROGRADE CHOLANGIOPANCREATOGRAPHY (ERCP);  Surgeon: Lamar JONETTA Aho, MD;  Location: [facility];  Service: Endoscopy;  Laterality: N/A;  case is at 1430 in or   ERCP  04/20/2011   Procedure: ENDOSCOPIC RETROGRADE CHOLANGIOPANCREATOGRAPHY (ERCP);  Surgeon: Lamar JONETTA Aho, MD;  Location: [facility];  Service: Gastroenterology;  Laterality: N/A;   INTRAMEDULLARY (IM) NAIL INTERTROCHANTERIC Right 09/23/2022   Procedure: INTRAMEDULLARY (IM) NAIL INTERTROCHANTERIC;  Surgeon: Jerri Kay HERO, MD;  Location: [facility];  Service: Orthopedics;  Laterality: Right;  VENTRAL HERNIA REPAIR N/A 10/15/2022   Procedure: HERNIA REPAIR VENTRAL ADULT;  Surgeon: Marinda Jayson KIDD, MD;  Location: [facility];  Service: General;  Laterality: N/A;    Social History:  reports that she has quit smoking. Her smoking use included cigarettes. She has a 40 pack-year smoking history. She has never used smokeless tobacco. She reports that she does not drink alcohol and does not use drugs.  Family History:   Family History  Adopted: Yes  Problem Relation Age of Onset   Diabetes Other        Siblings, both sets of grandparents   Liver disease Mother    Kidney disease Mother    Liver disease Brother    Malignant hyperthermia Neg Hx      Prior to Admission medications   Medication Sig Start Date End Date Taking? Authorizing Provider  Alcohol Swabs  (DROPSAFE ALCOHOL PREP) 70 % PADS USE AS DIRECTED PRIOR TO MONITORING BLOOD GLUCOSE UP TO THREE TIMES DAILY 01/15/22   Duanne Butler DASEN, MD  alendronate  (FOSAMAX ) 70 MG tablet TAKE 1 TABLET EVERY 7 DAYS WITH A FULL GLASS OF WATER ON AN EMPTY STOMACH 06/10/23   Duanne Butler DASEN, MD  Ascorbic Acid (VITAMIN C PO) Take 1 tablet by mouth daily.    [provider]  aspirin  EC 81 MG tablet Take 81 mg by mouth daily.    [provider]  atorvastatin  (LIPITOR) 40 MG tablet TAKE 1 TABLET EVERY DAY 10/20/23   Duanne Butler DASEN, MD  Blood Glucose Calibration (TRUE METRIX LEVEL 1) Low SOLN Use as directed to monitor FSBS 1x daily. Dx: E11.9 02/19/20   Duanne Butler DASEN, MD  Blood Glucose Monitoring Suppl (TRUE METRIX METER) w/Device KIT USE AS DIRECTED 04/25/20   Duanne Butler DASEN, MD  Calcium  Carb-Cholecalciferol (CALCIUM  + VITAMIN D3 PO) Take 1 tablet by mouth daily.    [provider]  cyclobenzaprine  (FLEXERIL ) 5 MG tablet Take 1 tablet (5 mg total) by mouth 3 (three) times daily as needed for muscle spasms. 11/12/22   Duanne Butler DASEN, MD  docusate sodium  (COLACE) 100 MG capsule Take 1 capsule (100 mg total) by mouth 2 (two) times daily as needed for mild constipation. 09/28/22   Caleen Burgess BROCKS, MD  FEROSUL 325 (65 Fe) MG tablet TAKE 1 TABLET EVERY DAY 03/12/23   Duanne Butler DASEN, MD  FLUoxetine  (PROZAC ) 20 MG capsule TAKE 1 CAPSULE EVERY DAY 11/23/23   Duanne Butler DASEN, MD  fluticasone  (FLONASE ) 50 MCG/ACT nasal spray USE 2 SPRAYS IN EACH NOSTRIL EVERY DAY 11/23/23   Duanne Butler DASEN, MD  furosemide  (LASIX ) 40 MG tablet TAKE 1 TABLET  EVERY DAY AS NEEDED FOR EDEMA 07/05/23   Duanne Butler DASEN, MD  glucose blood test strip Use as instructed 02/15/20   Duanne Butler DASEN, MD  HYDROcodone -acetaminophen  (NORCO) 5-325 MG tablet Take 1 tablet by mouth every 6 (six) hours as needed for moderate pain (pain score 4-6). 10/28/23   Duanne Butler DASEN, MD  ibuprofen  (ADVIL ) 600 MG tablet Take 1 tablet (600 mg total) by mouth every 6 (six) hours as needed. Patient not taking: Reported on 10/28/2023 10/20/22   Schulz, Zachary R, PA-C  losartan  (COZAAR ) 25 MG tablet TAKE 1 TABLET EVERY DAY 10/27/22   Duanne Butler DASEN, MD  metFORMIN  (GLUCOPHAGE ) 1000 MG tablet TAKE 1 TABLET TWICE DAILY WITH MEALS 11/01/23   Duanne Butler DASEN, MD  Multiple Vitamins-Minerals (ZINC PO) Take 1 tablet by mouth daily.    [provider]  Omega-3 Fatty Acids (FISH OIL PO) Take 2,000 mg by mouth daily.    [provider]  ondansetron  (ZOFRAN -ODT) 4 MG disintegrating tablet Take 1 tablet (4 mg total) by mouth every 6 (six) hours as needed for nausea. 10/20/22   Schulz, Zachary R, PA-C  oxybutynin  (DITROPAN ) 5 MG tablet TAKE 1 TABLET FOUR TIMES DAILY 06/01/23   Duanne Butler DASEN, MD  pantoprazole  (PROTONIX ) 40 MG tablet TAKE 1 TABLET TWICE DAILY 06/16/23   Duanne Butler DASEN, MD  pioglitazone  (ACTOS ) 30 MG tablet TAKE 1 TABLET EVERY DAY 06/16/23   Duanne Butler DASEN, MD  polyethylene glycol (MIRALAX  / GLYCOLAX ) 17 g packet Take 17 g by mouth daily as needed for moderate constipation or severe constipation. Patient not taking: Reported on 10/28/2023 09/28/22   Caleen Burgess BROCKS, MD  triamcinolone  cream (KENALOG ) 0.1 % Apply topically 2 (two) times daily. Patient taking differently: Apply 1 Application topically 2 (two) times daily as needed (skin irritation, itching). 03/17/22   Duanne Butler DASEN, MD  TRUEplus Lancets 33G MISC Use as directed to monitor FSBS 1x daily. Dx: E11.9 02/15/20   Duanne Butler DASEN, MD    Physical Exam: Vitals:   12/16/23 1852 12/16/23 1955  12/16/23 2031 12/16/23 2059  BP: (!) 167/85 (!) 153/89 (!) 162/80 (!) 153/82  Pulse: 94 96 74 (!) 101  Resp: 20 18 18 16   Temp:  98 F (36.7 C) 98.3 F (36.8 C) 97.8 F (36.6 C)  TempSrc:  Oral Oral   SpO2: 94%  96% 94%  Weight:      Height:       General: Not in acute distress HEENT: has HOH       Eyes: PERRL, EOMI, no jaundice       ENT: No discharge from the ears and nose, no pharynx injection, no tonsillar enlargement.        Neck: No JVD, no bruit, no mass felt. Heme: No neck lymph node enlargement. Cardiac: S1/S2, RRR, has soft 1/5 systolic murmurs, No gallops or rubs. Respiratory: No rales, wheezing, rhonchi or rubs. GI: Soft, nondistended, nontender, no rebound pain, no organomegaly, BS present. GU: No hematuria Ext: No pitting leg edema bilaterally. 1+DP/PT pulse bilaterally. Musculoskeletal: has tenderness over left hip, the left leg is externally rotated. Skin: No rashes.  Neuro: Alert, oriented X3, cranial nerves II-XII grossly intact, moves all extremities.  Psych: Patient is not psychotic, no suicidal or hemocidal ideation.  Labs on Admission: I have personally reviewed following labs and imaging studies  CBC: Recent Labs  Lab 12/16/23 1602  WBC 13.7*  NEUTROABS 9.4*  HGB 11.3*  HCT 34.7*  MCV 91.6  PLT 324   Basic Metabolic Panel: Recent Labs  Lab 12/16/23 1602  NA 138  K 4.4  CL 106  CO2 19*  GLUCOSE 132*  BUN 19  CREATININE 1.22*  CALCIUM  8.6*   GFR: Estimated Creatinine Clearance: 36.1 mL/min (A) (by C-G formula based on SCr of 1.22 mg/dL (H)). Liver Function Tests: Recent Labs  Lab 12/16/23 1602  AST 24  ALT 14  ALKPHOS 74  BILITOT 0.2  PROT 6.8  ALBUMIN 3.9   No results for input(s): LIPASE, AMYLASE in the last 168 hours. No results for input(s): AMMONIA in the last 168 hours. Coagulation Profile: Recent Labs  Lab 12/16/23 1602  INR 1.0   Cardiac Enzymes: No results for input(s): CKTOTAL, CKMB, CKMBINDEX,  TROPONINI in the last 168 hours. BNP (last 3 results) No results for  input(s): PROBNP in the last 8760 hours. HbA1C: No results for input(s): HGBA1C in the last 72 hours. CBG: No results for input(s): GLUCAP in the last 168 hours. Lipid Profile: No results for input(s): CHOL, HDL, LDLCALC, TRIG, CHOLHDL, LDLDIRECT in the last 72 hours. Thyroid Function Tests: No results for input(s): TSH, T4TOTAL, FREET4, T3FREE, THYROIDAB in the last 72 hours. Anemia Panel: No results for input(s): VITAMINB12, FOLATE, FERRITIN, TIBC, IRON , RETICCTPCT in the last 72 hours. Urine analysis:    Component Value Date/Time   COLORURINE STRAW (A) 10/19/2022 0200   APPEARANCEUR CLEAR (A) 10/19/2022 0200   LABSPEC 1.005 10/19/2022 0200   PHURINE 6.0 10/19/2022 0200   GLUCOSEU NEGATIVE 10/19/2022 0200   HGBUR NEGATIVE 10/19/2022 0200   BILIRUBINUR NEGATIVE 10/19/2022 0200   KETONESUR NEGATIVE 10/19/2022 0200   PROTEINUR NEGATIVE 10/19/2022 0200   NITRITE NEGATIVE 10/19/2022 0200   LEUKOCYTESUR NEGATIVE 10/19/2022 0200   Sepsis Labs: @LABRCNTIP (procalcitonin:4,lacticidven:4) )No results found for this or any previous visit (from the past 240 hours).   Radiological Exams on Admission:   Assessment/Plan Principal Problem:   Closed left hip fracture (HCC) Active Problems:   Leukocytosis   Fall at home, initial encounter   HTN (hypertension)   HLD (hyperlipidemia)   Type II diabetes mellitus with renal manifestations (HCC)   Metabolic acidosis   CKD (chronic kidney disease) stage 2, GFR 60-89 ml/min   Depression with anxiety   Assessment and Plan:  Closed left hip fracture [facility]):  CT showed comminuted intertrochanteric fractures of the left femur with mildly displaced lesser trochanteric fragment and mild impaction of fracture fragments. No neurovascular compromise. Orthopedic surgeon, Dr. Gust was consulted.   - will admit to Med-surg bed as inpt -  Pain control: prn morphine , Norco and tyleno - When necessary Zofran  for nausea - prn Robaxin  for muscle spasm - Lidoderm  patch for pain - type and cross - INR/PTT  Leukocytosis: WBC 13.7, no fever, does not have signs of infection, likely reactive. -Follow-up CBC  Fall at home, initial encounter -PT/OT when able to (not ordered now)  HTN (hypertension) -IV hydralazine  as needed - Cozaar   HLD (hyperlipidemia) -Lipitor  Type II diabetes mellitus with renal manifestations (HCC): Recent A1c 6.1, well-controlled.  Patient is taking metformin  -SSI  Metabolic acidosis: Bicarbonate 19, no signs of infection.  - IV fluid: 1 L normal saline in ED, will give another 500 cc NS bolus - NS at 75 cc/h - Check lactic acid level (pt is on metformin )  CKD (chronic kidney disease) stage 2, GFR 60-89 ml/min: Slightly worsening at baseline.  Baseline creatinine 1.04 on 10/27/2023.  Her creatinine is 1.22, BUN 19, GFR 47.  Likely due to dehydration. - IVF as above - Hold Lasix  (as needed at home)  Depression with anxiety - Prozac   Perioperative Cardiac Risk: pt has multiple comorbidities as listed above, no hx of CAD or CHF. Currently patient is active and independent of ADLs and, IADLs. No recent acute cardiac issues.  Patient does not have chest pain, shortness of breath, palpitation, leg edema.  No signs of acute CHF currently. At this time point, no further work up is needed. Patient's GUPTA score perioperative myocardial infarction or cardaic arrest is 0.7 %.  I discussed the risk with patient and her daughter, pt would like to proceed for surgery.    DVT ppx: SCD  Code Status: Full code   Family Communication:   Yes, patient's daughter by phone   Disposition Plan:  to rehab  Consults called: Dr. Lucius for Ortho  Admission status and Level of care: Med-Surg:  as inpt        Dispo: The patient is from: Home              Anticipated d/c is to: SNF              Anticipated d/c date  is: 2 days              Patient currently is not medically stable to d/c.    Severity of Illness:  The appropriate patient status for this patient is INPATIENT. Inpatient status is judged to be reasonable and necessary in order to provide the required intensity of service to ensure the patient's safety. The patient's presenting symptoms, physical exam findings, and initial radiographic and laboratory data in the context of their chronic comorbidities is felt to place them at high risk for further clinical deterioration. Furthermore, it is not anticipated that the patient will be medically stable for discharge from the hospital within 2 midnights of admission.   * I certify that at the point of admission it is my clinical judgment that the patient will require inpatient hospital care spanning beyond 2 midnights from the point of admission due to high intensity of service, high risk for further deterioration and high frequency of surveillance required.*       Date of Service 12/16/2023    Caleb Exon [facility]   If 7PM-7AM, please contact night-coverage [email or web address] 12/16/2023, 9:06 PM

## 2023-12-16 NOTE — ED Triage Notes (Signed)
 BIBEMS from [place], pt states they got a cramp in their leg and fell on the gas station bathroom. Pt denies hitting head, LOC, and blood thinners. Pt originally complained of 10/10 L hip pain, they were unable to straighten leg without pain. Per EMS pt's hip was rotated whenever they were able to straighten leg. Pt received 75 mcg of fentanyl  in route, stated pain was a 7/10 after fentanyl . Pt also received 4mg  of Zofran .

## 2023-12-17 ENCOUNTER — Inpatient Hospital Stay: Admitting: Certified Registered Nurse Anesthetist

## 2023-12-17 ENCOUNTER — Inpatient Hospital Stay

## 2023-12-17 ENCOUNTER — Encounter: Payer: Self-pay | Admitting: Internal Medicine

## 2023-12-17 ENCOUNTER — Encounter
Admission: EM | Disposition: A | Discharge status: Skilled Nursing Facility | Payer: Self-pay | Source: Home / Self Care | Attending: Osteopathic Medicine

## 2023-12-17 DIAGNOSIS — E785 Hyperlipidemia, unspecified: Secondary | ICD-10-CM

## 2023-12-17 DIAGNOSIS — I10 Essential (primary) hypertension: Secondary | ICD-10-CM

## 2023-12-17 DIAGNOSIS — S72142A Displaced intertrochanteric fracture of left femur, initial encounter for closed fracture: Secondary | ICD-10-CM

## 2023-12-17 DIAGNOSIS — E1129 Type 2 diabetes mellitus with other diabetic kidney complication: Secondary | ICD-10-CM

## 2023-12-17 DIAGNOSIS — S72002A Fracture of unspecified part of neck of left femur, initial encounter for closed fracture: Secondary | ICD-10-CM | POA: Diagnosis not present

## 2023-12-17 DIAGNOSIS — F418 Other specified anxiety disorders: Secondary | ICD-10-CM

## 2023-12-17 DIAGNOSIS — Z9889 Other specified postprocedural states: Secondary | ICD-10-CM | POA: Diagnosis not present

## 2023-12-17 HISTORY — PX: INTRAMEDULLARY (IM) NAIL INTERTROCHANTERIC: SHX5875

## 2023-12-17 LAB — BASIC METABOLIC PANEL WITH GFR
Anion gap: 11 (ref 5–15)
BUN: 14 mg/dL (ref 8–23)
CO2: 23 mmol/L (ref 22–32)
Calcium: 8.6 mg/dL — ABNORMAL LOW (ref 8.9–10.3)
Chloride: 103 mmol/L (ref 98–111)
Creatinine, Ser: 0.85 mg/dL (ref 0.44–1.00)
GFR, Estimated: >60 mL/min (ref >60)
Glucose, Bld: 122 mg/dL — ABNORMAL HIGH (ref 70–99)
Potassium: 4.4 mmol/L (ref 3.5–5.1)
Sodium: 138 mmol/L (ref 135–145)

## 2023-12-17 LAB — CBC
HCT: 34 % — ABNORMAL LOW (ref 36.0–46.0)
Hemoglobin: 11.4 g/dL — ABNORMAL LOW (ref 12.0–15.0)
MCH: 29.8 pg (ref 26.0–34.0)
MCHC: 33.5 g/dL (ref 30.0–36.0)
MCV: 88.8 fL (ref 80.0–100.0)
Platelets: 294 K/uL (ref 150–400)
RBC: 3.83 MIL/uL — ABNORMAL LOW (ref 3.87–5.11)
RDW: 12.8 % (ref 11.5–15.5)
WBC: 15.4 K/uL — ABNORMAL HIGH (ref 4.0–10.5)
nRBC: 0 % (ref 0.0–0.2)

## 2023-12-17 LAB — GLUCOSE, CAPILLARY
Glucose-Capillary: 129 mg/dL — ABNORMAL HIGH (ref 70–99)
Glucose-Capillary: 124 mg/dL — ABNORMAL HIGH (ref 70–99)
Glucose-Capillary: 130 mg/dL — ABNORMAL HIGH (ref 70–99)
Glucose-Capillary: 166 mg/dL — ABNORMAL HIGH (ref 70–99)
Glucose-Capillary: 225 mg/dL — ABNORMAL HIGH (ref 70–99)

## 2023-12-17 LAB — HEMOGLOBIN AND HEMATOCRIT, BLOOD
HCT: 28.7 % — ABNORMAL LOW (ref 36.0–46.0)
Hemoglobin: 9.8 g/dL — ABNORMAL LOW (ref 12.0–15.0)

## 2023-12-17 LAB — LACTIC ACID, PLASMA
Lactic Acid, Venous: 1.9 mmol/L (ref 0.5–1.9)
Lactic Acid, Venous: 1.5 mmol/L (ref 0.5–1.9)

## 2023-12-17 SURGERY — FIXATION, FRACTURE, INTERTROCHANTERIC, WITH INTRAMEDULLARY ROD
Anesthesia: General | Laterality: Left

## 2023-12-17 MED ORDER — LIDOCAINE HCL (PF) 1 % IJ SOLN
INTRAMUSCULAR | Status: DC | PRN
  Administered 2023-12-17: 10 mL

## 2023-12-17 MED ORDER — 0.9 % SODIUM CHLORIDE (POUR BTL) OPTIME
TOPICAL | Status: DC | PRN
Start: 2023-12-17 — End: 2023-12-17
  Administered 2023-12-17: 500 mL

## 2023-12-17 MED ORDER — MIDAZOLAM HCL (PF) 2 MG/2ML IJ SOLN
INTRAMUSCULAR | Status: DC | PRN
  Administered 2023-12-17: 1 mg via INTRAVENOUS

## 2023-12-17 MED ORDER — ROCURONIUM BROMIDE 100 MG/10ML IV SOLN
INTRAVENOUS | Status: DC | PRN
  Administered 2023-12-17: 50 mg via INTRAVENOUS

## 2023-12-17 MED ORDER — LIDOCAINE HCL (PF) 1 % IJ SOLN
INTRAMUSCULAR | Status: AC
  Filled 2023-12-17: qty 30

## 2023-12-17 MED ORDER — PROPOFOL 10 MG/ML IV BOLUS
INTRAVENOUS | Status: AC
Start: 2023-12-17 — End: 2023-12-17
  Filled 2023-12-17: qty 20

## 2023-12-17 MED ORDER — PHENYLEPHRINE HCL-NACL 20-0.9 MG/250ML-% IV SOLN
INTRAVENOUS | Status: AC
  Filled 2023-12-17: qty 250

## 2023-12-17 MED ORDER — FENTANYL CITRATE (PF) 100 MCG/2ML IJ SOLN: 25.0000 ug | INTRAMUSCULAR | Status: DC | PRN

## 2023-12-17 MED ORDER — CEFAZOLIN SODIUM-DEXTROSE 2-4 GM/100ML-% IV SOLN
2.0000 g | Freq: Three times a day (TID) | INTRAVENOUS | Status: AC
  Administered 2023-12-17 – 2023-12-18 (×2): 2 g via INTRAVENOUS
  Filled 2023-12-17 (×2): qty 100

## 2023-12-17 MED ORDER — ACETAMINOPHEN 10 MG/ML IV SOLN
INTRAVENOUS | Status: AC
  Filled 2023-12-17: qty 100

## 2023-12-17 MED ORDER — MIDAZOLAM HCL 2 MG/2ML IJ SOLN
INTRAMUSCULAR | Status: AC
  Filled 2023-12-17: qty 2

## 2023-12-17 MED ORDER — DEXAMETHASONE SOD PHOSPHATE PF 10 MG/ML IJ SOLN
INTRAMUSCULAR | Status: DC | PRN
  Administered 2023-12-17: 10 mg via INTRAVENOUS

## 2023-12-17 MED ORDER — PROPOFOL 10 MG/ML IV BOLUS
INTRAVENOUS | Status: DC | PRN
Start: 2023-12-17 — End: 2023-12-17
  Administered 2023-12-17: 150 mg via INTRAVENOUS

## 2023-12-17 MED ORDER — PHENYLEPHRINE 80 MCG/ML (10ML) SYRINGE FOR IV PUSH (FOR BLOOD PRESSURE SUPPORT)
PREFILLED_SYRINGE | INTRAVENOUS | Status: DC | PRN
  Administered 2023-12-17 (×3): 160 ug via INTRAVENOUS
  Administered 2023-12-17: 80 ug via INTRAVENOUS
  Administered 2023-12-17: 160 ug via INTRAVENOUS

## 2023-12-17 MED ORDER — SUCCINYLCHOLINE CHLORIDE 200 MG/10ML IV SOSY
PREFILLED_SYRINGE | INTRAVENOUS | Status: DC | PRN
  Administered 2023-12-17: 100 mg via INTRAVENOUS

## 2023-12-17 MED ORDER — CEFAZOLIN SODIUM-DEXTROSE 2-4 GM/100ML-% IV SOLN
INTRAVENOUS | Status: AC
  Filled 2023-12-17: qty 100

## 2023-12-17 MED ORDER — ZOLPIDEM TARTRATE 5 MG PO TABS
5.0000 mg | ORAL_TABLET | Freq: Every evening | ORAL | Status: DC | PRN
  Administered 2023-12-17: 5 mg via ORAL
  Filled 2023-12-17: qty 1

## 2023-12-17 MED ORDER — PHENYLEPHRINE HCL-NACL 20-0.9 MG/250ML-% IV SOLN
INTRAVENOUS | Status: DC | PRN
  Administered 2023-12-17: 25 ug/min via INTRAVENOUS

## 2023-12-17 MED ORDER — ONDANSETRON HCL 4 MG/2ML IJ SOLN
INTRAMUSCULAR | Status: AC
  Filled 2023-12-17: qty 2

## 2023-12-17 MED ORDER — OXYCODONE HCL 5 MG PO TABS: 5.0000 mg | ORAL_TABLET | Freq: Once | ORAL | Status: DC | PRN

## 2023-12-17 MED ORDER — ACETAMINOPHEN 10 MG/ML IV SOLN
INTRAVENOUS | Status: DC | PRN
  Administered 2023-12-17: 1000 mg via INTRAVENOUS

## 2023-12-17 MED ORDER — POLYETHYLENE GLYCOL 3350 17 G PO PACK
17.0000 g | PACK | Freq: Every day | ORAL | Status: DC
  Administered 2023-12-19 – 2023-12-21 (×3): 17 g via ORAL
  Filled 2023-12-17 (×4): qty 1

## 2023-12-17 MED ORDER — FENTANYL CITRATE (PF) 100 MCG/2ML IJ SOLN
INTRAMUSCULAR | Status: DC | PRN
  Administered 2023-12-17: 100 ug via INTRAVENOUS

## 2023-12-17 MED ORDER — SUGAMMADEX SODIUM 200 MG/2ML IV SOLN
INTRAVENOUS | Status: DC | PRN
  Administered 2023-12-17: 200 mg via INTRAVENOUS

## 2023-12-17 MED ORDER — LIDOCAINE HCL (PF) 2 % IJ SOLN
INTRAMUSCULAR | Status: AC
Start: 2023-12-17 — End: 2023-12-17
  Filled 2023-12-17: qty 5

## 2023-12-17 MED ORDER — HYDROMORPHONE HCL 1 MG/ML IJ SOLN
INTRAMUSCULAR | Status: AC
  Filled 2023-12-17: qty 1

## 2023-12-17 MED ORDER — BISACODYL 10 MG RE SUPP: 10.0000 mg | Freq: Every day | RECTAL | Status: DC | PRN

## 2023-12-17 MED ORDER — METHOCARBAMOL 500 MG PO TABS
500.0000 mg | ORAL_TABLET | Freq: Three times a day (TID) | ORAL | Status: DC | PRN
  Administered 2023-12-17 – 2023-12-19 (×5): 500 mg via ORAL
  Filled 2023-12-17 (×5): qty 1

## 2023-12-17 MED ORDER — TRANEXAMIC ACID-NACL 1000-0.7 MG/100ML-% IV SOLN
INTRAVENOUS | Status: AC
  Filled 2023-12-17: qty 100

## 2023-12-17 MED ORDER — HYDROMORPHONE HCL 1 MG/ML IJ SOLN
INTRAMUSCULAR | Status: DC | PRN
  Administered 2023-12-17: .5 mg via INTRAVENOUS

## 2023-12-17 MED ORDER — FENTANYL CITRATE (PF) 100 MCG/2ML IJ SOLN
INTRAMUSCULAR | Status: AC
  Filled 2023-12-17: qty 2

## 2023-12-17 MED ORDER — ORAL CARE MOUTH RINSE: 15.0000 mL | OROMUCOSAL | Status: DC | PRN

## 2023-12-17 MED ORDER — ONDANSETRON HCL 4 MG/2ML IJ SOLN
INTRAMUSCULAR | Status: DC | PRN
  Administered 2023-12-17: 4 mg via INTRAVENOUS

## 2023-12-17 MED ORDER — OXYCODONE HCL 5 MG/5ML PO SOLN: 5.0000 mg | Freq: Once | ORAL | Status: DC | PRN

## 2023-12-17 MED ORDER — SENNOSIDES-DOCUSATE SODIUM 8.6-50 MG PO TABS
2.0000 | ORAL_TABLET | Freq: Every evening | ORAL | Status: DC | PRN
  Administered 2023-12-17: 2 via ORAL
  Filled 2023-12-17: qty 2

## 2023-12-17 MED ORDER — LIDOCAINE HCL (CARDIAC) PF 100 MG/5ML IV SOSY
PREFILLED_SYRINGE | INTRAVENOUS | Status: DC | PRN
  Administered 2023-12-17: 80 mg via INTRAVENOUS

## 2023-12-17 SURGICAL SUPPLY — 43 items
BIT DRILL CALIBRATED 4.2 (BIT) IMPLANT
BIT DRILL CANN 16 (BIT) IMPLANT
BIT DRILL CANN 16 HIP (BIT) IMPLANT
BIT DRILL CANN HP 16 (BIT) IMPLANT
BIT DRILL CANN STP 6/9 HIP (BIT) IMPLANT
BIT DRILL TAPERED 10 (BIT) IMPLANT
BNDG COHESIVE 6X5 TAN ST LF (GAUZE/BANDAGES/DRESSINGS) ×2 IMPLANT
CHLORAPREP W/TINT 26 (MISCELLANEOUS) ×1 IMPLANT
DRAPE C-ARM XRAY 36X54 (DRAPES) ×1 IMPLANT
DRAPE C-ARMOR (DRAPES) IMPLANT
DRAPE SHEET LG 3/4 BI-LAMINATE (DRAPES) IMPLANT
DRSG AQUACEL AG ADV 3.5X 4 (GAUZE/BANDAGES/DRESSINGS) IMPLANT
DRSG OPSITE POSTOP 4X6 (GAUZE/BANDAGES/DRESSINGS) ×2 IMPLANT
DRSG TEGADERM 4X4.75 (GAUZE/BANDAGES/DRESSINGS) IMPLANT
ELECT CAUTERY BLADE 6.4 (BLADE) IMPLANT
ELECTRODE REM PT RTRN 9FT ADLT (ELECTROSURGICAL) IMPLANT
GLOVE BIOGEL PI IND STRL 7.5 (GLOVE) ×1 IMPLANT
GLOVE SURG SYN 7.5 PF PI (GLOVE) ×1 IMPLANT
GOWN SRG XL LVL 3 NONREINFORCE (GOWNS) ×1 IMPLANT
GOWN STRL REUS W/ TWL LRG LVL3 (GOWN DISPOSABLE) ×2 IMPLANT
GUIDEWIRE 3.2X400 (WIRE) IMPLANT
HOLDER FOLEY CATH W/STRAP (MISCELLANEOUS) IMPLANT
KIT PATIENT CARE HANA TABLE (KITS) ×1 IMPLANT
KIT TURNOVER CYSTO (KITS) ×1 IMPLANT
MANIFOLD NEPTUNE II (INSTRUMENTS) ×1 IMPLANT
MAT ABSORB FLUID 56X50 GRAY (MISCELLANEOUS) ×1 IMPLANT
NAIL TROCH FIX 10X170 130 (Nail) IMPLANT
NS IRRIG 500ML POUR BTL (IV SOLUTION) ×1 IMPLANT
PACK HIP COMPR (MISCELLANEOUS) ×1 IMPLANT
PAD ARMBOARD POSITIONER FOAM (MISCELLANEOUS) ×1 IMPLANT
PAD CAST 3X4 CTTN HI CHSV (CAST SUPPLIES) ×2 IMPLANT
PENCIL SMOKE EVACUATOR (MISCELLANEOUS) ×1 IMPLANT
SCREW FENES TFNA 95 (Screw) IMPLANT
SCREW LOCK STAR 5X36 (Screw) IMPLANT
SOLN STERILE WATER BTL 1000 ML (IV SOLUTION) ×1 IMPLANT
STAPLER SKIN PROX 35W (STAPLE) ×1 IMPLANT
SUT MON AB 2-0 CT1 36 (SUTURE) ×1 IMPLANT
SUT VIC AB 0 CT1 18XCR BRD 8 (SUTURE) ×1 IMPLANT
SUT VIC AB 0 CT1 36 (SUTURE) ×1 IMPLANT
SUTURE EHLN 3-0 FS-10 30 BLK (SUTURE) IMPLANT
TAPE MICROFOAM 4IN (TAPE) IMPLANT
TRAP FLUID SMOKE EVACUATOR (MISCELLANEOUS) IMPLANT
TRAY FOLEY SLVR 16FR LF STAT (SET/KITS/TRAYS/PACK) IMPLANT

## 2023-12-17 NOTE — TOC Initial Note (Signed)
 Transition of Care [facility]) - Initial/Assessment Note    Patient Details  Name: Rhonda Fields MRN: [id number] Date of Birth: 1950-07-01  [facility] CM/SW Contact:    Shasta DELENA Daring, RN Phone Number: 12/17/2023, 3:52 PM  Clinical Narrative:                 RNCM assessed patient prior to OR. Patient was resting in the bed. No on at bedside. Patient is hard of hearing. States she lives in a single-family home with friends. Typically drives herself to appointments. Uses [facility] on [address] In [place]. Has Walker and bedside commode in home.   Advised her that case management will come back after surgery to help determine discharge plans. Patient verbalized understanding.     Barriers to Discharge: Continued Medical Work up   Patient Goals and CMS Choice            Expected Discharge Plan and Services   Discharge Planning Services: CM Consult                                          Prior Living Arrangements/Services   Lives with:: Friends Patient language and need for interpreter reviewed:: Yes Do you feel safe going back to the place where you live?: Yes      Need for Family Participation in Patient Care: Yes (Comment) Care giver support system in place?: Yes (comment)   Criminal Activity/Legal Involvement Pertinent to Current Situation/Hospitalization: No - Comment as needed  Activities of Daily Living   ADL Screening (condition at time of admission) Independently performs ADLs?: Yes (appropriate for developmental age) Is the patient deaf or have difficulty hearing?: Yes Does the patient have difficulty seeing, even when wearing glasses/contacts?: No Does the patient have difficulty concentrating, remembering, or making decisions?: No  Permission Sought/Granted                  Emotional Assessment Appearance:: Appears stated age Attitude/Demeanor/Rapport: Gracious Affect (typically observed): Appropriate Orientation: :  Oriented to Self, Oriented to Place, Oriented to  Time, Oriented to Situation Alcohol / Substance Use: Not Applicable    Admission diagnosis:  Closed left hip fracture (HCC) [S72.002A] Closed fracture of left hip, initial encounter (HCC) [S72.002A] Patient Active Problem List   Diagnosis Date Noted   Closed left hip fracture (HCC) 12/16/2023   Type II diabetes mellitus with renal manifestations (HCC) 12/16/2023   CKD (chronic kidney disease) stage 2, GFR 60-89 ml/min 12/16/2023   Depression with anxiety 12/16/2023   Fall at home, initial encounter 12/16/2023   Metabolic acidosis 12/16/2023   Incarcerated ventral hernia 10/15/2022   Hip fracture (HCC) 09/23/2022   Displaced spiral fracture of shaft of right femur, initial encounter for closed fracture (HCC) 09/23/2022   Displaced intertrochanteric fracture of right femur, initial encounter for closed fracture (HCC) 09/22/2022   AKI (acute kidney injury) 09/22/2022   Leukocytosis 09/22/2022   GERD (gastroesophageal reflux disease) 09/22/2022   Depression 09/22/2022   Anxiety 09/22/2022   HTN (hypertension) 09/22/2022   HLD (hyperlipidemia) 09/22/2022   DM2 (diabetes mellitus, type 2) (HCC) 01/30/2013   History of total hysterectomy with removal of both tubes and ovaries 04/08/2011   PCP:  Duanne Butler DASEN, MD Pharmacy:   [facility] - [place], [place] - [address] [place] [address] Phone: [phone] Fax:  [phone]  [id number] [place],  - [address] AT [facility] OF [address] [place] [address] [place] [address] Phone: [phone] Fax: [phone]     Social Drivers of Health (SDOH) Social History: SDOH Screenings   Food Insecurity: No Food Insecurity (12/17/2023)  Housing: Low Risk  (12/17/2023)  Transportation Needs: Unmet Transportation Needs (12/17/2023)  Utilities: At Risk (12/17/2023)  Alcohol Screen: Low Risk  (06/02/2023)   Depression (PHQ2-9): Low Risk  (06/02/2023)  Financial Resource Strain: Low Risk  (06/02/2023)  Physical Activity: Inactive (06/02/2023)  Social Connections: Socially Isolated (12/17/2023)  Stress: No Stress Concern Present (06/02/2023)  Tobacco Use: Medium Risk (10/28/2023)  Health Literacy: Adequate Health Literacy (06/02/2023)   SDOH Interventions:     Readmission Risk Interventions     No data to display

## 2023-12-17 NOTE — Anesthesia Procedure Notes (Signed)
 Procedure Name: Intubation Date/Time: 12/17/2023 12:56 PM  Performed by: Dominica Krabbe, CRNAPre-anesthesia Checklist: Patient identified, Emergency Drugs available, Suction available, Patient being monitored and Timeout performed Patient Re-evaluated:Patient Re-evaluated prior to induction Oxygen Delivery Method: Circle system utilized Preoxygenation: Pre-oxygenation with 100% oxygen Induction Type: IV induction and Rapid sequence Laryngoscope Size: McGrath and 3 Grade View: Grade I Tube type: Oral Tube size: 6.5 mm Number of attempts: 1 Airway Equipment and Method: Stylet and Video-laryngoscopy Placement Confirmation: ETT inserted through vocal cords under direct vision, positive ETCO2 and breath sounds checked- equal and bilateral Secured at: 20 cm Tube secured with: Tape Dental Injury: Teeth and Oropharynx as per pre-operative assessment

## 2023-12-17 NOTE — Op Note (Signed)
 [employer] Department of Orthopaedic Surgery Operative Report  Patient:  Rhonda Fields MRN:  [id number] Date of birth:  [birth year]    Date of surgery:  12/17/2023  Location:  [facility]- [address] [place], [place], [place] [address]  Pre-operative Diagnosis: Left intertrochanteric femur fracture  Post-operative Diagnosis: Left intertrochanteric femur fracture  Operation:  ORIF left intertrochanteric femur fracture with cephalomedually nailing  Operative Modifiers:  None   Surgeon:  Arlyss GEANNIE Schneider, DO  First Assistant: Sheppard Stabs, PA   Anesthesia:  Anesthesiologist: Vicci Camellia Glatter, MD; Chesley Lendia LITTIE, MD CRNA: Dominica Krabbe, CRNA General  Tourniquet Time:  None   Blood Loss: 50 cc  Specimens: None  Implants:  Implant Name Type Inv. Item Serial No. Manufacturer Lot No. LRB No. Used Action  NAIL TROCH FIX [id number] 130 - [id number] Nail NAIL TROCH FIX [id number] 130  [employer] [id number] Left 1 Implanted  SCREW FENES TFNA 95 - [id number] Screw SCREW FENES TFNA 95  [employer]  Left 1 Implanted  SCREW LOCK STAR 5X36 - [id number] Screw SCREW LOCK STAR 5X36  [employer]  Left 1 Implanted    Indications: Rhonda Fields is a 73 y.o. female who suffered a mechanical ground level fall on 11/27.  She was brought into the emergency department at [facility] and imaging revealed a left intertrochanteric femur fracture.  Orthopedic surgery was consulted for definitive management.   Operative Indications: We had previously discussed the options, alternatives, potential risks, and expected benefits.  We also discussed expected outcomes from various appropriate alternatives, including non-operative treatment.  The patient asked appropriate questions, and all questions were answered.  The patient wished to proceed with operative treatment.   Procedural details: The patient was identified in the holding area; the history and  physical examination was updated and the surgical site was marked.  All questions were answered.  The patient was similarly identified by the anesthesia team.  The patient was transported the operating room where an additional check-in verification procedure was performed with the team.  Antibiotics were administered.  IV TXA was administered.  Following the OR check-in, the patient was anesthetized.  The patient was positioned supine on the [facility] operating table with appropriate padding and secured.  The contralateral upper extremity was placed on an arm board. The ipsilateral upper extremity was well padded and draped across the patient's chest and held with an arm holder. The affected lower extremity was placed in a well-padded foot holder. It was secured and padded appropriately. The contralateral lower extremity was placed in a well-padded boot, extended and adducted to allow for fluoroscopic imaging.  Attention was turned to the fracture using fluoroscopic spot films.  A provisional reduction maneuver with traction and slight internal rotation of the lower extremity was performed.  We used the C-arm images confirming adequate reduction both in AP and lateral planes.  The surgical extremity was prepped and draped in the usual sterile fashion with multiple layers of antiseptic solution and appropriate occlusive disposable drapes.  The surgical incision was marked with cross hatches for proper postoperative skin alignment facilitation.  A time-out was then performed confirming the patient's site surgical, planned procedure, planned equipment, and planned imaging needs.  Operative Procedure: A 2-3 cm incision was made approximately 5 cm proximal to the palpable tip of the greater trochanter in line with the femur. Dissection was carried down sharply through deep tissue and deep fascia. We placed the guidewire on the greater trochanteric tip 5 degrees  off the intramedullary axis, and centered from front to back.  We confirmed its position with AP and lateral fluoroscopic images. The guidewire was then advanced in the direction of the shaft of the femur. Its position was confirmed with AP and lateral C-arm images. We then used a soft tissue sleeve, placed it over the guidewire to the level of the greater trochanter. We used the entry reamer and reamed the proximal portal under C-arm visualization to appropriate depth. The entry reamer and starting guidewire were then removed. We then opened the appropriate sized nail (10 mm diameter, 170 mm length, 130 degree neck angle). This was then inserted to the appropriate position under C-arm visualization. We then made a lateral incision for the lag screw based off at the outrigger. This incision was carried down sharply through skin and deep fascia. We then placed the drill sleeve through the outrigger to the lateral cortex of the femur.   Due to the basicervical fracture pattern, a small 2 cm incision was made in line with the femoral neck on the lateral aspect of the thigh.  Dissection was carried down through skin, subcutaneous tissue and through IT band.  We then carefully placed a blunt bone hook into the incision staying directly on bone in order to reduce the displaced proximal head neck portion.  Once this reduction was confirmed with C arm, we then placed the guidewire into the femoral head in a center-center position both on AP and lateral C-arm images. We then reamed the lateral cortex for the compression screw. We then focused our attention on reaming for the lag screw. We reamed under direct C-arm visualization to appropriate depth and measured the length of the lag screw off of the pin. The appropriate length lag screw (95 mm) was inserted over the guidewire to appropriate depth based off of the AP and lateral C-arm images. We confirmed position of the lag screw with AP and lateral C-arm images.  The bone hook was then removed at this portion of the procedure.  We  then focused our attention on the distal locking screw. We did this under standard jig technique. Stab incision was made at appropriate position based off of C-arm images. This was carried down to bone. We then drilled and placed an appropriate length distal locking screw (36 mm). It was confirmed to be in appropriate position and appropriate length on AP and lateral C-arm images.   The wounds were then copiously irrigated.  The deep fascia was closed in the proximal two wounds using 0 Vicryl.  The subcutaneous tissue in all wounds was closed using 2-0 Monocryl in an interrupted fashion. The skin was approximated with staples. A waterproof sterile dressing was applied.  The patient tolerated the procedure well and was discharged to PACU in good condition.  Instrument, sponge, and needle counts were correct prior to wound closure and at the conclusion of the case.   POST OPERATIVE PLAN: The patient will be transferred back to the floor once stable. Orders have been provided to begin physical and occupational therapy, weightbearing as tolerated left lower extremity. Patient received a dose of Ancef  preoperatively. We will continue this for 24 hours postoperatively for infection prophylaxis. The patient will also be started on blood thinning medication for DVT prophylaxis. We have consulted social services and case management for discharge planning. We appreciate medical co-management in the care of this patient.    Arlyss Schneider, DO [facility] Orthopedic Surgery

## 2023-12-17 NOTE — Hospital Course (Addendum)
 Hospital course / significant events:   HPI: Rhonda Fields is a 73 y.o. female with medical history significant of HTN, HLD, DM, depression with anxiety, CKD-2, IBS, hard of hearing, headache, deficiency anemia, who presents with fall, left hip pain. Patient states that she had leg cramps when she was in gas station bathroom, and fell, injured her left hip   11/27: admitted to hospitalist w/ ortho consult for L hip fx 11/28: to OR. 11/29: PT/OT recs for SNF, TOC to start process  11/30: stable, await SNF      Consultants:  Orthopedic surgery   Procedures/Surgeries: 12/17/23 L hip fracture repair -  ORIF left intertrochanteric femur fracture with cephalomedually nailing       ASSESSMENT & PLAN:   Comminuted intertrochanteric fractures of the left femur with mildly displaced lesser trochanteric fragment and mild impaction of fracture fragments. . S/p IM nail to L femur 12/17/23 Ortho following WBAT LLE Pain control - dc IV morphine  today, increased Hydrocodone  PT/OT as able DVT Ppx per ortho w/ ASA 81 bid  Follow up ortho outpatient  SNF rehab pending placement   Anxiety/panic Pt reports panicked when working w/ therapy but willing to keep trying to engage w/ PT/OT as able Xanax  prn   Essential HTN IV hydralazine  as needed Home losartan    HLD statin  AKI - resolved - Cr 1.22 on admission 11/27 --> 0.85 11/28 Metabolic acidosis / lactic acidosis - resolved - CO2 19 on admission 11/27 --> 23 11/28, lactate 2.5 --> 1.9 Po hydration Monitor BMP  DM2 non insulin  dependent Last A1C indicates good control at 6.1 Holding metformin  and actos  for now Holding on insulin  unless significant hyperglycemia Monitor Glc periodically   Leukocytosis  Likely reactive w/ injury Follow CBC  CAD/DM2 Held ASA preop, can resume now at bid per ortho for DVT ppx   Chronic back pain Chronic opiate use  Continue home hydrocodone  + pain control w/ hip fx    Anxiety/Depression Continue fluoxetine  20 mg daily  Xanax  added prn   Urinary frequency Continue oxybutinin  GERD Continue PPI  overweight based on BMI: Body mass index is 26.29 kg/m.SABRA Significantly low or high BMI is associated with higher medical risk.  Underweight - under 18  overweight - 25 to 29 obese - 30 or more Class 1 obesity: BMI of 30.0 to 34 Class 2 obesity: BMI of 35.0 to 39 Class 3 obesity: BMI of 40.0 to 49 Super Morbid Obesity: BMI 50-59 Super-super Morbid Obesity: BMI 60+ Healthy nutrition and physical activity advised as adjunct to other disease management and risk reduction treatments    DVT prophylaxis: ASA IV fluids: no continuous IV fluids  Nutrition: carb modified   Central lines / other devices: none  Code Status: FULL CODE ACP documentation reviewed:  none on file in [facility]  [facility] needs: SNF rehab Medical barriers to dispo: none

## 2023-12-17 NOTE — Consult Note (Signed)
 [facility]  CONSULTATION NOTE  Patient Name: Rhonda Fields DOB: 04-26-1950 CSN: [id number] Primary Physician: Duanne Butler DASEN, MD   Reason for Consult: Left hip fracture  HPI: Rhonda Fields is a 74 y.o. female with a medical history significant for HTN, HLD, DM, depression with anxiety, CKD-2, IBS, hard of hearing, headache, deficiency anemia who presents left hip fracture after fall from standing height yesterday.  Patient reports she was on a coffee date with a friend when she experienced a spasm in her left leg.  Due to the spasm, patient fell onto the left lower extremity resulting in immediate pain.  She was unable to arise from the ground as a result of her injury.  Patient denies loss of consciousness as a result of her fall.  EMS transported patient to the hospital for further evaluation.  In the emergency department, patient was found to have a left intertrochanteric femur fracture.  Orthopedics was consulted for definitive management.  Currently, patient admits to significant pain in the left hip.  Denies pain elsewhere in the body.  Denies numbness or paresthesias in the left lower extremity.  No other complaints at this time.   She reports an orthopedic history significant for right hip fracture in September of last year which resulted in intramedullary nail fixation.   Patient is accompanied by daughter in room this morning   Prior blood thinning medication: Patient is unsure Pre-admission ambulation status: Ambulatory without assistive device Home environment: Lives at home with one-step entry     Past Medical History:  Diagnosis Date   Anxiety    Arrhythmia    Chronic headaches    Conjunctivitis 10/31/2012   Depression    Diabetes mellitus without complication (HCC)    Generalized abdominal pain 04/29/2011   Generalized headaches 04/08/2011   GERD (gastroesophageal reflux disease)    Hard of hearing    IBS (irritable  bowel syndrome) 01/20/1984      Past Surgical History:  Procedure Laterality Date   ABDOMINAL HYSTERECTOMY     CHOLECYSTECTOMY     ERCP  04/20/2011   Procedure: ENDOSCOPIC RETROGRADE CHOLANGIOPANCREATOGRAPHY (ERCP);  Surgeon: Lamar JONETTA Aho, MD;  Location: [facility];  Service: Endoscopy;  Laterality: N/A;  case is at 1430 in or   ERCP  04/20/2011   Procedure: ENDOSCOPIC RETROGRADE CHOLANGIOPANCREATOGRAPHY (ERCP);  Surgeon: Lamar JONETTA Aho, MD;  Location: [facility];  Service: Gastroenterology;  Laterality: N/A;   INTRAMEDULLARY (IM) NAIL INTERTROCHANTERIC Right 09/23/2022   Procedure: INTRAMEDULLARY (IM) NAIL INTERTROCHANTERIC;  Surgeon: Jerri Kay HERO, MD;  Location: [facility];  Service: Orthopedics;  Laterality: Right;   VENTRAL HERNIA REPAIR N/A 10/15/2022   Procedure: HERNIA REPAIR VENTRAL ADULT;  Surgeon: Marinda Jayson KIDD, MD;  Location: [facility];  Service: General;  Laterality: N/A;      No current facility-administered medications on file prior to encounter.   Current Outpatient Medications on File Prior to Encounter  Medication Sig Dispense Refill   Alcohol Swabs  (DROPSAFE ALCOHOL PREP) 70 % PADS USE AS DIRECTED PRIOR TO MONITORING BLOOD GLUCOSE UP TO THREE TIMES DAILY 300 each 3   alendronate  (FOSAMAX ) 70 MG tablet TAKE 1 TABLET EVERY 7 DAYS WITH A FULL GLASS OF WATER ON AN EMPTY STOMACH 12 tablet 3   Ascorbic Acid (VITAMIN C PO) Take 1 tablet by mouth daily.     aspirin  EC 81 MG tablet Take 81 mg by mouth daily.     atorvastatin  (LIPITOR) 40 MG tablet TAKE 1 TABLET  EVERY DAY 90 tablet 1   Blood Glucose Calibration (TRUE METRIX LEVEL 1) Low SOLN Use as directed to monitor FSBS 1x daily. Dx: E11.9 100 each 3   Blood Glucose Monitoring Suppl (TRUE METRIX METER) w/Device KIT USE AS DIRECTED 1 kit 3   Calcium  Carb-Cholecalciferol (CALCIUM  + VITAMIN D3 PO) Take 1 tablet by mouth daily.     cyclobenzaprine  (FLEXERIL ) 5 MG tablet Take 1 tablet (5 mg total) by mouth 3 (three) times daily as  needed for muscle spasms. 30 tablet 1   docusate sodium  (COLACE) 100 MG capsule Take 1 capsule (100 mg total) by mouth 2 (two) times daily as needed for mild constipation.     FEROSUL 325 (65 Fe) MG tablet TAKE 1 TABLET EVERY DAY 90 tablet 3   FLUoxetine  (PROZAC ) 20 MG capsule TAKE 1 CAPSULE EVERY DAY 90 capsule 3   fluticasone  (FLONASE ) 50 MCG/ACT nasal spray USE 2 SPRAYS IN EACH NOSTRIL EVERY DAY 48 g 3   furosemide  (LASIX ) 40 MG tablet TAKE 1 TABLET EVERY DAY AS NEEDED FOR EDEMA 90 tablet 3   glucose blood test strip Use as instructed 100 each 3   HYDROcodone -acetaminophen  (NORCO) 5-325 MG tablet Take 1 tablet by mouth every 6 (six) hours as needed for moderate pain (pain score 4-6). 30 tablet 0   losartan  (COZAAR ) 25 MG tablet TAKE 1 TABLET EVERY DAY 90 tablet 1   metFORMIN  (GLUCOPHAGE ) 1000 MG tablet TAKE 1 TABLET TWICE DAILY WITH MEALS 180 tablet 3   Multiple Vitamins-Minerals (ZINC PO) Take 1 tablet by mouth daily.     Omega-3 Fatty Acids (FISH OIL PO) Take 2,000 mg by mouth daily.     ondansetron  (ZOFRAN -ODT) 4 MG disintegrating tablet Take 1 tablet (4 mg total) by mouth every 6 (six) hours as needed for nausea. 20 tablet 0   oxybutynin  (DITROPAN ) 5 MG tablet TAKE 1 TABLET FOUR TIMES DAILY 360 tablet 1   pantoprazole  (PROTONIX ) 40 MG tablet TAKE 1 TABLET TWICE DAILY 180 tablet 3   pioglitazone  (ACTOS ) 30 MG tablet TAKE 1 TABLET EVERY DAY 90 tablet 3   polyethylene glycol (MIRALAX  / GLYCOLAX ) 17 g packet Take 17 g by mouth daily as needed for moderate constipation or severe constipation. (Patient not taking: Reported on 10/28/2023)     triamcinolone  cream (KENALOG ) 0.1 % Apply topically 2 (two) times daily. (Patient taking differently: Apply 1 Application topically 2 (two) times daily as needed (skin irritation, itching).) 30 g 3   TRUEplus Lancets 33G MISC Use as directed to monitor FSBS 1x daily. Dx: E11.9 100 each 3    Review of systems: A review of systems was performed and is negative  unless otherwise noted in the HPI    Family History  Adopted: Yes  Problem Relation Age of Onset   Diabetes Other        Siblings, both sets of grandparents   Liver disease Mother    Kidney disease Mother    Liver disease Brother    Malignant hyperthermia Neg Hx       Social History   Socioeconomic History   Marital status: Widowed    Spouse name: Not on file   Number of children: 4   Years of education: Not on file   Highest education level: Not on file  Occupational History   Occupation: Unemployed  Tobacco Use   Smoking status: Former    Current packs/day: 1.00    Average packs/day: 1 pack/day for 40.0 years (40.0 ttl  pk-yrs)    Types: Cigarettes   Smokeless tobacco: Never  Substance and Sexual Activity   Alcohol use: No   Drug use: No   Sexual activity: Not Currently  Other Topics Concern   Not on file  Social History Narrative   Daughter lives next door.   Widowed. Was married x 37 years.   Social Drivers of Corporate Investment Banker Strain: Low Risk  (06/02/2023)   Overall Financial Resource Strain (CARDIA)    Difficulty of Paying Living Expenses: Not hard at all  Food Insecurity: No Food Insecurity (12/17/2023)   Hunger Vital Sign    Worried About Running Out of Food in the Last Year: Never true    Ran Out of Food in the Last Year: Never true  Transportation Needs: Unmet Transportation Needs (12/17/2023)   PRAPARE - Transportation    Lack of Transportation (Medical): Yes    Lack of Transportation (Non-Medical): Yes  Physical Activity: Inactive (06/02/2023)   Exercise Vital Sign    Days of Exercise per Week: 0 days    Minutes of Exercise per Session: 0 min  Stress: No Stress Concern Present (06/02/2023)   [employer] of Occupational Health - Occupational Stress Questionnaire    Feeling of Stress : Only a little  Social Connections: Socially Isolated (12/17/2023)   Social Connection and Isolation Panel    Frequency of Communication with Friends  and Family: More than three times a week    Frequency of Social Gatherings with Friends and Family: More than three times a week    Attends Religious Services: Never    Database Administrator or Organizations: No    Attends Banker Meetings: Never    Marital Status: Widowed  Intimate Partner Violence: Not At Risk (12/17/2023)   Humiliation, Afraid, Rape, and Kick questionnaire    Fear of Current or Ex-Partner: No    Emotionally Abused: No    Physically Abused: No    Sexually Abused: No      Vitals:   12/17/23 0013 12/17/23 0420  BP: (!) 145/78 (!) 160/88  Pulse: (!) 101 99  Resp: 18 18  Temp: 98.1 F (36.7 C) 98.4 F (36.9 C)  SpO2: 95% 95%    Physical Exam: GEN: In moderate discomfort, AOx3    Left LE: -Left lower extremity is shortened and externally rotated -Skin intact without ecchymosis, erythema, fluctuance, induration -Pain with log roll -Nontender to palpation distally including knee, ankle and foot -Unable to tolerate any movement about the left hip.  She is able to slightly flex her knee -Palpable dorsalis pedis pulse -Brisk cap refill x 5 toes -Sensation intact to light touch to Deep peroneal/Superficial peroneal/Tibial/Saphenous/Sural nerve distributions -Motor function intact to EHL/FHL/Tibialis anterior/Gastrocnemius  Compartments soft and compressible    IMAGING:  X-ray and CT of the left hip were obtained at [facility] on 12/16/2023 and were available for my review.  Per my interpretation, there is a comminuted and displaced intertrochanteric left femur fracture.  Partially visualized right intramedullary nail from prior hip fixation.   ASSESSMENT: Left intertrochanteric femur fracture  PLAN:  -Weight bearing status: Nonweightbearing -Additional labs: Per medicine -Additional imaging: None -Orthopedic Interventions/Procedures: Left intertrochanteric femur intramedullary nail -OR planning: 12/17/2023 -Diet: N.p.o. -PT/OT: Postop -DVT  ppx: Per medicine -Pain control: Multimodal -ABX: Ancef  on-call to the OR -IV TXA on-call to the OR  Patient was seen and examined at bedside this morning with daughter present.  We discussed her left hip fracture injury and  treatment measures moving forward.  Risks and benefits as well as alternatives were discussed with the patient and her daughter in detail.  Patient would like to proceed at this time with left femur intramedullary nail.  I discussed all possible treatment options with the patient  including conservative measures as well as surgical intervention.  Despite consideration of conservative care, the injury was deemed appropriate for operative management.    We discussed the risks of surgical intervention including but not limited to infection, bleeding, DVT/ PE, damage to surrounding structures, need for further surgery, failure of hardware, persistent pain, persistent swelling, complications from anesthesia, stroke, seizure, heart attack, permanent disability, loss of extremity, or even death.  We discussed the benefits of surgical intervention including relief of pain, restoration of function, return to previous activity level.  We also discussed the extensive rehab that would follow surgical intervention under the direction of a dedicated physical/occupational therapist in later transition to patient directed exercises.  After discussing all possible risk and benefits specific to the patient and the surgery, the patient voiced good understanding of their options moving forward in what each would entail.  The patient  then elected to proceed with surgical intervention.     Thank you for the consultation in the coordinated care of this patient. Please call with any questions or concerns.  Arlyss GEANNIE Schneider, DO Orthopedic Surgery & Sports Medicine [place]  8:02 AM 12/17/23

## 2023-12-17 NOTE — Anesthesia Preprocedure Evaluation (Addendum)
 Anesthesia Evaluation  Patient identified by MRN, date of birth, ID band Patient awake    Reviewed: Allergy & Precautions, NPO status , Patient's Chart, lab work & pertinent test results  History of Anesthesia Complications Negative for: history of anesthetic complications  Airway Mallampati: III  TM Distance: >3 FB Neck ROM: full    Dental  (+) Edentulous Lower, Edentulous Upper   Pulmonary Patient abstained from smoking., former smoker   Pulmonary exam normal        Cardiovascular hypertension, On Medications Normal cardiovascular exam     Neuro/Psych  Headaches PSYCHIATRIC DISORDERS Anxiety Depression       GI/Hepatic Neg liver ROS,GERD  ,,  Endo/Other  diabetes    Renal/GU CRFRenal disease     Musculoskeletal   Abdominal   Peds  Hematology negative hematology ROS (+)   Anesthesia Other Findings Past Medical History: No date: Anxiety No date: Arrhythmia No date: Chronic headaches 10/31/2012: Conjunctivitis No date: Depression No date: Diabetes mellitus without complication (HCC) 04/29/2011: Generalized abdominal pain 04/08/2011: Generalized headaches No date: GERD (gastroesophageal reflux disease) No date: Hard of hearing 01/20/1984: IBS (irritable bowel syndrome)  Past Surgical History: No date: ABDOMINAL HYSTERECTOMY No date: CHOLECYSTECTOMY 04/20/2011: ERCP     Comment:  Procedure: ENDOSCOPIC RETROGRADE               CHOLANGIOPANCREATOGRAPHY (ERCP);  Surgeon: Lamar JONETTA Aho, MD;  Location: [facility];  Service: Endoscopy;              Laterality: N/A;  case is at 1430 in or 04/20/2011: ERCP     Comment:  Procedure: ENDOSCOPIC RETROGRADE               CHOLANGIOPANCREATOGRAPHY (ERCP);  Surgeon: Lamar JONETTA Aho, MD;  Location: [facility];  Service:               Gastroenterology;  Laterality: N/A; 09/23/2022: INTRAMEDULLARY (IM) NAIL INTERTROCHANTERIC; Right     Comment:   Procedure: INTRAMEDULLARY (IM) NAIL INTERTROCHANTERIC;                Surgeon: Jerri Kay HERO, MD;  Location: [facility];  Service:               Orthopedics;  Laterality: Right; 10/15/2022: VENTRAL HERNIA REPAIR; N/A     Comment:  Procedure: HERNIA REPAIR VENTRAL ADULT;  Surgeon:               Marinda Jayson KIDD, MD;  Location: [facility];  Service:               General;  Laterality: N/A;  BMI    Body Mass Index: 26.29 kg/m      Reproductive/Obstetrics negative OB ROS                              Anesthesia Physical Anesthesia Plan  ASA: 3  Anesthesia Plan: General ETT   Post-op Pain Management: Ofirmev  IV (intra-op)* and Dilaudid  IV   Induction: Intravenous  PONV Risk Score and Plan: 3 and Ondansetron , Dexamethasone  and Treatment may vary due to age or medical condition  Airway Management Planned: Oral ETT  Additional Equipment:   Intra-op Plan:   Post-operative Plan: Extubation in OR  Informed Consent: I have reviewed the patients History  and Physical, chart, labs and discussed the procedure including the risks, benefits and alternatives for the proposed anesthesia with the patient or authorized representative who has indicated his/her understanding and acceptance.     Dental Advisory Given  Plan Discussed with: Anesthesiologist, CRNA and Surgeon  Anesthesia Plan Comments: (Patient consented for risks of anesthesia including but not limited to:  - adverse reactions to medications - damage to eyes, teeth, lips or other oral mucosa - nerve damage due to positioning  - sore throat or hoarseness - Damage to heart, brain, nerves, lungs, other parts of body or loss of life  Patient voiced understanding and assent.)         Anesthesia Quick Evaluation

## 2023-12-17 NOTE — Progress Notes (Signed)
 PROGRESS NOTE    Rhonda Fields   FMW:[id number] DOB: 1950-12-08  DOA: 12/16/2023 Date of Service: 12/17/23 which is hospital day 1  PCP: Duanne Butler DASEN, MD    Hospital course / significant events:   HPI: Rhonda Fields is a 73 y.o. female with medical history significant of HTN, HLD, DM, depression with anxiety, CKD-2, IBS, hard of hearing, headache, deficiency anemia, who presents with fall, left hip pain. Patient states that she had leg cramps when she was in gas station bathroom, and fell, injured her left hip   11/27: admitted to hospitalist w/ ortho consult for L hip fx 11/28: to OR.     Consultants:  Orthopedic surgery   Procedures/Surgeries: 12/17/23 L hip fracture repair      ASSESSMENT & PLAN:   Comminuted intertrochanteric fractures of the left femur with mildly displaced lesser trochanteric fragment and mild impaction of fracture fragments.  Ortho following Pain control OR today  PT/OT when able DVT Ppx per ortho  Follow up ortho outpatient   Essential HTN IV hydralazine  as needed Home losartan    HLD statin  AKI - resolved - Cr 1.22 on admission 11/27 --> 0.85 11/28 Metabolic acidosis / lactic acidosis - resolved - CO2 19 on admission 11/27 --> 23 11/28, lactate 2.5 --> 1.9 Po hydration Monitor BMP  DM2 non insulin  dependent Last A1C indicates good control at 6.1 Holding metformin  and actos  for now Holding on insulin  unless significant hyperglycemia Monitor Glc   Leukocytosis  Likely reactive w/ injury Follow CBC  CAD/DM2 Hold ASA preop  Chronic back pain Chronic opiate use  Continue home hydrocodone  + pain control w/ hip fx   Anxiety/Depression Continue fluoxetine  20 mg daily   Urinary frequency Continue oxybutinin  GERD Continue PPI  overweight based on BMI: Body mass index is 26.29 kg/m.SABRA Significantly low or high BMI is associated with higher medical risk.  Underweight - under 18  overweight - 25  to 29 obese - 30 or more Class 1 obesity: BMI of 30.0 to 34 Class 2 obesity: BMI of 35.0 to 39 Class 3 obesity: BMI of 40.0 to 49 Super Morbid Obesity: BMI 50-59 Super-super Morbid Obesity: BMI 60+ Healthy nutrition and physical activity advised as adjunct to other disease management and risk reduction treatments    DVT prophylaxis: SCD for now IV fluids: no continuous IV fluids  Nutrition: NPO pending procedure, CARB MODIFIED WHEN ON THE FLOOR POSTOP, order is in, RN may dc NPO order / contact hospitalist or surgeon if concerns  Central lines / other devices: none  Code Status: FULL CODE ACP documentation reviewed:  none on file in [facility]  TOC needs: TBD, expect may need SNF rehab Medical barriers to dispo: to OR today. Expected medical readiness for discharge 1-2 days.              Subjective / Brief ROS:  Patient reports no concerns at this time  Denies CP/SOB.  Pain controlled.  Denies new weakness.    Family Communication: none at this time     Objective Findings:  Vitals:   12/17/23 1545 12/17/23 1556 12/17/23 1600 12/17/23 1607  BP: (!) 144/90  139/89   Pulse: 96 96 97 93  Resp: 17 13 12 16   Temp:   98 F (36.7 C)   TempSrc:      SpO2: 97% 91% 98% 98%  Weight:      Height:        Intake/Output Summary (Last 24 hours)  at 12/17/2023 1613 Last data filed at 12/17/2023 1607 Gross per 24 hour  Intake 2087.5 ml  Output 2420 ml  Net -332.5 ml   Filed Weights   12/16/23 1546 12/16/23 2300 12/17/23 1238  Weight: 62.1 kg 65.2 kg 61.7 kg    Examination:  Physical Exam Constitutional:      General: She is not in acute distress. Cardiovascular:     Rate and Rhythm: Normal rate and regular rhythm.  Pulmonary:     Effort: Pulmonary effort is normal.     Breath sounds: Normal breath sounds.  Musculoskeletal:     Right lower leg: No edema.     Left lower leg: No edema.  Skin:    General: Skin is warm and dry.  Neurological:     Mental Status:  She is alert. Mental status is at baseline.  Psychiatric:        Mood and Affect: Mood normal.        Behavior: Behavior normal.          Scheduled Medications:   [MAR Hold] atorvastatin   40 mg Oral Daily   [MAR Hold] Chlorhexidine  Gluconate Cloth  6 each Topical Daily   [MAR Hold] ferrous sulfate   325 mg Oral Daily   [MAR Hold] FLUoxetine   20 mg Oral Daily   [MAR Hold] lidocaine   1 patch Transdermal Q24H   [MAR Hold] losartan   25 mg Oral Daily   [MAR Hold] oxybutynin   5 mg Oral QID   [MAR Hold] pantoprazole   40 mg Oral BID   [MAR Hold] polyethylene glycol  17 g Oral Daily    Continuous Infusions:  sodium chloride  75 mL/hr at 12/17/23 1249    ceFAZolin  (ANCEF ) IV      PRN Medications:  [MAR Hold] acetaminophen , [MAR Hold] bisacodyl , fentaNYL  (SUBLIMAZE ) injection, [MAR Hold] fluticasone , [MAR Hold] hydrALAZINE , [MAR Hold] HYDROcodone -acetaminophen , [MAR Hold] methocarbamol , [MAR Hold]  morphine  injection, [MAR Hold] ondansetron  (ZOFRAN ) IV, [MAR Hold] mouth rinse, oxyCODONE  **OR** oxyCODONE , [MAR Hold] senna-docusate  Antimicrobials from admission:  Anti-infectives (From admission, onward)    Start     Dose/Rate Route Frequency Ordered Stop   12/17/23 2200  ceFAZolin  (ANCEF ) IVPB 2g/100 mL premix        2 g 200 mL/hr over 30 Minutes Intravenous Every 8 hours 12/17/23 1508 12/18/23 1359   12/17/23 1300  [MAR Hold]  ceFAZolin  (ANCEF ) IVPB 2g/100 mL premix        (MAR Hold since Fri 12/17/2023 at 1224.Hold Reason: Transfer to a Procedural area)   2 g 200 mL/hr over 30 Minutes Intravenous 30 min pre-op 12/16/23 1921 12/17/23 1315           Data Reviewed:  I have personally reviewed the following...  CBC: Recent Labs  Lab 12/16/23 1602 12/17/23 0509  WBC 13.7* 15.4*  NEUTROABS 9.4*  --   HGB 11.3* 11.4*  HCT 34.7* 34.0*  MCV 91.6 88.8  PLT 324 294   Basic Metabolic Panel: Recent Labs  Lab 12/16/23 1602 12/17/23 0509  NA 138 138  K 4.4 4.4  CL 106 103   CO2 19* 23  GLUCOSE 132* 122*  BUN 19 14  CREATININE 1.22* 0.85  CALCIUM  8.6* 8.6*   GFR: Estimated Creatinine Clearance: 51.7 mL/min (by C-G formula based on SCr of 0.85 mg/dL). Liver Function Tests: Recent Labs  Lab 12/16/23 1602  AST 24  ALT 14  ALKPHOS 74  BILITOT 0.2  PROT 6.8  ALBUMIN 3.9   No results for input(s):  LIPASE, AMYLASE in the last 168 hours. No results for input(s): AMMONIA in the last 168 hours. Coagulation Profile: Recent Labs  Lab 12/16/23 1602  INR 1.0   Cardiac Enzymes: No results for input(s): CKTOTAL, CKMB, CKMBINDEX, TROPONINI in the last 168 hours. BNP (last 3 results) No results for input(s): PROBNP in the last 8760 hours. HbA1C: No results for input(s): HGBA1C in the last 72 hours. CBG: Recent Labs  Lab 12/16/23 2109 12/17/23 0845 12/17/23 1215 12/17/23 1519  GLUCAP 160* 129* 124* 130*   Lipid Profile: No results for input(s): CHOL, HDL, LDLCALC, TRIG, CHOLHDL, LDLDIRECT in the last 72 hours. Thyroid Function Tests: No results for input(s): TSH, T4TOTAL, FREET4, T3FREE, THYROIDAB in the last 72 hours. Anemia Panel: No results for input(s): VITAMINB12, FOLATE, FERRITIN, TIBC, IRON , RETICCTPCT in the last 72 hours. Most Recent Urinalysis On File:     Component Value Date/Time   COLORURINE STRAW (A) 10/19/2022 0200   APPEARANCEUR CLEAR (A) 10/19/2022 0200   LABSPEC 1.005 10/19/2022 0200   PHURINE 6.0 10/19/2022 0200   GLUCOSEU NEGATIVE 10/19/2022 0200   HGBUR NEGATIVE 10/19/2022 0200   BILIRUBINUR NEGATIVE 10/19/2022 0200   KETONESUR NEGATIVE 10/19/2022 0200   PROTEINUR NEGATIVE 10/19/2022 0200   NITRITE NEGATIVE 10/19/2022 0200   LEUKOCYTESUR NEGATIVE 10/19/2022 0200   Sepsis Labs: @LABRCNTIP (procalcitonin:4,lacticidven:4) Microbiology: No results found for this or any previous visit (from the past 240 hours).    Radiology Studies last 3 days: DG C-Arm 1-60 Min-No  Report Result Date: 12/17/2023 Fluoroscopy was utilized by the requesting physician.  No radiographic interpretation.   DG C-Arm 1-60 Min-No Report Result Date: 12/17/2023 Fluoroscopy was utilized by the requesting physician.  No radiographic interpretation.   CT Hip Left Wo Contrast Result Date: 12/16/2023 EXAM: CT OF THE LEFT HIP WITHOUT IV CONTRAST 12/16/2023 06:28:04 PM TECHNIQUE: CT of the left hip was performed without the administration of intravenous contrast. Multiplanar reformatted images are provided for review. Automated exposure control, iterative reconstruction, and/or weight based adjustment of the mA/kV was utilized to reduce the radiation dose to as low as reasonably achievable. COMPARISON: Comparison with left hip radiographs 12/16/2023. CLINICAL HISTORY: Hip trauma, fracture suspected, xray done. FINDINGS: BONES: Comminuted intertrochanteric fractures of the femur left hip with mildly displaced lesser trochanteric fragment and mild impaction of fracture fragments. No dislocation at the hip joint. Visualized pubic rami and left hemipelvis appear intact. No aggressive appearing osseous abnormality or periostitis. SOFT TISSUE: No significant soft tissue edema or fluid collections. No soft tissue mass. JOINT: No significant degenerative changes. No osseous erosions. INTRAPELVIC CONTENTS: Limited images of the intrapelvic contents are unremarkable. IMPRESSION: 1. Comminuted intertrochanteric fractures of the left femur with mildly displaced lesser trochanteric fragment and mild impaction of fracture fragments. No dislocation at the hip joint. Electronically signed by: Elsie Gravely MD 12/16/2023 06:33 PM EST RP Workstation: [id number]   DG Hip Unilat With Pelvis 2-3 Views Left Result Date: 12/16/2023 EXAM: 2 or 3 VIEW(S) XRAY OF THE LEFT HIP 12/16/2023 05:19:00 PM COMPARISON: None available. CLINICAL HISTORY: Pain after a fall. FINDINGS: BONES AND JOINTS: The left hip is somewhat  rotated. Skin folds overlying limit examination, but there appears to be a persistent linear lucency along the base of the Femoral Neck and extending to the Greater Trochanteric Region with a slight cortical step-off. This suggests a nondisplaced fracture. CT is suggested for correlation and confirmation. The hip joint is maintained. No significant degenerative changes. Contralateral right hip demonstrates postoperative fixation, incompletely visualized. The pelvis and  sacrum appear intact. The Sacroiliac Joints and Symphysis Pubis are not displaced. SOFT TISSUES: Skin folds overlying limit examination. Vascular calcifications. IMPRESSION: 1. Possible nondisplaced fracture of the left femoral neck extending to the greater trochanteric region with slight cortical step-off. Recommend CT for confirmation. 2. Postoperative fixation in the contralateral right hip, incompletely visualized. Electronically signed by: Elsie Gravely MD 12/16/2023 05:29 PM EST RP Workstation: [id number]   CT Head Wo Contrast Result Date: 12/16/2023 CLINICAL DATA:  Fall. EXAM: CT HEAD WITHOUT CONTRAST CT CERVICAL SPINE WITHOUT CONTRAST TECHNIQUE: Multidetector CT imaging of the head and cervical spine was performed following the standard protocol without intravenous contrast. Multiplanar CT image reconstructions of the cervical spine were also generated. RADIATION DOSE REDUCTION: This exam was performed according to the departmental dose-optimization program which includes automated exposure control, adjustment of the mA and/or kV according to patient size and/or use of iterative reconstruction technique. COMPARISON:  Head and cervical CT, 09/22/2022 FINDINGS: CT HEAD FINDINGS Brain: No evidence of acute infarction, hemorrhage, hydrocephalus, extra-axial collection or mass lesion/mass effect. Vascular: No hyperdense vessel or unexpected calcification. Skull: Normal. Negative for fracture or focal lesion. Sinuses/Orbits: Globes and orbits  are unremarkable. Sinuses are essentially clear. Other: None. CT CERVICAL SPINE FINDINGS Alignment: Slight degenerative anterolisthesis of C4 on C5. No other malalignment. Skull base and vertebrae: No acute fracture. No primary bone lesion or focal pathologic process. Soft tissues and spinal canal: No prevertebral fluid or swelling. No visible canal hematoma. Disc levels: Mild loss of disc height at C5-C6 with moderate loss of disc height at C6-C7. Facet degenerative change noted bilaterally most evident along the mid cervical spine. No significant disc bulging or convincing disc herniation. Upper chest: No acute findings. Other: None. IMPRESSION: HEAD CT 1. No acute intracranial abnormalities. CERVICAL CT 1. No fracture or acute finding. Electronically Signed   By: Alm Parkins M.D.   On: 12/16/2023 16:59   CT Cervical Spine Wo Contrast Result Date: 12/16/2023 CLINICAL DATA:  Fall. EXAM: CT HEAD WITHOUT CONTRAST CT CERVICAL SPINE WITHOUT CONTRAST TECHNIQUE: Multidetector CT imaging of the head and cervical spine was performed following the standard protocol without intravenous contrast. Multiplanar CT image reconstructions of the cervical spine were also generated. RADIATION DOSE REDUCTION: This exam was performed according to the departmental dose-optimization program which includes automated exposure control, adjustment of the mA and/or kV according to patient size and/or use of iterative reconstruction technique. COMPARISON:  Head and cervical CT, 09/22/2022 FINDINGS: CT HEAD FINDINGS Brain: No evidence of acute infarction, hemorrhage, hydrocephalus, extra-axial collection or mass lesion/mass effect. Vascular: No hyperdense vessel or unexpected calcification. Skull: Normal. Negative for fracture or focal lesion. Sinuses/Orbits: Globes and orbits are unremarkable. Sinuses are essentially clear. Other: None. CT CERVICAL SPINE FINDINGS Alignment: Slight degenerative anterolisthesis of C4 on C5. No other  malalignment. Skull base and vertebrae: No acute fracture. No primary bone lesion or focal pathologic process. Soft tissues and spinal canal: No prevertebral fluid or swelling. No visible canal hematoma. Disc levels: Mild loss of disc height at C5-C6 with moderate loss of disc height at C6-C7. Facet degenerative change noted bilaterally most evident along the mid cervical spine. No significant disc bulging or convincing disc herniation. Upper chest: No acute findings. Other: None. IMPRESSION: HEAD CT 1. No acute intracranial abnormalities. CERVICAL CT 1. No fracture or acute finding. Electronically Signed   By: Alm Parkins M.D.   On: 12/16/2023 16:59         Laneta Blunt, DO [facility] 12/17/2023, 4:13  PM    Dictation software may have been used to generate the above note. Typos may occur and escape review in typed/dictated notes. Please contact Dr Marsa directly for clarity if needed.  Staff may message me via secure chat in Epic  but this may not receive an immediate response,  please page me for urgent matters!  If 7PM-7AM, please contact night coverage [email or web address]

## 2023-12-17 NOTE — Transfer of Care (Signed)
 Immediate Anesthesia Transfer of Care Note  Patient: Rhonda Fields  Procedure(s) Performed: FIXATION, FRACTURE, INTERTROCHANTERIC, WITH INTRAMEDULLARY ROD (Left)  Patient Location: PACU  Anesthesia Type:General  Level of Consciousness: sedated and drowsy  Airway & Oxygen Therapy: Patient Spontanous Breathing and Patient connected to face mask oxygen  Post-op Assessment: Report given to RN and Post -op Vital signs reviewed and stable  Post vital signs: Reviewed and stable  Last Vitals:  Vitals Value Taken Time  BP 122/74 12/17/23 15:16  Temp    Pulse 98 12/17/23 15:16  Resp 16 12/17/23 15:16  SpO2 99 % 12/17/23 15:16    Last Pain:  Vitals:   12/17/23 1238  TempSrc: Temporal  PainSc:          Complications: No notable events documented.

## 2023-12-17 NOTE — Plan of Care (Signed)
  Problem: Coping: Goal: Ability to adjust to condition or change in health will improve Outcome: Progressing   Problem: Fluid Volume: Goal: Ability to maintain a balanced intake and output will improve Outcome: Progressing   Problem: Coping: Goal: Level of anxiety will decrease Outcome: Progressing   Problem: Elimination: Goal: Will not experience complications related to urinary retention Outcome: Progressing   Problem: Safety: Goal: Ability to remain free from injury will improve Outcome: Progressing

## 2023-12-17 NOTE — Progress Notes (Signed)
 Initial Nutrition Assessment  DOCUMENTATION CODES:   Not applicable  INTERVENTION:   -Once diet is advanced, add:   -Glucerna Shake po TID, each supplement provides 220 kcal and 10 grams of protein  -MVI with minerals daily  NUTRITION DIAGNOSIS:   Increased nutrient needs related to post-op healing as evidenced by estimated needs.  GOAL:   Patient will meet greater than or equal to 90% of their needs  MONITOR:   PO intake, Supplement acceptance, Diet advancement  REASON FOR ASSESSMENT:   Consult Assessment of nutrition requirement/status, Hip fracture protocol  ASSESSMENT:   73 y.o. female with medical history significant of HTN, HLD, DM, depression with anxiety, CKD-2, IBS, hard of hearing, headache, deficiency anemia, who presents with fall, left hip pain.  Patient admitted with closed left hip fracture.   Reviewed I/O's: +388 ml x 24 hours  UOP: 700 ml x 24 hours  Per orthopedics notes, plan for left intertrochanteric femur intramedullary nail. Pt is currently NPO for procedure.   Spoke with patient and daughter at bedside. Patient is hard of hearing and hears better on her right side. She reports feeling okay today; she understands rationale for NPO order. She reports she has a fair appetite at baseline, generally consuming 2-3 meals per day (Breakfast: cereal; Lunch: sandwich with carrots and celery; Dinner: meat, starch, and vegetable). Patient states intake of dinner is variable based upon what is served; she currently lives with a friend who prepares dinner and patient does not always like what it is served.   Patient and daughter report that patient is often unsteady on surface in which she needs to step up and sometimes loses her balance. Prior to this admission, last fall was in her driveways about 2 months ago. Patoient also reports she had pinning on the opposite hip about a year ago.   Reviewed weight history. No weight loss noted over the past 6 months.  Patient reports weight stability; her weight usually ranges between 140-146#.   Discussed importance of good meal and supplement intake to promote healing. Patient amenable to supplements.   Medications reviewed and include ferrous sulfate , protonix , and miralax .   Lab Results  Component Value Date   HGBA1C 6.1 (H) 10/27/2023   PTA DM medications are 1000 mg metformin  BID.   Labs reviewed: CBGS: 129-160 (inpatient orders for glycemic control are none).    NUTRITION - FOCUSED PHYSICAL EXAM:  Flowsheet Row Most Recent Value  Orbital Region No depletion  Upper Arm Region No depletion  Thoracic and Lumbar Region No depletion  Buccal Region No depletion  Temple Region No depletion  Clavicle Bone Region No depletion  Clavicle and Acromion Bone Region No depletion  Scapular Bone Region No depletion  Dorsal Hand No depletion  Patellar Region No depletion  Anterior Thigh Region No depletion  Posterior Calf Region No depletion  Edema (RD Assessment) None  Hair Reviewed  Eyes Reviewed  Mouth Reviewed  Skin Reviewed  Nails Reviewed    Diet Order:   Diet Order             Diet Carb Modified Fluid consistency: Thin; Room service appropriate? Yes  Diet effective now           Diet NPO time specified Except for: Ice Chips, Sips with Meds  Diet effective now                   EDUCATION NEEDS:   Education needs have been addressed  Skin:  Skin  Assessment: Reviewed RN Assessment  Last BM:  12/16/23  Height:   Ht Readings from Last 1 Encounters:  12/16/23 5' 2 (1.575 m)    Weight:   Wt Readings from Last 1 Encounters:  12/16/23 65.2 kg    Ideal Body Weight:  50 kg  BMI:  Body mass index is 26.29 kg/m.  Estimated Nutritional Needs:   Kcal:  1750-1950  Protein:  90-105 grams  Fluid:  1.7-1.9 L    Margery ORN, RD, LDN, CDCES Registered Dietitian III Certified Diabetes Care and Education Specialist If unable to reach this RD, please use RD Inpatient  group chat on secure chat between hours of 8am-4 pm daily

## 2023-12-17 NOTE — Plan of Care (Signed)
  Problem: Education: Goal: Knowledge of General Education information will improve Description: Including pain rating scale, medication(s)/side effects and non-pharmacologic comfort measures Outcome: Progressing   Problem: Skin Integrity: Goal: Risk for impaired skin integrity will decrease Outcome: Progressing   Problem: Clinical Measurements: Goal: Diagnostic test results will improve Outcome: Progressing   Problem: Clinical Measurements: Goal: Will remain free from infection Outcome: Progressing

## 2023-12-18 DIAGNOSIS — S72002A Fracture of unspecified part of neck of left femur, initial encounter for closed fracture: Secondary | ICD-10-CM | POA: Diagnosis not present

## 2023-12-18 LAB — BASIC METABOLIC PANEL WITH GFR
Anion gap: 10 (ref 5–15)
BUN: 11 mg/dL (ref 8–23)
CO2: 24 mmol/L (ref 22–32)
Calcium: 8.4 mg/dL — ABNORMAL LOW (ref 8.9–10.3)
Chloride: 104 mmol/L (ref 98–111)
Creatinine, Ser: 0.9 mg/dL (ref 0.44–1.00)
GFR, Estimated: >60 mL/min (ref >60)
Glucose, Bld: 134 mg/dL — ABNORMAL HIGH (ref 70–99)
Potassium: 4.4 mmol/L (ref 3.5–5.1)
Sodium: 137 mmol/L (ref 135–145)

## 2023-12-18 LAB — CBC
HCT: 30.6 % — ABNORMAL LOW (ref 36.0–46.0)
Hemoglobin: 10.3 g/dL — ABNORMAL LOW (ref 12.0–15.0)
MCH: 30 pg (ref 26.0–34.0)
MCHC: 33.7 g/dL (ref 30.0–36.0)
MCV: 89.2 fL (ref 80.0–100.0)
Platelets: 237 K/uL (ref 150–400)
RBC: 3.43 MIL/uL — ABNORMAL LOW (ref 3.87–5.11)
RDW: 12.8 % (ref 11.5–15.5)
WBC: 15 K/uL — ABNORMAL HIGH (ref 4.0–10.5)
nRBC: 0 % (ref 0.0–0.2)

## 2023-12-18 LAB — HEMOGLOBIN AND HEMATOCRIT, BLOOD
HCT: 31.7 % — ABNORMAL LOW (ref 36.0–46.0)
Hemoglobin: 10.8 g/dL — ABNORMAL LOW (ref 12.0–15.0)

## 2023-12-18 LAB — GLUCOSE, CAPILLARY
Glucose-Capillary: 113 mg/dL — ABNORMAL HIGH (ref 70–99)
Glucose-Capillary: 153 mg/dL — ABNORMAL HIGH (ref 70–99)

## 2023-12-18 MED ORDER — METFORMIN HCL 500 MG PO TABS
1000.0000 mg | ORAL_TABLET | Freq: Two times a day (BID) | ORAL | Status: DC
  Administered 2023-12-18 – 2023-12-21 (×7): 1000 mg via ORAL
  Filled 2023-12-18 (×7): qty 2

## 2023-12-18 MED ORDER — ASPIRIN 81 MG PO TBEC
81.0000 mg | DELAYED_RELEASE_TABLET | Freq: Two times a day (BID) | ORAL | Status: DC
  Administered 2023-12-18 – 2023-12-21 (×7): 81 mg via ORAL
  Filled 2023-12-18 (×7): qty 1

## 2023-12-18 MED ORDER — ALPRAZOLAM 0.5 MG PO TABS
0.5000 mg | ORAL_TABLET | Freq: Two times a day (BID) | ORAL | Status: DC | PRN
  Administered 2023-12-18: 0.5 mg via ORAL
  Filled 2023-12-18: qty 1

## 2023-12-18 NOTE — Progress Notes (Signed)
 PROGRESS NOTE    Rhonda Fields   FMW:[id number] DOB: Apr 11, 1950  DOA: 12/16/2023 Date of Service: 12/18/23 which is hospital day 2  PCP: Duanne Butler DASEN, MD    Hospital course / significant events:   HPI: Rhonda Fields is a 73 y.o. female with medical history significant of HTN, HLD, DM, depression with anxiety, CKD-2, IBS, hard of hearing, headache, deficiency anemia, who presents with fall, left hip pain. Patient states that she had leg cramps when she was in gas station bathroom, and fell, injured her left hip   11/27: admitted to hospitalist w/ ortho consult for L hip fx 11/28: to OR. 11/29: PT/OT recs for SNF, TOC to start process       Consultants:  Orthopedic surgery   Procedures/Surgeries: 12/17/23 L hip fracture repair -  ORIF left intertrochanteric femur fracture with cephalomedually nailing       ASSESSMENT & PLAN:   Comminuted intertrochanteric fractures of the left femur with mildly displaced lesser trochanteric fragment and mild impaction of fracture fragments. . S/p IM nail to L femur 12/17/23 Ortho following Pain control PT/OT as able DVT Ppx per ortho w/ ASA 81 bid  Follow up ortho outpatient  SNF rehab pending placement   Essential HTN IV hydralazine  as needed Home losartan    HLD statin  AKI - resolved - Cr 1.22 on admission 11/27 --> 0.85 11/28 Metabolic acidosis / lactic acidosis - resolved - CO2 19 on admission 11/27 --> 23 11/28, lactate 2.5 --> 1.9 Po hydration Monitor BMP  DM2 non insulin  dependent Last A1C indicates good control at 6.1 Holding metformin  and actos  for now Holding on insulin  unless significant hyperglycemia Monitor Glc periodically   Leukocytosis  Likely reactive w/ injury Follow CBC  CAD/DM2 Held ASA preop, can resume now at bid per ortho for DVT ppx   Chronic back pain Chronic opiate use  Continue home hydrocodone  + pain control w/ hip fx   Anxiety/Depression Continue fluoxetine   20 mg daily   Urinary frequency Continue oxybutinin  GERD Continue PPI  overweight based on BMI: Body mass index is 26.29 kg/m.SABRA Significantly low or high BMI is associated with higher medical risk.  Underweight - under 18  overweight - 25 to 29 obese - 30 or more Class 1 obesity: BMI of 30.0 to 34 Class 2 obesity: BMI of 35.0 to 39 Class 3 obesity: BMI of 40.0 to 49 Super Morbid Obesity: BMI 50-59 Super-super Morbid Obesity: BMI 60+ Healthy nutrition and physical activity advised as adjunct to other disease management and risk reduction treatments    DVT prophylaxis: ASA IV fluids: no continuous IV fluids  Nutrition: carb modified   Central lines / other devices: none  Code Status: FULL CODE ACP documentation reviewed:  none on file in [facility]  TOC needs: TBD, expect may need SNF rehab Medical barriers to dispo: to OR today. Expected medical readiness for discharge 1-2 days.              Subjective / Brief ROS:  Patient reports no concerns at this time other than back pain, hip pain, anxiety on occasion Denies CP/SOB. Pain controlled.  Denies new weakness.    Family Communication: none at this time     Objective Findings:  Vitals:   12/18/23 0133 12/18/23 0522 12/18/23 0800 12/18/23 1341  BP: 132/88 122/75 (!) 155/81 (!) 143/75  Pulse: (!) 108 (!) 103 (!) 105   Resp: 17 16 18 18   Temp: (!) 97.4 F (36.3 C)  99 F (37.2 C) 99.2 F (37.3 C) 98.4 F (36.9 C)  TempSrc:      SpO2: 93% 94% 96% 100%  Weight:      Height:        Intake/Output Summary (Last 24 hours) at 12/18/2023 1524 Last data filed at 12/18/2023 0900 Gross per 24 hour  Intake 1128.42 ml  Output 2350 ml  Net -1221.58 ml   Filed Weights   12/16/23 1546 12/16/23 2300 12/17/23 1238  Weight: 62.1 kg 65.2 kg 61.7 kg    Examination:  Physical Exam Constitutional:      General: She is not in acute distress. Cardiovascular:     Rate and Rhythm: Normal rate and regular rhythm.   Pulmonary:     Effort: Pulmonary effort is normal.     Breath sounds: Normal breath sounds.  Musculoskeletal:     Right lower leg: No edema.     Left lower leg: No edema.  Skin:    General: Skin is warm and dry.  Neurological:     Mental Status: She is alert. Mental status is at baseline.  Psychiatric:        Mood and Affect: Mood normal.        Behavior: Behavior normal.          Scheduled Medications:   aspirin  EC  81 mg Oral BID   atorvastatin   40 mg Oral Daily   Chlorhexidine  Gluconate Cloth  6 each Topical Daily   ferrous sulfate   325 mg Oral Daily   FLUoxetine   20 mg Oral Daily   lidocaine   1 patch Transdermal Q24H   losartan   25 mg Oral Daily   metFORMIN   1,000 mg Oral BID WC   oxybutynin   5 mg Oral QID   pantoprazole   40 mg Oral BID   polyethylene glycol  17 g Oral Daily    Continuous Infusions:    PRN Medications:  acetaminophen , ALPRAZolam , bisacodyl , fluticasone , hydrALAZINE , HYDROcodone -acetaminophen , methocarbamol , morphine  injection, ondansetron  (ZOFRAN ) IV, mouth rinse, senna-docusate  Antimicrobials from admission:  Anti-infectives (From admission, onward)    Start     Dose/Rate Route Frequency Ordered Stop   12/17/23 2200  ceFAZolin  (ANCEF ) IVPB 2g/100 mL premix        2 g 200 mL/hr over 30 Minutes Intravenous Every 8 hours 12/17/23 1508 12/18/23 0554   12/17/23 1300  ceFAZolin  (ANCEF ) IVPB 2g/100 mL premix        2 g 200 mL/hr over 30 Minutes Intravenous 30 min pre-op 12/16/23 1921 12/17/23 1315           Data Reviewed:  I have personally reviewed the following...  CBC: Recent Labs  Lab 12/16/23 1602 12/17/23 0509 12/17/23 2317 12/18/23 0514 12/18/23 1330  WBC 13.7* 15.4*  --  15.0*  --   NEUTROABS 9.4*  --   --   --   --   HGB 11.3* 11.4* 9.8* 10.3* 10.8*  HCT 34.7* 34.0* 28.7* 30.6* 31.7*  MCV 91.6 88.8  --  89.2  --   PLT 324 294  --  237  --    Basic Metabolic Panel: Recent Labs  Lab 12/16/23 1602 12/17/23 0509  12/18/23 0514  NA 138 138 137  K 4.4 4.4 4.4  CL 106 103 104  CO2 19* 23 24  GLUCOSE 132* 122* 134*  BUN 19 14 11   CREATININE 1.22* 0.85 0.90  CALCIUM  8.6* 8.6* 8.4*   GFR: Estimated Creatinine Clearance: 48.8 mL/min (by C-G formula based on SCr  of 0.9 mg/dL). Liver Function Tests: Recent Labs  Lab 12/16/23 1602  AST 24  ALT 14  ALKPHOS 74  BILITOT 0.2  PROT 6.8  ALBUMIN 3.9   No results for input(s): LIPASE, AMYLASE in the last 168 hours. No results for input(s): AMMONIA in the last 168 hours. Coagulation Profile: Recent Labs  Lab 12/16/23 1602  INR 1.0   Cardiac Enzymes: No results for input(s): CKTOTAL, CKMB, CKMBINDEX, TROPONINI in the last 168 hours. BNP (last 3 results) No results for input(s): PROBNP in the last 8760 hours. HbA1C: No results for input(s): HGBA1C in the last 72 hours. CBG: Recent Labs  Lab 12/17/23 1519 12/17/23 1705 12/17/23 2131 12/18/23 0743 12/18/23 1141  GLUCAP 130* 166* 225* 113* 153*   Lipid Profile: No results for input(s): CHOL, HDL, LDLCALC, TRIG, CHOLHDL, LDLDIRECT in the last 72 hours. Thyroid Function Tests: No results for input(s): TSH, T4TOTAL, FREET4, T3FREE, THYROIDAB in the last 72 hours. Anemia Panel: No results for input(s): VITAMINB12, FOLATE, FERRITIN, TIBC, IRON , RETICCTPCT in the last 72 hours. Most Recent Urinalysis On File:     Component Value Date/Time   COLORURINE STRAW (A) 10/19/2022 0200   APPEARANCEUR CLEAR (A) 10/19/2022 0200   LABSPEC 1.005 10/19/2022 0200   PHURINE 6.0 10/19/2022 0200   GLUCOSEU NEGATIVE 10/19/2022 0200   HGBUR NEGATIVE 10/19/2022 0200   BILIRUBINUR NEGATIVE 10/19/2022 0200   KETONESUR NEGATIVE 10/19/2022 0200   PROTEINUR NEGATIVE 10/19/2022 0200   NITRITE NEGATIVE 10/19/2022 0200   LEUKOCYTESUR NEGATIVE 10/19/2022 0200   Sepsis Labs: @LABRCNTIP (procalcitonin:4,lacticidven:4) Microbiology: No results found for this or any  previous visit (from the past 240 hours).    Radiology Studies last 3 days: DG HIP UNILAT W OR W/O PELVIS 2-3 VIEWS LEFT Result Date: 12/17/2023 EXAM: 2 OR MORE VIEW(S) XRAY OF THE PELVIS AND LEFT HIP 12/17/2023 03:35:00 PM COMPARISON: 12/16/2023 CLINICAL HISTORY: Post-operative state FINDINGS: BONES AND JOINTS: SI joints are symmetric. Interval IM nail fixation of previously seen left intertrochanteric fracture. Remote right femoral ORIF. Bilateral hips demonstrate normal alignment. SOFT TISSUES: Expected postoperative changes including left hip subcutaneous air. IMPRESSION: 1. Interval IM nail fixation of previously seen left intertrochanteric fracture. Electronically signed by: Norman Gatlin MD 12/17/2023 07:23 PM EST RP Workstation: [id number]   DG HIP UNILAT WITH PELVIS 2-3 VIEWS LEFT Result Date: 12/17/2023 CLINICAL DATA:  Left femoral intramedullary nail placement EXAM: DG HIP (WITH OR WITHOUT PELVIS) 3V LEFT COMPARISON:  Left hip radiograph dated 12/16/2023 FINDINGS: Three fluoroscopic images obtained during left femoral intramedullary nail placement. 2 minutes 57 seconds fluoro time utilized. Radiation dose 16.015 mGy Kerma. Please see performing physicians operative report for full details. IMPRESSION: Fluoroscopic images were obtained for intraoperative guidance of left femoral intramedullary nail placement. Electronically Signed   By: Limin  Xu M.D.   On: 12/17/2023 19:08   DG C-Arm 1-60 Min-No Report Result Date: 12/17/2023 Fluoroscopy was utilized by the requesting physician.  No radiographic interpretation.   DG C-Arm 1-60 Min-No Report Result Date: 12/17/2023 Fluoroscopy was utilized by the requesting physician.  No radiographic interpretation.   CT Hip Left Wo Contrast Result Date: 12/16/2023 EXAM: CT OF THE LEFT HIP WITHOUT IV CONTRAST 12/16/2023 06:28:04 PM TECHNIQUE: CT of the left hip was performed without the administration of intravenous contrast. Multiplanar reformatted  images are provided for review. Automated exposure control, iterative reconstruction, and/or weight based adjustment of the mA/kV was utilized to reduce the radiation dose to as low as reasonably achievable. COMPARISON: Comparison with left hip radiographs  12/16/2023. CLINICAL HISTORY: Hip trauma, fracture suspected, xray done. FINDINGS: BONES: Comminuted intertrochanteric fractures of the femur left hip with mildly displaced lesser trochanteric fragment and mild impaction of fracture fragments. No dislocation at the hip joint. Visualized pubic rami and left hemipelvis appear intact. No aggressive appearing osseous abnormality or periostitis. SOFT TISSUE: No significant soft tissue edema or fluid collections. No soft tissue mass. JOINT: No significant degenerative changes. No osseous erosions. INTRAPELVIC CONTENTS: Limited images of the intrapelvic contents are unremarkable. IMPRESSION: 1. Comminuted intertrochanteric fractures of the left femur with mildly displaced lesser trochanteric fragment and mild impaction of fracture fragments. No dislocation at the hip joint. Electronically signed by: Elsie Gravely MD 12/16/2023 06:33 PM EST RP Workstation: [id number]   DG Hip Unilat With Pelvis 2-3 Views Left Result Date: 12/16/2023 EXAM: 2 or 3 VIEW(S) XRAY OF THE LEFT HIP 12/16/2023 05:19:00 PM COMPARISON: None available. CLINICAL HISTORY: Pain after a fall. FINDINGS: BONES AND JOINTS: The left hip is somewhat rotated. Skin folds overlying limit examination, but there appears to be a persistent linear lucency along the base of the Femoral Neck and extending to the Greater Trochanteric Region with a slight cortical step-off. This suggests a nondisplaced fracture. CT is suggested for correlation and confirmation. The hip joint is maintained. No significant degenerative changes. Contralateral right hip demonstrates postoperative fixation, incompletely visualized. The pelvis and sacrum appear intact. The Sacroiliac  Joints and Symphysis Pubis are not displaced. SOFT TISSUES: Skin folds overlying limit examination. Vascular calcifications. IMPRESSION: 1. Possible nondisplaced fracture of the left femoral neck extending to the greater trochanteric region with slight cortical step-off. Recommend CT for confirmation. 2. Postoperative fixation in the contralateral right hip, incompletely visualized. Electronically signed by: Elsie Gravely MD 12/16/2023 05:29 PM EST RP Workstation: [id number]   CT Head Wo Contrast Result Date: 12/16/2023 CLINICAL DATA:  Fall. EXAM: CT HEAD WITHOUT CONTRAST CT CERVICAL SPINE WITHOUT CONTRAST TECHNIQUE: Multidetector CT imaging of the head and cervical spine was performed following the standard protocol without intravenous contrast. Multiplanar CT image reconstructions of the cervical spine were also generated. RADIATION DOSE REDUCTION: This exam was performed according to the departmental dose-optimization program which includes automated exposure control, adjustment of the mA and/or kV according to patient size and/or use of iterative reconstruction technique. COMPARISON:  Head and cervical CT, 09/22/2022 FINDINGS: CT HEAD FINDINGS Brain: No evidence of acute infarction, hemorrhage, hydrocephalus, extra-axial collection or mass lesion/mass effect. Vascular: No hyperdense vessel or unexpected calcification. Skull: Normal. Negative for fracture or focal lesion. Sinuses/Orbits: Globes and orbits are unremarkable. Sinuses are essentially clear. Other: None. CT CERVICAL SPINE FINDINGS Alignment: Slight degenerative anterolisthesis of C4 on C5. No other malalignment. Skull base and vertebrae: No acute fracture. No primary bone lesion or focal pathologic process. Soft tissues and spinal canal: No prevertebral fluid or swelling. No visible canal hematoma. Disc levels: Mild loss of disc height at C5-C6 with moderate loss of disc height at C6-C7. Facet degenerative change noted bilaterally most evident  along the mid cervical spine. No significant disc bulging or convincing disc herniation. Upper chest: No acute findings. Other: None. IMPRESSION: HEAD CT 1. No acute intracranial abnormalities. CERVICAL CT 1. No fracture or acute finding. Electronically Signed   By: Alm Parkins M.D.   On: 12/16/2023 16:59   CT Cervical Spine Wo Contrast Result Date: 12/16/2023 CLINICAL DATA:  Fall. EXAM: CT HEAD WITHOUT CONTRAST CT CERVICAL SPINE WITHOUT CONTRAST TECHNIQUE: Multidetector CT imaging of the head and cervical spine was performed following the standard  protocol without intravenous contrast. Multiplanar CT image reconstructions of the cervical spine were also generated. RADIATION DOSE REDUCTION: This exam was performed according to the departmental dose-optimization program which includes automated exposure control, adjustment of the mA and/or kV according to patient size and/or use of iterative reconstruction technique. COMPARISON:  Head and cervical CT, 09/22/2022 FINDINGS: CT HEAD FINDINGS Brain: No evidence of acute infarction, hemorrhage, hydrocephalus, extra-axial collection or mass lesion/mass effect. Vascular: No hyperdense vessel or unexpected calcification. Skull: Normal. Negative for fracture or focal lesion. Sinuses/Orbits: Globes and orbits are unremarkable. Sinuses are essentially clear. Other: None. CT CERVICAL SPINE FINDINGS Alignment: Slight degenerative anterolisthesis of C4 on C5. No other malalignment. Skull base and vertebrae: No acute fracture. No primary bone lesion or focal pathologic process. Soft tissues and spinal canal: No prevertebral fluid or swelling. No visible canal hematoma. Disc levels: Mild loss of disc height at C5-C6 with moderate loss of disc height at C6-C7. Facet degenerative change noted bilaterally most evident along the mid cervical spine. No significant disc bulging or convincing disc herniation. Upper chest: No acute findings. Other: None. IMPRESSION: HEAD CT 1. No  acute intracranial abnormalities. CERVICAL CT 1. No fracture or acute finding. Electronically Signed   By: Alm Parkins M.D.   On: 12/16/2023 16:59         Collene Massimino, DO [facility] 12/18/2023, 3:24 PM    Dictation software may have been used to generate the above note. Typos may occur and escape review in typed/dictated notes. Please contact Dr Marsa directly for clarity if needed.  Staff may message me via secure chat in Epic  but this may not receive an immediate response,  please page me for urgent matters!  If 7PM-7AM, please contact night coverage [email or web address]

## 2023-12-18 NOTE — Progress Notes (Addendum)
 [facility] Post-Op Note   Patient name: Rhonda Fields MRN: [id number] DOB: 1950-12-14 Date: 12/18/2023  1 Day Post-Op s/p ORIF left intertrochanteric femur fracture with cephalomedullary nailing  Subjective:  No acute events overnight. Patient resting comfortably in bed this morning. Denies new or worsening pain. Denies numbness/tingling. Denies systemic signs of illness including fever, chills, nausea, vomiting, chest pain, shortness of breath.   Patient seen by PT earlier today. Patient able to stand and pivot in chair. Unable to sit in recliner.  Reports mild nausea during PT, but feeling better now. Appetite has returned, looking forward to lunch.  Vitals:   12/18/23 0522 12/18/23 0800  BP: 122/75 (!) 155/81  Pulse: (!) 103 (!) 105  Resp: 16 18  Temp: 99 F (37.2 C) 99.2 F (37.3 C)  SpO2: 94% 96%    Physical Exam:  GEN: NAD, alert, awake, follows commands for exam  LLE: Compartments soft and compressible Dressing clean, dry and intact. Mild ecchymosis. Mild to moderate pain with passive short arc ROM of hip. No pain with passive movement of knee and ankle Palpable dorsalis pedis/posterior tibialis pulses Brisk cap refill x 5 toes Sensation intact to light touch to Deep peroneal/Superficial peroneal/Tibial/Saphenous/Sural nerve distributions Motor function intact to EHL/FHL/Tibialis anterior/Gastrocnemius    Imaging: No new imaging performed today Post-op xray performed yesterday revealed interval IM nail fixation seen for left intertrochanteric fracture. Nail in appropriate place.   Assessment: 1 Day Post-Op s/p ORIF left intertrochanteric femur fracture with cephalomedullary nailing  Plan: -Patient healing well. Encouraged continuing to eat and drink.  -Weightbearing status: As tolerated for left lower extremity -Monitor H/H: Today 12/18/2023 at 5:14am revealed 10.3/30.6 (increased from yesterday) -PT/OT: Seen by PT and OT today.  Scheduled for another PT visit this afternoon. Will continue PT/OT visits -DVT ppx: 81mg  ASA -Pain control: Tylenol , Vicodin, Morphine , Lidocaine  patch, Methocarbamol  -ABX: none at this time -Additional medications: Senakot, Zofran , Hydralazine  -Disposition: Patient interested in being discharged to Skilled Nursing Facility for continued PT/OT prior to discharge home  Thank you to all members of patient's care team assisting in patient's treatment; nursing care team, social services/case management, hospitalist, and PT/OT   Lucie Stabs, PA-C Arlyss GEANNIE Schneider, DO [facility] Surgery [facility] [place]  12:28 PM 12/18/23

## 2023-12-18 NOTE — Plan of Care (Signed)
  Problem: Education: Goal: Ability to describe self-care measures that may prevent or decrease complications (Diabetes Survival Skills Education) will improve Outcome: Progressing   Problem: Coping: Goal: Ability to adjust to condition or change in health will improve Outcome: Progressing   Problem: Health Behavior/Discharge Planning: Goal: Ability to identify and utilize available resources and services will improve Outcome: Progressing Goal: Ability to manage health-related needs will improve Outcome: Progressing

## 2023-12-18 NOTE — Evaluation (Signed)
 Occupational Therapy Evaluation Patient Details Name: Rhonda Fields MRN: [id number] DOB: 21-Apr-1950 Today's Date: 12/18/2023   History of Present Illness   Pt is a 73 y/o F admitted on 12/16/23 after presenting with c/o fall, L hip pain. Pt found to have L intertrochanteric femur fx. Pt is s/p ORIF on 12/17/23. PMH: HTN, HLD, DM, depression with anxiety, CKD 2, IBS, HOH, HA, deficiency anemia     Clinical Impressions Pt was seen for OT evaluation and co-tx with PT this date. Prior to hospital admission, pt was independent. Pt lives with a friend. Pt presents with deficits in LLE strength, pain, balance, and significant fear of falling/pain and anxious with all aspects of mobility to the point she becomes shaky and had N/V intermittently during session, affecting safe and optimal ADL completion. Pt currently requires MOD A for bed mobility, MIN A +2 for STS and step pivot with RW. Anticipate pt will required MOD A for LB ADL and MIN A +2 for SPT to [facility] for toileting needs.  Pt educated in PLB and required repeated VC during session to utilize. Pt would benefit from skilled OT services to address noted impairments and functional limitations (see below for any additional details) in order to maximize safety and independence while minimizing future risk of falls, injury, and readmission. Anticipate the need for follow up OT services upon acute hospital DC.    If plan is discharge home, recommend the following:   A lot of help with walking and/or transfers;A lot of help with bathing/dressing/bathroom;Assistance with cooking/housework;Assist for transportation;Help with stairs or ramp for entrance     Functional Status Assessment   Patient has had a recent decline in their functional status and demonstrates the ability to make significant improvements in function in a reasonable and predictable amount of time.     Equipment Recommendations   Other (comment) (defer)      Recommendations for Other Services         Precautions/Restrictions   Precautions Precautions: Fall Restrictions Weight Bearing Restrictions Per Provider Order: Yes LLE Weight Bearing Per Provider Order: Weight bearing as tolerated     Mobility Bed Mobility Overal bed mobility: Needs Assistance Bed Mobility: Supine to Sit, Sit to Supine     Supine to sit: Mod assist, HOB elevated, Used rails Sit to supine: Mod assist   General bed mobility comments: significant time to complete    Transfers Overall transfer level: Needs assistance Equipment used: Rolling walker (2 wheels) Transfers: Sit to/from Stand, Bed to chair/wheelchair/BSC Sit to Stand: Min assist, +2 physical assistance, From elevated surface     Step pivot transfers: Min assist, +2 physical assistance     General transfer comment: sit>stand from elevated EOB with RW, pt pulling up on RW vs pushing to standing. Pt with increased pain in L hip during stand>sit, declined sitting in recliner.      Balance Overall balance assessment: Needs assistance Sitting-balance support: Feet supported Sitting balance-Leahy Scale: Fair     Standing balance support: During functional activity, Bilateral upper extremity supported, Reliant on assistive device for balance Standing balance-Leahy Scale: Poor                             ADL either performed or assessed with clinical judgement   ADL Overall ADL's : Needs assistance/impaired                 Upper Body Dressing : Sitting;Set up;Supervision/safety  Lower Body Dressing: Sit to/from stand;Moderate assistance   Toilet Transfer: Minimal assistance;BSC/3in1;Rolling walker (2 wheels);Cueing for sequencing;Cueing for safety Toilet Transfer Details (indicate cue type and reason): step pivot simulated, VC for sequencing         Functional mobility during ADLs: Minimal assistance;Rolling walker (2 wheels);+2 for physical assistance        Vision         Perception         Praxis         Pertinent Vitals/Pain Pain Assessment Pain Assessment: 0-10 Pain Score: 7  Pain Location: L hip with movement Pain Descriptors / Indicators: Discomfort, Grimacing, Guarding Pain Intervention(s): Limited activity within patient's tolerance, Monitored during session, Premedicated before session, Repositioned     Extremity/Trunk Assessment Upper Extremity Assessment Upper Extremity Assessment: Overall WFL for tasks assessed   Lower Extremity Assessment Lower Extremity Assessment: LLE deficits/detail LLE Deficits / Details: 2/5 knee extension in sitting LLE: Unable to fully assess due to pain       Communication Communication Communication: Impaired Factors Affecting Communication: Hearing impaired   Cognition Arousal: Alert Behavior During Therapy: Anxious                                 Following commands: Impaired Following commands impaired: Follows one step commands with increased time     Cueing  General Comments   Cueing Techniques: Verbal cues  N/V, pt reports 2/2 pain, requiring ongoing cues re: PLB and relaxation strategies 2/2 anxiety around mobility and fear of pain   Exercises Other Exercises Other Exercises: Pt edu in PLB, ADL transfer training, pain mgt   Shoulder Instructions      Home Living Family/patient expects to be discharged to:: Private residence Living Arrangements: Non-relatives/Friends Available Help at Discharge: Family;Available PRN/intermittently Type of Home: House Home Access: Stairs to enter [employer] of Steps: 2 Entrance Stairs-Rails: Right;Left Home Layout: One level     Bathroom Shower/Tub: Tub/shower unit         Home Equipment: Rollator (4 wheels);Rolling Walker (2 wheels);Cane - single point;Hand held shower head          Prior Functioning/Environment Prior Level of Function : Independent/Modified Independent;History of  Falls (last six months)             Mobility Comments: Ambulatory without AD, driving, 1 other fall in the past 6 months ADLs Comments: Independent    OT Problem List: Decreased strength;Pain;Decreased cognition;Decreased safety awareness;Impaired balance (sitting and/or standing);Decreased knowledge of use of DME or AE;Decreased range of motion;Decreased knowledge of precautions   OT Treatment/Interventions: Self-care/ADL training;Therapeutic exercise;Therapeutic activities;DME and/or AE instruction;Patient/family education;Balance training      OT Goals(Current goals can be found in the care plan section)   Acute Rehab OT Goals Patient Stated Goal: have less pain and go home OT Goal Formulation: With patient Time For Goal Achievement: 01/01/24 Potential to Achieve Goals: Good ADL Goals Pt Will Perform Lower Body Dressing: with min assist;sit to/from stand;with adaptive equipment Pt Will Transfer to Toilet: with supervision;ambulating;bedside commode (LRAD) Pt Will Perform Toileting - Clothing Manipulation and hygiene: with modified independence;sitting/lateral leans Additional ADL Goal #1: Pt will verbalize plan to implement at least 2 learned falls prevention strategies to maximize safety.   OT Frequency:  Min 2X/week    Co-evaluation PT/OT/SLP Co-Evaluation/Treatment: Yes Reason for Co-Treatment: For patient/therapist safety;To address functional/ADL transfers;Necessary to address cognition/behavior during functional activity PT goals addressed during  session: Mobility/safety with mobility;Balance;Proper use of DME OT goals addressed during session: ADL's and self-care;Proper use of Adaptive equipment and DME      AM-PAC OT 6 Clicks Daily Activity     Outcome Measure Help from another person eating meals?: None Help from another person taking care of personal grooming?: None Help from another person toileting, which includes using toliet, bedpan, or urinal?: A  Lot Help from another person bathing (including washing, rinsing, drying)?: A Lot Help from another person to put on and taking off regular upper body clothing?: A Little Help from another person to put on and taking off regular lower body clothing?: A Lot 6 Click Score: 17   End of Session Equipment Utilized During Treatment: Rolling walker (2 wheels) Nurse Communication: Mobility status;Patient requests pain meds  Activity Tolerance: Patient limited by pain Patient left: in bed;with call bell/phone within reach;with bed alarm set  OT Visit Diagnosis: Other abnormalities of gait and mobility (R26.89);History of falling (Z91.81);Muscle weakness (generalized) (M62.81);Pain Pain - Right/Left: Left Pain - part of body: Hip                Time: 1015-1046 OT Time Calculation (min): 31 min Charges:  OT General Charges $OT Visit: 1 Visit OT Evaluation $OT Eval Moderate Complexity: 1 Mod OT Treatments $Therapeutic Activity: 8-22 mins  Warren SAUNDERS., MPH, MS, OTR/L ascom [phone] 12/18/23, 1:31 PM

## 2023-12-18 NOTE — Evaluation (Signed)
 Physical Therapy Evaluation Patient Details Name: Rhonda Fields MRN: [id number] DOB: 11/08/1950 Today's Date: 12/18/2023  History of Present Illness  Pt is a 73 y/o F admitted on 12/16/23 after presenting with c/o fall, L hip pain. Pt found to have L intertrochanteric femur fx. Pt is s/p ORIF on 12/17/23. PMH: HTN, HLD, DM, depression with anxiety, CKD 2, IBS, HOH, HA, deficiency anemia  Clinical Impression  Pt seen for PT evaluation with pt agreeable, OT joining mid session. Pt reports prior to admission she was residing with friends in 1 level home with 2 steps to enter, ambulatory without AD, driving. On this date, pt performs LLE strengthening/ROM exercises with cuing re: technique. Pt is able to progress to standing & pivoting to chair but declines sitting in recliner - encouraged to attempt during PM session. Pt tolerates standing 5 minutes with BUE support on RW. Recommend ongoing PT services to progress mobility as able.        If plan is discharge home, recommend the following: A lot of help with walking and/or transfers;A lot of help with bathing/dressing/bathroom;Assist for transportation;Assistance with cooking/housework;Help with stairs or ramp for entrance   Can travel by private vehicle   No    Equipment Recommendations None recommended by PT  Recommendations for Other Services  Rehab consult    Functional Status Assessment Patient has had a recent decline in their functional status and demonstrates the ability to make significant improvements in function in a reasonable and predictable amount of time.     Precautions / Restrictions Precautions Precautions: Fall Restrictions Weight Bearing Restrictions Per Provider Order: Yes LLE Weight Bearing Per Provider Order: Weight bearing as tolerated      Mobility  Bed Mobility Overal bed mobility: Needs Assistance Bed Mobility: Supine to Sit     Supine to sit: Mod assist, HOB elevated, Used rails (exit R side  of bed, assisted pt with moving LLE to EOB, holds to bed rail & PT's hand to upright trunk, significantly extra time)          Transfers Overall transfer level: Needs assistance Equipment used: Rolling walker (2 wheels) Transfers: Sit to/from Stand, Bed to chair/wheelchair/BSC Sit to Stand: Min assist, +2 physical assistance, From elevated surface   Step pivot transfers: Min assist, +2 physical assistance (bed<>recliner with RW, pt able to scoot feet, does not fully clear feet with stepping but instead slides feet, min assist for RW management)       General transfer comment: sit>stand from elevated EOB with RW, pt pulling up on RW vs pushing to standing. Pt with increased pain in L hip during stand>sit, declined sitting in recliner.    Ambulation/Gait                  Stairs            Wheelchair Mobility     Tilt Bed    Modified Rankin (Stroke Patients Only)       Balance Overall balance assessment: Needs assistance Sitting-balance support: Feet supported Sitting balance-Leahy Scale: Fair     Standing balance support: During functional activity, Bilateral upper extremity supported, Reliant on assistive device for balance Standing balance-Leahy Scale: Poor                               Pertinent Vitals/Pain Pain Assessment Pain Assessment: 0-10 Pain Score: 7  Pain Location: L hip with movement Pain Descriptors / Indicators: Discomfort,  Grimacing, Guarding Pain Intervention(s): Monitored during session, Utilized relaxation techniques, Premedicated before session, Repositioned    Home Living Family/patient expects to be discharged to:: Private residence Living Arrangements: Non-relatives/Friends Available Help at Discharge: Family;Available PRN/intermittently Type of Home: House Home Access: Stairs to enter Entrance Stairs-Rails: Doctor, General Practice of Steps: 2   Home Layout: One level Home Equipment: Rollator (4  wheels);Rolling Walker (2 wheels);Cane - single point;Hand held shower head      Prior Function               Mobility Comments: Ambulatory without AD, driving, 1 other fall in the past 6 months ADLs Comments: Independent     Extremity/Trunk Assessment   Upper Extremity Assessment Upper Extremity Assessment: Overall WFL for tasks assessed    Lower Extremity Assessment Lower Extremity Assessment: LLE deficits/detail LLE Deficits / Details: 2/5 knee extension in sitting       Communication   Communication Communication: Impaired Factors Affecting Communication: Hearing impaired (does not have hearing aides here with her)    Cognition Arousal: Alert Behavior During Therapy: Anxious   PT - Cognitive impairments: No apparent impairments                         Following commands: Impaired Following commands impaired: Follows one step commands with increased time     Cueing Cueing Techniques: Verbal cues     General Comments General comments (skin integrity, edema, etc.): +nausea & vomiting, pt reports 2/2 pain, pt requires ongoing cuing re: pursed lip breathing & relaxation techniques as pt anxious re: mobility    Exercises Total Joint Exercises Ankle Circles/Pumps: PROM, Supine, 10 reps, Left Heel Slides: AAROM, Supine, Strengthening, Left, 10 reps Hip ABduction/ADduction: AAROM, Supine, Strengthening, Left, 10 reps Long Arc Quad: AROM, Seated, Strengthening, Left, 10 reps   Assessment/Plan    PT Assessment Patient needs continued PT services  PT Problem List Decreased strength;Pain;Decreased range of motion;Decreased activity tolerance;Decreased knowledge of use of DME;Decreased balance;Decreased safety awareness;Decreased knowledge of precautions;Decreased mobility       PT Treatment Interventions DME instruction;Balance training;Neuromuscular re-education;Gait training;Stair training;Functional mobility training;Patient/family  education;Therapeutic activities;Therapeutic exercise;Manual techniques    PT Goals (Current goals can be found in the Care Plan section)  Acute Rehab PT Goals Patient Stated Goal: decreased pain PT Goal Formulation: With patient Time For Goal Achievement: 01/01/24 Potential to Achieve Goals: Good    Frequency BID     Co-evaluation               AM-PAC PT 6 Clicks Mobility  Outcome Measure Help needed turning from your back to your side while in a flat bed without using bedrails?: A Little Help needed moving from lying on your back to sitting on the side of a flat bed without using bedrails?: A Lot Help needed moving to and from a bed to a chair (including a wheelchair)?: A Lot Help needed standing up from a chair using your arms (e.g., wheelchair or bedside chair)?: A Lot Help needed to walk in hospital room?: A Lot Help needed climbing 3-5 steps with a railing? : Total 6 Click Score: 12    End of Session   Activity Tolerance: Patient limited by pain Patient left: in bed;with call bell/phone within reach;with bed alarm set;with SCD's reapplied Nurse Communication: Mobility status (c/o pain) PT Visit Diagnosis: Muscle weakness (generalized) (M62.81);Pain;Difficulty in walking, not elsewhere classified (R26.2);Other abnormalities of gait and mobility (R26.89) Pain - Right/Left: Left Pain -  part of body: Hip    Time: 9044-8956 PT Time Calculation (min) (ACUTE ONLY): 48 min   Charges:   PT Evaluation $PT Eval Low Complexity: 1 Low PT Treatments $Therapeutic Activity: 23-37 mins PT General Charges $$ ACUTE PT VISIT: 1 Visit         Richerd Pinal, PT, DPT 12/18/23, 11:19 AM   Richerd CHRISTELLA Pinal 12/18/2023, 11:18 AM

## 2023-12-18 NOTE — Progress Notes (Addendum)
 Physical Therapy Treatment Patient Details Name: Rhonda Fields MRN: [id number] DOB: 1950-08-23 Today's Date: 12/18/2023   History of Present Illness Pt is a 73 y/o F admitted on 12/16/23 after presenting with c/o fall, L hip pain. Pt found to have L intertrochanteric femur fx. Pt is s/p ORIF on 12/17/23. PMH: HTN, HLD, DM, depression with anxiety, CKD 2, IBS, HOH, HA, deficiency anemia    PT Comments  Pt seen for PT evaluation with pt received in bed, agreeable to tx. Pt continues to require mod assist for bed mobility, cuing re: technique, use of hospital bed features. Pt transfers sit>stand from elevated EOB with min assist, decreased awareness of safe hand placement. Pt is able to step & turn to recliner, then takes steps forwards, eventually turning sideways to return to bed, reporting I'm done & declining to sit in recliner. PT educated pt on importance of OOB mobility & sitting in recliner with pt continuing to refuse. Will continue to follow pt acutely to progress mobility as able.    If plan is discharge home, recommend the following: A lot of help with walking and/or transfers;A lot of help with bathing/dressing/bathroom;Assist for transportation;Assistance with cooking/housework;Help with stairs or ramp for entrance   Can travel by private vehicle     No  Equipment Recommendations  None recommended by PT    Recommendations for Other Services Rehab consult     Precautions / Restrictions Precautions Precautions: Fall Restrictions Weight Bearing Restrictions Per Provider Order: Yes LLE Weight Bearing Per Provider Order: Weight bearing as tolerated     Mobility  Bed Mobility Overal bed mobility: Needs Assistance Bed Mobility: Supine to Sit     Supine to sit: Mod assist, HOB elevated, Used rails (exit R side of bed, cuing & assistance re: technique) Sit to supine: Mod assist (assistance to elevate BLE onto bed)        Transfers Overall transfer level: Needs  assistance Equipment used: Rolling walker (2 wheels) Transfers: Sit to/from Stand Sit to Stand: Min assist, From elevated surface   Step pivot transfers: Min assist, +2 physical assistance (bed<>recliner with RW, pt able to scoot feet, does not fully clear feet with stepping but instead slides feet, min assist for RW management)       General transfer comment: sit>Stand from elevated EOB with poor awareness re: hand placement    Ambulation/Gait Ambulation/Gait assistance: Contact guard assist Gait Distance (Feet): 3 Feet Assistive device: Rolling walker (2 wheels) Gait Pattern/deviations: Decreased step length - right, Decreased step length - left, Decreased stride length, Decreased dorsiflexion - left, Decreased weight shift to left Gait velocity: decreased         Stairs             Wheelchair Mobility     Tilt Bed    Modified Rankin (Stroke Patients Only)       Balance Overall balance assessment: Needs assistance Sitting-balance support: Feet supported Sitting balance-Leahy Scale: Fair     Standing balance support: During functional activity, Bilateral upper extremity supported, Reliant on assistive device for balance Standing balance-Leahy Scale: Poor                              Communication Communication Communication: Impaired Factors Affecting Communication: Hearing impaired (R ear best)  Cognition Arousal: Alert Behavior During Therapy: Anxious   PT - Cognitive impairments: No apparent impairments  PT - Cognition Comments: resistive to encouragement/education at times, perseverative on pain, anxious Following commands: Impaired Following commands impaired: Follows one step commands with increased time    Cueing Cueing Techniques: Verbal cues  Exercises Total Joint Exercises Ankle Circles/Pumps: PROM, Supine, 10 reps, Left Heel Slides: AAROM, Supine, Strengthening, Left, 10 reps Hip  ABduction/ADduction: AAROM, Supine, Strengthening, Left, 10 reps Long Arc Quad: AROM, Seated, Strengthening, Left, 10 reps    General Comments General comments (skin integrity, edema, etc.): N/V, pt reports 2/2 pain, requiring ongoing cues re: PLB and relaxation strategies 2/2 anxiety around mobility and fear of pain      Pertinent Vitals/Pain Pain Assessment Pain Assessment: Faces Pain Score: 7  Faces Pain Scale: Hurts whole lot Pain Location: L hip with movement Pain Descriptors / Indicators: Discomfort, Grimacing, Guarding, cramping Pain Intervention(s): Monitored during session, Limited activity within patient's tolerance, Repositioned, Premedicated before session, Utilized relaxation techniques    Home Living Family/patient expects to be discharged to:: Private residence Living Arrangements: Non-relatives/Friends Available Help at Discharge: Family;Available PRN/intermittently Type of Home: House Home Access: Stairs to enter Entrance Stairs-Rails: Doctor, General Practice of Steps: 2   Home Layout: One level Home Equipment: Rollator (4 wheels);Rolling Walker (2 wheels);Cane - single point;Hand held shower head      Prior Function            PT Goals (current goals can now be found in the care plan section) Acute Rehab PT Goals Patient Stated Goal: decreased pain PT Goal Formulation: With patient Time For Goal Achievement: 01/01/24 Potential to Achieve Goals: Good Progress towards PT goals: Progressing toward goals    Frequency    BID      PT Plan      Co-evaluation   Reason for Co-Treatment: For patient/therapist safety;To address functional/ADL transfers;Necessary to address cognition/behavior during functional activity PT goals addressed during session: Mobility/safety with mobility;Balance;Proper use of DME OT goals addressed during session: ADL's and self-care;Proper use of Adaptive equipment and DME      AM-PAC PT 6 Clicks Mobility    Outcome Measure  Help needed turning from your back to your side while in a flat bed without using bedrails?: A Little Help needed moving from lying on your back to sitting on the side of a flat bed without using bedrails?: A Lot Help needed moving to and from a bed to a chair (including a wheelchair)?: A Little Help needed standing up from a chair using your arms (e.g., wheelchair or bedside chair)?: A Little Help needed to walk in hospital room?: A Little Help needed climbing 3-5 steps with a railing? : Total 6 Click Score: 15    End of Session   Activity Tolerance: Patient limited by pain Patient left: in bed;with call bell/phone within reach;with bed alarm set;with SCD's reapplied Nurse Communication: Mobility status PT Visit Diagnosis: Muscle weakness (generalized) (M62.81);Pain;Difficulty in walking, not elsewhere classified (R26.2);Other abnormalities of gait and mobility (R26.89) Pain - Right/Left: Left Pain - part of body: Hip     Time: 8693-8670 PT Time Calculation (min) (ACUTE ONLY): 23 min  Charges:    $Therapeutic Activity: 23-37 mins PT General Charges $$ ACUTE PT VISIT: 1 Visit                     Richerd Pinal, PT, DPT 12/18/23, 1:38 PM   Richerd CHRISTELLA Pinal 12/18/2023, 1:36 PM

## 2023-12-19 DIAGNOSIS — S72002A Fracture of unspecified part of neck of left femur, initial encounter for closed fracture: Secondary | ICD-10-CM | POA: Diagnosis not present

## 2023-12-19 LAB — HEMOGLOBIN AND HEMATOCRIT, BLOOD
HCT: 30.5 % — ABNORMAL LOW (ref 36.0–46.0)
Hemoglobin: 10.1 g/dL — ABNORMAL LOW (ref 12.0–15.0)

## 2023-12-19 MED ORDER — HYDROCODONE-ACETAMINOPHEN 5-325 MG PO TABS
1.0000 | ORAL_TABLET | ORAL | Status: DC | PRN
  Administered 2023-12-19: 2 via ORAL
  Administered 2023-12-19: 1 via ORAL
  Administered 2023-12-20: 2 via ORAL
  Administered 2023-12-20 – 2023-12-21 (×4): 1 via ORAL
  Filled 2023-12-19 (×4): qty 1
  Filled 2023-12-19: qty 2
  Filled 2023-12-19 (×3): qty 1

## 2023-12-19 MED ORDER — ASPIRIN 81 MG PO TBEC: 81.0000 mg | DELAYED_RELEASE_TABLET | Freq: Two times a day (BID) | ORAL | 0 refills | Status: DC

## 2023-12-19 MED ORDER — ASPIRIN 81 MG PO TBEC: 81.0000 mg | DELAYED_RELEASE_TABLET | Freq: Two times a day (BID) | ORAL | Status: AC

## 2023-12-19 MED ORDER — ALUM & MAG HYDROXIDE-SIMETH 200-200-20 MG/5ML PO SUSP
30.0000 mL | ORAL | Status: DC | PRN
  Administered 2023-12-19 – 2023-12-21 (×3): 30 mL via ORAL
  Filled 2023-12-19 (×2): qty 30

## 2023-12-19 NOTE — Progress Notes (Signed)
 PROGRESS NOTE    Rhonda Fields   FMW:[id number] DOB: [birth year]  DOA: 12/16/2023 Date of Service: 12/19/23 which is hospital day 3  PCP: Duanne Butler DASEN, MD    Hospital course / significant events:   HPI: Rhonda Fields is a 73 y.o. female with medical history significant of HTN, HLD, DM, depression with anxiety, CKD-2, IBS, hard of hearing, headache, deficiency anemia, who presents with fall, left hip pain. Patient states that she had leg cramps when she was in gas station bathroom, and fell, injured her left hip   11/27: admitted to hospitalist w/ ortho consult for L hip fx 11/28: to OR. 11/29: PT/OT recs for SNF, TOC to start process  11/30: stable, await SNF      Consultants:  Orthopedic surgery   Procedures/Surgeries: 12/17/23 L hip fracture repair -  ORIF left intertrochanteric femur fracture with cephalomedually nailing       ASSESSMENT & PLAN:   Comminuted intertrochanteric fractures of the left femur with mildly displaced lesser trochanteric fragment and mild impaction of fracture fragments. . S/p IM nail to L femur 12/17/23 Ortho following WBAT LLE Pain control - dc IV morphine  today, increased Hydrocodone  PT/OT as able DVT Ppx per ortho w/ ASA 81 bid  Follow up ortho outpatient  SNF rehab pending placement   Anxiety/panic Pt reports panicked when working w/ therapy but willing to keep trying to engage w/ PT/OT as able Xanax  prn   Essential HTN IV hydralazine  as needed Home losartan    HLD statin  AKI - resolved - Cr 1.22 on admission 11/27 --> 0.85 11/28 Metabolic acidosis / lactic acidosis - resolved - CO2 19 on admission 11/27 --> 23 11/28, lactate 2.5 --> 1.9 Po hydration Monitor BMP  DM2 non insulin  dependent Last A1C indicates good control at 6.1 Holding metformin  and actos  for now Holding on insulin  unless significant hyperglycemia Monitor Glc periodically   Leukocytosis  Likely reactive w/ injury Follow  CBC  CAD/DM2 Held ASA preop, can resume now at bid per ortho for DVT ppx   Chronic back pain Chronic opiate use  Continue home hydrocodone  + pain control w/ hip fx   Anxiety/Depression Continue fluoxetine  20 mg daily  Xanax  added prn   Urinary frequency Continue oxybutinin  GERD Continue PPI  overweight based on BMI: Body mass index is 26.29 kg/m.SABRA Significantly low or high BMI is associated with higher medical risk.  Underweight - under 18  overweight - 25 to 29 obese - 30 or more Class 1 obesity: BMI of 30.0 to 34 Class 2 obesity: BMI of 35.0 to 39 Class 3 obesity: BMI of 40.0 to 49 Super Morbid Obesity: BMI 50-59 Super-super Morbid Obesity: BMI 60+ Healthy nutrition and physical activity advised as adjunct to other disease management and risk reduction treatments    DVT prophylaxis: ASA IV fluids: no continuous IV fluids  Nutrition: carb modified   Central lines / other devices: none  Code Status: FULL CODE ACP documentation reviewed:  none on file in [facility]  TOC needs: SNF rehab Medical barriers to dispo: none             Subjective / Brief ROS:  Patient reports anxious when working w/ PT on sitting in chair but better now Denies CP/SOB. Pain controlled.  Denies new weakness.    Family Communication: none at this time     Objective Findings:  Vitals:   12/18/23 2257 12/19/23 0310 12/19/23 0314 12/19/23 0815  BP: 134/78 (!) 140/86 134/80 ROLLEN)  143/80  Pulse: (!) 107 (!) 111 (!) 110 87  Resp:  17 19 20   Temp: 98.7 F (37.1 C) 98.2 F (36.8 C) 98 F (36.7 C) 97.8 F (36.6 C)  TempSrc:      SpO2: 95% 100% 98% 98%  Weight:      Height:        Intake/Output Summary (Last 24 hours) at 12/19/2023 1304 Last data filed at 12/19/2023 0900 Gross per 24 hour  Intake 300 ml  Output 600 ml  Net -300 ml   Filed Weights   12/16/23 1546 12/16/23 2300 12/17/23 1238  Weight: 62.1 kg 65.2 kg 61.7 kg    Examination:  Physical  Exam Constitutional:      General: She is not in acute distress. Cardiovascular:     Rate and Rhythm: Normal rate and regular rhythm.  Pulmonary:     Effort: Pulmonary effort is normal.     Breath sounds: Normal breath sounds.  Musculoskeletal:     Right lower leg: No edema.     Left lower leg: No edema.  Skin:    General: Skin is warm and dry.  Neurological:     Mental Status: She is alert. Mental status is at baseline.  Psychiatric:        Mood and Affect: Mood normal.        Behavior: Behavior normal.          Scheduled Medications:   aspirin  EC  81 mg Oral BID   atorvastatin   40 mg Oral Daily   Chlorhexidine  Gluconate Cloth  6 each Topical Daily   ferrous sulfate   325 mg Oral Daily   FLUoxetine   20 mg Oral Daily   lidocaine   1 patch Transdermal Q24H   losartan   25 mg Oral Daily   metFORMIN   1,000 mg Oral BID WC   oxybutynin   5 mg Oral QID   pantoprazole   40 mg Oral BID   polyethylene glycol  17 g Oral Daily    Continuous Infusions:    PRN Medications:  acetaminophen , ALPRAZolam , bisacodyl , fluticasone , hydrALAZINE , HYDROcodone -acetaminophen , methocarbamol , ondansetron  (ZOFRAN ) IV, mouth rinse, senna-docusate  Antimicrobials from admission:  Anti-infectives (From admission, onward)    Start     Dose/Rate Route Frequency Ordered Stop   12/17/23 2200  ceFAZolin  (ANCEF ) IVPB 2g/100 mL premix        2 g 200 mL/hr over 30 Minutes Intravenous Every 8 hours 12/17/23 1508 12/18/23 0554   12/17/23 1300  ceFAZolin  (ANCEF ) IVPB 2g/100 mL premix        2 g 200 mL/hr over 30 Minutes Intravenous 30 min pre-op 12/16/23 1921 12/17/23 1315           Data Reviewed:  I have personally reviewed the following...  CBC: Recent Labs  Lab 12/16/23 1602 12/17/23 0509 12/17/23 2317 12/18/23 0514 12/18/23 1330 12/19/23 0956  WBC 13.7* 15.4*  --  15.0*  --   --   NEUTROABS 9.4*  --   --   --   --   --   HGB 11.3* 11.4* 9.8* 10.3* 10.8* 10.1*  HCT 34.7* 34.0*  28.7* 30.6* 31.7* 30.5*  MCV 91.6 88.8  --  89.2  --   --   PLT 324 294  --  237  --   --    Basic Metabolic Panel: Recent Labs  Lab 12/16/23 1602 12/17/23 0509 12/18/23 0514  NA 138 138 137  K 4.4 4.4 4.4  CL 106 103 104  CO2 19*  23 24  GLUCOSE 132* 122* 134*  BUN 19 14 11   CREATININE 1.22* 0.85 0.90  CALCIUM  8.6* 8.6* 8.4*   GFR: Estimated Creatinine Clearance: 48.8 mL/min (by C-G formula based on SCr of 0.9 mg/dL). Liver Function Tests: Recent Labs  Lab 12/16/23 1602  AST 24  ALT 14  ALKPHOS 74  BILITOT 0.2  PROT 6.8  ALBUMIN 3.9   No results for input(s): LIPASE, AMYLASE in the last 168 hours. No results for input(s): AMMONIA in the last 168 hours. Coagulation Profile: Recent Labs  Lab 12/16/23 1602  INR 1.0   Cardiac Enzymes: No results for input(s): CKTOTAL, CKMB, CKMBINDEX, TROPONINI in the last 168 hours. BNP (last 3 results) No results for input(s): PROBNP in the last 8760 hours. HbA1C: No results for input(s): HGBA1C in the last 72 hours. CBG: Recent Labs  Lab 12/17/23 1519 12/17/23 1705 12/17/23 2131 12/18/23 0743 12/18/23 1141  GLUCAP 130* 166* 225* 113* 153*   Lipid Profile: No results for input(s): CHOL, HDL, LDLCALC, TRIG, CHOLHDL, LDLDIRECT in the last 72 hours. Thyroid Function Tests: No results for input(s): TSH, T4TOTAL, FREET4, T3FREE, THYROIDAB in the last 72 hours. Anemia Panel: No results for input(s): VITAMINB12, FOLATE, FERRITIN, TIBC, IRON , RETICCTPCT in the last 72 hours. Most Recent Urinalysis On File:     Component Value Date/Time   COLORURINE STRAW (A) 10/19/2022 0200   APPEARANCEUR CLEAR (A) 10/19/2022 0200   LABSPEC 1.005 10/19/2022 0200   PHURINE 6.0 10/19/2022 0200   GLUCOSEU NEGATIVE 10/19/2022 0200   HGBUR NEGATIVE 10/19/2022 0200   BILIRUBINUR NEGATIVE 10/19/2022 0200   KETONESUR NEGATIVE 10/19/2022 0200   PROTEINUR NEGATIVE 10/19/2022 0200   NITRITE  NEGATIVE 10/19/2022 0200   LEUKOCYTESUR NEGATIVE 10/19/2022 0200   Sepsis Labs: @LABRCNTIP (procalcitonin:4,lacticidven:4) Microbiology: No results found for this or any previous visit (from the past 240 hours).    Radiology Studies last 3 days: DG HIP UNILAT W OR W/O PELVIS 2-3 VIEWS LEFT Result Date: 12/17/2023 EXAM: 2 OR MORE VIEW(S) XRAY OF THE PELVIS AND LEFT HIP 12/17/2023 03:35:00 PM COMPARISON: 12/16/2023 CLINICAL HISTORY: Post-operative state FINDINGS: BONES AND JOINTS: SI joints are symmetric. Interval IM nail fixation of previously seen left intertrochanteric fracture. Remote right femoral ORIF. Bilateral hips demonstrate normal alignment. SOFT TISSUES: Expected postoperative changes including left hip subcutaneous air. IMPRESSION: 1. Interval IM nail fixation of previously seen left intertrochanteric fracture. Electronically signed by: Norman Gatlin MD 12/17/2023 07:23 PM EST RP Workstation: [id number]   DG HIP UNILAT WITH PELVIS 2-3 VIEWS LEFT Result Date: 12/17/2023 CLINICAL DATA:  Left femoral intramedullary nail placement EXAM: DG HIP (WITH OR WITHOUT PELVIS) 3V LEFT COMPARISON:  Left hip radiograph dated 12/16/2023 FINDINGS: Three fluoroscopic images obtained during left femoral intramedullary nail placement. 2 minutes 57 seconds fluoro time utilized. Radiation dose 16.015 mGy Kerma. Please see performing physicians operative report for full details. IMPRESSION: Fluoroscopic images were obtained for intraoperative guidance of left femoral intramedullary nail placement. Electronically Signed   By: Limin  Xu M.D.   On: 12/17/2023 19:08   DG C-Arm 1-60 Min-No Report Result Date: 12/17/2023 Fluoroscopy was utilized by the requesting physician.  No radiographic interpretation.   DG C-Arm 1-60 Min-No Report Result Date: 12/17/2023 Fluoroscopy was utilized by the requesting physician.  No radiographic interpretation.   CT Hip Left Wo Contrast Result Date: 12/16/2023 EXAM: CT OF  THE LEFT HIP WITHOUT IV CONTRAST 12/16/2023 06:28:04 PM TECHNIQUE: CT of the left hip was performed without the administration of intravenous contrast. Multiplanar reformatted  images are provided for review. Automated exposure control, iterative reconstruction, and/or weight based adjustment of the mA/kV was utilized to reduce the radiation dose to as low as reasonably achievable. COMPARISON: Comparison with left hip radiographs 12/16/2023. CLINICAL HISTORY: Hip trauma, fracture suspected, xray done. FINDINGS: BONES: Comminuted intertrochanteric fractures of the femur left hip with mildly displaced lesser trochanteric fragment and mild impaction of fracture fragments. No dislocation at the hip joint. Visualized pubic rami and left hemipelvis appear intact. No aggressive appearing osseous abnormality or periostitis. SOFT TISSUE: No significant soft tissue edema or fluid collections. No soft tissue mass. JOINT: No significant degenerative changes. No osseous erosions. INTRAPELVIC CONTENTS: Limited images of the intrapelvic contents are unremarkable. IMPRESSION: 1. Comminuted intertrochanteric fractures of the left femur with mildly displaced lesser trochanteric fragment and mild impaction of fracture fragments. No dislocation at the hip joint. Electronically signed by: Elsie Gravely MD 12/16/2023 06:33 PM EST RP Workstation: [id number]   DG Hip Unilat With Pelvis 2-3 Views Left Result Date: 12/16/2023 EXAM: 2 or 3 VIEW(S) XRAY OF THE LEFT HIP 12/16/2023 05:19:00 PM COMPARISON: None available. CLINICAL HISTORY: Pain after a fall. FINDINGS: BONES AND JOINTS: The left hip is somewhat rotated. Skin folds overlying limit examination, but there appears to be a persistent linear lucency along the base of the Femoral Neck and extending to the Greater Trochanteric Region with a slight cortical step-off. This suggests a nondisplaced fracture. CT is suggested for correlation and confirmation. The hip joint is maintained.  No significant degenerative changes. Contralateral right hip demonstrates postoperative fixation, incompletely visualized. The pelvis and sacrum appear intact. The Sacroiliac Joints and Symphysis Pubis are not displaced. SOFT TISSUES: Skin folds overlying limit examination. Vascular calcifications. IMPRESSION: 1. Possible nondisplaced fracture of the left femoral neck extending to the greater trochanteric region with slight cortical step-off. Recommend CT for confirmation. 2. Postoperative fixation in the contralateral right hip, incompletely visualized. Electronically signed by: Elsie Gravely MD 12/16/2023 05:29 PM EST RP Workstation: [id number]   CT Head Wo Contrast Result Date: 12/16/2023 CLINICAL DATA:  Fall. EXAM: CT HEAD WITHOUT CONTRAST CT CERVICAL SPINE WITHOUT CONTRAST TECHNIQUE: Multidetector CT imaging of the head and cervical spine was performed following the standard protocol without intravenous contrast. Multiplanar CT image reconstructions of the cervical spine were also generated. RADIATION DOSE REDUCTION: This exam was performed according to the departmental dose-optimization program which includes automated exposure control, adjustment of the mA and/or kV according to patient size and/or use of iterative reconstruction technique. COMPARISON:  Head and cervical CT, 09/22/2022 FINDINGS: CT HEAD FINDINGS Brain: No evidence of acute infarction, hemorrhage, hydrocephalus, extra-axial collection or mass lesion/mass effect. Vascular: No hyperdense vessel or unexpected calcification. Skull: Normal. Negative for fracture or focal lesion. Sinuses/Orbits: Globes and orbits are unremarkable. Sinuses are essentially clear. Other: None. CT CERVICAL SPINE FINDINGS Alignment: Slight degenerative anterolisthesis of C4 on C5. No other malalignment. Skull base and vertebrae: No acute fracture. No primary bone lesion or focal pathologic process. Soft tissues and spinal canal: No prevertebral fluid or swelling.  No visible canal hematoma. Disc levels: Mild loss of disc height at C5-C6 with moderate loss of disc height at C6-C7. Facet degenerative change noted bilaterally most evident along the mid cervical spine. No significant disc bulging or convincing disc herniation. Upper chest: No acute findings. Other: None. IMPRESSION: HEAD CT 1. No acute intracranial abnormalities. CERVICAL CT 1. No fracture or acute finding. Electronically Signed   By: Alm Parkins M.D.   On: 12/16/2023 16:59   CT  Cervical Spine Wo Contrast Result Date: 12/16/2023 CLINICAL DATA:  Fall. EXAM: CT HEAD WITHOUT CONTRAST CT CERVICAL SPINE WITHOUT CONTRAST TECHNIQUE: Multidetector CT imaging of the head and cervical spine was performed following the standard protocol without intravenous contrast. Multiplanar CT image reconstructions of the cervical spine were also generated. RADIATION DOSE REDUCTION: This exam was performed according to the departmental dose-optimization program which includes automated exposure control, adjustment of the mA and/or kV according to patient size and/or use of iterative reconstruction technique. COMPARISON:  Head and cervical CT, 09/22/2022 FINDINGS: CT HEAD FINDINGS Brain: No evidence of acute infarction, hemorrhage, hydrocephalus, extra-axial collection or mass lesion/mass effect. Vascular: No hyperdense vessel or unexpected calcification. Skull: Normal. Negative for fracture or focal lesion. Sinuses/Orbits: Globes and orbits are unremarkable. Sinuses are essentially clear. Other: None. CT CERVICAL SPINE FINDINGS Alignment: Slight degenerative anterolisthesis of C4 on C5. No other malalignment. Skull base and vertebrae: No acute fracture. No primary bone lesion or focal pathologic process. Soft tissues and spinal canal: No prevertebral fluid or swelling. No visible canal hematoma. Disc levels: Mild loss of disc height at C5-C6 with moderate loss of disc height at C6-C7. Facet degenerative change noted bilaterally  most evident along the mid cervical spine. No significant disc bulging or convincing disc herniation. Upper chest: No acute findings. Other: None. IMPRESSION: HEAD CT 1. No acute intracranial abnormalities. CERVICAL CT 1. No fracture or acute finding. Electronically Signed   By: Alm Parkins M.D.   On: 12/16/2023 16:59         Marolyn Urschel, DO [facility] 12/19/2023, 1:04 PM    Dictation software may have been used to generate the above note. Typos may occur and escape review in typed/dictated notes. Please contact Dr Marsa directly for clarity if needed.  Staff may message me via secure chat in Epic  but this may not receive an immediate response,  please page me for urgent matters!  If 7PM-7AM, please contact night coverage [email or web address]

## 2023-12-19 NOTE — Discharge Instructions (Signed)
 Orthopedic Surgery Discharge Instructions  Patient name: TAKAYA HYSLOP Fracture: Left intertrochanteric femur fracture Procedure Performed: Left hip cephalomedullary nail Date of Surgery: 12/17/2023 Surgeon: Arlyss Schneider, DO  Activity: You are allowed to put as much weight on your leg as you would like. You can walk as much as you would like. You can perform household activities such as cleaning dishes, doing laundry, vacuuming, etc.  Incision Care: Your incision site has a dressing over it. That dressing should remain in place and dry at all times for a total of one week after surgery. After one week, you can remove the dressing. Underneath the dressing, you will find skin staples or stitches. You should leave these in place. They will be taken out in the office when the wound has healed. Do not pick, rub, or scrub at them. Do not put cream or lotion over the surgical area. After one week and once the dressing is off, it is okay to let soap and water run over your incision. Again, do not pick, scrub, or rub at the staples when bathing. Do not submerge (e.g., take a bath, swim, go in a hot tub, etc.) until six weeks after surgery. There may be some bloody drainage from the incision into the dressing after surgery. This is normal. You do not need to replace the dressing. Continue to leave it in place for the one week as instructed above. Should the dressing become saturated with blood or drainage, please call the office for further instructions.   Medications: Please take your pain medication as previously prescribed.  You have been prescribed aspirin  as a blood thinner. This medication is to be taken to prevent blood clots. Take 81 milligrams twice daily. You should refrain from using other blood thinners (warfarin, apixaban, plavix, xarelto , etc.) while using the aspirin . You will need to take this medication for a total of 6 weeks after your surgery.   You should not use over-the-counter  NSAIDs (ibuprofen , Aleve, Celebrex, naproxen, meloxicam, etc.) for pain relief because aspirin  is a similar medication. There can be side effects including but not limited to kidney injury and ulcers if you take these type of medications with the aspirin .   Diet: You should be safe to resume your regular diet after surgery.   Reasons to Call the Office After Surgery: You should feel free to call the office with any concerns or questions you have in the post-operative period, but you should definitely notify the office or go to the nearest Emergency Department if you develop: -shortness of breath, chest pain, or trouble breathing -excessive bleeding, drainage, redness, or swelling around the surgical site -fevers, chills, or pain that is getting worse with each passing day -persistent nausea or vomiting -new weakness in any extremity, new or worsening numbness or tingling in any extremity -numbness in the groin, bowel or bladder incontinence -other concerns about your surgery  Follow Up Appointments: You should have an office appointment scheduled for approximately two weeks after surgery. If you do not remember when this appointment is or do not already have it scheduled, please call the office to schedule.   Office Information:  -Arlyss Schneider, DO -Phone number: [phone] -Address: [address], [place] [address]

## 2023-12-19 NOTE — Progress Notes (Signed)
 Physical Therapy Treatment Patient Details Name: Rhonda Fields MRN: [id number] DOB: February 20, 1950 Today's Date: 12/19/2023   History of Present Illness Pt is a 73 y/o F admitted on 12/16/23 after presenting with c/o fall, L hip pain. Pt found to have L intertrochanteric femur fx. Pt is s/p ORIF on 12/17/23. PMH: HTN, HLD, DM, depression with anxiety, CKD 2, IBS, HOH, HA, deficiency anemia    PT Comments  Pt in bed.  Agrees to session but flatly refuses any attempt to get to recliner.  I'm not doing it  She does need to use BSC to void and for small loose BM, large amt of gas.  She is able to get to EOB with mod a x 1 and bed features.  Generally steady in sitting where she stands to RW with inc time and min a x 1 and pivots on foot to [facility] then back to bed after assist for care as she cannot let go of walker handles for her own care.  She does stand an additional time for pre-gait AROM and lateral scoot in standing to reposition.  Self limiting despite risks/benefits for OOB.  Pt does have some inc fear which is playing into her mobility.  Encouragement given.  Returned to supine with mod a x 1 for LE assist and repositioned for comfort.     If plan is discharge home, recommend the following: A lot of help with walking and/or transfers;A lot of help with bathing/dressing/bathroom;Assist for transportation;Assistance with cooking/housework;Help with stairs or ramp for entrance   Can travel by private vehicle        Equipment Recommendations  None recommended by PT    Recommendations for Other Services       Precautions / Restrictions Precautions Precautions: Fall Restrictions Weight Bearing Restrictions Per Provider Order: Yes LLE Weight Bearing Per Provider Order: Weight bearing as tolerated     Mobility  Bed Mobility Overal bed mobility: Needs Assistance Bed Mobility: Supine to Sit, Sit to Supine     Supine to sit: Mod assist, HOB elevated, Used rails Sit to supine: Min  assist, Mod assist     Patient Response: Cooperative, Anxious  Transfers Overall transfer level: Needs assistance Equipment used: Rolling walker (2 wheels) Transfers: Sit to/from Stand Sit to Stand: Min assist, From elevated surface                Ambulation/Gait Ambulation/Gait assistance: Contact guard assist, Min assist Gait Distance (Feet): 3 Feet Assistive device: Rolling walker (2 wheels) Gait Pattern/deviations: Decreased step length - right, Decreased step length - left, Decreased stride length, Decreased dorsiflexion - left, Decreased weight shift to left Gait velocity: decreased     General Gait Details: more pivots than steps to [facility] and back to bed   Stairs             Wheelchair Mobility     Tilt Bed Tilt Bed Patient Response: Cooperative, Anxious  Modified Rankin (Stroke Patients Only)       Balance Overall balance assessment: Needs assistance Sitting-balance support: Feet supported Sitting balance-Leahy Scale: Fair     Standing balance support: During functional activity, Bilateral upper extremity supported, Reliant on assistive device for balance Standing balance-Leahy Scale: Poor Standing balance comment: heavy reliance on RW for support                            Communication Communication Communication: Impaired Factors Affecting Communication: Hearing impaired  Cognition  Arousal: Alert Behavior During Therapy: Anxious   PT - Cognitive impairments: No apparent impairments                         Following commands: Impaired Following commands impaired: Follows one step commands with increased time    Cueing Cueing Techniques: Verbal cues  Exercises Other Exercises Other Exercises: standig pre-gait and AROM LLE with RW support and min a x 1 , seated AROM    General Comments        Pertinent Vitals/Pain Pain Assessment Pain Assessment: Faces Faces Pain Scale: Hurts little more Pain Location: L  hip with movement Pain Descriptors / Indicators: Cramping, Sore Pain Intervention(s): Limited activity within patient's tolerance, Monitored during session, Repositioned    Home Living                          Prior Function            PT Goals (current goals can now be found in the care plan section) Progress towards PT goals: Progressing toward goals    Frequency    BID      PT Plan      Co-evaluation              AM-PAC PT 6 Clicks Mobility   Outcome Measure  Help needed turning from your back to your side while in a flat bed without using bedrails?: A Little Help needed moving from lying on your back to sitting on the side of a flat bed without using bedrails?: A Lot Help needed moving to and from a bed to a chair (including a wheelchair)?: A Little Help needed standing up from a chair using your arms (e.g., wheelchair or bedside chair)?: A Little Help needed to walk in hospital room?: A Little Help needed climbing 3-5 steps with a railing? : Total 6 Click Score: 15    End of Session Equipment Utilized During Treatment: Gait belt Activity Tolerance: Patient limited by pain Patient left: in bed;with call bell/phone within reach;with bed alarm set;with family/visitor present Nurse Communication: Mobility status PT Visit Diagnosis: Muscle weakness (generalized) (M62.81);Pain;Difficulty in walking, not elsewhere classified (R26.2);Other abnormalities of gait and mobility (R26.89) Pain - Right/Left: Left Pain - part of body: Hip     Time: 9095-9069 PT Time Calculation (min) (ACUTE ONLY): 26 min  Charges:    $Therapeutic Exercise: 8-22 mins $Therapeutic Activity: 8-22 mins PT General Charges $$ ACUTE PT VISIT: 1 Visit                   Lauraine Gills, PTA 12/19/23, 10:19 AM

## 2023-12-19 NOTE — Progress Notes (Signed)
   12/19/23 0310  Assess: MEWS Score  Temp 98.2 F (36.8 C)  BP (!) 140/86  MAP (mmHg) 103  Pulse Rate (!) 111  Resp 17  SpO2 100 %  O2 Device Room Air  Patient Activity (if Appropriate) In bed  Assess: MEWS Score  MEWS Temp 0  MEWS Systolic 0  MEWS Pulse 2  MEWS RR 0  MEWS LOC 0  MEWS Score 2  MEWS Score Color Yellow  Assess: if the MEWS score is Yellow or Red  Were vital signs accurate and taken at a resting state? No, vital signs rechecked  Assess: SIRS CRITERIA  SIRS Temperature  0  SIRS Respirations  0  SIRS Pulse 1  SIRS WBC 0  SIRS Score Sum  1   Patient returned to bed from using bathroom. Heart rate improved after lying down.

## 2023-12-19 NOTE — Plan of Care (Signed)

## 2023-12-19 NOTE — Care Management Important Message (Signed)
 Important Message  Patient Details  Name: Rhonda Fields MRN: [id number] Date of Birth: 02-11-1950   Important Message Given:  Yes - Medicare IM     Silvanna Ohmer W, CMA 12/19/2023, 1:55 PM

## 2023-12-19 NOTE — Progress Notes (Signed)
 [facility] Post-Op Note   Patient name: Rhonda Fields MRN: [id number] DOB: 1950/08/21 Date: 12/19/2023  2 Days Post-Op s/p ORIF left intertrochanteric femur fracture with cephalomedullary nailing  Subjective:  No acute events overnight. Patient resting comfortably in bed this morning. Denies new or worsening pain.  Patient does admit to some discomfort over the lateral left thigh.  Denies numbness/tingling. Denies systemic signs of illness including fever, chills, nausea, vomiting, chest pain, shortness of breath.  Admits to passing gas but no bowel movement yet.  Patient reports she worked with physical and occupational therapy yesterday.  She does admit to having some apprehension sitting in a chair but is willing to continue working to make progress after surgery.  Vitals:   12/19/23 0314 12/19/23 0815  BP: 134/80 (!) 143/80  Pulse: (!) 110 87  Resp: 19 20  Temp: 98 F (36.7 C) 97.8 F (36.6 C)  SpO2: 98% 98%    Physical Exam:  GEN: NAD, alert, awake, follows commands for exam.  Hypertensive, otherwise vital signs stable.  LLE: Compartments soft and compressible Dressing clean, dry and intact. Mild ecchymosis. Some discomfort with movement of the left hip-primarily over the lateral region Palpable dorsalis pedis pulse Brisk cap refill x 5 toes Sensation intact to light touch to Deep peroneal/Superficial peroneal/Tibial/Saphenous/Sural nerve distributions Motor function intact to EHL/FHL/Tibialis anterior/Gastrocnemius    Imaging: Postoperative radiographs of the left hip reviewed which demonstrates appropriate intramedullary nail fixation.  No new peri-implant fractures.   Assessment: 2 Days Post-Op s/p ORIF left intertrochanteric femur fracture with cephalomedullary nailing  Plan: Patient doing well in the postoperative period.  Her pain is manageable with medication.  We would like patient to continue working with PT/OT while in the  hospital.  Anticipate discharge to nursing facility when medically stable and arrangements have been established.  -Weightbearing status: Weightbearing as tolerated left lower extremity -Monitor H/H: Pending morning values -PT/OT: As able -DVT ppx: 81mg  ASA twice daily -Pain control: Multimodal-wean to oral as able -ABX: Postoperative antibiotics complete -Disposition: Patient will likely require discharge to nursing facility.  Appreciate transition of care team facilitation.  She will continue working with PT/OT.  No further acute orthopedic intervention planned.  Will plan on seeing patient back in 2 weeks in office.  Discharge instructions and DVT prophylaxis medication provided in chart.  Thank you to all members of patient's care team assisting in patient's treatment; nursing care team, social services/case management, hospitalist, and PT/OT   Please call with any questions or concerns  Arlyss Schneider, DO [facility]

## 2023-12-20 DIAGNOSIS — S72002A Fracture of unspecified part of neck of left femur, initial encounter for closed fracture: Secondary | ICD-10-CM | POA: Diagnosis not present

## 2023-12-20 NOTE — TOC Progression Note (Signed)
 Transition of Care [facility]) - Progression Note    Patient Details  Name: Rhonda Fields MRN: [id number] Date of Birth: 07-Sep-1950  Transition of Care [facility]) CM/SW Contact  Alvaro Louder, [place] Phone Number: 12/20/2023, 4:23 PM  Clinical Narrative:   Patient selected SNF [facility]. TOC to start insurance auth for patient to admit to [facility] when medically ready.   TOC to follow for discharge      Barriers to Discharge: Continued Medical Work up               Expected Discharge Plan and Services   Discharge Planning Services: CM Consult                                           Social Drivers of Health (SDOH) Interventions SDOH Screenings   Food Insecurity: No Food Insecurity (12/17/2023)  Housing: Low Risk  (12/17/2023)  Transportation Needs: Unmet Transportation Needs (12/17/2023)  Utilities: At Risk (12/17/2023)  Alcohol Screen: Low Risk  (06/02/2023)  Depression (PHQ2-9): Low Risk  (06/02/2023)  Financial Resource Strain: Low Risk  (06/02/2023)  Physical Activity: Inactive (06/02/2023)  Social Connections: Socially Isolated (12/17/2023)  Stress: No Stress Concern Present (06/02/2023)  Tobacco Use: Medium Risk (10/28/2023)  Health Literacy: Adequate Health Literacy (06/02/2023)    Readmission Risk Interventions     No data to display

## 2023-12-20 NOTE — Anesthesia Postprocedure Evaluation (Addendum)
 Anesthesia Post Note  Patient: Rhonda Fields  Procedure(s) Performed: FIXATION, FRACTURE, INTERTROCHANTERIC, WITH INTRAMEDULLARY ROD (Left)  Patient location during evaluation: PACU Anesthesia Type: General Level of consciousness: awake and alert Pain management: pain level controlled Vital Signs Assessment: post-procedure vital signs reviewed and stable Respiratory status: spontaneous breathing, nonlabored ventilation, respiratory function stable and patient connected to nasal cannula oxygen Cardiovascular status: blood pressure returned to baseline and stable Postop Assessment: no apparent nausea or vomiting Anesthetic complications: no   No notable events documented.   Last Vitals:  Vitals:   12/20/23 0438 12/20/23 0933  BP: 138/75 120/70  Pulse: 97 (!) 110  Resp: 17 18  Temp: 36.9 C 36.8 C  SpO2: 97% 94%    Last Pain:  Vitals:   12/20/23 1333  TempSrc:   PainSc: 8                  Camellia Merilee Louder

## 2023-12-20 NOTE — NC FL2 (Signed)
 [place]  MEDICAID FL2 LEVEL OF CARE FORM     IDENTIFICATION  Patient Name: Rhonda Fields Birthdate: Nov 13, 1950 Sex: female Admission Date (Current Location): 12/16/2023  [facility] and [place] Number:  Chiropodist and Address:  [facility], [address] [place], [place] [address]      Provider Number: [id number]  Attending Physician Name and Address:  Marsa Edelman, DO  Relative Name and Phone Number:       Current Level of Care: Hospital Recommended Level of Care: Skilled Nursing Facility Prior Approval Number:    Date Approved/Denied:   PASRR Number: [id number]  Discharge Plan: SNF    Current Diagnoses: Patient Active Problem List   Diagnosis Date Noted   Closed displaced intertrochanteric fracture of left femur (HCC) 12/17/2023   Closed left hip fracture (HCC) 12/16/2023   Type II diabetes mellitus with renal manifestations (HCC) 12/16/2023   CKD (chronic kidney disease) stage 2, GFR 60-89 ml/min 12/16/2023   Depression with anxiety 12/16/2023   Fall at home, initial encounter 12/16/2023   Metabolic acidosis 12/16/2023   Incarcerated ventral hernia 10/15/2022   Hip fracture (HCC) 09/23/2022   Displaced spiral fracture of shaft of right femur, initial encounter for closed fracture (HCC) 09/23/2022   Displaced intertrochanteric fracture of right femur, initial encounter for closed fracture (HCC) 09/22/2022   AKI (acute kidney injury) 09/22/2022   Leukocytosis 09/22/2022   GERD (gastroesophageal reflux disease) 09/22/2022   Depression 09/22/2022   Anxiety 09/22/2022   HTN (hypertension) 09/22/2022   HLD (hyperlipidemia) 09/22/2022   DM2 (diabetes mellitus, type 2) (HCC) 01/30/2013   History of total hysterectomy with removal of both tubes and ovaries 04/08/2011    Orientation RESPIRATION BLADDER Height & Weight     Self, Time, Situation, Place  Normal Continent Weight: 136 lb (61.7 kg) Height:  5' 2 (157.5  cm)  BEHAVIORAL SYMPTOMS/MOOD NEUROLOGICAL BOWEL NUTRITION STATUS      Continent Diet  AMBULATORY STATUS COMMUNICATION OF NEEDS Skin   Limited Assist Verbally Surgical wounds (Surgical Closed incision right hip)                       Personal Care Assistance Level of Assistance  Bathing, Dressing, Feeding Bathing Assistance: Limited assistance Feeding assistance: Independent Dressing Assistance: Limited assistance     Functional Limitations Info  Sight, Hearing, Speech Sight Info: Adequate Hearing Info: Impaired Speech Info: Impaired    SPECIAL CARE FACTORS FREQUENCY  PT (By licensed PT), OT (By licensed OT)     PT Frequency: 5x/week OT Frequency: 5x/week            Contractures      Additional Factors Info  Code Status, Allergies Code Status Info: Full Allergies Info: Ivp Dye (Iodinated Contrast Media), Mobic (Meloxicam), Penicillins           Current Medications (12/20/2023):  This is the current hospital active medication list Current Facility-Administered Medications  Medication Dose Route Frequency Provider Last Rate Last Admin   acetaminophen  (TYLENOL ) tablet 650 mg  650 mg Oral Q6H PRN Niu, Xilin, MD   650 mg at 12/19/23 2135   ALPRAZolam  (XANAX ) tablet 0.5 mg  0.5 mg Oral BID PRN Alexander, Natalie, DO   0.5 mg at 12/18/23 1710   alum & mag hydroxide-simeth (MAALOX/MYLANTA) 200-200-20 MG/5ML suspension 30 mL  30 mL Oral Q4H PRN Mansy, Jan A, MD   30 mL at 12/19/23 2134   aspirin  EC tablet 81 mg  81  mg Oral BID Gust Molly, DO   81 mg at 12/20/23 0800   atorvastatin  (LIPITOR) tablet 40 mg  40 mg Oral Daily Niu, Xilin, MD   40 mg at 12/20/23 0800   bisacodyl (DULCOLAX) suppository 10 mg  10 mg Rectal Daily PRN Alexander, Natalie, DO       Chlorhexidine  Gluconate Cloth 2 % PADS 6 each  6 each Topical Daily Gust Molly, DO   6 each at 12/17/23 9185   ferrous sulfate  tablet 325 mg  325 mg Oral Daily Niu, Xilin, MD   325 mg at 12/20/23 0800   FLUoxetine   (PROZAC ) capsule 20 mg  20 mg Oral Daily Niu, Xilin, MD   20 mg at 12/20/23 0801   fluticasone  (FLONASE ) 50 MCG/ACT nasal spray 2 spray  2 spray Each Nare Daily PRN Niu, Xilin, MD       hydrALAZINE  (APRESOLINE ) injection 5 mg  5 mg Intravenous Q2H PRN Niu, Xilin, MD       HYDROcodone -acetaminophen  (NORCO/VICODIN) 5-325 MG per tablet 1-2 tablet  1-2 tablet Oral Q4H PRN Alexander, Natalie, DO   1 tablet at 12/20/23 0809   lidocaine  (LIDODERM ) 5 % 1 patch  1 patch Transdermal Q24H Niu, Xilin, MD   1 patch at 12/18/23 2016   losartan  (COZAAR ) tablet 25 mg  25 mg Oral Daily Niu, Xilin, MD   25 mg at 12/20/23 0801   metFORMIN  (GLUCOPHAGE ) tablet 1,000 mg  1,000 mg Oral BID WC Alexander, Natalie, DO   1,000 mg at 12/20/23 9240   methocarbamol  (ROBAXIN ) tablet 500 mg  500 mg Oral Q8H PRN Niu, Xilin, MD   500 mg at 12/19/23 1238   ondansetron  (ZOFRAN ) injection 4 mg  4 mg Intravenous Q8H PRN Niu, Xilin, MD   4 mg at 12/17/23 1621   Oral care mouth rinse  15 mL Mouth Rinse PRN Niu, Xilin, MD       oxybutynin  (DITROPAN ) tablet 5 mg  5 mg Oral QID Niu, Xilin, MD   5 mg at 12/20/23 0800   pantoprazole  (PROTONIX ) EC tablet 40 mg  40 mg Oral BID Niu, Xilin, MD   40 mg at 12/20/23 0800   polyethylene glycol (MIRALAX  / GLYCOLAX ) packet 17 g  17 g Oral Daily Marsa Edelman, DO   17 g at 12/20/23 0801   senna-docusate (Senokot-S) tablet 2 tablet  2 tablet Oral QHS PRN Alexander, Natalie, DO   2 tablet at 12/17/23 2145     Discharge Medications: Please see discharge summary for a list of discharge medications.  Relevant Imaging Results:  Relevant Lab Results:   Additional Information SSN [id number]  Derico Mitton  [place], LCSW

## 2023-12-20 NOTE — Progress Notes (Signed)
 PT Cancellation Note  Patient Details Name: Rhonda Fields MRN: [id number] DOB: 1950/12/30   Cancelled Treatment:    Reason Eval/Treat Not Completed: Fatigue/lethargy limiting ability to participate  Offered again this pm.  Stated she just had a large BM and was too tired.  Stated she would do therapy tomorrow.  Encouraged OOB with nursing to [facility] when needed.     Lauraine Gills 12/20/2023, 1:47 PM

## 2023-12-20 NOTE — Plan of Care (Signed)
  Problem: Coping: Goal: Ability to adjust to condition or change in health will improve Outcome: Progressing   Problem: Nutritional: Goal: Maintenance of adequate nutrition will improve Outcome: Progressing   Problem: Metabolic: Goal: Ability to maintain appropriate glucose levels will improve Outcome: Progressing   Problem: Skin Integrity: Goal: Risk for impaired skin integrity will decrease Outcome: Progressing   Problem: Tissue Perfusion: Goal: Adequacy of tissue perfusion will improve Outcome: Progressing

## 2023-12-20 NOTE — Progress Notes (Signed)
 PROGRESS NOTE    Rhonda Fields   FMW:[id number] DOB: 04/15/1950  DOA: 12/16/2023 Date of Service: 12/20/23 which is hospital day 4  PCP: Duanne Butler DASEN, MD    Hospital course / significant events:   HPI: Rhonda Fields is a 73 y.o. female with medical history significant of HTN, HLD, DM, depression with anxiety, CKD-2, IBS, hard of hearing, headache, deficiency anemia, who presents with fall, left hip pain. Patient states that she had leg cramps when she was in gas station bathroom, and fell, injured her left hip   11/27: admitted to hospitalist w/ ortho consult for L hip fx 11/28: to OR. 11/29: PT/OT recs for SNF, TOC to start process  11/30-12/01: stable, await SNF      Consultants:  Orthopedic surgery   Procedures/Surgeries: 12/17/23 L hip fracture repair -  ORIF left intertrochanteric femur fracture with cephalomedually nailing       ASSESSMENT & PLAN:   Comminuted intertrochanteric fractures of the left femur with mildly displaced lesser trochanteric fragment and mild impaction of fracture fragments. . S/p IM nail to L femur 12/17/23 Ortho following WBAT LLE Hydrocodone -APAP, robaxin  PT/OT as able DVT Ppx per ortho w/ ASA 81 bid  Follow up ortho outpatient  SNF rehab pending placement   Anxiety/panic Pt reports panicked when working w/ therapy but willing to keep trying to engage w/ PT/OT as able Xanax  prn   Essential HTN IV hydralazine  as needed Home losartan    HLD statin  AKI - resolved - Cr 1.22 on admission 11/27 --> 0.85 11/28 Metabolic acidosis / lactic acidosis - resolved - CO2 19 on admission 11/27 --> 23 11/28, lactate 2.5 --> 1.9 Po hydration Monitor BMP  DM2 non insulin  dependent Last A1C indicates good control at 6.1 resume metformin  and actos   Holding on insulin  unless significant hyperglycemia Monitor Glc periodically   Leukocytosis  Likely reactive w/ injury Follow CBC  CAD/DM2 ASA now at bid per ortho  for DVT ppx   Chronic back pain Chronic opiate use  Continue home hydrocodone  + pain control w/ hip fx as above  Anxiety/Depression Continue fluoxetine  20 mg daily  Xanax  added prn   Urinary frequency Continue oxybutinin  GERD Continue PPI  overweight based on BMI: Body mass index is 26.29 kg/m.SABRA Significantly low or high BMI is associated with higher medical risk.  Underweight - under 18  overweight - 25 to 29 obese - 30 or more Class 1 obesity: BMI of 30.0 to 34 Class 2 obesity: BMI of 35.0 to 39 Class 3 obesity: BMI of 40.0 to 49 Super Morbid Obesity: BMI 50-59 Super-super Morbid Obesity: BMI 60+ Healthy nutrition and physical activity advised as adjunct to other disease management and risk reduction treatments    DVT prophylaxis: ASA IV fluids: no continuous IV fluids  Nutrition: carb modified   Central lines / other devices: none  Code Status: FULL CODE ACP documentation reviewed:  none on file in [facility]  TOC needs: SNF rehab Medical barriers to dispo: none             Subjective / Brief ROS:  Patient reports anxious when working w/ PT on sitting in chair but better now - will try xanax  prior to working w/ therapy today Denies CP/SOB. Pain controlled.  Denies new weakness.    Family Communication: none at this time     Objective Findings:  Vitals:   12/19/23 1735 12/19/23 2011 12/20/23 0438 12/20/23 0933  BP: (!) 144/69 (!) 157/85 138/75  120/70  Pulse: (!) 105  97 (!) 110  Resp: 18 17 17 18   Temp: 98.6 F (37 C) 98.4 F (36.9 C) 98.4 F (36.9 C) 98.3 F (36.8 C)  TempSrc:  Oral Oral   SpO2: 94% 98% 97% 94%  Weight:      Height:        Intake/Output Summary (Last 24 hours) at 12/20/2023 1514 Last data filed at 12/20/2023 1026 Gross per 24 hour  Intake 200 ml  Output 150 ml  Net 50 ml   Filed Weights   12/16/23 1546 12/16/23 2300 12/17/23 1238  Weight: 62.1 kg 65.2 kg 61.7 kg    Examination:  Physical Exam Constitutional:       General: She is not in acute distress. Cardiovascular:     Rate and Rhythm: Normal rate and regular rhythm.  Pulmonary:     Effort: Pulmonary effort is normal.     Breath sounds: Normal breath sounds.  Musculoskeletal:     Right lower leg: No edema.     Left lower leg: No edema.  Skin:    General: Skin is warm and dry.  Neurological:     Mental Status: She is alert. Mental status is at baseline.  Psychiatric:        Mood and Affect: Mood normal.        Behavior: Behavior normal.          Scheduled Medications:   aspirin  EC  81 mg Oral BID   atorvastatin   40 mg Oral Daily   Chlorhexidine  Gluconate Cloth  6 each Topical Daily   ferrous sulfate   325 mg Oral Daily   FLUoxetine   20 mg Oral Daily   lidocaine   1 patch Transdermal Q24H   losartan   25 mg Oral Daily   metFORMIN   1,000 mg Oral BID WC   oxybutynin   5 mg Oral QID   pantoprazole   40 mg Oral BID   polyethylene glycol  17 g Oral Daily    Continuous Infusions:    PRN Medications:  acetaminophen , ALPRAZolam , alum & mag hydroxide-simeth, bisacodyl, fluticasone , hydrALAZINE , HYDROcodone -acetaminophen , methocarbamol , ondansetron  (ZOFRAN ) IV, mouth rinse, senna-docusate  Antimicrobials from admission:  Anti-infectives (From admission, onward)    Start     Dose/Rate Route Frequency Ordered Stop   12/17/23 2200  ceFAZolin  (ANCEF ) IVPB 2g/100 mL premix        2 g 200 mL/hr over 30 Minutes Intravenous Every 8 hours 12/17/23 1508 12/18/23 0554   12/17/23 1300  ceFAZolin  (ANCEF ) IVPB 2g/100 mL premix        2 g 200 mL/hr over 30 Minutes Intravenous 30 min pre-op 12/16/23 1921 12/17/23 1315           Data Reviewed:  I have personally reviewed the following...  CBC: Recent Labs  Lab 12/16/23 1602 12/17/23 0509 12/17/23 2317 12/18/23 0514 12/18/23 1330 12/19/23 0956  WBC 13.7* 15.4*  --  15.0*  --   --   NEUTROABS 9.4*  --   --   --   --   --   HGB 11.3* 11.4* 9.8* 10.3* 10.8* 10.1*  HCT 34.7*  34.0* 28.7* 30.6* 31.7* 30.5*  MCV 91.6 88.8  --  89.2  --   --   PLT 324 294  --  237  --   --    Basic Metabolic Panel: Recent Labs  Lab 12/16/23 1602 12/17/23 0509 12/18/23 0514  NA 138 138 137  K 4.4 4.4 4.4  CL 106 103 104  CO2 19* 23 24  GLUCOSE 132* 122* 134*  BUN 19 14 11   CREATININE 1.22* 0.85 0.90  CALCIUM  8.6* 8.6* 8.4*   GFR: Estimated Creatinine Clearance: 48.8 mL/min (by C-G formula based on SCr of 0.9 mg/dL). Liver Function Tests: Recent Labs  Lab 12/16/23 1602  AST 24  ALT 14  ALKPHOS 74  BILITOT 0.2  PROT 6.8  ALBUMIN 3.9   No results for input(s): LIPASE, AMYLASE in the last 168 hours. No results for input(s): AMMONIA in the last 168 hours. Coagulation Profile: Recent Labs  Lab 12/16/23 1602  INR 1.0   Cardiac Enzymes: No results for input(s): CKTOTAL, CKMB, CKMBINDEX, TROPONINI in the last 168 hours. BNP (last 3 results) No results for input(s): PROBNP in the last 8760 hours. HbA1C: No results for input(s): HGBA1C in the last 72 hours. CBG: Recent Labs  Lab 12/17/23 1519 12/17/23 1705 12/17/23 2131 12/18/23 0743 12/18/23 1141  GLUCAP 130* 166* 225* 113* 153*   Lipid Profile: No results for input(s): CHOL, HDL, LDLCALC, TRIG, CHOLHDL, LDLDIRECT in the last 72 hours. Thyroid Function Tests: No results for input(s): TSH, T4TOTAL, FREET4, T3FREE, THYROIDAB in the last 72 hours. Anemia Panel: No results for input(s): VITAMINB12, FOLATE, FERRITIN, TIBC, IRON , RETICCTPCT in the last 72 hours. Most Recent Urinalysis On File:     Component Value Date/Time   COLORURINE STRAW (A) 10/19/2022 0200   APPEARANCEUR CLEAR (A) 10/19/2022 0200   LABSPEC 1.005 10/19/2022 0200   PHURINE 6.0 10/19/2022 0200   GLUCOSEU NEGATIVE 10/19/2022 0200   HGBUR NEGATIVE 10/19/2022 0200   BILIRUBINUR NEGATIVE 10/19/2022 0200   KETONESUR NEGATIVE 10/19/2022 0200   PROTEINUR NEGATIVE 10/19/2022 0200   NITRITE  NEGATIVE 10/19/2022 0200   LEUKOCYTESUR NEGATIVE 10/19/2022 0200   Sepsis Labs: @LABRCNTIP (procalcitonin:4,lacticidven:4) Microbiology: No results found for this or any previous visit (from the past 240 hours).    Radiology Studies last 3 days: DG HIP UNILAT W OR W/O PELVIS 2-3 VIEWS LEFT Result Date: 12/17/2023 EXAM: 2 OR MORE VIEW(S) XRAY OF THE PELVIS AND LEFT HIP 12/17/2023 03:35:00 PM COMPARISON: 12/16/2023 CLINICAL HISTORY: Post-operative state FINDINGS: BONES AND JOINTS: SI joints are symmetric. Interval IM nail fixation of previously seen left intertrochanteric fracture. Remote right femoral ORIF. Bilateral hips demonstrate normal alignment. SOFT TISSUES: Expected postoperative changes including left hip subcutaneous air. IMPRESSION: 1. Interval IM nail fixation of previously seen left intertrochanteric fracture. Electronically signed by: Norman Gatlin MD 12/17/2023 07:23 PM EST RP Workstation: [id number]   DG HIP UNILAT WITH PELVIS 2-3 VIEWS LEFT Result Date: 12/17/2023 CLINICAL DATA:  Left femoral intramedullary nail placement EXAM: DG HIP (WITH OR WITHOUT PELVIS) 3V LEFT COMPARISON:  Left hip radiograph dated 12/16/2023 FINDINGS: Three fluoroscopic images obtained during left femoral intramedullary nail placement. 2 minutes 57 seconds fluoro time utilized. Radiation dose 16.015 mGy Kerma. Please see performing physicians operative report for full details. IMPRESSION: Fluoroscopic images were obtained for intraoperative guidance of left femoral intramedullary nail placement. Electronically Signed   By: Limin  Xu M.D.   On: 12/17/2023 19:08   DG C-Arm 1-60 Min-No Report Result Date: 12/17/2023 Fluoroscopy was utilized by the requesting physician.  No radiographic interpretation.   DG C-Arm 1-60 Min-No Report Result Date: 12/17/2023 Fluoroscopy was utilized by the requesting physician.  No radiographic interpretation.   CT Hip Left Wo Contrast Result Date: 12/16/2023 EXAM: CT OF  THE LEFT HIP WITHOUT IV CONTRAST 12/16/2023 06:28:04 PM TECHNIQUE: CT of the left hip was performed without the administration of intravenous contrast.  Multiplanar reformatted images are provided for review. Automated exposure control, iterative reconstruction, and/or weight based adjustment of the mA/kV was utilized to reduce the radiation dose to as low as reasonably achievable. COMPARISON: Comparison with left hip radiographs 12/16/2023. CLINICAL HISTORY: Hip trauma, fracture suspected, xray done. FINDINGS: BONES: Comminuted intertrochanteric fractures of the femur left hip with mildly displaced lesser trochanteric fragment and mild impaction of fracture fragments. No dislocation at the hip joint. Visualized pubic rami and left hemipelvis appear intact. No aggressive appearing osseous abnormality or periostitis. SOFT TISSUE: No significant soft tissue edema or fluid collections. No soft tissue mass. JOINT: No significant degenerative changes. No osseous erosions. INTRAPELVIC CONTENTS: Limited images of the intrapelvic contents are unremarkable. IMPRESSION: 1. Comminuted intertrochanteric fractures of the left femur with mildly displaced lesser trochanteric fragment and mild impaction of fracture fragments. No dislocation at the hip joint. Electronically signed by: Elsie Gravely MD 12/16/2023 06:33 PM EST RP Workstation: [id number]   DG Hip Unilat With Pelvis 2-3 Views Left Result Date: 12/16/2023 EXAM: 2 or 3 VIEW(S) XRAY OF THE LEFT HIP 12/16/2023 05:19:00 PM COMPARISON: None available. CLINICAL HISTORY: Pain after a fall. FINDINGS: BONES AND JOINTS: The left hip is somewhat rotated. Skin folds overlying limit examination, but there appears to be a persistent linear lucency along the base of the Femoral Neck and extending to the Greater Trochanteric Region with a slight cortical step-off. This suggests a nondisplaced fracture. CT is suggested for correlation and confirmation. The hip joint is maintained.  No significant degenerative changes. Contralateral right hip demonstrates postoperative fixation, incompletely visualized. The pelvis and sacrum appear intact. The Sacroiliac Joints and Symphysis Pubis are not displaced. SOFT TISSUES: Skin folds overlying limit examination. Vascular calcifications. IMPRESSION: 1. Possible nondisplaced fracture of the left femoral neck extending to the greater trochanteric region with slight cortical step-off. Recommend CT for confirmation. 2. Postoperative fixation in the contralateral right hip, incompletely visualized. Electronically signed by: Elsie Gravely MD 12/16/2023 05:29 PM EST RP Workstation: [id number]   CT Head Wo Contrast Result Date: 12/16/2023 CLINICAL DATA:  Fall. EXAM: CT HEAD WITHOUT CONTRAST CT CERVICAL SPINE WITHOUT CONTRAST TECHNIQUE: Multidetector CT imaging of the head and cervical spine was performed following the standard protocol without intravenous contrast. Multiplanar CT image reconstructions of the cervical spine were also generated. RADIATION DOSE REDUCTION: This exam was performed according to the departmental dose-optimization program which includes automated exposure control, adjustment of the mA and/or kV according to patient size and/or use of iterative reconstruction technique. COMPARISON:  Head and cervical CT, 09/22/2022 FINDINGS: CT HEAD FINDINGS Brain: No evidence of acute infarction, hemorrhage, hydrocephalus, extra-axial collection or mass lesion/mass effect. Vascular: No hyperdense vessel or unexpected calcification. Skull: Normal. Negative for fracture or focal lesion. Sinuses/Orbits: Globes and orbits are unremarkable. Sinuses are essentially clear. Other: None. CT CERVICAL SPINE FINDINGS Alignment: Slight degenerative anterolisthesis of C4 on C5. No other malalignment. Skull base and vertebrae: No acute fracture. No primary bone lesion or focal pathologic process. Soft tissues and spinal canal: No prevertebral fluid or swelling.  No visible canal hematoma. Disc levels: Mild loss of disc height at C5-C6 with moderate loss of disc height at C6-C7. Facet degenerative change noted bilaterally most evident along the mid cervical spine. No significant disc bulging or convincing disc herniation. Upper chest: No acute findings. Other: None. IMPRESSION: HEAD CT 1. No acute intracranial abnormalities. CERVICAL CT 1. No fracture or acute finding. Electronically Signed   By: Alm Parkins M.D.   On: 12/16/2023 16:59  CT Cervical Spine Wo Contrast Result Date: 12/16/2023 CLINICAL DATA:  Fall. EXAM: CT HEAD WITHOUT CONTRAST CT CERVICAL SPINE WITHOUT CONTRAST TECHNIQUE: Multidetector CT imaging of the head and cervical spine was performed following the standard protocol without intravenous contrast. Multiplanar CT image reconstructions of the cervical spine were also generated. RADIATION DOSE REDUCTION: This exam was performed according to the departmental dose-optimization program which includes automated exposure control, adjustment of the mA and/or kV according to patient size and/or use of iterative reconstruction technique. COMPARISON:  Head and cervical CT, 09/22/2022 FINDINGS: CT HEAD FINDINGS Brain: No evidence of acute infarction, hemorrhage, hydrocephalus, extra-axial collection or mass lesion/mass effect. Vascular: No hyperdense vessel or unexpected calcification. Skull: Normal. Negative for fracture or focal lesion. Sinuses/Orbits: Globes and orbits are unremarkable. Sinuses are essentially clear. Other: None. CT CERVICAL SPINE FINDINGS Alignment: Slight degenerative anterolisthesis of C4 on C5. No other malalignment. Skull base and vertebrae: No acute fracture. No primary bone lesion or focal pathologic process. Soft tissues and spinal canal: No prevertebral fluid or swelling. No visible canal hematoma. Disc levels: Mild loss of disc height at C5-C6 with moderate loss of disc height at C6-C7. Facet degenerative change noted bilaterally  most evident along the mid cervical spine. No significant disc bulging or convincing disc herniation. Upper chest: No acute findings. Other: None. IMPRESSION: HEAD CT 1. No acute intracranial abnormalities. CERVICAL CT 1. No fracture or acute finding. Electronically Signed   By: Alm Parkins M.D.   On: 12/16/2023 16:59         Aitanna Haubner, DO [facility] 12/20/2023, 3:14 PM    Dictation software may have been used to generate the above note. Typos may occur and escape review in typed/dictated notes. Please contact Dr Marsa directly for clarity if needed.  Staff may message me via secure chat in Epic  but this may not receive an immediate response,  please page me for urgent matters!  If 7PM-7AM, please contact night coverage [email or web address]

## 2023-12-20 NOTE — Progress Notes (Signed)
 PT Cancellation Note  Patient Details Name: Rhonda Fields MRN: [id number] DOB: 1950/07/30   Cancelled Treatment:    Reason Eval/Treat Not Completed: Other (comment) Pt attempted x 2 this am.  1st attempt fatigued from OOB with nursing.  On second attempt, pt c/o stomach upset and eating crackers.  Offered BSC but she declined.  Will return later this date.   Lauraine Gills 12/20/2023, 11:22 AM

## 2023-12-21 DIAGNOSIS — S72002A Fracture of unspecified part of neck of left femur, initial encounter for closed fracture: Secondary | ICD-10-CM | POA: Diagnosis not present

## 2023-12-21 LAB — CBC
HCT: 26.7 % — ABNORMAL LOW (ref 36.0–46.0)
Hemoglobin: 8.8 g/dL — ABNORMAL LOW (ref 12.0–15.0)
MCH: 30.4 pg (ref 26.0–34.0)
MCHC: 33 g/dL (ref 30.0–36.0)
MCV: 92.4 fL (ref 80.0–100.0)
Platelets: 245 K/uL (ref 150–400)
RBC: 2.89 MIL/uL — ABNORMAL LOW (ref 3.87–5.11)
RDW: 13 % (ref 11.5–15.5)
WBC: 10.2 K/uL (ref 4.0–10.5)
nRBC: 0 % (ref 0.0–0.2)

## 2023-12-21 LAB — BASIC METABOLIC PANEL WITH GFR
Anion gap: 12 (ref 5–15)
BUN: 14 mg/dL (ref 8–23)
CO2: 19 mmol/L — ABNORMAL LOW (ref 22–32)
Calcium: 8.2 mg/dL — ABNORMAL LOW (ref 8.9–10.3)
Chloride: 100 mmol/L (ref 98–111)
Creatinine, Ser: 0.82 mg/dL (ref 0.44–1.00)
GFR, Estimated: >60 mL/min (ref >60)
Glucose, Bld: 108 mg/dL — ABNORMAL HIGH (ref 70–99)
Potassium: 4.7 mmol/L (ref 3.5–5.1)
Sodium: 131 mmol/L — ABNORMAL LOW (ref 135–145)

## 2023-12-21 MED ORDER — ALPRAZOLAM 0.5 MG PO TABS: 0.5000 mg | ORAL_TABLET | Freq: Two times a day (BID) | ORAL | 0 refills | Status: AC | PRN

## 2023-12-21 MED ORDER — ALUM & MAG HYDROXIDE-SIMETH 200-200-20 MG/5ML PO SUSP: 30.0000 mL | ORAL | Status: AC | PRN

## 2023-12-21 MED ORDER — ACETAMINOPHEN 325 MG PO TABS: 650.0000 mg | ORAL_TABLET | Freq: Four times a day (QID) | ORAL | Status: AC | PRN

## 2023-12-21 MED ORDER — HYDROCODONE-ACETAMINOPHEN 5-325 MG PO TABS: 1.0000 | ORAL_TABLET | Freq: Four times a day (QID) | ORAL | 0 refills | Status: AC | PRN

## 2023-12-21 MED ORDER — METHOCARBAMOL 500 MG PO TABS: 500.0000 mg | ORAL_TABLET | Freq: Three times a day (TID) | ORAL | Status: DC | PRN

## 2023-12-21 MED ORDER — LIDOCAINE 5 % EX PTCH: 1.0000 | MEDICATED_PATCH | CUTANEOUS | Status: AC

## 2023-12-21 MED ORDER — TRIAMCINOLONE ACETONIDE 0.1 % EX CREA: 1.0000 | TOPICAL_CREAM | Freq: Two times a day (BID) | CUTANEOUS | Status: AC | PRN

## 2023-12-21 MED ORDER — SENNOSIDES-DOCUSATE SODIUM 8.6-50 MG PO TABS: 2.0000 | ORAL_TABLET | Freq: Every evening | ORAL | Status: AC | PRN

## 2023-12-21 NOTE — TOC Transition Note (Signed)
 Transition of Care [facility]) - Discharge Note   Patient Details  Name: Rhonda Fields MRN: [id number] Date of Birth: 11-Nov-1950  Transition of [facility]) CM/SW Contact:  Alvaro Louder, LCSW Phone Number: 12/21/2023, 1:34 PM   Clinical Narrative:   LCSWA received insurance approval for patient to admit to SNF. LCSWA confirmed with MD that patient is stable for discharge. LCSWA notified the patient and they are in agreement with discharge. LCSWA confirmed bed is available at SNF. Transport arranged with [facility] for next available.  RM E10, Number to call report: [phone]   [facility] signing off    Final next level of care: Skilled Nursing Facility Barriers to Discharge: No Barriers Identified   Patient Goals and CMS Choice            Discharge Placement              Patient chooses bed at:  ([facility]) Patient to be transferred to facility by: [facility] Name of family member notified: Self Patient and family notified of of transfer: 12/21/23  Discharge Plan and Services Additional resources added to the After Visit Summary for     Discharge Planning Services: CM Consult                                 Social Drivers of Health (SDOH) Interventions SDOH Screenings   Food Insecurity: No Food Insecurity (12/17/2023)  Housing: Low Risk  (12/17/2023)  Transportation Needs: Unmet Transportation Needs (12/17/2023)  Utilities: At Risk (12/17/2023)  Alcohol Screen: Low Risk  (06/02/2023)  Depression (PHQ2-9): Low Risk  (06/02/2023)  Financial Resource Strain: Low Risk  (06/02/2023)  Physical Activity: Inactive (06/02/2023)  Social Connections: Socially Isolated (12/17/2023)  Stress: No Stress Concern Present (06/02/2023)  Tobacco Use: Medium Risk (10/28/2023)  Health Literacy: Adequate Health Literacy (06/02/2023)     Readmission Risk Interventions     No data to display

## 2023-12-21 NOTE — Progress Notes (Signed)
 [facility]  PROCEDURAL EXPEDITER PROGRESS NOTE  Patient Name: Rhonda Fields  DOB:Mar 12, 1950 Date of Admission: 12/16/2023  Date of Assessment:12/21/23   ------------------------------------------------------------------------------------------------------------------- Going to SNF -------------------------------------------------------------------------------------------------------------------  [facility] [employer], Ronal DELENA Bald Please contact us  directly via secure chat (search for [facility]) or by calling us  at [phone] [facility]).

## 2023-12-21 NOTE — Progress Notes (Signed)
 Occupational Therapy Treatment Patient Details Name: Rhonda Fields MRN: [id number] DOB: [birth year] Today's Date: 12/21/2023   History of present illness Pt is a 73 y/o F admitted on 12/16/23 after presenting with c/o fall, L hip pain. Pt found to have L intertrochanteric femur fx. Pt is s/p ORIF on 12/17/23. PMH: HTN, HLD, DM, depression with anxiety, CKD 2, IBS, HOH, HA, deficiency anemia   OT comments  Patient seen for OT treatment on this date. Upon arrival to room patient resting in bed, agreeable to treatment. Patient leaving soon for SNF, OT assisted with ADL in prep for discharge. All dressing performed sitting EOB, patient unable to complete LB dressing without significant A due to pain/stiffness; set up A for UB tasks.  Patient ended treatment sitting EOB with bed alarm on and all needs within reach. Patient making good progress toward goals, will continue to follow POC. Discharge recommendation remains appropriate.        If plan is discharge home, recommend the following:  A lot of help with walking and/or transfers;A lot of help with bathing/dressing/bathroom;Assistance with cooking/housework;Assist for transportation;Help with stairs or ramp for entrance   Equipment Recommendations  None recommended by OT    Recommendations for Other Services      Precautions / Restrictions Precautions Precautions: Fall Recall of Precautions/Restrictions: Intact Restrictions Weight Bearing Restrictions Per Provider Order: Yes LLE Weight Bearing Per Provider Order: Weight bearing as tolerated       Mobility Bed Mobility Overal bed mobility: Needs Assistance Bed Mobility: Supine to Sit     Supine to sit: Min assist, HOB elevated, Used rails          Transfers Overall transfer level: Needs assistance Equipment used: Rolling walker (2 wheels) Transfers: Sit to/from Stand Sit to Stand: Min assist, From elevated surface                 Balance Overall balance  assessment: Needs assistance Sitting-balance support: Feet supported Sitting balance-Leahy Scale: Good     Standing balance support: During functional activity, Bilateral upper extremity supported, Reliant on assistive device for balance Standing balance-Leahy Scale: Poor Standing balance comment: heavy reliance on RW for support                           ADL either performed or assessed with clinical judgement   ADL Overall ADL's : Needs assistance/impaired                 Upper Body Dressing : Set up;Sitting   Lower Body Dressing: Maximal assistance;Sit to/from stand Lower Body Dressing Details (indicate cue type and reason): A to thread BLE into underpants/pants, A to pull up over hips, A to don B sock/shoes             Functional mobility during ADLs: Minimal assistance;Rolling walker (2 wheels);+2 for physical assistance General ADL Comments: difficulty threading BLE into LB clothing due to pain    Extremity/Trunk Assessment Upper Extremity Assessment Upper Extremity Assessment: Overall WFL for tasks assessed   Lower Extremity Assessment Lower Extremity Assessment: Defer to PT evaluation        Vision       Perception     Praxis     Communication Communication Communication: Impaired Factors Affecting Communication: Hearing impaired   Cognition Arousal: Alert Behavior During Therapy: Anxious, WFL for tasks assessed/performed Cognition: No apparent impairments  Following commands: Impaired Following commands impaired: Follows one step commands with increased time      Cueing   Cueing Techniques: Verbal cues, Tactile cues  Exercises      Shoulder Instructions       General Comments      Pertinent Vitals/ Pain          Home Living                                          Prior Functioning/Environment              Frequency  Min 2X/week        Progress  Toward Goals  OT Goals(current goals can now be found in the care plan section)  Progress towards OT goals: Progressing toward goals  Acute Rehab OT Goals Patient Stated Goal: to go home OT Goal Formulation: With patient Time For Goal Achievement: 01/01/24 Potential to Achieve Goals: Good ADL Goals Pt Will Perform Lower Body Dressing: with min assist;sit to/from stand;with adaptive equipment Pt Will Transfer to Toilet: with supervision;ambulating;bedside commode Pt Will Perform Toileting - Clothing Manipulation and hygiene: with modified independence;sitting/lateral leans Additional ADL Goal #1: Pt will verbalize plan to implement at least 2 learned falls prevention strategies to maximize safety.  Plan      Co-evaluation                 AM-PAC OT 6 Clicks Daily Activity     Outcome Measure   Help from another person eating meals?: None Help from another person taking care of personal grooming?: None Help from another person toileting, which includes using toliet, bedpan, or urinal?: A Lot Help from another person bathing (including washing, rinsing, drying)?: A Lot Help from another person to put on and taking off regular upper body clothing?: A Little Help from another person to put on and taking off regular lower body clothing?: A Lot 6 Click Score: 17    End of Session Equipment Utilized During Treatment: Rolling walker (2 wheels)  OT Visit Diagnosis: Other abnormalities of gait and mobility (R26.89);History of falling (Z91.81);Muscle weakness (generalized) (M62.81);Pain Pain - Right/Left: Left Pain - part of body: Hip   Activity Tolerance Patient tolerated treatment well   Patient Left in bed;with call bell/phone within reach;with bed alarm set   Nurse Communication          Time: 8871-8850 OT Time Calculation (min): 21 min  Charges: OT General Charges $OT Visit: 1 Visit OT Treatments $Self Care/Home Management : 8-22 mins  Rogers Clause, OT/L MSOT,  12/21/2023

## 2023-12-21 NOTE — Plan of Care (Signed)

## 2023-12-21 NOTE — Discharge Summary (Signed)
 Physician Discharge Summary   Patient: Rhonda Fields MRN: [id number]  DOB: [birth year]   Admit:     Date of Admission: 12/16/2023 Admitted from: home   Discharge: Date of discharge: 12/21/23 Disposition: Skilled nursing facility Condition at discharge: good  CODE STATUS: FULL CODE     Discharge Physician: Laneta Blunt, DO [facility]     PCP: Duanne Butler DASEN, MD  Recommendations for Outpatient Follow-up:  Follow up with PCP Duanne Butler DASEN, MD in 2-4 weeks Follow up with orthopedics as directed 2 weeks after surgery - Dr Gust       Discharge Diagnoses: Principal Problem:   Closed left hip fracture [facility]) Active Problems:   Leukocytosis   Fall at home, initial encounter   HTN (hypertension)   HLD (hyperlipidemia)   Type II diabetes mellitus with renal manifestations (HCC)   Metabolic acidosis   CKD (chronic kidney disease) stage 2, GFR 60-89 ml/min   Depression with anxiety   Closed displaced intertrochanteric fracture of left femur [facility])       Hospital course / significant events:   HPI: Rhonda Fields is a 73 y.o. female with medical history significant of HTN, HLD, DM, depression with anxiety, CKD-2, IBS, hard of hearing, headache, deficiency anemia, who presents with fall, left hip pain. Patient states that she had leg cramps when she was in gas station bathroom, and fell, injured her left hip   11/27: admitted to hospitalist w/ ortho consult for L hip fx 11/28: to OR. 11/29: Rhonda Fields/OT recs for SNF, TOC to start process  11/30-12/01: stable, await SNF 12/02: to SNF      Consultants:  Orthopedic surgery   Procedures/Surgeries: 12/17/23 L hip fracture repair -  ORIF left intertrochanteric femur fracture with cephalomedually nailing       ASSESSMENT & PLAN:   Comminuted intertrochanteric fractures of the left femur with mildly displaced lesser trochanteric fragment and mild impaction of fracture fragments.  . S/p IM nail to L femur 12/17/23 Ortho following - 2 weeks in office  WBAT LLE Hydrocodone -APAP, robaxin , lidoderm   Rhonda Fields/OT as able DVT Ppx per ortho w/ ASA 81 bid SNF rehab   Anxiety/panic Rhonda Fields reports panicked when working w/ therapy but willing to keep trying to engage w/ Rhonda Fields/OT as able Xanax  prn   Essential HTN Home losartan    HLD statin  AKI - resolved - Cr 1.22 on admission 11/27 --> 0.85 11/28 Metabolic acidosis / lactic acidosis - resolved - CO2 19 on admission 11/27 --> 23 11/28, lactate 2.5 --> 1.9 Po hydration Monitor BMP periodically   DM2 non insulin  dependent Last A1C indicates good control at 6.1 resume metformin  and actos  home meds  Monitor Glc periodically   Leukocytosis  Likely reactive w/ injury Follow CBC periodically   CAD/DM2 ASA now at bid per ortho for DVT ppx   Chronic back pain Chronic opiate use  Continue home hydrocodone  + pain control w/ hip fx as above  Anxiety/Depression Continue fluoxetine  20 mg daily  Xanax  added prn   Urinary frequency No longer on oxybutinin but resume as needed  GERD Continue PPI  overweight based on BMI: Body mass index is 26.29 kg/m.SABRA Significantly low or high BMI is associated with higher medical risk.  Underweight - under 18  overweight - 25 to 29 obese - 30 or more Class 1 obesity: BMI of 30.0 to 34 Class 2 obesity: BMI of 35.0 to 39 Class 3 obesity: BMI of 40.0 to 49 Super Morbid  Obesity: BMI 50-59 Super-super Morbid Obesity: BMI 60+ Healthy nutrition and physical activity advised as adjunct to other disease management and risk reduction treatments   .            Discharge Instructions  Allergies as of 12/21/2023       Reactions   Ivp Dye [iodinated Contrast Media] Itching, Nausea And Vomiting   Mobic [meloxicam] Other (See Comments)   Lower extremity swelling   Penicillins Hives, Itching        Medication List     PAUSE taking these medications    DropSafe Alcohol Prep  70 % Pads Wait to take this until your doctor or other care provider tells you to start again. USE AS DIRECTED PRIOR TO MONITORING BLOOD GLUCOSE UP TO THREE TIMES DAILY   glucose blood test strip Wait to take this until your doctor or other care provider tells you to start again. Use as instructed   True Metrix Level 1 Low Soln Wait to take this until your doctor or other care provider tells you to start again. Use as directed to monitor FSBS 1x daily. Dx: E11.9   True Metrix Meter w/Device Kit Wait to take this until your doctor or other care provider tells you to start again. USE AS DIRECTED   TRUEplus Lancets 33G Misc Wait to take this until your doctor or other care provider tells you to start again. Use as directed to monitor FSBS 1x daily. Dx: E11.9       STOP taking these medications    cyclobenzaprine  5 MG tablet Commonly known as: FLEXERIL    oxybutynin  5 MG tablet Commonly known as: DITROPAN        TAKE these medications    acetaminophen  325 MG tablet Commonly known as: TYLENOL  Take 2 tablets (650 mg total) by mouth every 6 (six) hours as needed for mild pain (pain score 1-3), fever or headache.   alendronate  70 MG tablet Commonly known as: FOSAMAX  TAKE 1 TABLET EVERY 7 DAYS WITH A FULL GLASS OF WATER ON AN EMPTY STOMACH   ALPRAZolam  0.5 MG tablet Commonly known as: XANAX  Take 1 tablet (0.5 mg total) by mouth 2 (two) times daily as needed for anxiety.   alum & mag hydroxide-simeth 200-200-20 MG/5ML suspension Commonly known as: MAALOX/MYLANTA Take 30 mLs by mouth every 4 (four) hours as needed for indigestion or heartburn.   aspirin  EC 81 MG tablet Take 1 tablet (81 mg total) by mouth in the morning and at bedtime. Swallow whole. What changed:  when to take this additional instructions   atorvastatin  40 MG tablet Commonly known as: LIPITOR TAKE 1 TABLET EVERY DAY   CALCIUM  + VITAMIN D3 PO Take 1 tablet by mouth daily.   docusate sodium  100 MG  capsule Commonly known as: COLACE Take 1 capsule (100 mg total) by mouth 2 (two) times daily as needed for mild constipation.   FeroSul 325 (65 Fe) MG tablet Generic drug: ferrous sulfate  TAKE 1 TABLET EVERY DAY   FISH OIL PO Take 2,000 mg by mouth daily.   FLUoxetine  20 MG capsule Commonly known as: PROZAC  TAKE 1 CAPSULE EVERY DAY   fluticasone  50 MCG/ACT nasal spray Commonly known as: FLONASE  USE 2 SPRAYS IN EACH NOSTRIL EVERY DAY   furosemide  40 MG tablet Commonly known as: LASIX  TAKE 1 TABLET EVERY DAY AS NEEDED FOR EDEMA   HYDROcodone -acetaminophen  5-325 MG tablet Commonly known as: Norco Take 1-2 tablets by mouth every 6 (six) hours as needed for moderate  pain (pain score 4-6) or severe pain (pain score 7-10). What changed:  how much to take reasons to take this   lidocaine  5 % Commonly known as: LIDODERM  Place 1 patch onto the skin daily. Remove & Discard patch within 12 hours or as directed by MD   losartan  25 MG tablet Commonly known as: COZAAR  TAKE 1 TABLET EVERY DAY   metFORMIN  1000 MG tablet Commonly known as: GLUCOPHAGE  TAKE 1 TABLET TWICE DAILY WITH MEALS   methocarbamol  500 MG tablet Commonly known as: ROBAXIN  Take 1 tablet (500 mg total) by mouth every 8 (eight) hours as needed for muscle spasms.   ondansetron  4 MG disintegrating tablet Commonly known as: ZOFRAN -ODT Take 1 tablet (4 mg total) by mouth every 6 (six) hours as needed for nausea.   pantoprazole  40 MG tablet Commonly known as: PROTONIX  TAKE 1 TABLET TWICE DAILY   pioglitazone  30 MG tablet Commonly known as: ACTOS  TAKE 1 TABLET EVERY DAY   polyethylene glycol 17 g packet Commonly known as: MIRALAX  / GLYCOLAX  Take 17 g by mouth daily as needed for moderate constipation or severe constipation.   senna-docusate 8.6-50 MG tablet Commonly known as: Senokot-S Take 2 tablets by mouth at bedtime as needed for mild constipation.   triamcinolone  cream 0.1 % Commonly known as:  KENALOG  Apply 1 Application topically 2 (two) times daily as needed (skin irritation, itching).   VITAMIN C PO Take 1 tablet by mouth daily.   ZINC PO Take 1 tablet by mouth daily.         Contact information for follow-up providers     Gust Molly, DO. Schedule an appointment as soon as possible for a visit in 2 week(s).   Specialty: Orthopedic Surgery Contact information: [address] [place] [address] [phone]              Contact information for after-discharge care     Destination     [facility] .   Service: Skilled Nursing Contact information: [address] [place]  [address] [phone]                     Allergies  Allergen Reactions   Ivp Dye [Iodinated Contrast Media] Itching and Nausea And Vomiting   Mobic [Meloxicam] Other (See Comments)    Lower extremity swelling   Penicillins Hives and Itching     Subjective: Rhonda Fields reports pain is overall controlled but still hurts, some anxiety but this is better. No CP/SOB, no HA/VC, tolerating diet, participating in therapy    Discharge Exam: BP 124/73 (BP Location: Left Arm)   Pulse (!) 104   Temp (!) 97.1 F (36.2 C)   Resp 18   Ht 5' 2 (1.575 m)   Wt 61.7 kg   SpO2 92%   BMI 24.87 kg/m  General: Rhonda Fields is alert, awake, not in acute distress Cardiovascular: RRR, S1/S2 +, no rubs, no gallops Respiratory: CTA bilaterally, no wheezing, no rhonchi Abdominal: Soft, NT, ND, bowel sounds + Extremities: no edema, no cyanosis     The results of significant diagnostics from this hospitalization (including imaging, microbiology, ancillary and laboratory) are listed below for reference.     Microbiology: No results found for this or any previous visit (from the past 240 hours).   Labs: BNP (last 3 results) No results for input(s): BNP in the last 8760 hours. Basic Metabolic Panel: Recent Labs  Lab 12/16/23 1602  12/17/23 0509 12/18/23  9485 12/21/23 0502  NA 138 138 137 131*  K 4.4 4.4 4.4 4.7  CL 106 103 104 100  CO2 19* 23 24 19*  GLUCOSE 132* 122* 134* 108*  BUN 19 14 11 14   CREATININE 1.22* 0.85 0.90 0.82  CALCIUM  8.6* 8.6* 8.4* 8.2*   Liver Function Tests: Recent Labs  Lab 12/16/23 1602  AST 24  ALT 14  ALKPHOS 74  BILITOT 0.2  PROT 6.8  ALBUMIN 3.9   No results for input(s): LIPASE, AMYLASE in the last 168 hours. No results for input(s): AMMONIA in the last 168 hours. CBC: Recent Labs  Lab 12/16/23 1602 12/17/23 0509 12/17/23 2317 12/18/23 0514 12/18/23 1330 12/19/23 0956 12/21/23 0502  WBC 13.7* 15.4*  --  15.0*  --   --  10.2  NEUTROABS 9.4*  --   --   --   --   --   --   HGB 11.3* 11.4* 9.8* 10.3* 10.8* 10.1* 8.8*  HCT 34.7* 34.0* 28.7* 30.6* 31.7* 30.5* 26.7*  MCV 91.6 88.8  --  89.2  --   --  92.4  PLT 324 294  --  237  --   --  245   Cardiac Enzymes: No results for input(s): CKTOTAL, CKMB, CKMBINDEX, TROPONINI in the last 168 hours. BNP: Invalid input(s): POCBNP CBG: Recent Labs  Lab 12/17/23 1519 12/17/23 1705 12/17/23 2131 12/18/23 0743 12/18/23 1141  GLUCAP 130* 166* 225* 113* 153*   D-Dimer No results for input(s): DDIMER in the last 72 hours. Hgb A1c No results for input(s): HGBA1C in the last 72 hours. Lipid Profile No results for input(s): CHOL, HDL, LDLCALC, TRIG, CHOLHDL, LDLDIRECT in the last 72 hours. Thyroid function studies No results for input(s): TSH, T4TOTAL, T3FREE, THYROIDAB in the last 72 hours.  Invalid input(s): FREET3 Anemia work up No results for input(s): VITAMINB12, FOLATE, FERRITIN, TIBC, IRON , RETICCTPCT in the last 72 hours. Urinalysis    Component Value Date/Time   COLORURINE STRAW (A) 10/19/2022 0200   APPEARANCEUR CLEAR (A) 10/19/2022 0200   LABSPEC 1.005 10/19/2022 0200   PHURINE 6.0 10/19/2022 0200   GLUCOSEU NEGATIVE 10/19/2022 0200   HGBUR NEGATIVE  10/19/2022 0200   BILIRUBINUR NEGATIVE 10/19/2022 0200   KETONESUR NEGATIVE 10/19/2022 0200   PROTEINUR NEGATIVE 10/19/2022 0200   NITRITE NEGATIVE 10/19/2022 0200   LEUKOCYTESUR NEGATIVE 10/19/2022 0200   Sepsis Labs Recent Labs  Lab 12/16/23 1602 12/17/23 0509 12/18/23 0514 12/21/23 0502  WBC 13.7* 15.4* 15.0* 10.2   Microbiology No results found for this or any previous visit (from the past 240 hours). Imaging DG HIP UNILAT W OR W/O PELVIS 2-3 VIEWS LEFT Result Date: 12/17/2023 EXAM: 2 OR MORE VIEW(S) XRAY OF THE PELVIS AND LEFT HIP 12/17/2023 03:35:00 PM COMPARISON: 12/16/2023 CLINICAL HISTORY: Post-operative state FINDINGS: BONES AND JOINTS: SI joints are symmetric. Interval IM nail fixation of previously seen left intertrochanteric fracture. Remote right femoral ORIF. Bilateral hips demonstrate normal alignment. SOFT TISSUES: Expected postoperative changes including left hip subcutaneous air. IMPRESSION: 1. Interval IM nail fixation of previously seen left intertrochanteric fracture. Electronically signed by: Norman Gatlin MD 12/17/2023 07:23 PM EST RP Workstation: [id number]   DG HIP UNILAT WITH PELVIS 2-3 VIEWS LEFT Result Date: 12/17/2023 CLINICAL DATA:  Left femoral intramedullary nail placement EXAM: DG HIP (WITH OR WITHOUT PELVIS) 3V LEFT COMPARISON:  Left hip radiograph dated 12/16/2023 FINDINGS: Three fluoroscopic images obtained during left femoral intramedullary nail placement. 2 minutes 57 seconds fluoro time utilized. Radiation dose 16.015 mGy  Kerma. Please see performing physicians operative report for full details. IMPRESSION: Fluoroscopic images were obtained for intraoperative guidance of left femoral intramedullary nail placement. Electronically Signed   By: Limin  Xu M.D.   On: 12/17/2023 19:08   DG C-Arm 1-60 Min-No Report Result Date: 12/17/2023 Fluoroscopy was utilized by the requesting physician.  No radiographic interpretation.   DG C-Arm 1-60 Min-No  Report Result Date: 12/17/2023 Fluoroscopy was utilized by the requesting physician.  No radiographic interpretation.   CT Hip Left Wo Contrast Result Date: 12/16/2023 EXAM: CT OF THE LEFT HIP WITHOUT IV CONTRAST 12/16/2023 06:28:04 PM TECHNIQUE: CT of the left hip was performed without the administration of intravenous contrast. Multiplanar reformatted images are provided for review. Automated exposure control, iterative reconstruction, and/or weight based adjustment of the mA/kV was utilized to reduce the radiation dose to as low as reasonably achievable. COMPARISON: Comparison with left hip radiographs 12/16/2023. CLINICAL HISTORY: Hip trauma, fracture suspected, xray done. FINDINGS: BONES: Comminuted intertrochanteric fractures of the femur left hip with mildly displaced lesser trochanteric fragment and mild impaction of fracture fragments. No dislocation at the hip joint. Visualized pubic rami and left hemipelvis appear intact. No aggressive appearing osseous abnormality or periostitis. SOFT TISSUE: No significant soft tissue edema or fluid collections. No soft tissue mass. JOINT: No significant degenerative changes. No osseous erosions. INTRAPELVIC CONTENTS: Limited images of the intrapelvic contents are unremarkable. IMPRESSION: 1. Comminuted intertrochanteric fractures of the left femur with mildly displaced lesser trochanteric fragment and mild impaction of fracture fragments. No dislocation at the hip joint. Electronically signed by: Elsie Gravely MD 12/16/2023 06:33 PM EST RP Workstation: [id number]   DG Hip Unilat With Pelvis 2-3 Views Left Result Date: 12/16/2023 EXAM: 2 or 3 VIEW(S) XRAY OF THE LEFT HIP 12/16/2023 05:19:00 PM COMPARISON: None available. CLINICAL HISTORY: Pain after a fall. FINDINGS: BONES AND JOINTS: The left hip is somewhat rotated. Skin folds overlying limit examination, but there appears to be a persistent linear lucency along the base of the Femoral Neck and extending  to the Greater Trochanteric Region with a slight cortical step-off. This suggests a nondisplaced fracture. CT is suggested for correlation and confirmation. The hip joint is maintained. No significant degenerative changes. Contralateral right hip demonstrates postoperative fixation, incompletely visualized. The pelvis and sacrum appear intact. The Sacroiliac Joints and Symphysis Pubis are not displaced. SOFT TISSUES: Skin folds overlying limit examination. Vascular calcifications. IMPRESSION: 1. Possible nondisplaced fracture of the left femoral neck extending to the greater trochanteric region with slight cortical step-off. Recommend CT for confirmation. 2. Postoperative fixation in the contralateral right hip, incompletely visualized. Electronically signed by: Elsie Gravely MD 12/16/2023 05:29 PM EST RP Workstation: [id number]   CT Head Wo Contrast Result Date: 12/16/2023 CLINICAL DATA:  Fall. EXAM: CT HEAD WITHOUT CONTRAST CT CERVICAL SPINE WITHOUT CONTRAST TECHNIQUE: Multidetector CT imaging of the head and cervical spine was performed following the standard protocol without intravenous contrast. Multiplanar CT image reconstructions of the cervical spine were also generated. RADIATION DOSE REDUCTION: This exam was performed according to the departmental dose-optimization program which includes automated exposure control, adjustment of the mA and/or kV according to patient size and/or use of iterative reconstruction technique. COMPARISON:  Head and cervical CT, 09/22/2022 FINDINGS: CT HEAD FINDINGS Brain: No evidence of acute infarction, hemorrhage, hydrocephalus, extra-axial collection or mass lesion/mass effect. Vascular: No hyperdense vessel or unexpected calcification. Skull: Normal. Negative for fracture or focal lesion. Sinuses/Orbits: Globes and orbits are unremarkable. Sinuses are essentially clear. Other: None. CT CERVICAL SPINE  FINDINGS Alignment: Slight degenerative anterolisthesis of C4 on C5.  No other malalignment. Skull base and vertebrae: No acute fracture. No primary bone lesion or focal pathologic process. Soft tissues and spinal canal: No prevertebral fluid or swelling. No visible canal hematoma. Disc levels: Mild loss of disc height at C5-C6 with moderate loss of disc height at C6-C7. Facet degenerative change noted bilaterally most evident along the mid cervical spine. No significant disc bulging or convincing disc herniation. Upper chest: No acute findings. Other: None. IMPRESSION: HEAD CT 1. No acute intracranial abnormalities. CERVICAL CT 1. No fracture or acute finding. Electronically Signed   By: Alm Parkins M.D.   On: 12/16/2023 16:59   CT Cervical Spine Wo Contrast Result Date: 12/16/2023 CLINICAL DATA:  Fall. EXAM: CT HEAD WITHOUT CONTRAST CT CERVICAL SPINE WITHOUT CONTRAST TECHNIQUE: Multidetector CT imaging of the head and cervical spine was performed following the standard protocol without intravenous contrast. Multiplanar CT image reconstructions of the cervical spine were also generated. RADIATION DOSE REDUCTION: This exam was performed according to the departmental dose-optimization program which includes automated exposure control, adjustment of the mA and/or kV according to patient size and/or use of iterative reconstruction technique. COMPARISON:  Head and cervical CT, 09/22/2022 FINDINGS: CT HEAD FINDINGS Brain: No evidence of acute infarction, hemorrhage, hydrocephalus, extra-axial collection or mass lesion/mass effect. Vascular: No hyperdense vessel or unexpected calcification. Skull: Normal. Negative for fracture or focal lesion. Sinuses/Orbits: Globes and orbits are unremarkable. Sinuses are essentially clear. Other: None. CT CERVICAL SPINE FINDINGS Alignment: Slight degenerative anterolisthesis of C4 on C5. No other malalignment. Skull base and vertebrae: No acute fracture. No primary bone lesion or focal pathologic process. Soft tissues and spinal canal: No  prevertebral fluid or swelling. No visible canal hematoma. Disc levels: Mild loss of disc height at C5-C6 with moderate loss of disc height at C6-C7. Facet degenerative change noted bilaterally most evident along the mid cervical spine. No significant disc bulging or convincing disc herniation. Upper chest: No acute findings. Other: None. IMPRESSION: HEAD CT 1. No acute intracranial abnormalities. CERVICAL CT 1. No fracture or acute finding. Electronically Signed   By: Alm Parkins M.D.   On: 12/16/2023 16:59      Time coordinating discharge: over 30 minutes  SIGNED:  Samanth Mirkin DO [facility]

## 2023-12-21 NOTE — Progress Notes (Signed)
 Physical Therapy Treatment Patient Details Name: Rhonda Fields MRN: [id number] DOB: 1950/05/02 Today's Date: 12/21/2023   History of Present Illness Pt is a 73 y/o F admitted on 12/16/23 after presenting with c/o fall, L hip pain. Pt found to have L intertrochanteric femur fx. Pt is s/p ORIF on 12/17/23. PMH: HTN, HLD, DM, depression with anxiety, CKD 2, IBS, HOH, HA, deficiency anemia    PT Comments  Pt ready for session.  Supine AAROM prior to OOB for comfort.  She struggles but is able to make to to EOB on her own with light min a x 1 for safety using bed features.  Steady in sitting but some nausea noted.  Seated AROM prior to standing with RW x 2 with min a x 1.  AROM and sidesteps up to [facility] to reposition.  Refuses OOB to chair No.  She returns to supine with mod a x 1 for LE's and min a to reposition in bed.  Pt requesting pain medication at end of session and communicated request with RN.  HEP reviewed and is encouraged to do 3-4 times during the day in supine.    Pt continues to be far from baseline.  Self limiting and self directing therapy sessions.  She will benefit from continued therapies at discharge < 3hrs a day.  Will dec to 7x per week at this time as pt is getting OOB with nursing to use BSC.   If plan is discharge home, recommend the following: A lot of help with walking and/or transfers;A lot of help with bathing/dressing/bathroom;Assist for transportation;Assistance with cooking/housework;Help with stairs or ramp for entrance   Can travel by private vehicle        Equipment Recommendations  None recommended by PT    Recommendations for Other Services       Precautions / Restrictions Precautions Precautions: Fall Restrictions Weight Bearing Restrictions Per Provider Order: Yes LLE Weight Bearing Per Provider Order: Weight bearing as tolerated     Mobility  Bed Mobility Overal bed mobility: Needs Assistance Bed Mobility: Supine to Sit, Sit to Supine      Supine to sit: Min assist, HOB elevated, Used rails Sit to supine: Mod assist, HOB elevated, Used rails   General bed mobility comments: inc time to complete but wants to try on her own Patient Response: Cooperative  Transfers Overall transfer level: Needs assistance Equipment used: Rolling walker (2 wheels) Transfers: Sit to/from Stand Sit to Stand: Min assist, From elevated surface                Ambulation/Gait Ambulation/Gait assistance: Contact guard assist, Min assist Gait Distance (Feet): 3 Feet Assistive device: Rolling walker (2 wheels) Gait Pattern/deviations: Decreased step length - right, Decreased step length - left, Decreased stride length, Decreased dorsiflexion - left, Decreased weight shift to left Gait velocity: decreased     General Gait Details: self limites to side steps along EOB   Stairs             Wheelchair Mobility     Tilt Bed Tilt Bed Patient Response: Cooperative  Modified Rankin (Stroke Patients Only)       Balance Overall balance assessment: Needs assistance Sitting-balance support: Feet supported Sitting balance-Leahy Scale: Fair     Standing balance support: During functional activity, Bilateral upper extremity supported, Reliant on assistive device for balance Standing balance-Leahy Scale: Poor Standing balance comment: heavy reliance on RW for support  Communication Communication Communication: Impaired Factors Affecting Communication: Hearing impaired  Cognition Arousal: Alert Behavior During Therapy: Anxious, WFL for tasks assessed/performed   PT - Cognitive impairments: No apparent impairments                         Following commands: Impaired Following commands impaired: Follows one step commands with increased time    Cueing Cueing Techniques: Verbal cues, Tactile cues  Exercises Other Exercises Other Exercises: supine AAROM in supine and AROM in  sitting and standing    General Comments        Pertinent Vitals/Pain Pain Assessment Pain Assessment: Faces Faces Pain Scale: Hurts even more Pain Location: L hip with movement Pain Descriptors / Indicators: Cramping, Sore, Sharp Pain Intervention(s): Limited activity within patient's tolerance, Monitored during session, Repositioned, Patient requesting pain meds-RN notified    Home Living                          Prior Function            PT Goals (current goals can now be found in the care plan section) Progress towards PT goals: Progressing toward goals    Frequency    7X/week      PT Plan      Co-evaluation              AM-PAC PT 6 Clicks Mobility   Outcome Measure  Help needed turning from your back to your side while in a flat bed without using bedrails?: A Little Help needed moving from lying on your back to sitting on the side of a flat bed without using bedrails?: A Lot Help needed moving to and from a bed to a chair (including a wheelchair)?: A Little Help needed standing up from a chair using your arms (e.g., wheelchair or bedside chair)?: A Little Help needed to walk in hospital room?: A Little Help needed climbing 3-5 steps with a railing? : Total 6 Click Score: 15    End of Session Equipment Utilized During Treatment: Gait belt Activity Tolerance: Patient limited by pain Patient left: in bed;with call bell/phone within reach;with bed alarm set;with family/visitor present Nurse Communication: Mobility status PT Visit Diagnosis: Muscle weakness (generalized) (M62.81);Pain;Difficulty in walking, not elsewhere classified (R26.2);Other abnormalities of gait and mobility (R26.89) Pain - Right/Left: Left Pain - part of body: Hip     Time: 9164-9145 PT Time Calculation (min) (ACUTE ONLY): 19 min  Charges:    $Therapeutic Exercise: 8-22 mins PT General Charges $$ ACUTE PT VISIT: 1 Visit                   Lauraine Gills,  PTA 12/21/23, 9:39 AM

## 2023-12-31 ENCOUNTER — Ambulatory Visit: Payer: Self-pay | Admitting: Family Medicine

## 2023-12-31 NOTE — Telephone Encounter (Signed)
 FYI Only or Action Required?: Action required by provider: Med request.  Patient was last seen in primary care on 10/28/2023 by Duanne Butler DASEN, MD.  Called Nurse Triage reporting Hip Injury.  Symptoms began several days ago.  Interventions attempted: Nothing.  Symptoms are: stable.  Triage Disposition: See PCP When Office is Open (Within 3 Days)  Patient/caregiver understands and will follow disposition?: No, wishes to speak with PCP Reason for Disposition  [1] After 2 weeks AND [2] still painful or swollen  Answer Assessment - Initial Assessment Questions Patient reports being in a rehab facility currently, discharge is happening Monday afternoon or Tuesday morning. Patient states when this happened last year on her R hip, the same swelling and spasms happened and PCP gave 2 medications. Patient is requesting furosemide  (LASIX ) 40 MG and methocarbamol  (ROBAXIN ) 500 MG be sent to [facility] on file for delivery. Please advise  1. MECHANISM: How did the injury happen? (e.g., twisting injury, direct blow)      Patient fell, went to ED and was admitted and got surgery  2. ONSET: When did the injury happen? (e.g., minutes, hours ago)      12/16/23  3. LOCATION: Where is the injury located?      Left hip  4. SIZE: For cuts, bruises, or swelling, ask: How large is it? (e.g., inches or centimeters;  entire joint)      Some bruising  5. OTHER SYMPTOMS: Do you have any other symptoms?      Swelling in ankle and calf and muscle spasms. Patient reports some pain, patient reports staff is aware and they stated when the doctor can see her he will, which is early next week.  Protocols used: Hip Injury-A-AH  Copied from CRM #[id number]. Topic: Clinical - Red Word Triage >> Dec 31, 2023  8:23 AM Revonda D wrote: Red Word that prompted transfer to Nurse Triage: swelling, pain   Pt stated that she fell on Thanksgiving and hit her left hip. Pt stated that she is now  experiencing swelling and painful muscle spasms in her left leg and foot. Pt stated that she had this same issue last year with her right hip and wants to request a prescription refill for the furosemide  (LASIX ) 40 MG tablet and methocarbamol  (ROBAXIN ) 500 MG tablet.

## 2024-01-03 ENCOUNTER — Other Ambulatory Visit: Payer: Self-pay | Admitting: Family Medicine

## 2024-01-03 ENCOUNTER — Ambulatory Visit

## 2024-01-03 DIAGNOSIS — Z09 Encounter for follow-up examination after completed treatment for conditions other than malignant neoplasm: Secondary | ICD-10-CM

## 2024-01-03 DIAGNOSIS — S72141A Displaced intertrochanteric fracture of right femur, initial encounter for closed fracture: Secondary | ICD-10-CM

## 2024-01-03 DIAGNOSIS — S72142D Displaced intertrochanteric fracture of left femur, subsequent encounter for closed fracture with routine healing: Secondary | ICD-10-CM

## 2024-01-03 MED ORDER — METHOCARBAMOL 500 MG PO TABS: 500.0000 mg | ORAL_TABLET | Freq: Three times a day (TID) | ORAL | 0 refills | Status: AC | PRN

## 2024-01-03 MED ORDER — FUROSEMIDE 40 MG PO TABS: ORAL_TABLET | ORAL | 3 refills | Status: AC

## 2024-01-03 NOTE — Progress Notes (Signed)
 Orthopedic Follow-Up Note  2 weeks status post left intertrochanteric femur fracture with cephalomedullary nail DOI: 12/16/2023 DOS: 12/17/2023   SUBJECTIVE:   Rhonda Fields is a 73 y.o. female who presents to [facility] 2 weeks status post left intertrochanteric femur fracture with cephalomedullary nail.  Mechanism of injury was a fall in gas station bathroom at [place].  Patient presented to ED, was admitted, and underwent cephalomedullary nail of the left femur the following day.  Patient was discharged from hospital on 12/21/2023 to a rehab facility.  She reports that she is still residing at a rehab facility.  She is due to be discharged home later this week.  Patient reports she does have assistance at home.  Overall, patient reports she is doing well.  She does report some discomfort in the left thigh.  Pain is manageable with medication.  Denies increasing pain, numbness or paresthesias in the left lower extremity.  Denies drainage from incision sites.  Denies fever or chills.  No other complaints at this time.  Patient has been receiving PT/OT at her facility.  Patient has been starting ambulation with assist. Patient currently on DVT prophylaxis, aspirin  81 mg twice daily.  Patient using Tylenol  and Robaxin  for pain management.     Past Medical History:  Diagnosis Date   Anxiety    Arrhythmia    Chronic headaches    Conjunctivitis 10/31/2012   Depression    Diabetes mellitus without complication (HCC)    Generalized abdominal pain 04/29/2011   Generalized headaches 04/08/2011   GERD (gastroesophageal reflux disease)    Hard of hearing    IBS (irritable bowel syndrome) 01/20/1984   Past Surgical History:  Procedure Laterality Date   ABDOMINAL HYSTERECTOMY     CHOLECYSTECTOMY     ERCP  04/20/2011   Procedure: ENDOSCOPIC RETROGRADE CHOLANGIOPANCREATOGRAPHY (ERCP);  Surgeon: Lamar JONETTA Aho, MD;  Location: [facility];  Service: Endoscopy;   Laterality: N/A;  case is at 1430 in or   ERCP  04/20/2011   Procedure: ENDOSCOPIC RETROGRADE CHOLANGIOPANCREATOGRAPHY (ERCP);  Surgeon: Lamar JONETTA Aho, MD;  Location: [facility];  Service: Gastroenterology;  Laterality: N/A;   INTRAMEDULLARY (IM) NAIL INTERTROCHANTERIC Right 09/23/2022   Procedure: INTRAMEDULLARY (IM) NAIL INTERTROCHANTERIC;  Surgeon: Jerri Kay HERO, MD;  Location: [facility];  Service: Orthopedics;  Laterality: Right;   INTRAMEDULLARY (IM) NAIL INTERTROCHANTERIC Left 12/17/2023   Procedure: FIXATION, FRACTURE, INTERTROCHANTERIC, WITH INTRAMEDULLARY ROD;  Surgeon: Gust Molly, DO;  Location: [facility];  Service: Orthopedics;  Laterality: Left;   VENTRAL HERNIA REPAIR N/A 10/15/2022   Procedure: HERNIA REPAIR VENTRAL ADULT;  Surgeon: Marinda Jayson KIDD, MD;  Location: [facility];  Service: General;  Laterality: N/A;   Current Medications[1] Allergies[2] Social History   Socioeconomic History   Marital status: Widowed    Spouse name: Not on file   Number of children: 4   Years of education: Not on file   Highest education level: Not on file  Occupational History   Occupation: Unemployed  Tobacco Use   Smoking status: Former    Current packs/day: 1.00    Average packs/day: 1 pack/day for 40.0 years (40.0 ttl pk-yrs)    Types: Cigarettes   Smokeless tobacco: Never  Substance and Sexual Activity   Alcohol use: No   Drug use: No   Sexual activity: Not Currently  Other Topics Concern   Not on file  Social History Narrative   Daughter lives next door.   Widowed. Was married x 37 years.  Social Drivers of Health   Tobacco Use: Medium Risk (10/28/2023)   Patient History    Smoking Tobacco Use: Former    Smokeless Tobacco Use: Never    Passive Exposure: Not on file  Financial Resource Strain: Low Risk (06/02/2023)   Overall Financial Resource Strain (CARDIA)    Difficulty of Paying Living Expenses: Not hard at all  Food Insecurity: No Food Insecurity (12/17/2023)   Epic     Worried About Programme Researcher, Broadcasting/film/video in the Last Year: Never true    Ran Out of Food in the Last Year: Never true  Transportation Needs: Unmet Transportation Needs (12/17/2023)   Epic    Lack of Transportation (Medical): Yes    Lack of Transportation (Non-Medical): Yes  Physical Activity: Inactive (06/02/2023)   Exercise Vital Sign    Days of Exercise per Week: 0 days    Minutes of Exercise per Session: 0 min  Stress: No Stress Concern Present (06/02/2023)   [employer] of Occupational Health - Occupational Stress Questionnaire    Feeling of Stress : Only a little  Social Connections: Socially Isolated (12/17/2023)   Social Connection and Isolation Panel    Frequency of Communication with Friends and Family: More than three times a week    Frequency of Social Gatherings with Friends and Family: More than three times a week    Attends Religious Services: Never    Database Administrator or Organizations: No    Attends Banker Meetings: Never    Marital Status: Widowed  Intimate Partner Violence: Not At Risk (12/17/2023)   Epic    Fear of Current or Ex-Partner: No    Emotionally Abused: No    Physically Abused: No    Sexually Abused: No  Depression (PHQ2-9): Low Risk (06/02/2023)   Depression (PHQ2-9)    PHQ-2 Score: 0  Alcohol Screen: Low Risk (06/02/2023)   Alcohol Screen    Last Alcohol Screening Score (AUDIT): 0  Housing: Low Risk (12/17/2023)   Epic    Unable to Pay for Housing in the Last Year: No    Number of Times Moved in the Last Year: 0    Homeless in the Last Year: No  Utilities: At Risk (12/17/2023)   Epic    Threatened with loss of utilities: Yes  Health Literacy: Adequate Health Literacy (06/02/2023)   B1300 Health Literacy    Frequency of need for help with medical instructions: Never   Family History  Adopted: Yes  Problem Relation Age of Onset   Diabetes Other        Siblings, both sets of grandparents   Liver disease Mother    Kidney  disease Mother    Liver disease Brother    Malignant hyperthermia Neg Hx      ROS: A review of systems was performed and is negative unless stated above in HPI    OBJECTIVE:    Constitutional:   The patient is alert and oriented x 3, appears to be stated age and in no distress.   Orthopaedic Examination:   Left lower extremity:  Surgical dressing intact over the left lower extremity.  Dressings were removed for examination.  Skin staples intact overlying lateral thigh incision sites.  No drainage or concern for infection.  No palpable fluctuance.  No erythema or ecchymosis.  Mild tenderness to palpation overlying incision sites.  After skin staples were removed, no drainage expressible from incision sites. While seated, patient is able to tolerate short arc of  motion about the left hip without pain.  While supine, patient is able to flex at the hip and knee with minimal discomfort over the left thigh. Pain-free log roll. Pain-free passive short arc ROM of hip, knee, ankle.  Calf nontender with palpation.  Palpable dorsalis pedis, foot warm and well perfused. Sensation intact to light touch to Deep peroneal/Superficial peroneal/Tibial/Saphenous/Sural nerve distributions. Motor function intact to EHL/FHL/Tibialis anterior/Gastrocnemius. Compartments soft and compressible     IMAGING:   Xrays: AP and lateral x-rays of the left femur were obtained in office and reviewed with patient.  Per my interpretation, there are no new peri-implant fractures.  In comparison to intraoperative and immediate postoperative radiographs, no change in implant or fracture alignment.   Radiographic findings reviewed with patient     ASSESSMENT:  2 weeks status post left intertrochanteric femur fracture with intramedullary nail     PLAN:   Patient was seen in office today, 2 weeks status post left intertrochanteric femur fracture with intramedullary nail.  Overall, patient is doing well.  She is  currently residing at nursing facility.  She is due to go home later this week where she will have assistance.  She reports that she already has therapy arranged for home.  Hip fracture rehab protocol provided for patient today.  Encouraged patient to provide this protocol to physical therapy to begin once home therapy has been initiated. Incision sites appear to be healing well.  Skin staples were removed in office and Steri-Strips were placed over the incision sites.  Instructed patient to keep Steri-Strips in place and allow them to fall off over time.  Caution patient if she were develop signs and symptoms concerning for infection including fever and chills, increasing pain over the left thigh or increased drainage from incision sites, she is to notify the office right away or go to the emergency department for evaluation.  Also discussed with patient to continue taking aspirin  81 mg twice daily for DVT prophylaxis.  This should be continued for 6-week duration.  Patient also states that her Robaxin  medication is being managed by her primary care doctor.  Instructed patient to take Tylenol  as needed for pain.   Patient will follow-up in clinic in 4 weeks for repeat evaluation and radiographs.  She may call the office sooner if needed.  All questions and concerns were addressed to the best my ability.   Arlyss Schneider, DO Orthopedic Surgery & Sports Medicine [facility]     [1]  Current Outpatient Medications:    acetaminophen  (TYLENOL ) 325 MG tablet, Take 2 tablets (650 mg total) by mouth every 6 (six) hours as needed for mild pain (pain score 1-3), fever or headache., Disp: , Rfl:    [Paused] Alcohol Swabs  (DROPSAFE ALCOHOL PREP) 70 % PADS, USE AS DIRECTED PRIOR TO MONITORING BLOOD GLUCOSE UP TO THREE TIMES DAILY, Disp: 300 each, Rfl: 3   alendronate  (FOSAMAX ) 70 MG tablet, TAKE 1 TABLET EVERY 7 DAYS WITH A FULL GLASS OF WATER ON AN EMPTY STOMACH, Disp: 12 tablet, Rfl: 3   ALPRAZolam   (XANAX ) 0.5 MG tablet, Take 1 tablet (0.5 mg total) by mouth 2 (two) times daily as needed for anxiety., Disp: 10 tablet, Rfl: 0   alum & mag hydroxide-simeth (MAALOX/MYLANTA) 200-200-20 MG/5ML suspension, Take 30 mLs by mouth every 4 (four) hours as needed for indigestion or heartburn., Disp: , Rfl:    Ascorbic Acid (VITAMIN C PO), Take 1 tablet by mouth daily., Disp: , Rfl:  aspirin  EC 81 MG tablet, Take 1 tablet (81 mg total) by mouth in the morning and at bedtime. Swallow whole., Disp: 90 tablet, Rfl:    atorvastatin  (LIPITOR) 40 MG tablet, TAKE 1 TABLET EVERY DAY, Disp: 90 tablet, Rfl: 1   [Paused] Blood Glucose Calibration (TRUE METRIX LEVEL 1) Low SOLN, Use as directed to monitor FSBS 1x daily. Dx: E11.9, Disp: 100 each, Rfl: 3   [Paused] Blood Glucose Monitoring Suppl (TRUE METRIX METER) w/Device KIT, USE AS DIRECTED, Disp: 1 kit, Rfl: 3   Calcium  Carb-Cholecalciferol (CALCIUM  + VITAMIN D3 PO), Take 1 tablet by mouth daily., Disp: , Rfl:    docusate sodium  (COLACE) 100 MG capsule, Take 1 capsule (100 mg total) by mouth 2 (two) times daily as needed for mild constipation., Disp: , Rfl:    FEROSUL 325 (65 Fe) MG tablet, TAKE 1 TABLET EVERY DAY (Patient not taking: Reported on 12/17/2023), Disp: 90 tablet, Rfl: 3   FLUoxetine  (PROZAC ) 20 MG capsule, TAKE 1 CAPSULE EVERY DAY, Disp: 90 capsule, Rfl: 3   fluticasone  (FLONASE ) 50 MCG/ACT nasal spray, USE 2 SPRAYS IN EACH NOSTRIL EVERY DAY, Disp: 48 g, Rfl: 3   furosemide  (LASIX ) 40 MG tablet, TAKE 1 TABLET EVERY DAY AS NEEDED FOR EDEMA, Disp: 90 tablet, Rfl: 3   [Paused] glucose blood test strip, Use as instructed, Disp: 100 each, Rfl: 3   HYDROcodone -acetaminophen  (NORCO) 5-325 MG tablet, Take 1-2 tablets by mouth every 6 (six) hours as needed for moderate pain (pain score 4-6) or severe pain (pain score 7-10)., Disp: 30 tablet, Rfl: 0   lidocaine  (LIDODERM ) 5 %, Place 1 patch onto the skin daily. Remove & Discard patch within 12 hours or as  directed by MD, Disp: , Rfl:    losartan  (COZAAR ) 25 MG tablet, TAKE 1 TABLET EVERY DAY (Patient not taking: Reported on 12/17/2023), Disp: 90 tablet, Rfl: 1   metFORMIN  (GLUCOPHAGE ) 1000 MG tablet, TAKE 1 TABLET TWICE DAILY WITH MEALS, Disp: 180 tablet, Rfl: 3   methocarbamol  (ROBAXIN ) 500 MG tablet, Take 1 tablet (500 mg total) by mouth every 8 (eight) hours as needed for muscle spasms., Disp: 30 tablet, Rfl: 0   Multiple Vitamins-Minerals (ZINC PO), Take 1 tablet by mouth daily., Disp: , Rfl:    Omega-3 Fatty Acids (FISH OIL PO), Take 2,000 mg by mouth daily., Disp: , Rfl:    ondansetron  (ZOFRAN -ODT) 4 MG disintegrating tablet, Take 1 tablet (4 mg total) by mouth every 6 (six) hours as needed for nausea. (Patient not taking: Reported on 12/17/2023), Disp: 20 tablet, Rfl: 0   pantoprazole  (PROTONIX ) 40 MG tablet, TAKE 1 TABLET TWICE DAILY, Disp: 180 tablet, Rfl: 3   pioglitazone  (ACTOS ) 30 MG tablet, TAKE 1 TABLET EVERY DAY, Disp: 90 tablet, Rfl: 3   polyethylene glycol (MIRALAX  / GLYCOLAX ) 17 g packet, Take 17 g by mouth daily as needed for moderate constipation or severe constipation. (Patient not taking: Reported on 10/28/2023), Disp: , Rfl:    senna-docusate (SENOKOT-S) 8.6-50 MG tablet, Take 2 tablets by mouth at bedtime as needed for mild constipation., Disp: , Rfl:    triamcinolone  cream (KENALOG ) 0.1 %, Apply 1 Application topically 2 (two) times daily as needed (skin irritation, itching)., Disp: , Rfl:    [Paused] TRUEplus Lancets 33G MISC, Use as directed to monitor FSBS 1x daily. Dx: E11.9, Disp: 100 each, Rfl: 3 [2]  Allergies Allergen Reactions   Ivp Dye [Iodinated Contrast Media] Itching and Nausea And Vomiting   Mobic [Meloxicam] Other (  See Comments)    Lower extremity swelling   Penicillins Hives and Itching

## 2024-01-11 ENCOUNTER — Inpatient Hospital Stay: Admitting: Family Medicine

## 2024-01-25 DIAGNOSIS — S72142D Displaced intertrochanteric fracture of left femur, subsequent encounter for closed fracture with routine healing: Secondary | ICD-10-CM

## 2024-01-25 DIAGNOSIS — S72141A Displaced intertrochanteric fracture of right femur, initial encounter for closed fracture: Secondary | ICD-10-CM

## 2024-01-25 NOTE — Addendum Note (Signed)
 Addended by: VANDERBILT PLOWMAN L on: 01/25/2024 11:03 AM   Modules accepted: Orders

## 2024-01-25 NOTE — Telephone Encounter (Signed)
 Referral placed in chart. Can you please get this set up asap

## 2024-01-25 NOTE — Telephone Encounter (Signed)
 I talked to the pt. She said she hasn't had home therapy since she has returned home. Do you want me to get this ordered or wait until you see her on monday? She wants to use [facility] for therapy

## 2024-01-26 NOTE — Telephone Encounter (Signed)
 Done

## 2024-01-31 ENCOUNTER — Other Ambulatory Visit

## 2024-01-31 ENCOUNTER — Ambulatory Visit

## 2024-01-31 ENCOUNTER — Telehealth: Payer: Self-pay

## 2024-01-31 DIAGNOSIS — S72142A Displaced intertrochanteric fracture of left femur, initial encounter for closed fracture: Secondary | ICD-10-CM | POA: Diagnosis not present

## 2024-01-31 DIAGNOSIS — S72142D Displaced intertrochanteric fracture of left femur, subsequent encounter for closed fracture with routine healing: Secondary | ICD-10-CM

## 2024-01-31 DIAGNOSIS — S72141A Displaced intertrochanteric fracture of right femur, initial encounter for closed fracture: Secondary | ICD-10-CM

## 2024-01-31 NOTE — Progress Notes (Signed)
 Orthopedic Follow-Up Note  6 weeks status post left intertrochanteric femur fracture with cephalomedullary nail  DOI: 12/16/2023 DOS: 12/17/2023    SUBJECTIVE:   Rhonda Fields is a 74 y.o. year old who presents for 4 week follow up of left intertrochanteric femur fracture with cephalomedullary nail. She was last seen in office on 01/03/24. Since last encounter, patient has transitioned from SNF to home. She is currently awaiting home health physical therapy to begin.  Overall, patient has been progressing well.  Her pain has decreased significantly since last encounter.  Patient will occasionally take Tylenol  and Robaxin  as needed.  She has transition from wheeled walker to a cane within the last week.  Patient is ambulating with minimal difficulty.  She is able to navigate stairs.  She does report some intermittent pain in her inner thigh as well as stiffness when moving from a seated to standing position.  She denies fever or chills, paresthesias or numbness in the left lower extremity or increasing pain today.  No other complaints per patient.  Patient is accompanied by her daughter today.     Past Medical History:  Diagnosis Date   Anxiety    Arrhythmia    Chronic headaches    Conjunctivitis 10/31/2012   Depression    Diabetes mellitus without complication (HCC)    Generalized abdominal pain 04/29/2011   Generalized headaches 04/08/2011   GERD (gastroesophageal reflux disease)    Hard of hearing    IBS (irritable bowel syndrome) 01/20/1984   Past Surgical History:  Procedure Laterality Date   ABDOMINAL HYSTERECTOMY     CHOLECYSTECTOMY     ERCP  04/20/2011   Procedure: ENDOSCOPIC RETROGRADE CHOLANGIOPANCREATOGRAPHY (ERCP);  Surgeon: Lamar JONETTA Aho, MD;  Location: [facility];  Service: Endoscopy;  Laterality: N/A;  case is at 1430 in or   ERCP  04/20/2011   Procedure: ENDOSCOPIC RETROGRADE CHOLANGIOPANCREATOGRAPHY (ERCP);  Surgeon: Lamar JONETTA Aho, MD;  Location: [facility];   Service: Gastroenterology;  Laterality: N/A;   INTRAMEDULLARY (IM) NAIL INTERTROCHANTERIC Right 09/23/2022   Procedure: INTRAMEDULLARY (IM) NAIL INTERTROCHANTERIC;  Surgeon: Jerri Kay HERO, MD;  Location: [facility];  Service: Orthopedics;  Laterality: Right;   INTRAMEDULLARY (IM) NAIL INTERTROCHANTERIC Left 12/17/2023   Procedure: FIXATION, FRACTURE, INTERTROCHANTERIC, WITH INTRAMEDULLARY ROD;  Surgeon: Gust Molly, DO;  Location: [facility];  Service: Orthopedics;  Laterality: Left;   VENTRAL HERNIA REPAIR N/A 10/15/2022   Procedure: HERNIA REPAIR VENTRAL ADULT;  Surgeon: Marinda Jayson KIDD, MD;  Location: [facility];  Service: General;  Laterality: N/A;   Current Medications[1] Allergies[2] Social History   Socioeconomic History   Marital status: Widowed    Spouse name: Not on file   Number of children: 4   Years of education: Not on file   Highest education level: Not on file  Occupational History   Occupation: Unemployed  Tobacco Use   Smoking status: Former    Current packs/day: 1.00    Average packs/day: 1 pack/day for 40.0 years (40.0 ttl pk-yrs)    Types: Cigarettes   Smokeless tobacco: Never  Substance and Sexual Activity   Alcohol use: No   Drug use: No   Sexual activity: Not Currently  Other Topics Concern   Not on file  Social History Narrative   Daughter lives next door.   Widowed. Was married x 37 years.   Social Drivers of Health   Tobacco Use: Medium Risk (01/03/2024)   Patient History    Smoking Tobacco Use: Former    Smokeless Tobacco  Use: Never    Passive Exposure: Not on file  Financial Resource Strain: Low Risk (06/02/2023)   Overall Financial Resource Strain (CARDIA)    Difficulty of Paying Living Expenses: Not hard at all  Food Insecurity: No Food Insecurity (12/17/2023)   Epic    Worried About Programme Researcher, Broadcasting/film/video in the Last Year: Never true    Ran Out of Food in the Last Year: Never true  Transportation Needs: Unmet Transportation Needs (12/17/2023)    Epic    Lack of Transportation (Medical): Yes    Lack of Transportation (Non-Medical): Yes  Physical Activity: Inactive (06/02/2023)   Exercise Vital Sign    Days of Exercise per Week: 0 days    Minutes of Exercise per Session: 0 min  Stress: No Stress Concern Present (06/02/2023)   [employer] of Occupational Health - Occupational Stress Questionnaire    Feeling of Stress : Only a little  Social Connections: Socially Isolated (12/17/2023)   Social Connection and Isolation Panel    Frequency of Communication with Friends and Family: More than three times a week    Frequency of Social Gatherings with Friends and Family: More than three times a week    Attends Religious Services: Never    Database Administrator or Organizations: No    Attends Banker Meetings: Never    Marital Status: Widowed  Intimate Partner Violence: Not At Risk (12/17/2023)   Epic    Fear of Current or Ex-Partner: No    Emotionally Abused: No    Physically Abused: No    Sexually Abused: No  Depression (PHQ2-9): Low Risk (06/02/2023)   Depression (PHQ2-9)    PHQ-2 Score: 0  Alcohol Screen: Low Risk (06/02/2023)   Alcohol Screen    Last Alcohol Screening Score (AUDIT): 0  Housing: Low Risk (12/17/2023)   Epic    Unable to Pay for Housing in the Last Year: No    Number of Times Moved in the Last Year: 0    Homeless in the Last Year: No  Utilities: At Risk (12/17/2023)   Epic    Threatened with loss of utilities: Yes  Health Literacy: Adequate Health Literacy (06/02/2023)   B1300 Health Literacy    Frequency of need for help with medical instructions: Never   Family History  Adopted: Yes  Problem Relation Age of Onset   Diabetes Other        Siblings, both sets of grandparents   Liver disease Mother    Kidney disease Mother    Liver disease Brother    Malignant hyperthermia Neg Hx      ROS: A review of systems was performed and is negative unless stated above in HPI    OBJECTIVE:     Constitutional:   The patient is alert and oriented x 3, appears to be stated age and in no distress.   Orthopaedic Examination:   Left lower extremity:   On examination of the left lateral thigh, 3 surgical incision sites are well-healed.  No concern for infection.  No palpable fluctuance.  No erythema or ecchymosis. No prominent hardware.  Mild sensitivity to light touch overlying the incision sites.  While seated, patient is able to tolerate arc of motion about the left hip without pain.  While supine, patient is able to flex at the hip and knee with no discomfort. Pain-free log roll.  Calf nontender with palpation.  Palpable dorsalis pedis, foot warm and well perfused. Sensation intact to light touch  to Deep peroneal/Superficial peroneal/Tibial/Saphenous/Sural nerve distributions. Motor function intact to EHL/FHL/Tibialis anterior/Gastrocnemius. Compartments soft and compressible    IMAGING:   Xrays: AP and lateral x-rays of the left femur were obtained in office today and reviewed with patient.  Per my interpretation, there are no new peri-implant fractures.  In comparison to prior radiographs, slight increased lateral prominence of lag screw with interval compression noted at the fracture site with visible callus formation.        ASSESSMENT:  6 weeks status post left intertrochanteric femur fracture with cephalomedullary nail fixation - doing well overall     PLAN:   Patient was seen in office today for 4-week follow-up status post left intertrochanteric femur fracture with cephalomedullary nail.  Patient is now 6 weeks status post surgery.  Overall, patient is doing very well.  She has transitioned from nursing facility to home.  Patient has successfully transitioned from wheeled walker to cane within the last week.  She has noticed significant improvement in her ambulation and navigating stairs. Pain level continues to improve.  Clinically, patient does have good strength and  and range of motion within the left hip with minimal pain.  Incisions are well-healed.  Radiographically, no peri-implant fractures noted.  Slight increased prominence of lag screw laterally secondary to fracture site continuing to compress with development of callus.  No palpable hardware prominence about the lateral hip and no subjective lateral based hip pain complaints per patient.    Since transitioning home, patient has not worked with physical therapy.  We discussed continuation of physical therapy at this juncture.  Order has been placed for home physical therapy to begin within the near future.  Patient has completed prescribed dose of aspirin  81 mg twice daily for DVT prophylaxis.  She does take aspirin  at baseline which she will continue per her medical doctor recommendations.  I did remind patient today that I will be transitioning out of my role within the near future. In order to provide appropriate continuity of care, patient will continue post-operative follow up with my partner at [facility]. Patient to follow up in 6 weeks with Dr. Mariah.  Patient may reach out to our office if there are any difficulties in scheduling follow up care. The patient understands who to contact for future orthopedic concerns and has contact information for the receiving practice.    Arlyss Schneider, DO Orthopedic Surgery & Sports Medicine [facility]      [1]  Current Outpatient Medications:    acetaminophen  (TYLENOL ) 325 MG tablet, Take 2 tablets (650 mg total) by mouth every 6 (six) hours as needed for mild pain (pain score 1-3), fever or headache., Disp: , Rfl:    [Paused] Alcohol Swabs  (DROPSAFE ALCOHOL PREP) 70 % PADS, USE AS DIRECTED PRIOR TO MONITORING BLOOD GLUCOSE UP TO THREE TIMES DAILY, Disp: 300 each, Rfl: 3   alendronate  (FOSAMAX ) 70 MG tablet, TAKE 1 TABLET EVERY 7 DAYS WITH A FULL GLASS OF WATER ON AN EMPTY STOMACH, Disp: 12 tablet, Rfl: 3   ALPRAZolam  (XANAX ) 0.5 MG  tablet, Take 1 tablet (0.5 mg total) by mouth 2 (two) times daily as needed for anxiety., Disp: 10 tablet, Rfl: 0   alum & mag hydroxide-simeth (MAALOX/MYLANTA) 200-200-20 MG/5ML suspension, Take 30 mLs by mouth every 4 (four) hours as needed for indigestion or heartburn., Disp: , Rfl:    Ascorbic Acid (VITAMIN C PO), Take 1 tablet by mouth daily., Disp: , Rfl:    aspirin  EC 81 MG  tablet, Take 1 tablet (81 mg total) by mouth in the morning and at bedtime. Swallow whole., Disp: 90 tablet, Rfl:    atorvastatin  (LIPITOR) 40 MG tablet, TAKE 1 TABLET EVERY DAY, Disp: 90 tablet, Rfl: 1   [Paused] Blood Glucose Calibration (TRUE METRIX LEVEL 1) Low SOLN, Use as directed to monitor FSBS 1x daily. Dx: E11.9, Disp: 100 each, Rfl: 3   [Paused] Blood Glucose Monitoring Suppl (TRUE METRIX METER) w/Device KIT, USE AS DIRECTED, Disp: 1 kit, Rfl: 3   Calcium  Carb-Cholecalciferol (CALCIUM  + VITAMIN D3 PO), Take 1 tablet by mouth daily., Disp: , Rfl:    docusate sodium  (COLACE) 100 MG capsule, Take 1 capsule (100 mg total) by mouth 2 (two) times daily as needed for mild constipation., Disp: , Rfl:    FEROSUL 325 (65 Fe) MG tablet, TAKE 1 TABLET EVERY DAY (Patient not taking: Reported on 12/17/2023), Disp: 90 tablet, Rfl: 3   FLUoxetine  (PROZAC ) 20 MG capsule, TAKE 1 CAPSULE EVERY DAY, Disp: 90 capsule, Rfl: 3   fluticasone  (FLONASE ) 50 MCG/ACT nasal spray, USE 2 SPRAYS IN EACH NOSTRIL EVERY DAY, Disp: 48 g, Rfl: 3   furosemide  (LASIX ) 40 MG tablet, TAKE 1 TABLET EVERY DAY AS NEEDED FOR EDEMA, Disp: 90 tablet, Rfl: 3   [Paused] glucose blood test strip, Use as instructed, Disp: 100 each, Rfl: 3   HYDROcodone -acetaminophen  (NORCO) 5-325 MG tablet, Take 1-2 tablets by mouth every 6 (six) hours as needed for moderate pain (pain score 4-6) or severe pain (pain score 7-10)., Disp: 30 tablet, Rfl: 0   lidocaine  (LIDODERM ) 5 %, Place 1 patch onto the skin daily. Remove & Discard patch within 12 hours or as directed by MD,  Disp: , Rfl:    losartan  (COZAAR ) 25 MG tablet, TAKE 1 TABLET EVERY DAY (Patient not taking: Reported on 12/17/2023), Disp: 90 tablet, Rfl: 1   metFORMIN  (GLUCOPHAGE ) 1000 MG tablet, TAKE 1 TABLET TWICE DAILY WITH MEALS, Disp: 180 tablet, Rfl: 3   methocarbamol  (ROBAXIN ) 500 MG tablet, Take 1 tablet (500 mg total) by mouth every 8 (eight) hours as needed for muscle spasms., Disp: 30 tablet, Rfl: 0   Multiple Vitamins-Minerals (ZINC PO), Take 1 tablet by mouth daily., Disp: , Rfl:    Omega-3 Fatty Acids (FISH OIL PO), Take 2,000 mg by mouth daily., Disp: , Rfl:    ondansetron  (ZOFRAN -ODT) 4 MG disintegrating tablet, Take 1 tablet (4 mg total) by mouth every 6 (six) hours as needed for nausea. (Patient not taking: Reported on 12/17/2023), Disp: 20 tablet, Rfl: 0   pantoprazole  (PROTONIX ) 40 MG tablet, TAKE 1 TABLET TWICE DAILY, Disp: 180 tablet, Rfl: 3   pioglitazone  (ACTOS ) 30 MG tablet, TAKE 1 TABLET EVERY DAY, Disp: 90 tablet, Rfl: 3   polyethylene glycol (MIRALAX  / GLYCOLAX ) 17 g packet, Take 17 g by mouth daily as needed for moderate constipation or severe constipation. (Patient not taking: Reported on 10/28/2023), Disp: , Rfl:    senna-docusate (SENOKOT-S) 8.6-50 MG tablet, Take 2 tablets by mouth at bedtime as needed for mild constipation., Disp: , Rfl:    triamcinolone  cream (KENALOG ) 0.1 %, Apply 1 Application topically 2 (two) times daily as needed (skin irritation, itching)., Disp: , Rfl:    [Paused] TRUEplus Lancets 33G MISC, Use as directed to monitor FSBS 1x daily. Dx: E11.9, Disp: 100 each, Rfl: 3 [2]  Allergies Allergen Reactions   Ivp Dye [Iodinated Contrast Media] Itching and Nausea And Vomiting   Mobic [Meloxicam] Other (See Comments)  Lower extremity swelling   Penicillins Hives and Itching

## 2024-01-31 NOTE — Telephone Encounter (Signed)
 Pt came in today and said she hasn't heard from [facility]. Any update on referral?

## 2024-01-31 NOTE — Patient Instructions (Signed)

## 2024-02-02 ENCOUNTER — Telehealth: Payer: Self-pay

## 2024-02-02 NOTE — Telephone Encounter (Signed)
 Rhonda Fields from [facility] called to inform the Dr. Augusto a delay in home health care. The reason is she declined today ad requested tomorrow. Call back number is [phone]

## 2024-02-03 ENCOUNTER — Telehealth: Payer: Self-pay

## 2024-02-03 NOTE — Telephone Encounter (Signed)
 Called and gave verbal ok

## 2024-02-03 NOTE — Telephone Encounter (Signed)
 Darryle from [facility] called. He would like verbal orders for PT 2x 4wk, 1x wk 4wks. His cb# [phone]

## 2024-03-14 ENCOUNTER — Ambulatory Visit

## 2024-06-07 ENCOUNTER — Ambulatory Visit
